# Patient Record
Sex: Male | Born: 1935 | Race: White | Hispanic: No | Marital: Single | Smoking: Never smoker
Health system: Southern US, Community
[De-identification: ages and names within clinical notes are randomized; demographics above are authoritative.]

## PROBLEM LIST (undated history)

## (undated) DIAGNOSIS — Z8739 Personal history of other diseases of the musculoskeletal system and connective tissue: Secondary | ICD-10-CM

## (undated) DIAGNOSIS — K210 Gastro-esophageal reflux disease with esophagitis: Secondary | ICD-10-CM

## (undated) DIAGNOSIS — F1010 Alcohol abuse, uncomplicated: Secondary | ICD-10-CM

## (undated) DIAGNOSIS — R269 Unspecified abnormalities of gait and mobility: Secondary | ICD-10-CM

## (undated) DIAGNOSIS — Z950 Presence of cardiac pacemaker: Secondary | ICD-10-CM

## (undated) DIAGNOSIS — G4700 Insomnia, unspecified: Secondary | ICD-10-CM

## (undated) DIAGNOSIS — J449 Chronic obstructive pulmonary disease, unspecified: Secondary | ICD-10-CM

## (undated) DIAGNOSIS — I10 Essential (primary) hypertension: Secondary | ICD-10-CM

## (undated) DIAGNOSIS — Z9181 History of falling: Secondary | ICD-10-CM

## (undated) DIAGNOSIS — K5900 Constipation, unspecified: Secondary | ICD-10-CM

## (undated) DIAGNOSIS — L600 Ingrowing nail: Secondary | ICD-10-CM

## (undated) DIAGNOSIS — I495 Sick sinus syndrome: Secondary | ICD-10-CM

## (undated) DIAGNOSIS — K219 Gastro-esophageal reflux disease without esophagitis: Secondary | ICD-10-CM

## (undated) DIAGNOSIS — I509 Heart failure, unspecified: Secondary | ICD-10-CM

## (undated) DIAGNOSIS — M1990 Unspecified osteoarthritis, unspecified site: Secondary | ICD-10-CM

## (undated) DIAGNOSIS — H3530 Unspecified macular degeneration: Secondary | ICD-10-CM

## (undated) DIAGNOSIS — L219 Seborrheic dermatitis, unspecified: Secondary | ICD-10-CM

## (undated) DIAGNOSIS — F32A Depression, unspecified: Secondary | ICD-10-CM

## (undated) DIAGNOSIS — I4891 Unspecified atrial fibrillation: Secondary | ICD-10-CM

## (undated) DIAGNOSIS — I319 Disease of pericardium, unspecified: Secondary | ICD-10-CM

## (undated) DIAGNOSIS — F329 Major depressive disorder, single episode, unspecified: Secondary | ICD-10-CM

## (undated) DIAGNOSIS — B429 Sporotrichosis, unspecified: Secondary | ICD-10-CM

## (undated) DIAGNOSIS — M545 Low back pain, unspecified: Secondary | ICD-10-CM

## (undated) DIAGNOSIS — M6281 Muscle weakness (generalized): Secondary | ICD-10-CM

## (undated) DIAGNOSIS — D7289 Other specified disorders of white blood cells: Secondary | ICD-10-CM

## (undated) DIAGNOSIS — N400 Enlarged prostate without lower urinary tract symptoms: Secondary | ICD-10-CM

## (undated) DIAGNOSIS — E785 Hyperlipidemia, unspecified: Secondary | ICD-10-CM

## (undated) DIAGNOSIS — I471 Supraventricular tachycardia: Secondary | ICD-10-CM

## (undated) DIAGNOSIS — I2510 Atherosclerotic heart disease of native coronary artery without angina pectoris: Secondary | ICD-10-CM

## (undated) DIAGNOSIS — T7840XA Allergy, unspecified, initial encounter: Secondary | ICD-10-CM

## (undated) DIAGNOSIS — H409 Unspecified glaucoma: Secondary | ICD-10-CM

## (undated) DIAGNOSIS — M71339 Other bursal cyst, unspecified wrist: Secondary | ICD-10-CM

## (undated) HISTORY — DX: Depression, unspecified: F32.A

## (undated) HISTORY — DX: Sporotrichosis, unspecified: B42.9

## (undated) HISTORY — PX: CARPAL TUNNEL RELEASE: SHX101

## (undated) HISTORY — DX: Low back pain, unspecified: M54.50

## (undated) HISTORY — DX: Low back pain: M54.5

## (undated) HISTORY — PX: APPENDECTOMY: SHX54

## (undated) HISTORY — DX: Disease of pericardium, unspecified: I31.9

## (undated) HISTORY — PX: ELBOW SURGERY: SHX618

## (undated) HISTORY — PX: OTHER SURGICAL HISTORY: SHX169

## (undated) HISTORY — DX: Hyperlipidemia, unspecified: E78.5

## (undated) HISTORY — DX: Muscle weakness (generalized): M62.81

## (undated) HISTORY — DX: History of falling: Z91.81

## (undated) HISTORY — DX: Sick sinus syndrome: I49.5

## (undated) HISTORY — DX: Other bursal cyst, unspecified wrist: M71.339

## (undated) HISTORY — DX: Ingrowing nail: L60.0

## (undated) HISTORY — DX: Chronic obstructive pulmonary disease, unspecified: J44.9

## (undated) HISTORY — DX: Alcohol abuse, uncomplicated: F10.10

## (undated) HISTORY — PX: PACEMAKER INSERTION: SHX728

## (undated) HISTORY — DX: Atherosclerotic heart disease of native coronary artery without angina pectoris: I25.10

## (undated) HISTORY — DX: Insomnia, unspecified: G47.00

## (undated) HISTORY — DX: Constipation, unspecified: K59.00

## (undated) HISTORY — DX: Supraventricular tachycardia: I47.1

## (undated) HISTORY — DX: Presence of cardiac pacemaker: Z95.0

## (undated) HISTORY — DX: Other specified disorders of white blood cells: D72.89

## (undated) HISTORY — PX: REPLACEMENT TOTAL KNEE BILATERAL: SUR1225

## (undated) HISTORY — DX: Unspecified atrial fibrillation: I48.91

## (undated) HISTORY — DX: Seborrheic dermatitis, unspecified: L21.9

## (undated) HISTORY — DX: Unspecified macular degeneration: H35.30

## (undated) HISTORY — DX: Unspecified osteoarthritis, unspecified site: M19.90

## (undated) HISTORY — DX: Unspecified abnormalities of gait and mobility: R26.9

## (undated) HISTORY — DX: Major depressive disorder, single episode, unspecified: F32.9

## (undated) HISTORY — DX: Gastro-esophageal reflux disease without esophagitis: K21.9

## (undated) HISTORY — DX: Allergy, unspecified, initial encounter: T78.40XA

## (undated) HISTORY — DX: Gastro-esophageal reflux disease with esophagitis: K21.0

## (undated) HISTORY — DX: Essential (primary) hypertension: I10

## (undated) HISTORY — DX: Personal history of other diseases of the musculoskeletal system and connective tissue: Z87.39

## (undated) HISTORY — PX: ANGIOPLASTY: SHX39

## (undated) HISTORY — DX: Unspecified glaucoma: H40.9

## (undated) HISTORY — DX: Heart failure, unspecified: I50.9

---

## 1973-12-26 HISTORY — PX: YAG LASER APPLICATION: SHX6189

## 1976-05-19 HISTORY — PX: HERNIA REPAIR: SHX51

## 1983-07-27 HISTORY — PX: TRANSURETHRAL RESECTION OF PROSTATE: SHX73

## 1985-08-14 HISTORY — PX: CYSTOSCOPY: SUR368

## 1985-09-17 HISTORY — PX: EPIDIDYMIS SURGERY: SHX843

## 1996-02-07 HISTORY — PX: SHOULDER OPEN ROTATOR CUFF REPAIR: SHX2407

## 1996-10-30 HISTORY — PX: CATARACT EXTRACTION: SUR2

## 1999-02-24 ENCOUNTER — Ambulatory Visit (HOSPITAL_COMMUNITY): Admission: RE | Admit: 1999-02-24 | Discharge: 1999-02-24 | Payer: Self-pay | Admitting: Gastroenterology

## 1999-03-01 ENCOUNTER — Encounter: Payer: Self-pay | Admitting: Neurology

## 1999-03-01 ENCOUNTER — Encounter: Admission: RE | Admit: 1999-03-01 | Discharge: 1999-03-01 | Payer: Self-pay | Admitting: Neurology

## 1999-04-28 ENCOUNTER — Ambulatory Visit (HOSPITAL_COMMUNITY): Admission: RE | Admit: 1999-04-28 | Discharge: 1999-04-28 | Payer: Self-pay | Admitting: Neurology

## 2000-03-02 ENCOUNTER — Encounter: Payer: Self-pay | Admitting: Gastroenterology

## 2000-03-02 ENCOUNTER — Encounter: Admission: RE | Admit: 2000-03-02 | Discharge: 2000-03-02 | Payer: Self-pay | Admitting: Gastroenterology

## 2001-01-17 ENCOUNTER — Encounter: Payer: Self-pay | Admitting: Orthopedic Surgery

## 2001-01-17 ENCOUNTER — Encounter: Admission: RE | Admit: 2001-01-17 | Discharge: 2001-01-17 | Payer: Self-pay | Admitting: Orthopedic Surgery

## 2001-02-15 ENCOUNTER — Encounter: Payer: Self-pay | Admitting: Orthopedic Surgery

## 2001-02-15 ENCOUNTER — Encounter: Admission: RE | Admit: 2001-02-15 | Discharge: 2001-02-15 | Payer: Self-pay | Admitting: Orthopedic Surgery

## 2001-03-07 ENCOUNTER — Encounter: Payer: Self-pay | Admitting: Orthopedic Surgery

## 2001-03-11 ENCOUNTER — Encounter (INDEPENDENT_AMBULATORY_CARE_PROVIDER_SITE_OTHER): Payer: Self-pay

## 2001-03-11 ENCOUNTER — Encounter: Payer: Self-pay | Admitting: Orthopedic Surgery

## 2001-03-11 HISTORY — PX: BACK SURGERY: SHX140

## 2001-03-12 ENCOUNTER — Inpatient Hospital Stay (HOSPITAL_COMMUNITY): Admission: RE | Admit: 2001-03-12 | Discharge: 2001-03-13 | Payer: Self-pay | Admitting: Orthopedic Surgery

## 2001-04-30 ENCOUNTER — Ambulatory Visit (HOSPITAL_COMMUNITY): Admission: RE | Admit: 2001-04-30 | Discharge: 2001-05-01 | Payer: Self-pay | Admitting: Ophthalmology

## 2001-04-30 HISTORY — PX: YAG LASER APPLICATION: SHX6189

## 2001-05-07 ENCOUNTER — Ambulatory Visit (HOSPITAL_COMMUNITY): Admission: RE | Admit: 2001-05-07 | Discharge: 2001-05-07 | Payer: Self-pay | Admitting: Ophthalmology

## 2001-05-07 HISTORY — PX: YAG LASER APPLICATION: SHX6189

## 2001-08-26 HISTORY — PX: SIGMOIDOSCOPY: SUR1295

## 2002-08-28 ENCOUNTER — Encounter: Payer: Self-pay | Admitting: Orthopedic Surgery

## 2002-08-28 ENCOUNTER — Encounter: Admission: RE | Admit: 2002-08-28 | Discharge: 2002-08-28 | Payer: Self-pay | Admitting: Orthopedic Surgery

## 2003-04-25 HISTORY — PX: CORONARY ARTERY BYPASS GRAFT: SHX141

## 2003-04-25 HISTORY — PX: TOE SURGERY: SHX1073

## 2003-08-10 ENCOUNTER — Encounter: Admission: RE | Admit: 2003-08-10 | Discharge: 2003-08-10 | Payer: Self-pay | Admitting: Orthopedic Surgery

## 2003-08-11 ENCOUNTER — Ambulatory Visit: Admission: RE | Admit: 2003-08-11 | Discharge: 2003-08-11 | Payer: Self-pay | Admitting: Orthopedic Surgery

## 2003-08-11 ENCOUNTER — Ambulatory Visit (HOSPITAL_BASED_OUTPATIENT_CLINIC_OR_DEPARTMENT_OTHER): Admission: RE | Admit: 2003-08-11 | Discharge: 2003-08-11 | Payer: Self-pay | Admitting: Orthopedic Surgery

## 2003-08-20 ENCOUNTER — Inpatient Hospital Stay (HOSPITAL_COMMUNITY): Admission: AD | Admit: 2003-08-20 | Discharge: 2003-08-24 | Payer: Self-pay | Admitting: Gastroenterology

## 2003-08-27 ENCOUNTER — Inpatient Hospital Stay (HOSPITAL_COMMUNITY): Admission: EM | Admit: 2003-08-27 | Discharge: 2003-09-04 | Payer: Self-pay | Admitting: Emergency Medicine

## 2003-08-27 ENCOUNTER — Encounter (INDEPENDENT_AMBULATORY_CARE_PROVIDER_SITE_OTHER): Payer: Self-pay | Admitting: Cardiology

## 2003-09-28 ENCOUNTER — Ambulatory Visit (HOSPITAL_COMMUNITY): Admission: RE | Admit: 2003-09-28 | Discharge: 2003-09-28 | Payer: Self-pay | Admitting: Thoracic Surgery

## 2003-09-29 ENCOUNTER — Encounter: Admission: RE | Admit: 2003-09-29 | Discharge: 2003-09-29 | Payer: Self-pay | Admitting: Thoracic Surgery

## 2003-10-07 ENCOUNTER — Inpatient Hospital Stay (HOSPITAL_COMMUNITY): Admission: EM | Admit: 2003-10-07 | Discharge: 2003-10-15 | Payer: Self-pay | Admitting: Emergency Medicine

## 2003-10-27 ENCOUNTER — Encounter
Admission: RE | Admit: 2003-10-27 | Discharge: 2003-10-27 | Payer: Self-pay | Admitting: Thoracic Surgery (Cardiothoracic Vascular Surgery)

## 2003-11-02 ENCOUNTER — Encounter (HOSPITAL_COMMUNITY): Admission: RE | Admit: 2003-11-02 | Discharge: 2004-01-31 | Payer: Self-pay | Admitting: Interventional Cardiology

## 2005-04-24 HISTORY — PX: CARPAL TUNNEL RELEASE: SHX101

## 2005-08-03 ENCOUNTER — Encounter: Admission: RE | Admit: 2005-08-03 | Discharge: 2005-08-03 | Payer: Self-pay | Admitting: Orthopedic Surgery

## 2005-08-08 ENCOUNTER — Ambulatory Visit (HOSPITAL_BASED_OUTPATIENT_CLINIC_OR_DEPARTMENT_OTHER): Admission: RE | Admit: 2005-08-08 | Discharge: 2005-08-08 | Payer: Self-pay | Admitting: Orthopedic Surgery

## 2006-04-24 HISTORY — PX: ROTATOR CUFF REPAIR: SHX139

## 2006-09-10 ENCOUNTER — Encounter: Admission: RE | Admit: 2006-09-10 | Discharge: 2006-09-10 | Payer: Self-pay | Admitting: Orthopedic Surgery

## 2006-11-09 ENCOUNTER — Inpatient Hospital Stay (HOSPITAL_COMMUNITY): Admission: RE | Admit: 2006-11-09 | Discharge: 2006-11-10 | Payer: Self-pay | Admitting: Orthopedic Surgery

## 2007-10-15 ENCOUNTER — Encounter: Admission: RE | Admit: 2007-10-15 | Discharge: 2007-10-15 | Payer: Self-pay | Admitting: Orthopedic Surgery

## 2007-11-01 ENCOUNTER — Encounter: Admission: RE | Admit: 2007-11-01 | Discharge: 2007-11-01 | Payer: Self-pay | Admitting: Orthopedic Surgery

## 2009-09-29 ENCOUNTER — Ambulatory Visit (HOSPITAL_COMMUNITY): Admission: RE | Admit: 2009-09-29 | Discharge: 2009-09-29 | Payer: Self-pay | Admitting: Orthopedic Surgery

## 2010-09-06 NOTE — Op Note (Signed)
NAMECASPAR, Caleb Nash                ACCOUNT NO.:  [id number]   MEDICAL RECORD NO.:  [id number]          PATIENT TYPE:  AMB   LOCATION:  DAY                          FACILITY:  [facility]   PHYSICIAN:  Marlowe Kays, M.D.  DATE OF BIRTH:  [birth year]   DATE OF PROCEDURE:  11/08/2006  DATE OF DISCHARGE:                               OPERATIVE REPORT   PREOPERATIVE DIAGNOSIS:  Rotator cuff tear, right shoulder.   POSTOPERATIVE DIAGNOSIS:  Rotator cuff tear, right shoulder.   OPERATION:  Anterior acromionectomy with repair of torn rotator cuff and  application of graft jacket, right shoulder.   SURGEON:  Marlowe Kays, M.D.   ASSISTANTDruscilla Brownie. Underwood, P.A.-C.   ANESTHESIA:  General preceded by interscalene block.   PATHOLOGY:  He has had a successful rotator cuff repair on the left  shoulder by me years ago.  His right shoulder we made a diagnosis with  an arthrogram. He has a pacemaker. We also had preoperative clearance  from his cardiologist and medical physician prior to the surgery.   PROCEDURE:  Prophylactic antibiotics, satisfactory general anesthesia,  beach chair position on the Miller frame, right shoulder girdle was  prepped with DuraPrep and draped in a sterile field.  A time out  employed, [place] used. A vertical incision over roughly the [facility] joint down  to the greater tuberosity.  I opened the fascia over the anterior  acromion with the cutting cautery and undermined the anterior acromion  and had a platform beaking anteriorly.  I made my first anterior  acromionectomy with a microsaw and then further decompressed the rotator  cuff with the microsaw until there was no residual impingement and  rotator cuff pathology was noted. He had significantly abraded a good  bit of the rotator cuff, inflated in one portion. There was a upside  down V type tear, roughly 2 cm x 2 cm.  I first repaired the area with  the multiple interrupted #1 Ethibond and then roughened up the  already  corrugated humeral head and used two rotator cuff anchors stabilizing  the rotator cuff lateral ward.  I supplemented this with some individual  sutures of #1 Ethibond. Because of the abrasion. I used a graft jacket  placed on top of the abraded area to bolster up the strength of the  rotator cuff.  I utilized multiple interrupted 2-0 Vicryl sutures and  also flayed the graft with a 15 knife blade to allow exudate exit.  The  wound was then irrigated with sterile saline.  A small slit in the  deltoid muscle was repaired with interrupted #1 Vicryl as was the fascia  over the anterior acromion, subcu tissue was closed with 2-0 Vicryl,  Steri-Strips on the skin.  Dry sterile dressing and shoulder immobilizer  applied.  He tolerated the procedure well and was taken to the recovery  room in satisfactory condition with no known complications.          ______________________________  Marlowe Kays, M.D.    JA/MEDQ  D:  11/08/2006  T:  11/08/2006  Job:  [id number]

## 2010-09-06 NOTE — Discharge Summary (Signed)
Caleb Nash, Caleb Nash NO.:  [id number]   MEDICAL RECORD NO.:  [id number]          PATIENT TYPE:  INP   LOCATION:  1522                         FACILITY:  [facility]   PHYSICIAN:  Marlowe Kays, M.D.  DATE OF BIRTH:  1914-01-01   DATE OF ADMISSION:  11/08/2006  DATE OF DISCHARGE:  11/10/2006                               DISCHARGE SUMMARY   ADMISSION DIAGNOSES:  1. Right rotator cuff tear.  2. Hypertension.  3. Benign prostatic hypertrophy.  4. Hypercholesterolemia.   DISCHARGE DIAGNOSES:  1. Right rotator cuff tear.  2. Hypertension.  3. Benign prostatic hypertrophy.  4. Hypercholesterolemia.   OPERATIONS:  Right open rotator cuff repair with graft jacket  application.   SURGEON:  Marlowe Kays, M.D.   ASSESSMENT:  Caleb Nash. Caleb Nash.   ANESTHESIA:  General.   BRIEF HISTORY:  Caleb Nash is a 77 year old gentleman who has had ongoing  right shoulder pain with an arthrogram, demonstrated rotator cuff tear.  He had previously done well several years ago with left shoulder rotator  cuff tear.  His cardiologist saw him, provided medical clearance, and  therefore, he was felt to be stable to tolerate right shoulder surgery  as above.   HOSPITAL COURSE:  The patient was admitted and underwent the above  procedure and tolerated this well.  All appropriate IV antibiotics and  analgesics were utilized.  Postoperatively, he was placed in a sling, no  range of motion to his right shoulder.  ADLs only.  He did have a  significant postoperative pain requiring an additional day.  However, on  November 10, 2006, he was in much better pain control on oral analgesics.  He was a resident at a friend's home, and arrangements were made to  discharge him to an assisted level status.  This is being dictated in  anticipation for discharge today.  His incision was clean and cry.  He  was neurovascularly intact.  He was stable for discharge.   LABORATORY DATA:  This is  being dictated remotely, please see hospital  chart for details.   DISCHARGE MEDICATIONS/PLANS:  The patient is being discharged to  assisted level care and should continue his sling with range of motion  to his right shoulder, daily dressing changes.  May use his elbow wrist  and hand just to assist with feeding.  He is on the following  medications.  1. Lisinopril 20/12.5 mg daily.  2. Vytorin 10/20 daily.  3. Prilosec 20 mg daily.  4. Flomax 0.5 mg q.h.s.  5. Zoloft 50 mg q.a.m.  6. Ambien 5 mg q.h.s.  7. Claritin 10 mg daily as needed.  8. Tramadol 50 mg one every 4-6 h p.r.n. pain.  9. Percocet 1-2 q.4-6 h p.r.n. pain.  10.Robaxin 500 mg one every 8 hours p.r.n. spasm.   DISCHARGE INSTRUCTIONS:  1. He is on regular diet.  2. Resume other activity levels.  3. Follow up in the office in two weeks.   CONDITION ON DISCHARGE:  Stable and improved.      Caleb Nash, P.A.-C.    ______________________________  Marlowe Kays, M.D.    TAS/MEDQ  D:  11/10/2006  T:  11/10/2006  Job:  [id number]

## 2010-09-09 NOTE — Discharge Summary (Signed)
[facility]  Patient:    Caleb Nash, Caleb Nash Visit Number: [id number] MRN: [id number]          Service Type: SUR Location: 4W 0445 02 Attending Physician:  Marlowe Kays Page Admit Date:  03/11/2001                             Discharge Summary  ADDENDUM:  CURRENT MEDICATIONS:  1. Claritin 10 mg q.d.  2. Flomax 0.4 mg q.d.  3. Zoloft 50 mg q.d.  4. Protonix 40 mg q.d.  5. Diltiazem 180 mg q.d. CD.  6. Aspirin q.d.  7. Lopressor 25 mg q.d.  8. Ambien 10 mg q.h.s. p.r.n.  9. Percocet 1-2 every four hours p.r.n. pain.  I gave a prescription for #30. 10. Robaxin 500 mg #30, 1 every eight hours p.r.n. spasm.  I gave a     prescription for 30. 11. Milk of magnesia 30 mL p.r.n.  DIET:  Regular.  ACTIVITY:  Physical therapy for gait training and ambulation.  Back precautions. Attending Physician:  Joaquin Courts DD:  03/13/01 TD:  03/13/01 Job: [id number]

## 2010-09-09 NOTE — Discharge Summary (Signed)
NAME:  Caleb Nash, BEDEL NO.:  [id number]   MEDICAL RECORD NO.:  [id number]                   PATIENT TYPE:  INP   LOCATION:  3733                                 FACILITY:  [facility]   PHYSICIAN:  Tasia Catchings, M.D.                DATE OF BIRTH:  1914/01/20   DATE OF ADMISSION:  08/20/2003  DATE OF DISCHARGE:  08/24/2003                                 DISCHARGE SUMMARY   DISCHARGE DIAGNOSES:  1. Arteriosclerotic heart disease status post percutaneous transluminal     coronary angioplasty in 1989 with accelerating angina but a negative     Cardiolite study and no signs of significant wall motion abnormality,     reduction in ejection fraction or enzyme increase, improved with     increasing medication.  2. Arteriosclerotic cerebrovascular disease.  3. Depression.  4. Status post recent carpal tunnel and ulnar decompression surgery on left.  5. Irritable bowel syndrome.  6. Degenerative joint disease.  7. Insomnia.  8. Gastroesophageal reflux disease.  9. Benign prostatic hypertrophy.  10.      Erectile dysfunction.   DISCHARGE MEDICATIONS:  1. Nitroglycerin patch 0.4 mg per hour, on at 7 a.m., off at bedtime.  2. Lopressor 25 mg twice a day.  3. Mobic 7.5 mg daily.  4. Flomax 0.4 mg daily.  5. Prinvil 5 mg daily.  6. Claritin 10 mg daily.  7. Zoloft 50 mg daily.  8. Prilosec 20 mg daily.  9. Enteric coated aspirin 81 mg daily.  10.      Ambien 10 mg at bedtime as needed.  11.      Sudafed as needed.   PAIN MANAGEMENT:  Is not applicable.   ACTIVITY:  Up as tolerated.   DIET:  No added salt.   WOUND CARE:  Not applicable, however patient is in the process of recovery  from his orthopedic surgery.   FOLLOW UP:  With me, is in six weeks.   BRIEF HISTORY:  Caleb Nash is an 77 year old gentleman who had elective arm  surgery one week prior to admission and developed two days worth of  exertional chest tightness which occurred on the day of  admission walking in  from the parking lot to my office.  EKG at the time the discomfort was going  away showed a partial right bundle branch block and possible coronary  insufficiency.  He was admitted to the hospital for further evaluation.   PHYSICAL EXAM AT THE TIME OF ADMISSION:  Showed on signs of congestive heart  failure and were basically normal with the exception of ecchymosis in his  left arm from recent surgery.   LABORATORY DATA:  The initial CBC was normal with a hemoglobin of 15.9 and a  white count of 6.9.  This was repeated once and remained normal.  He does  have a chronically elevated MCV with a normal B12.  His pro time was normal.  Chemistry tests were normal as well, including CK MBs.  Troponins were 0.1,  0.06 and 0.04 every 12 hours for three doses.   X-RAY RESULTS:  Patient had a chest x-ray that showed COPD but no active  disease and a Cardizem study which was negative for ischemia or wall motion  abnormalities.   HOSPITAL COURSE:  The patient was placed in the CCU and followed closely by  Dr. Verdis Prime of [facility].  After an MI was ruled out, he  underwent the stress Cardiolite study which was essentially negative.  Nitroglycerin patch was added on admission and increased and his Lopressor  was increased as well.  He developed a very mild but asymptomatic  bradycardia and was able to ambulate without further trouble, being  discharged after ambulation.                                                Tasia Catchings, M.D.    JW/MEDQ  D:  08/24/2003  T:  08/24/2003  Job:  [id number]   cc:   Lesleigh Noe, M.D.  [address]  [address]  [place]  [place] [address]  Fax: [phone]

## 2010-09-09 NOTE — Op Note (Signed)
NAME:  Caleb Nash, Caleb Nash NO.:  [id number]   MEDICAL RECORD NO.:  [id number]                   PATIENT TYPE:  INP   LOCATION:  2032                                 FACILITY:  [facility]   PHYSICIAN:  Francisca December, M.D.               DATE OF BIRTH:  [birth year]   DATE OF PROCEDURE:  10/14/2003  DATE OF DISCHARGE:                                 OPERATIVE REPORT   PROCEDURE PERFORMED:  1. Insertion permanent dual chamber transvenous pacemaker.  2. Right subclavian venogram.   INDICATIONS FOR PROCEDURE:  Mr. Trindon Dorton is a 77 year old man who is  now approximately two to three months, status post coronary artery bypass  grafting.  Since his procedure, he has had sinus bradycardia.  This is  recently worsened into marked bradycardia with intermittent sinus arrest and  asystole lasting up to 3.5 seconds.  The patient initially was being treated  with amiodarone for atrial fibrillation.  This has been discontinued with  persistence of this atrial bradycardia.  The patient requires additional  medications including beta blockers for his postoperative state and  hypertension.  He is brought now to the cardiac catheterization laboratory  for insertion of dual chamber permanent transvenous pacemaker with the  diagnosis of sinus node dysfunction.   DESCRIPTION OF PROCEDURE:  The patient was brought to the cardiac  catheterization laboratory in a fasting state.  The right prepectoral region  was prepped and draped in the usual sterile fashion.  Local anesthesia was  obtained with infiltration of 1% lidocaine with epinephrine throughout.  A  right subclavian venogram was then performed with a peripheral injection of  20 mL of Omnipaque.  A digital cine AP angiogram was obtained and  subsequently roadmapped to guide future right subclavian puncture.  It did  demonstrate the right subclavian vein to be widely patent and coursing in a  normal fashion over the anterior  surface of the first rib and beneath the  middle third of the clavicle.   A 6 to 7 cm incision was made in the deltopectoral groove and this was  carried down by electrocautery and sharp dissection to the prepectoral  fascia.  There a plane was lifted and a pocket formed inferiorly and  medially utilizing blunt dissection.  The pocket was then packed with a 1%  Kanamycin soaked gauze.  Two separate right subclavian punctures were then  performed using an 18 gauge thin walled needle through which was passed a  0.038 inch tight J guidewire.  Over the initial guidewire a 7 French tear-  away sheath and dilator were advanced.  The dilator and wire were removed.  The right ventricular lead was advanced to the level of the right atrium and  the sheath was torn away.  Using standard technique and fluoroscopic  landmarks, the lead was manipulated into the right ventricular apex but  slightly high on the interventricular  septum.  There, excellent pacing  parameters were obtained that will be noted below.  These remained stable  for five to 10 minutes before the lead was sutured into place.  It was  sutured into place with three separate 0 silk ligatures.  It was tested for  diaphragmatic pacing at 10 V and none was found.  Over the remaining  guidewire a 9 French tear-away sheath and dilator was advanced.  The dilator  was removed.  The wire was allowed to remain in place.  The right atrial  lead was advanced to the level of the right atrium.  The sheath was torn  away.  The lead was then manipulated into the right atrial lateral free  wall.  There excellent pacing parameters were obtained as will be noted  below.  The lead was an active fixation device and the screw was advanced  under fluoroscopy appropriately.  It was tested for security as the J-wire  was removed.  The lead was tested for diaphragmatic pacing at 10 V.  The  lead was then sutured into place again using three separate 0 silk   ligatures.  The Kanamycin soaked gauze and the remaining wire were removed  from the pocket.  The pocket was copiously irrigated with 1% Kanamycin  solution.  The pocket was inspected for bleeding and none was found.  A  figure-of-eight hemostasis suture was placed around the lead insertion site.  The leads were connected to the pacing device under the direction of the  [employer] representative.  Each lead was identified by a serial number and  placed in the appropriate receptacle.  Each lead was tightened into place  appropriately and tested for security.  The leads were then wound beneath  the pacing generator and the generator was placed in the pocket.  The pocket  was then closed using 2-0 running Vicryl in a running fashion for the  subcutaneous layer.  The skin was approximated using 5-0 Vicryl in a running  subcuticular fashion.  Steri-Strips and a sterile dressing were applied and  the patient was transported to the recovery area in stable condition in an A-  sense, V-sense mode.   EQUIPMENT DATA:  The pacing generator is a [employer]tronic Enpulse, model number  [id number], serial number [id number].  The atrial lead is a [employer]onic model  number [id number], serial number [id number].  The ventricular lead is a Med[employer]onic  model number [id number], serial number [id number].   PACING DATA:  Ventricular lead detected a 17.1 mV R-wave.  The pacing  threshold was 0.7 V at 0.5 msec.  The impedance was 611 ohms.  This resulted  in a current at capture threshold of 1.4 mA.  The atrial lead detected a 4.8  mV P-wave.  The pacing threshold was 0.4 V at 0.5 msec pulse width.  The  impedance was 598 ohms resulting in a current at capture threshold of 1.0  mA.                                               Francisca December, M.D.    JHE/MEDQ  D:  10/14/2003  T:  10/15/2003  Job:  [id number]   cc:   Lyn Records III, M.D.  [address]  [address]  [place]  [place] [address] Fax: [phone]   Evelene Croon, M.D.  [address]  [place]  [place] [address]  Fax: [phone]

## 2010-09-09 NOTE — Discharge Summary (Signed)
NAME:  Caleb Nash, Caleb Nash NO.:  [id number]   MEDICAL RECORD NO.:  [id number]                   PATIENT TYPE:  INP   LOCATION:  2032                                 FACILITY:  [facility]   PHYSICIAN:  Salvatore Decent. Dorris Fetch, M.D.         DATE OF BIRTH:  03-Oct-1913   DATE OF ADMISSION:  10/07/2003  DATE OF DISCHARGE:  10/13/2003                                 DISCHARGE SUMMARY   ADMISSION DIAGNOSIS:  Recurrent left pleural effusion status post coronary  artery bypass grafting x5 on Aug 27, 2003.   ADDITIONAL DIAGNOSES/DISCHARGE DIAGNOSES:  1. Large, recurrent left pleural effusion status post left chest tube     placement for relief of the effusion on October 07, 2003.  2. Status post coronary artery bypass grafting x5 on Aug 27, 2003, for severe     coronary artery disease.  3. Status post recent left needle thoracentesis on September 28, 2003.  4. Hypertension.  5. Gastroesophageal reflux disease.  6. Irritable bowel syndrome.  7. Benign prostatic hypertrophy.  8. Carpal tunnel release in April 2005.  9. History of back surgery.  10.      Bilateral total knee replacements.  11.      Left  rotator cuff repair.  12.      History of asymptomatic bradycardia which was present prior to     coronary artery bypass graft surgery.   HOSPITAL PROCEDURE/MANAGEMENT:  1. Placement of left-sided pleural chest tube for drainage of left pleural     effusion.  2. Multiple chest x-rays for evaluation of effusion.   CONSULTATIONS:  1. Electrophysiology consultation.  2. Cardiology.  3. Case Production designer, theatre/television/film.   HISTORY OF PRESENT ILLNESS:  Caleb Nash is an 77 year old male with a history  of coronary artery disease, status post recent coronary artery bypass  grafting.  The patient has been noted to have a recurrent left pleural  effusion.  The patient had a thoracentesis in early June 2005.  Unfortunately, the patient had recurrence of the left pleural effusion with  atypical left-sided  chest pain and shortness of breath.  The patient was  seen in the emergency department by Dr. Cornelius Moras of [facility] on October 07, 2003.  He  felt the patient should be admitted for relief of a large recurrent left-  sided pleural effusion.   HOSPITAL COURSE:  Caleb Nash was then admitted electively to [facility] on October 07, 2003, for a large, recurrent left-sided  pleural effusion.  The patient underwent placement of a left-sided pleural  chest tube by Dr. Cornelius Moras of [facility].  Approximately 1750 mL of thin  serosanguineous fluid was relieved immediately from the chest cavity.  The  left-sided chest tube remained in place during the patient's hospitalization  with significant output for several days.   Overall, during the patient's hospitalization, he remained stable.  We  completed serial chest x-rays to follow the left-sided  pleural effusion and  any other pulmonary issues that may have occurred.  Ultimately, the chest  tube output diminished over time.  Once it was an acceptable, less than 200  mL over a 24-hour period, the chest tube was removed.  Follow-up chest x-  rays are still pending at the time of this dictation.  The patient is doing  quite well, ambulating in the hallways without difficulty on room air.  During this hospitalization, the patient was found to have significant  bradycardia.  He has had history of bradycardia even prior to his coronary  artery bypass graft surgery.  Although we felt that it was appropriate that  the patient undergo a cardiology and possible electrophysiology  consultation.  The patient was seen by Dr. Mayford Knife in the absence of Dr.  Katrinka Blazing, his regular cardiologist.  Her impression was that the patient has  asymptomatic bradycardia and the need for permanent pacemaker at this time  was not indicated.  Plan was for the patient to follow up as an outpatient  with Dr. Katrinka Blazing for any further evaluation.  The patient continued to be  asymptomatic and  his heart rate actually increased over the next several  days to a rate of 60s consistently.  His blood pressure remained stable at  160/80s.   The patient was deemed appropriate for initiation of discharge planning on  hospital day #6 or October 12, 2003.  As mentioned above, the patient's chest  tube output had significantly decreased to less than 200 mL over a 24-hour  period.  His chest tube was discontinued and the patient had no residual  symptoms.  He remained in the mid 90s to 100 saturations on room air.  His  heart rate was in the 50s to 60s reading a sinus rhythm to occasional  junctional rhythm.  The patient had resumed normal bowel and bladder  function.  His lung sounds revealed slightly diminished sounds on the bases,  otherwise were clear.  His abdomen was benign.  Extremities were without  edema.  Chest tube site was clean, dry and intact without evidence of air  leak.   We will obtain a follow-up PA and lateral chest x-ray in the a.m. of October 13, 2003.  Once this is evaluated and if the patient's status is stable, we  will continue his plan with discharge at that time.   DISCHARGE MEDICATIONS:  1. Flomax 0.4 mg daily.  2. Zoloft 25 mg daily (questionable home dose of 50 mg daily).  3. Protonix 40 mg daily.  4. Enteric coated aspirin 325 mg daily.  5. Prinivil 10 mg daily.  6. Ambien 5 mg each night as needed for sleep.  7. Vitorin 10/20 mg daily.  8. Lasix 40 mg daily (until seen by Dr. Dorris Fetch).  9. Potassium 20 mEq daily (until seen by Dr. Dorris Fetch).  10.      The patient is instructed to continue to hold his Lopressor and     Mobic until seen by Dr. Katrinka Blazing in clinic.  11.      Ultram 50 mg one or two tablets q.4-6h. p.r.n. pain.   DISCHARGE INSTRUCTIONS:  1. Activity:  The patient should avoid driving or heavy lifting until seen     by Dr. Dorris Fetch.  The patient should continue to walk daily. 2. Diet:  The patient should follow a heart-healthy,  well-balanced diet.  3. Wound care:  The patient may shower.  He should wash his incisions daily  with soap and water.  4. Special instructions:  The patient is to notify the [facility] office if he has     any signs or symptoms of chest pain, shortness of breath, fevers, chills,     or night sweats.   FOLLOW UP:  1. The patient is to see Dr. Katrinka Blazing on Monday, November 02, 2003 at 2:30 p.m.  2. The patient is to see Dr. Dorris Fetch within seven to 10 days of     discharge.  He will also undergo a chest x-ray at [facility] on that day.  The [facility] office will call the patient with the exact     date and time.      Carolyn A. Eustaquio Boyden.                  Salvatore Decent Dorris Fetch, M.D.    CAF/MEDQ  D:  10/12/2003  T:  10/14/2003  Job:  [id number]   cc:   Lesleigh Noe, M.D.  [address]  [address]  [place]  [place] [address]  Fax: [phone]

## 2010-09-09 NOTE — H&P (Signed)
NAME:  Caleb, Nash NO.:  [id number]   MEDICAL RECORD NO.:  [id number]                   PATIENT TYPE:  EMS   LOCATION:  MAJO                                 FACILITY:  [facility]   PHYSICIAN:  Quita Skye. Waldon Reining, MD             DATE OF BIRTH:  01/14/1914   DATE OF ADMISSION:  08/27/2003  DATE OF DISCHARGE:                                HISTORY & PHYSICAL   IDENTIFYING DATA AND CHIEF COMPLAINT:  Caleb Nash is an 77 year old white  man who is admitted to [facility] for what appears to be an acute  non-ST segment elevation myocardial infarction.   HISTORY OF PRESENT ILLNESS:  The patient has a history of coronary artery  disease, which dates back to 88.  At that time he underwent PTCA.  He was  hospitalized here last week for accelerating angina.  An adenosine  Cardiolite study was negative for ischemia, however, and he was discharged.  He returned to the emergency department tonight after experiencing an  episode of chest pain.  This began this evening while he was sitting.  The  chest pain is described as a lower substernal pressure.  It did not radiate.  It was associated with mild dyspnea, but no diaphoresis or nausea.  There  are no exacerbating or ameliorating factors.  It appears not to be related  to position, activity, meals or respirations.  EMS was ultimately summoned.  Nitroglycerin was administered upon their arrival, which resulted in  resolution of his chest pain.  He was transported to the emergency  department and had no further chest pain upon his arrival.  The total  duration of chest pain was approximately three hours.  He did not take any  nitroglycerin at home.  He is free of chest pain at this time.   Th notes that he had an episode of chest pain yesterday morning as well as  the morning prior.  Tonight's episode of chest pain was more intense and  more prolonged than the prior episodes.   PAST MEDICAL HISTORY:  The  patient has no history of congestive heart  failure or arrhythmia.   Of note, the patient recently underwent a left carpal tunnel release.   OTHER MEDICAL PROBLEMS:  Other medical problems include:  1. Hypertension.  2. Gastroesophageal reflux.  3. Irritable bowel syndrome.  4. Benign prostatic hypertrophy.   ALLERGIES:  The patient is reportedly allergic to SULFA, VIOXX and CELEBREX.   MEDICATIONS:  The patient's current medications include:  1. Metoprolol 12.5 mg p.o. b.i.d.  2. Enteric coated aspirin 81 mg p.o. daily.  3. Prinivil 5 mg p.o. daily.  4. Claritin 10 mg p.o. wd.  5. Zoloft 50 mg p.o. daily.  6. Prilosec 20 mg p.o. daily.  7. Ambien 10 mg p.o. h.s. p.r.n.  8. Flomax 0.4 mg p.o. daily.  9. Mobic 7.5 mg p.o. daily.  10.  Nitroglycerin patch 0.4 mg an hour on A.M. and off at bedtime.  11.      Cardizem CD 180 mg p.o. daily.   SOCIAL HISTORY:  The patient lives at [facility].  He does  not smoke.   PAST SURGICAL HISTORY:  Previous operations include:  1. Bilateral total knee replacements.  2. Left rotator cuff repair.  3. Back surgery.  4. The aforementioned carpal tunnel release.   FAMILY HISTORY:  Family history is noncontributory.   REVIEW OF SYSTEMS:  Review of systems reveals no problems related to her  head, eyes, ears, nose, mouth, throat, lungs, gastrointestinal system,  genitourinary system, or extremities.  There is no history of neurologic or  psychiatric disorder.  There is no history of fever, chills or weight loss.   PHYSICAL EXAMINATION:  VITAL SIGNS:  Blood pressure 156/70, pulse 62 and  regular, respirations 22 and temperature 98.4.  GENERAL APPEARANCE:  The patient is an elderly white man in no discomfort.  He alert, oriented, appropriate and responsive.  HEENT:  Head, eyes, nose and mouth are normal.  NECK:  The neck is without thyromegaly or adenopathy.  Carotid pulses are  palpable bilaterally and without bruits.   HEART:  Cardiac examination reveals a normal S1 and S2.  There is no S3, S4,  murmur, rub, or click.  Cardiac rhythm is regular.  CHEST:  No chest wall tenderness is noted.  LUNGS:  The lungs are clear.  ABDOMEN:  The abdomen is soft and nontender.  There is no mass,  hepatosplenomegaly, bruit, distention, rebound, guarding, or rigidity.  Bowel sounds are normal.  RECTAL AND GENITALIA:  Rectal and genital examinations are not performed as  they are not pertinent to the reason for acute care hospitalization.  EXTREMITIES:  The extremities are without edema, deviation or deformity.  Radial and dorsalis pedal pulses are palpable bilaterally.  NEUROLOGIC EXAMINATION:  Brief screening neurologic survey is unremarkable.   LABORATORY DATA:  The electrocardiogram revealed normal sinus rhythm, left  atrial enlargement and marked anterolateral ST segment depression.  Inferior  ST segment depression was also noted.  The chest radiograph, according to  the radiologist, demonstrated mild cardiomegaly as well as bibasilar  atelectasis.  The initial set of cardiac markers revealed a myoglobin of  134, CK MB 15.6 and troponin 3.01.  White count was 9.5 with a hemoglobin of  15.8 and hematocrit of 46.0.  Potassium was 4,2, BUN 24 and creatinine 1.0.  The remaining studies were pending at the time of this dictation.   IMPRESSION:  1. Acute non-ST segment elevation myocardial infarction.  2. Coronary artery disease status post percutaneous transluminal coronary     angioplasty in 1989; negative adenosine stress Cardiolite last week     during admission for chest pain.  3. Hypertension.  4. Status post recent left carpal tunnel release.  5. Gastroesophageal reflux.  6. Irritable bowel syndrome.  7. Benign prostatic hypertrophy.   PLAN:  1. Coronary care unit.  2. Serial cardiac enzymes.  3. Aspirin.  4. Heparin.  5. Integrilin. 6. Intravenous nitroglycerin.  7. Continue metoprolol.  8.  Echocardiogram to assess left ventricular function.  9. Further measures per Dr. Katrinka Blazing.                                                Caleb Nash  Rosaura Carpenter, MD    MSC/MEDQ  D:  08/27/2003  T:  08/27/2003  Job:  [id number]   cc:   Lyn Records III, M.D.  [address]  [address]  [place]  [place] [address]  Fax: [phone]

## 2010-09-09 NOTE — Discharge Summary (Signed)
NAME:  Caleb Nash, BIEHN NO.:  [id number]   MEDICAL RECORD NO.:  [id number]                   PATIENT TYPE:  INP   LOCATION:  2008                                 FACILITY:  [facility]   PHYSICIAN:  Salvatore Decent. Dorris Fetch, M.D.         DATE OF BIRTH:  24-Aug-1913   DATE OF ADMISSION:  08/27/2003  DATE OF DISCHARGE:  09/04/2003                                 DISCHARGE SUMMARY   ADDENDUM   The patient was initially thought to be ready for discharge on Sep 02, 2003,  however, pending morning evaluation, the patient was found to still be  approximately 13 pounds over his preoperative weight and was therefore kept  as an inpatient to continue diuresis and monitor.  The patient has done well  since that time and has continued to diurese well with IV and p.o. Lasix.  On the date of discharge, postop day #7, the patient was without complaint.  He was afebrile and vital signs were stable with a blood pressure of 112/48,  heart rate 61, O2 saturation of 93% on room air and he was maintaining  normal sinus rhythm.  The patient's current weight is 185 pounds.   DISCHARGE PHYSICAL EXAMINATION:  CARDIAC:  Regular rate and rhythm.  LUNGS:  Clear to auscultation bilaterally.  ABDOMEN:  Soft, positive bowel sounds.  EXTREMITIES:  The right lower extremity incision continued to have a serous  drainage.  All skin edges are intact and there is no evidence of infection.  There is 1-2+ pitting edema in the right lower extremity and is 1+ pitting  edema in the left lower extremity.  Ecchymosis present in the right medial  thigh and the left upper extremity continuing to improve.   CONDITION ON DISCHARGE:  The patient has continued to diurese and will need  to do so after discharge.  The patient's thrombocytopenia has resolved.  The  patient will need to continue working on his pulmonary toilet and cardiac  rehabilitation after discharge.  The patient's atrial fibrillation has  been  resolved with amiodarone.  The patient has continued to fluctuate between a  normal sinus rhythm and a sinus bradycardia.  His amiodarone was decreased  to 200 mg p.o. q.d. on postop day #6.  The patient tolerated this well.  The  patient is felt to be stable for discharge back to [facility] at this  time.   DISCHARGE MEDICATIONS:  1. Prinivil 10 mg p.o. q.d.  2. Amiodarone 200 mg q.d.  3. Aspirin 325 mg q.d.      Pecola Leisure, PA                      Salvatore Decent. Dorris Fetch, M.D.    AY/MEDQ  D:  09/04/2003  T:  09/04/2003  Job:  [id number]   cc:   Lyn Records III, M.D.  [address]  [address] [place] [address]  [place] [address]  Fax: [phone]

## 2010-09-09 NOTE — Consult Note (Signed)
NAME:  Caleb Nash, Caleb Nash NO.:  [id number]   MEDICAL RECORD NO.:  [id number]                   PATIENT TYPE:  INP   LOCATION:  2032                                 FACILITY:  [facility]   PHYSICIAN:  Vesta Mixer, M.D.              DATE OF BIRTH:  [birth year]   DATE OF CONSULTATION:  DATE OF DISCHARGE:                                   CONSULTATION   REFERRING PHYSICIAN:  Dr. Salvatore Decent. Hendrickson.   REASON FOR CONSULTATION:  Caleb Nash is an 77 year old gentleman with a  history of coronary artery disease.  He is status post recent coronary  artery bypass grafting.  He has been noted to have a recurrent pleural  effusion.  He had a thoracentesis several weeks ago but now has a recurrent  pleural effusion.  He was admitted to the hospital for placement of a chest  tube.  The patient has been relatively healthy most of his life.  The  patient was found to have coronary artery disease by heart catheterization  by Dr. Lyn Records several weeks ago.  He underwent an urgent coronary  artery bypass grafting.  He had a slow recovery and has had problems with  recurrent pleural effusions on the left side.   He has also been noted to be bradycardic throughout these admissions.  He  has never had any episodes of syncope or presyncope.  He does admit to not  having quite as much energy as he would like.   ALLERGIES:  He is allergic to CELEBREX, VIOXX and SULFA.   PAST MEDICAL HISTORY:  1. Hypertension.  2. Gastroesophageal reflux disease.  3. IBS.  4. BPH.  5. Carpal tunnel syndrome.  6. History of back surgery.  7. Rotator cuff problems.   SOCIAL HISTORY:  The patient is a nonsmoker.  The patient lives at [facility].   PHYSICAL EXAMINATION:  GENERAL:  On exam, he is an elderly gentleman in no  acute distress.  VITAL SIGNS:  His heart rate is 42, his blood pressure is 170/80 and the  patient is afebrile.  HEENT/NECK:  Exam reveals 2+ carotids.  He  has no bruits.  There is no JVD  and no thyromegaly.  LUNGS:  Lungs are clear to auscultation.  HEART:  Regular rate, S1 and S2, with no murmurs, gallops or rubs.  His  heart rate is bradycardic.  ABDOMEN:  His abdominal exam reveals good bowel sounds and is nontender.  EXTREMITIES:  He has no clubbing, cyanosis, or edema.  NEUROLOGIC:  Exam was nonfocal.   LABORATORY AND ACCESSORY CLINICAL DATA:  EKG reveals sinus bradycardia.   Laboratory data is pending.   IMPRESSION AND PLAN:  Bradycardia:  The patient has persistent bradycardia.  He is somewhat asymptomatic but I think that he would certainly feel better  if his heart rate were 75 or 80.  He is  relatively stable tonight and does  not need a temporary pacer.  Dr. Katrinka Blazing and the [facility] will follow up  for further management.                                               Vesta Mixer, M.D.    PJN/MEDQ  D:  10/07/2003  T:  10/08/2003  Job:  [id number]   cc:   Lyn Records III, M.D.  [address]  [address]  [place]  [place] [address]  Fax: [phone]   Salvatore Decent. Cornelius Moras, M.D.  [address]  [place]  [place] [address]

## 2010-09-09 NOTE — Consult Note (Signed)
NAME:  NAVJOT, LOERA NO.:  [id number]   MEDICAL RECORD NO.:  [id number]                   PATIENT TYPE:  INP   LOCATION:  2308                                 FACILITY:  [facility]   PHYSICIAN:  Salvatore Decent. Dorris Fetch, M.D.         DATE OF BIRTH:  February 06, 1914   DATE OF CONSULTATION:  08/27/2003  DATE OF DISCHARGE:                                   CONSULTATION   REASON FOR CONSULTATION:  Left main disease.   HISTORY OF PRESENT ILLNESS:  Mr. Gapinski is an 77 year old gentleman who lives  at [facility]. He had carpal tunnel syndrome surgery about a week  ago. He was hospitalized here last week with unstable angina. Adenosine  Cardiolite study was done which was negative for ischemia. He was discharged  on Monday but returned last night with substernal chest pressure which began  while at rest. He did have some mild shortness of breath. There was no  diaphoresis or nausea. Pain did not radiate. He was not sure what was  happening but was concerned it was his heart. He called EMS. Nitroglycerin  was given. His chest pain improved. He subsequently was brought to the  emergency department. He had a three hour episode of pain. He ruled in for  myocardial infarction with positive enzymes. Today, he was taken for cardiac  catheterization where he was found to have intra-apical hypokinesis but  overall preserved ventricular function with ejection fraction of 60%. He had  a 90% distal left main, 99% ostial LAD. He also had significant circumflex  disease and 99% distal right coronary stenosis. Currently, he is pain free  on Integrilin drip and heparin.   PAST MEDICAL HISTORY:  1. Coronary artery disease with history of MI and angioplasty in 1989.  2. Hypertension.  3. Left carpal tunnel surgery.  4. Back surgery for disk problems.  5. Gastroesophageal reflux.  6. Irritable bowel.  7. Benign prostatic hypertrophy.   MEDICATIONS ON ADMISSION:  1. Metoprolol  12.5 mg b.i.d.  2. Enteric-coated aspirin 81 mg daily.  3. Prinivil 5 mg daily.  4. Claritin 10 mg p.o. as needed.  5. Zoloft 50 mg daily.  6. Prilosec 20 mg daily.  7. Ambien 10 mg p.o. q.h.s. p.r.n.  8. Flomax 0.5 mg daily.  9. Mobic 7.5 mg daily.  10.      Cardizem CD 180 mg p.o. daily.   ALLERGIES:  He is allergic to SULFA, VIOXX, and CELEBREX.   SOCIAL HISTORY:  He lives at [facility]. He does not smoke. He remains  fairly active, including membership in the [employer].   FAMILY HISTORY:  Noncontributory.   REVIEW OF SYSTEMS:  He states he has not been feeling well for the past  week. He has been very tired and run down with intermittent chest pain. He  has some constipation, osteoarthritis, some history of incontinence  __________  . No known history of  stroke or seizure. All other systems are  negative.   PHYSICAL EXAMINATION:  GENERAL:  Mr. Tarver is a well appearing 77 year old  white male. He is no acute distress. Blood pressure 140/64, pulse 70,  respirations 18, oxygen saturation 98% on 2 liters nasal cannula. He is well-  developed, well-nourished.  NEUROLOGICAL:  He is alert and oriented x3. He is appropriate, grossly  intact.  HEENT:  Unremarkable.  NECK:  Supple. There is no thyromegaly, adenopathy, or bruits.  CARDIAC:  Regular rate and rhythm, normal S1 and S2. No definite murmur.  There is no gallop.  LUNGS:  Clear with equal breath sounds bilaterally.  ABDOMEN:  Soft, nontender.  EXTREMITIES:  Without clubbing, cyanosis, or edema. He has 2+ radial pulses  bilaterally. Dorsalis pedis pulses are __________  bilaterally.   STUDIES:  His chest x-ray shows likely asymmetric edema versus early  pneumonia. EKG shows left bundle with LVH. CPK on admission MB 13.7,  troponin was 2.33. His hematocrit was 46, platelets 151. PT 13.1, INR 1.0.  BUN and creatinine 24 and 1.0. White count was 9.52.   IMPRESSION:  Mr. Doyle is an 77 year old gentleman who although at  advanced  age is in apparently good condition. He remains fairly active although not  terribly active and now presents with week long unstable angina progressing  to a non-Q-wave myocardial infarction. He has critical left main and three  vessel disease at catheterization. Essentially, his only hope for survival  is bypass surgery. He understands the only alternative would be to handle  with medical treatment and that angioplasty was not an option. We discussed  the details with the patient and his family, the implications, risks,  benefits, and alternatives as well as the general nature of the operation as  well as expected outcome. He understands it is high risk secondary to his  advanced age, likely 10% risk of stroke or mortality. He also understands  there are other risks including MI, bleeding, need for transfusion,  infections, __________  respiratory, renal, GI complications, or DVT or PE.  They also understand there is a fairly high chance that he will have  decreased functional capacity and made need nursing home type care  postoperatively because of  his advanced age; however, given his current functional status, it is  reasonable to proceed with surgery, and he strongly agrees and wishes to  proceed with surgery. We will plan for OR first case in a.m. in the morning.  All of his questions were answered.                                               Salvatore Decent Dorris Fetch, M.D.    SCH/MEDQ  D:  08/27/2003  T:  08/28/2003  Job:  [id number]   cc:   Lesleigh Noe, M.D.  [address]  [address]  [place]  [place] [address]  Fax: [phone]   Brenner Visconti. Chilton Si, M.D.  [address]  [place]  [place] [address]  Fax: [phone]

## 2010-09-09 NOTE — Consult Note (Signed)
NAME:  Caleb Nash, FIX NO.:  [id number]   MEDICAL RECORD NO.:  [id number]                   PATIENT TYPE:  INP   LOCATION:  2926                                 FACILITY:  [facility]   PHYSICIAN:  Lesleigh Noe, M.D.            DATE OF BIRTH:  Apr 19, 1914   DATE OF CONSULTATION:  08/20/2003  DATE OF DISCHARGE:                                   CONSULTATION   CARDIOLOGY CONSULTATION:   CONCLUSIONS:  1. Exertional angina new onset.  2. Coronary atherosclerotic heart disease.     a. History of right coronary angioplasty in 1988.     b. Negative stress test 1995.  3. Left carpal tunnel surgery 1 week ago.  4. History of transient ischemic attack 2000.   RECOMMENDATIONS:  1. Serial enzymes.  2. EKG in the morning.  3. If rules out for myocardial infarction would do Adenosine-Cardiolite on     August 21, 2003 to exclude high risk subset.  4. Aspirin one per day.  5. Lovenox subcu.   COMMENTS:  [name] is 44, recently had carpal tunnel surgery.  Because of  some weak spells at his nursing home Cardizem and Lopressor were decreased  and/or stopped because of bradycardia.  Forty eight hours prior to admission  to the hospital he has had three episodes of postprandial exertional angina.  Episodes lasted less than 10 minutes and resolved with rest.  No shortness  of breath.   Medications on admission were Mobic 7.5 mg per day, Prinivil 5 mg per day,  Lopressor 12.5 mg b.i.d., Flomax, Zoloft, aspirin, Centrum Silver, Claritin,  Ambien.  As mentioned before Cardizem had been discontinued recently.   OBJECTIVE:  Blood pressure 140/70, heart rate 60.  No JVD, no carotid  bruits.  Lungs clear.  S4 gallop.  No murmur.  Abdomen soft.  Extremities no  edema.   EKG normal with sinus bradycardia noted, interventricular conduction delay.  Troponin 0.1, CK-MB normal.   DISCUSSION:  Patient has had angina since reduction in antianginal therapy.  It is difficult  to tell if this is unstable angina or just recrudescence of  angina that had been suppressed by calcium channel and beta blocker therapy.  We will do a Cardiolite to rule out high risk subset.  Patient prefers  conservative medical approach if possible.                                               Lesleigh Noe, M.D.    HWS/MEDQ  D:  08/22/2003  T:  08/22/2003  Job:  [id number]   cc:   Tasia Catchings, M.D.  [address]  [place]  [place] [address]  Fax: [phone]

## 2010-09-09 NOTE — Op Note (Signed)
NAME:  Caleb Nash, Caleb Nash                          ACCOUNT NO.:  [id number]   MEDICAL RECORD NO.:  [id number]                   PATIENT TYPE:  AMB   LOCATION:  DSC                                  FACILITY:  [facility]   PHYSICIAN:  Katy Fitch. Naaman Plummer., M.D.          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   OPERATION PERFORMED:  Release of left transverse carpal ligament and  decompression of left ulnar nerve at cubital tunnel.   SURGEON:  Katy Fitch. Sypher, M.D.   ASSISTANT:  Jonni Sanger, P.A.   ANESTHESIA:  General by LMA.   SUPERVISING ANESTHESIOLOGIST:  Bedelia Person, M.D.   INDICATIONS FOR PROCEDURE:  Caleb Nash is an 77 year old gentleman  referred by Tasia Catchings, M.D. for evaluation and management of hand  numbness.  Clinical examination revealed entrapment neuropathy of the median  nerve at the carpal canal and entrapment neuropathy of the ulnar nerve at  the cubital tunnel.  Electrodiagnostic studies by Dr. Johna Roles confirmed  neuropathy of both nerves.  Due to failure to respond to nonoperative  measures, the patient is brought to the operating room at this time for  decompression of his __________ ulnar nerve at the cubital tunnel and  decompression of his __________ median nerve at the carpal canal.  After  informed consent he is brought to the operating room at this time.   DESCRIPTION OF PROCEDURE:  The patient was brought to the operating room  placed in supine position upon the operating table.  Following induction of  general anesthesia by LMA, the left  arm was prepped with Betadine soap and  solution and sterilely draped.  Following exsanguination of the limb with an  Esmarch bandage, an arterial tourniquet was inflated to 230 mmHg.  The  procedure commenced with a short incision in line with the ring finger in  the palm.  Subcutaneous tissues are carefully divided revealing the palmar  fascia.  This was longitudinally to reveal the common sensory branch of the  median nerve.  They were followed back to the transverse carpal ligament  which was carefully isolated from the median nerve.  The ligament was  released on its ulnar border extending to the distal forearm.  Bleeding  points along the margin of the released ligament were electrocauterized with  bipolar current followed by repair of the skin with intradermal 3-0 Prolene  suture.  A compressive dressing was applied.   Attention was then directed to the elbow.  A short incision was fashioned  directly over the  path of the ulnar nerve.  Subcutaneous tissues were  carefully divided taking care to carefully protect the posterior branches of  the medial antebrachial cutaneous nerve.  The ulnar nerve was identified  posterior to the epicondyle.  The fascia overlying the nerve was released.  Osborne's ligament was released and the arcuate ligament released.  The  flexor carpi ulnaris tendon was released and the muscle fibers separated.  Proximal dissection revealed a rather tight cuff of tissue just proximal to  the medial epicondyle at the level of the ligament of Struthers.  This was  released with scissors dissection under direct vision.  Bleeding points were  electrocauterized with bipolar current.  The wound was explored 6 cm  proximal to the epicondyle and 6 cm distally.  Thereafter, range of motion  of the elbow revealed that the ulnar nerve was stable in the cubital groove.  The wound was then repaired with intradermal 3-0 Prolene with Steri-Strips.  An OpSite dressing was applied  at the elbow followed by Ace wrap.  There  were no apparent complications.  Mr. Sherrard tolerated the surgery and  anesthesia well.  The patient was transferred to the recovery room with  stable vital signs.   He will be discharged home with prescriptions for Percocet 5 mg one to two  tablets by mouth every four to six hours as needed for pain.  The patient  return to our office for follow-up in one week for a dressing change.  He  may begin immediate range of motion exercise to the elbow and fingers.                                               Katy Fitch Naaman Plummer., M.D.    RVS/MEDQ  D:  08/11/2003  T:  08/12/2003  Job:  [id number]

## 2010-09-09 NOTE — Op Note (Signed)
[facility]  Patient:    Caleb Nash, Caleb Nash Visit Number: [id number] MRN: [id number]          Service Type: OBV Location: 4W [id number] [id number] Attending Physician:  Marlowe Kays Page Dictated by:   Illene Labrador. Aplington, M.D. Proc. Date: 03/11/01 Admit Date:  03/11/2001                             Operative Report  PREOPERATIVE DIAGNOSIS:  Herniated nucleus pulposus and free fragment of L2-3, right.  POSTOPERATIVE DIAGNOSIS:  Herniated nucleus pulposus with free fragment of L2-3, right.  OPERATION:  Microdiskectomy of L2-3, right with excision of HNP and large free fragment.  SURGEON:  Illene Labrador. Aplington, M.D.  ASSISTANT:  Patricia Nettle, M.D.  ANESTHESIA:  General.  PATHOLOGY AND JUSTIFICATION FOR PROCEDURE:  Progressive back and right thigh pain with MRI showing large disk herniation and free fragment going cephalad behind the body of L2.  These findings were confirmed at surgery.  He also has disk abnormalities at every level along with significant spondylosis, but he and his family understand that this is the major problem confronting [place] today.  DESCRIPTION OF PROCEDURE:  Prophylactic antibiotics and satisfactory general anesthesia, prone position on the Wilson frame.  The back was prepped with DuraPrep, and with three spinal needles on lateral x-ray __________ to localize the L2-3 interspace.  Then completed draping the back in the sterile field.  Ioban employed.  A vertical midline incision based on the initial x-ray.  The spinous processes at this level were tagged with Kocher clamps with the two spinous processes at this level were tagged with Kocher clamps, and an additional lateral x-ray taken confirming that they were on the spinous process of L2 and L3.  We were able to also localize the level of the L2-3 disk space on this.  I then dissected the soft tissue off the lamina of L2 and L3, and placed a self-retaining McCullough retractor, and had  a good bit of hypertrophic bone.  I removed a significant portion of the hypertrophied lamina of L2, a double action rongeur, and then was able to undermine with a combination of 2 mm, 3 mm, and 4 mm Kerrison rongeurs working cephalad above the level of where we anticipated the disk and free fragment would lie.  Then working laterally and distally, I moved lateral bone and performed a wide foraminotomy.  When we had sufficient working room, I brought in the microscope, and trimmed off a little additional bone laterally.  The L3 nerve root was identified and retracted medially.  Several small vessels were coagulated with bipolar cautery.  The large disk herniation was found, and after being sure that we had the L3 nerve root protected, opened with a 15 knife blade.  A large chunk of disk fragment was removed at the level of the interspace, and I then continued to remove as much disk material as I could from the interspace with a combination of Epstein curets and regular micropituitary.  We worked beneath the dura.  This had a distended posterior longitudinal ligament, and no additional disk fragments were found in this location.  I then worked up laterally where MRI had indicated the free fragment, and trimmed away bone and ligamentum flavum, and found the large free fragment which I was able to extract with pituitary and Kerrison rongeurs.  It measured about the 2 cm in length as noted on the MRI.  We then checked the lateral gutter cephalad with a hockey stick and found this to be completely free now of any resistance to the hockey stick.  The L2 neuroforamen was opened.  We then checked beneath the dura once again, and also checked the foramen for the L3 nerve root which was widely patent.  There were some minor bleeders distally around the nerve root which we could not seem to control with the cautery, so I packed these with Gelfoam, and placed Gelfoam over the dura, and elected to use a  1/4 inch Penrose drain subcutaneously going into the wound through the fascia distally for precautionary reasons.  I carefully closed the fascia around it with interrupted #1 Vicryl, the subcutaneous tissue with 2-0 Vicryl, and the skin with staples.  A Betadine Adaptic dry sterile dressing were applied.  Tolerated the procedure well and was taken to the recovery room in satisfactory condition with no known complications. Dictated by:   Illene Labrador. Aplington, M.D. Attending Physician:  Joaquin Courts DD:  03/11/01 TD:  03/12/01 Job: [id number]

## 2010-09-09 NOTE — Op Note (Signed)
NAME:  Caleb Nash, Caleb Nash NO.:  [id number]   MEDICAL RECORD NO.:  [id number]                   PATIENT TYPE:  INP   LOCATION:  2308                                 FACILITY:  [facility]   PHYSICIAN:  Salvatore Decent. Dorris Fetch, M.D.         DATE OF BIRTH:  07-04-1913   DATE OF PROCEDURE:  08/28/2003  DATE OF DISCHARGE:                                 OPERATIVE REPORT   PREOPERATIVE DIAGNOSES:  Left main and three-vessel disease, status post  myocardial infarction.   POSTOPERATIVE DIAGNOSES:  Left main and three-vessel disease, status post  myocardial infarction.   PROCEDURE:  Median sternotomy, extracorporeal circulation, coronary artery  bypass graft surgery x5 (left internal mammary artery to the left anterior  descending coronary artery, saphenous vein graft to the first diagonal,  sequential saphenous vein graft to the obtuse marginal-I and II, saphenous  vein graft to the posterior descending).   SURGEON:  Salvatore Decent. Dorris Fetch, M.D.   ASSISTANT:  Pecola Leisure, P.A.   FINDINGS:  The vein bifurcated in the mid-thigh, necessitating open harvest  vein of good quality, considering the patient's age.  Mild sternal  osteoporosis.  Mammary of good quality.  Left ventricular hypertrophy.  Good  quality targets.  The LAD intramyocardial.   INDICATIONS FOR PROCEDURE:  The patient is an 77 year old gentleman who  presented approximately one week ago with symptoms consistent with unstable  angina.  At that time a Cardiolite was negative.  The patient subsequently  was discharged home.  He then presented with a prolonged episode of chest  pain and ruled in for a non-Q-wave myocardial infarction.  He was taken then  the following day to cardiac catheterization, where he was found to have a  90% distal left main stenosis, as well as severe three-vessel coronary  artery disease.  The left ventricular function was reasonably well-  preserved.  The patient was  referred for a coronary artery bypass graft  surgery.  Despite his advanced age, he was felt to be a candidate.  The  indications, risks, benefits, and alternatives were discussed in detail with  the patient.  He understood the high risk nature of the procedure, and  agreed to proceed.   DESCRIPTION OF PROCEDURE:  The patient was brought to the preoperative  holding area on Aug 28, 2003.  Lines were placed to monitor arterial, central  venous and pulmonary arterial pressure.  Intravenous antibiotics were  administered.  The patient was taken to the operating room, anesthetized and  intubated.  A Foley catheter was placed.  The chest, abdomen and legs were  prepped and draped in the usual fashion.  An incision was made in the medial  aspect of the right leg at the level of the knee.  The greater saphenous  vein was identified and harvested endoscopically.  During the harvest it  became evident that the vein was bifurcated in its mid-portion, and  therefore an  open vein harvest was necessary.  A median sternotomy was  performed.  The left internal mammary artery was harvested in the standard  fashion.  The patient was fully heparinized prior to dividing the distal end  of the mammary artery.  There was excellent flow through the cut end of the  vessel.  The pericardium was opened.  The ascending aorta was inspected and palpated.  There was no palpable atherosclerotic disease.  The aorta was cannulated via  concentric #2-0 Ethibond pledgeted sutures, after assuring adequate  anticoagulation with ACT measurement.  A dual-stage venous cannula was  placed via a pursestring suture in the right atrial appendage.  The  cardiopulmonary bypass was instituted and the patient was cooled to 32  degrees Celsius.  The coronary arteries were inspected, and anastomotic  sites were chosen.  The conduits were inspected and cut to length.  A foam  pad was placed in the pericardium to protect the left phrenic  nerve.  A  temperature probe was placed in the myocardial septum and a cardioplegic  cannula was placed in the ascending aorta.  The aorta was crossclamped.  The left ventricle was emptied via the aortic  root vent.  Cardiac arrest  was achieved with a combination of cold  antegrade blood cardioplegia and topical iced saline.  Then 1 L of  cardioplegia was administered.  The myocardial septal temperature was 11  degrees Celsius.  The following distal anastomoses were performed:  First, the reverse  saphenous vein graft was placed end-to-side to the posterior descending  branch of the right coronary.  This was a 1.5 mm good quality target.  The  vein was of good quality for the patient's age, and the anastomosis was  performed with a running #7-0 Prolene suture.  There was good flow through  the graft.  Cardioplegia was administered.  There was good hemostasis.  Next, a reverse saphenous vein graft was placed end-to-side to the first  diagonal branch of the LAD.  This was a 1.0 mm fair quality target.  The  vein again was of good quality.  The anastomosis was performed end-to-side  with running #7-0 Prolene suture.  The flow was appropriate for the size of  the target vessel.  Cardioplegia was administered and there was good  hemostasis.  Next, a reverse saphenous vein graft was placed sequentially to obtuse  marginal-I and II.  The obtuse marginal-I was a high anterolateral branch,  and the obtuse marginal-II was a dominant lateral branch.  Both were 1.5 mm  good quality targets.  A side-to-side anastomosis was performed to the OM-I  off the side branch of the vein graft.  The distal end of the vein was then  cut to length and anastomosed end-to-side to the OM-II.  Both were performed  with running #7-0 Prolene sutures.  Both anastomoses were probed proximally  and distally prior to tying the suture.  There was excellent flow through the graft.  Cardioplegia was administered.  There was  good hemostasis.  Next, the left internal mammary artery was brought through a window in the  pericardium.  The distal limb was spatulated.  It was a 2.0 mm good quality  conduit with excellent flow.  It was anastomosed end-to-side to the distal  LAD which was a 1.5 mm intramyocardial vessel.  The anastomosis was  performed end-to-side with a running #8-0 Prolene suture.  At the completion  of the mammary to the LAD anastomosis, the bulldog clamp was briefly  removed  to inspect for hemostasis.  Immediate and rapid septal rewarming was noted.  The bulldog clamp was replaced.  The mammary pedicle was tacked to the  epicardial surface of the heart with #6-0 Prolene sutures.  Additional cardioplegia was administered.  The vein grafts were cut to  length.  The cardioplegic cannula was removed from the ascending aorta.  The  proximal vein graft anastomoses were performed to a 4.0 mm punch  aortotomies, all under cross clamp.  At the completion of the final proximal  anastomosis, lidocaine was administered.  The patient was placed in a steep  Trendelenburg position.  The bulldog clamp was removed from the mammary  artery.  Immediate and rapid septal rewarming was once again noted.  The  aortic root was deaired and the aortic cross clamp was removed.  The total  cross clamp time was 89 minutes.  The patient was rewarmed.  All proximal and distal anastomoses were  inspected for hemostasis.  The epicardial pacing wires were placed on the  right ventricle and the right atrium.  When the patient had been rewarmed to  a core temperature of 37 degrees Celsius, a low-dose dopamine drip was  initiated.  The patient was paced for heart block.  He was paced at 90 beats  per minute.  At the initial attempt, the patient was  initially weaned from  bypass; however, shortly thereafter the heart distended.  No air could be  seen within the vein grafts.  The patient was placed back on cardiopulmonary  bypass.   The heart was emptied and allowed to rest.  The dopamine infusion  was increased.  After resting for 15 minutes, the patient then was weaned  from cardiopulmonary bypass without difficulty on the second attempt.  The  first bypass time was 155 minutes.  The second bypass time was 15 minutes.  The total bypass time was 170 minutes.  The initial cardiac index was about 1.7 L per minute per sq/m.  The patient  subsequently improved hemodynamically.  He had indices of greater than 2.  A  test dose of Protamine was administered and was well-tolerated.  The atrial  and aortic cannulae were removed.  The remainder of the Protamine was  administered without incident.  The chest was irrigated with 1 L of warm  normal saline containing 1 g of vancomycin.  Hemostasis was achieved.  The  pericardium was not reapproximated.  A left pleural and two mediastinal  chest tubes were placed through separate subcostal incisions.  The sternum was closed with heavy gauge interrupted stainless steel wires.  The  remainder of the incisions were closed in a standard fashion.  All sponge,  needle and instrument counts were  correct at the end of the procedure.  The patient was transported from the operating room to the surgical  intensive care unit in critical but stable condition.                                               Salvatore Decent Dorris Fetch, M.D.    SCH/MEDQ  D:  08/28/2003  T:  08/29/2003  Job:  [id number]   cc:   Lyn Records III, M.D.  [address]  [address]  [place]  [place] [address]  Fax: [phone]   Jhamir Pickup. Chilton Si, M.D.  [address]  [place]  [place] [address]  Fax: [phone]

## 2010-09-09 NOTE — Consult Note (Signed)
NAME:  Caleb Nash, Caleb Nash NO.:  [id number]   MEDICAL RECORD NO.:  [id number]                   PATIENT TYPE:  INP   LOCATION:  2032                                 FACILITY:  [facility]   PHYSICIAN:  Armanda Magic, M.D.                  DATE OF BIRTH:  1914-04-17   DATE OF CONSULTATION:  10/08/2003  DATE OF DISCHARGE:                                   CONSULTATION   REFERRING PHYSICIAN:  Dr. Charlett Lango.   CHIEF COMPLAINT:  Bradyarrhythmias.   This is an 77 year old gentleman with a history of coronary disease.  He is  status post recent coronary artery bypass grafting and was admitted with  recurrent left pleural effusion.  He had had a thoracentesis several weeks  ago and then had a recurrent pleural effusion and was admitted on October 07, 2003 for placement of a chest tube.  He has had a slow recovery because of  recurrent pleural effusion.  Apparently also had some bradycardia prior to  his surgery and remained bradycardic after surgery.  He was seen recently in  the office by Dr. Verdis Prime on June 1 and was noted to have a junctional  bradyarrhythmia with a heart rate of 48 at that time.  Blood pressure was  stable at 140/50.  He was on amiodarone 200 mg b.i.d. which he had been on  since hospitalization and this was discontinued, and he was to follow up  with Dr. Katrinka Blazing at a later date.  We are now asked to reevaluate because of  bradyarrhythmias including junctional bradyarrhythmias which are similar to  what he had when he was seen by Dr. Katrinka Blazing 2 weeks ago.  He is completely  asymptomatic in regards to dizzy spells, presyncope or syncope, or fatigue.  Again, he has had some problems with recurrent pleural effusions but  otherwise from an arrhythmia standpoint has been asymptomatic.   ALLERGIES:  1. CELEBREX.  2. VIOXX.  3. SULFA.   PAST MEDICAL HISTORY:  Significant for:  1. Hypertension.  2. Gastroesophageal reflux disease.  3. Irritable  bowel syndrome.  4. BPH.  5. Carpal tunnel syndrome.  6. History of back surgery.  7. Rotator cuff problems.   SOCIAL HISTORY:  He is a nonsmoker.  He lives a [facility].  He has one  daughter who is getting ready to have breast cancer surgery.   REVIEW OF SYSTEMS:  Other than what is stated in the HPI includes shortness  of breath from his pleural effusions.   PHYSICAL EXAMINATION:  VITAL SIGNS:  His blood pressure is 108/46 to 166/56,  heart rate 47, he is afebrile, respirations 20, O2 saturation 98% on room  air.  HEENT:  Benign.  NECK:  Supple without lymphadenopathy.  Carotid upstrokes +2 bilaterally  with no bruits.  LUNGS:  Have decreased breath sounds at the bases bilaterally.  HEART:  Regular rate and rhythm but bradycardic.  No murmurs, rubs, or  gallops.  Normal S1, S2.  ABDOMEN:  Soft, nontender, nondistended, with active bowel sounds.  EXTREMITIES:  No edema.   His EKG on admission showed a junctional bradycardia at 41 beats per minute  with nonspecific ST abnormality.  There was a retrograde P wave noted after  the QRS and nonspecific intraventricular conduction delay.  In review of his  telemetry strips, he has had some sinus bradycardia with clearly P waves in  front of the QRS complexes that do appear to conduct through to the  ventricle and there have also been some blocked PACs.  There are also some  rhythm strips with a junctional bradycardia with retrograde P wave  conduction.  Laboratory data shows normal cardiac enzymes.  BMET:  Sodium  137, potassium 5.1, chloride 106, CO2 27, glucose 139, BUN 11, creatinine  1.8.  CBC:  White cell count 7.6, hematocrit 34.8, hemoglobin 12.2, and  platelet count 210.  Cardiac markers x3 are negative.  Chest x-ray showed  left chest tube placement with decrease in left pleural effusion, no  pneumothorax.   ASSESSMENT:  1. Junctional bradyarrhythmias with intermittent sinus bradycardia.  The     patient has been on  amiodarone.  It was stopped 2 weeks ago but had     gotten a load of amiodarone so therefore most likely still has amiodarone     in his system.  He is completely asymptomatic from an arrhythmia     standpoint with no presyncope, syncope, or dizzy spells.  2. Coronary disease status post coronary artery bypass grafting.  3. Persistent left pleural effusions now status post chest tube placement.   PLAN:  At this time, given the patient's age of 22 years and the fact that  he is asymptomatic from his bradyarrhythmias I would continue to watch his  rhythm at this time.  Clearly he probably has amiodarone still on-board and  therefore I am going to order an amiodarone level to see if clearly he has  any amiodarone left at this time in his system.  Unless he becomes  symptomatic at this time from his bradyarrhythmias would continue to  monitor.  At this time there is no absolute indication for permanent  pacemaker placement since the patient is asymptomatic and his heart rate is  above 30, and actually it is in the high 40s at this time.  I would check a  TSH as it does not appear he has had a TSH drawn in several months to make  sure that he is not hypothyroid which would cause bradyarrhythmias and heart  failure symptoms.  We will follow along with you.                                               Armanda Magic, M.D.    TT/MEDQ  D:  10/08/2003  T:  10/10/2003  Job:  [id number]   cc:   Lesleigh Noe, M.D.  [address]  [address]  [place]  [place] [address]  Fax: [phone]   Salvatore Decent. Dorris Fetch, M.D.  [address]  [place]  [place] [address]

## 2010-09-09 NOTE — Cardiovascular Report (Signed)
NAME:  Caleb Nash, Caleb Nash NO.:  [id number]   MEDICAL RECORD NO.:  [id number]                   PATIENT TYPE:  INP   LOCATION:  2308                                 FACILITY:  [facility]   PHYSICIAN:  Lyn Records III, M.D.            DATE OF BIRTH:  1913/07/05   DATE OF PROCEDURE:  DATE OF DISCHARGE:                              CARDIAC CATHETERIZATION   INDICATIONS FOR PROCEDURE:  Acute coronary syndrome with elevated cardiac  markers and recurring chest discomfort at rest.  The patient has diffuse ST  segment depression on his EKGs during pain raising the possibility of  significant large myocardium at risk such as left main disease.   PROCEDURE PERFORMED:  1. Left heart catheterization.  2. Selective coronary angiography.  3. Left ventriculography.  4. AngioSeal arteriotomy closure.   DESCRIPTION:  After informed consent and under urgent circumstances the  patient was brought to the catheterization lab where a 6-French sheath was  started in the right femoral artery using a modified Seldinger technique.  A  6-French, a 2 multipurpose catheter, was used for hemodynamic records, left  ventriculography by hand injection, and attempted selective coronary  angiography.  We ultimately used a #4, 6-French left Judkins catheter for  left coronary angiography and a 6-French right Judkins catheter for right  coronary angiography.  The patient tolerated the diagnostic procedure  without chest pain and hemodynamic abnormalities.  AngioSeal arteriotomy  closure was performed after angiography on the right femoral demonstrated  adequate entry site.   RESULTS:   I. HEMODYNAMIC DATA:  A.  Aortic pressure 157/70 mmHg.  B.  Left ventricular pressure 157/7 mmHg.   II. LEFT VENTRICULOGRAPHY:  The left ventricle was normal size.  There was  annular apical hypokinesis.  EF of 60%.  No mitral regurgitation.   III. CORONARY ANGIOGRAPHY:  A.  LEFT MAIN CORONARY:  Heavily  calcified.  There is distal 90+% stenosis.  B.  LEFT ANTERIOR DESCENDING CORONARY:  The left anterior descending is a  large vessel that extends transapically.  There is 95-99% obstruction in the  osteal and proximal LAD and 90% mid-LAD obstruction.  Two small diagonal  branches arise from the proximal vessel.  C.  CIRCUMFLEX ARTERY:  The circumflex artery gives origin to a large first  obtuse marginal, a small second obtuse marginal, and a large third obtuse  marginal.  There is osteal 85-90% LAD circumflex stenosis.  After the first  obtuse marginal there is 90% circumflex stenosis and the first obtuse  marginal contains a segmental 80% stenosis.  Both the first and third obtuse  marginals are graftable.  D.  RIGHT CORONARY:  The right coronary contains severe tandem lesions in  the midvessel, 85-90%.  Distally the vessel is subtotally occluded and the  PDA fills by right-to-right collaterals. The PDA is small-to-moderate in  size and may be graftable.   CONCLUSIONS:  1. Severe  coronary artery disease with severe distal left main, severe     proximal LAD and circumflex lesions, severe first obtuse marginal     stenosis and high-grade multiple stenoses in the mid-and-distal RCA.  The     PDA is relatively small.  2. Overall normal LV function without __________ apical or hypokinesis.   PLAN:  1. Transfer to CCU.  2. Continue Integrilin.  3. Resume Lovenox.  4. CVTS has been consulted.  5. Dr. Dorris Fetch will see this evening.  6. The patient should be given strong consideration for surgical     revascularization for survival benefits despite his 77 years of age.                                               Lesleigh Noe, M.D.    HWS/MEDQ  D:  08/27/2003  T:  08/29/2003  Job:  [id number]   cc:   Tasia Catchings, M.D.  [address]  [address]  [place]  [place] [address]  Fax: [phone]   Salvatore Decent. Dorris Fetch, M.D.  [address]  [place]  [place] [address]

## 2010-09-09 NOTE — Discharge Summary (Signed)
NAME:  Caleb Nash, Caleb Nash NO.:  [id number]   MEDICAL RECORD NO.:  [id number]                   PATIENT TYPE:  INP   LOCATION:  2032                                 FACILITY:  [facility]   PHYSICIAN:  Salvatore Decent. Dorris Fetch, M.D.         DATE OF BIRTH:  1913/08/10   DATE OF ADMISSION:  10/07/2003  DATE OF DISCHARGE:  10/15/2003                                 DISCHARGE SUMMARY   ADDENDUM:  To previously dictated discharge summary, Job [id number].   Initially, it was anticipated that Mr. Shidler would be discharged home on  October 13, 2003; however, during morning rounds he was noted to have had 2  separate 2.5 second pauses during the previous night.  He was also noted to  have a junctional rhythm with a heart rate in the 50s while ambulating.  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  It was determined that he should remain hospitalized for a few  days to undergo implantation of a permanent pacemaker.  On October 14, 2003, he  did undergo insertion of a permanent dual chamber transvenous pacemaker by  Dr. Corliss Marcus.  He tolerated this procedure well and no pneumothorax was  noted post procedure.  On the morning of October 15, 2003, Mr. Waldron's  pacemaker check was within normal limits.  His telemetry showed that he was  atrial pacing at 60 beats per minute.  He was evaluated by cardiology who  felt from their standpoint he was stable for discharge home.  There was no  hematoma over the pacemaker insertion site.  His morning chest x-ray did  show a slight increase in his left pleural effusion but this was felt to be  stable.  Mr. Turgeon denied any shortness of breath.  He did have a trace to  1+ pitting edema at his right ankle.  This was the leg used for his  venectomy.  Otherwise, there was no sign of infection and his incisions were  healing well.  His blood pressure was stable at 115/53.  He was afebrile and  saturating 97%  on room air.  He continued to tolerate a normal diet and his  bowel and bladder function worked appropriately.  It was felt that he was  stable for discharge home later that day.  He would be continued on oral  diuretic therapy for his pleural effusion and will see Dr. Dorris Fetch in 7  to 10 days as an outpatient.   DISCHARGE MEDICATIONS:  As previously dictated.   ADDITIONAL INSTRUCTIONS:  1. In addition to the previously dictated discharge instructions, he was     instructed to weigh himself every 1 to 2 days and to notify Dr. Katrinka Blazing if     his weight was increasing greater than 2 pounds every 24 hours or greater     than 4 pounds in 40 hours.  2.  He was also notified to call the CVTS office if he developed shortness of     breath, or redness or drainage from his chest tube site as well as for     fever.  3. He is to follow up with Dr. Dorris Fetch on October 27, 2003 at 1 p.m.  He is     to have a chest x-ray at the [facility] one hour before at 12 p.m.  He was instructed to bring     his chest x-ray films with him to the CVTS office.  4. He is to follow up with Dr. Katrinka Blazing on Monday, October 30, 2003, at 10 a.m.      Jerold Coombe, P.A.                  Salvatore Decent Dorris Fetch, M.D.    AWZ/MEDQ  D:  10/15/2003  T:  10/17/2003  Job:  [id number]   cc:   Lesleigh Noe, M.D.  [address]  [address]  [place]  [place] [address]  Fax: [phone]

## 2010-09-09 NOTE — Discharge Summary (Signed)
[facility]  Patient:    Caleb Nash, Caleb Nash Visit Number: [id number] MRN: [id number]          Service Type: SUR Location: 4W [id number] [id number] Attending Physician:  Marlowe Kays Page Dictated by:   Ralene Bathe, P.A.-C. Admit Date:  03/11/2001 Disc. Date: 03/13/01                             Discharge Summary  ADMISSION DIAGNOSES: 1. Herniated nucleus pulposus, L2-3 on the right. 2. Coronary artery disease, status post angioplasty. 3. History of urinary retention. 4. History of benign prostatic hypertrophy. 5. Gastroesophageal reflux disease. 6. Osteoarthritis. 7. History of renal calculi. 8. Depression. 9. Hypertension.  DISCHARGE DIAGNOSES:  1. Herniated nucleus pulposus, L2-3 on the right.  2. Coronary artery disease, status post angioplasty.  3. History of urinary retention.  4. History of benign prostatic hypertrophy.  5. Gastroesophageal reflux disease.  6. Osteoarthritis.  7. History of renal calculi.  8. Depression.  9. Hypertension. 10. Status post microdiskectomy, L2-3 on the right.  OPERATION:  Status post microdiskectomy, L2-3 on the right.  SURGEON:  Illene Labrador. Aplington, M.D.  ASSISTANTPatricia Nettle, M.D.  BRIEF HISTORY:  This is an 77 year old male with progressive back and right thigh pain with MRI showing large disk herniation and free fragment behind the body of L2.  Findings confirmed at surgery.  He was having significant pain and spondylosis as well, but wished to proceed with microdiskectomy.  HOSPITAL COURSE:  The patient was admitted and underwent the above named procedure and tolerated this well.  All appropriate IV antibiotics and analgesics were utilized.  Postoperatively, the patient was placed on back precautions and up ad lib with therapy.  He was weaned to p.o. analgesics.  These were weaned by postoperative day #2, and he was ambulating distances over 100 feet with hand held cane.  At this time, he was felt  medically and orthopedically stable.  He had had a Penrose drain that was placed intraoperatively and was removed on postoperative day #1 without complications.  His dressing was clean and dry. He was afebrile and vital signs were stable.  He is neurovascularly intact. Had good relief of his right leg pain.  His back was sore as expected.  On day March 13, 2001, he was stable for discharge to home.  LABORATORY DATA:  Section shows admission BMET and hemoglobin preoperative within normal limits.  X-rays showed a localized _________ at the appropriate level.  Chest x-ray preoperatively on March 07, 2001, showed no evidence of active disease and some scoliosis noted.  EKG shows normal sinus rhythm, nonspecific ST abnormalities.  No old EKG to compare to.  CONDITION ON DISCHARGE:  Stable and improved.  DISCHARGE MEDICATIONS AND PLAN:  The patient is to be discharged to home.  He is to follow up in two weeks postoperatively, call for time.  He will be on back precautions.  May shower in three days.  Prescriptions given for Percocet 5/325 mg #30 one every four to six p.r.n. pain, Robaxin 500 mg one every eight p.r.n. spasm, #30.  Resume home medications and home diet. Dictated by:   Ralene Bathe, P.A.-C. Attending Physician:  Joaquin Courts DD:  03/13/01 TD:  03/13/01 Job: [id number]

## 2010-09-09 NOTE — H&P (Signed)
NAME:  Caleb Nash, WEIRAUCH NO.:  [id number]   MEDICAL RECORD NO.:  [id number]                   PATIENT TYPE:  INP   LOCATION:  DSC                                  FACILITY:  [facility]   PHYSICIAN:  Tasia Catchings, M.D.                DATE OF BIRTH:  [birth year]   DATE OF ADMISSION:  08/20/2003  DATE OF DISCHARGE:  08/24/2003                                HISTORY & PHYSICAL   Mr. Consalvo is an 77 year old male admitted with chest pain.  One week ago, he  had the release of both carpal tunnel syndrome and ulnar compression at the  elbow on his left arm.  In the immediate postop period, he developed  bradycardia and was thought to be having a vasovagal reaction.  His Cardizem  was discontinued in favor of lisinopril and his Lopressor was changed from  25 mg daily to 12.5 mg twice a day.  He also a little atrophy.  He was  followed closely in the skilled nursing section of [facility] for  several days, and three days ago returned back to his normal apartment at  [facility].  Yesterday, while walking back to his apartment from lunch,  he developed chest tightness.  It lasted about 15 minutes and stopped as  soon as he got to his apartment.  Today, he had a similar experience and  went down to the infirmary where they suggested that he come to my office.  He had a third episode of chest tightness when walking in from the parking  lot.  His EKG was obtained in my office at a time when the discomfort was  going on and was clearly different from his previous EKG about two years  ago.  There was a loss of anterior forces in the precordial's and some of  this may be due to an incomplete partial right bundle branch block, however,  there was also some ST segment sagging in the lateral precordial's,  suggesting possible coronary insufficiency.  Because of that and because of  his previous coronary insufficiency.  Because of that, and because his  previous history  is one of a right coronary artery PTCA in 1989 with a  negative stress thalium test, he was admitted to the hospital for possible  accelerating angina.   Other medical problems include possible ASCVD resulting in two episodes of  diminished consciousness in 2000.  One of them, at least, sounded like a  possible vasovagal reaction.  He had a Holter monitor and a neurologic work  up by Dr. 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  So far, he has not had a recurrence.   He has the depression, which began the summer of 2001 and has been  successfully treated with Zoloft, which we have continued indefinitely.  He  has irritable bowel syndrome with chronic constipation.  Negative flexible  sigmoidoscopy and barium enema in 1991 and was due for a full colonoscopy in  2008.  He had a negative flexible sigmoidoscopy in 2003.   He has chronic insomnia and takes Ambien as needed for that.   He has both the carpal tunnel syndrome and the ulnar compression for which  he had the recent surgery.   He had DJD and is status post bilateral knee replacements.  He has chronic  back pain and takes Mobic for that plus his recent surgery.   He has bilateral rotator cuff tears, which was repaired on the left, but may  ultimately require surgery on the right.  He has chronic hoarseness with  cough post prandially, which did not get better even though he is taking  Protonix on a regular basis.   He has mild GERD.  He has labile hypertension.  He has erectile dysfunction.   PAST MEDICAL HISTORY:  Allergies:  Voltaren, sulfa, Vioxx and Motrin.  Smoking:  Quit in 1973.  Alcohol:  Three to seven weekly.  Caffeine:  None.   CURRENT MEDICATIONS:  1. Prinvil 5 mg daily, which we are going to switch back to Cardizem 180 CD.  2. Lopressor 25 mg daily, which we are going to double.  3. Ecotrin one tablet daily.  4. Ambien 10 mg q.h.s.  5. Zoloft 50 mg daily.  6. Flomax 0.4 mg at  bedtime.  7. Mobic daily.  8. Claretin daily.  9. Protonix 40 mg daily.   PREVIOUS SURGERY:  Appendectomy, right hernia, right laser eye surgery,  TURP, left epididymis removed bilateral knee replacements.  Mole removed  from the back.  Both lenses removed.  Left rotator cuff tear with clavicular  resection.  Laminectomy and the recent carpal tunnel syndrome and ulnar  release on the left.   INJURIES:  None.   FAMILY HISTORY:  Father died at age 32 of a CVA.  Mother died at age 98 of  old age.  No siblings.  One daughter alive and well.   SOCIAL HISTORY:  Native of [place] who has lived in [place] since 1989.  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  Currently  lives at [facility].   PHYSICAL EXAMINATION:  GENERAL:  A well developed white male.  VITAL SIGNS:  Blood pressure was 130/80, pulse was 84 and regular.  Temperature was normal.  HEENT:  Negative except for bilateral lens replacements.  NECK:  Supple without nodes, bruits or thyromegaly and no JVD.  CHEST:  Clear.  HEART:  Tones normal.  ABDOMEN:  Scaphoid and nontender.  EXTREMITIES:  Revealed the recent surgery on the left with a large  ecchymosis that extends to the hand and no evidence of edema.  He has toe  fungus.  NEUROLOGIC:  Grossly normal.   EKG shows sinus rhythm and as described above some changes suggesting both  partial right bundle branch block and possibly coronary insufficiency.   IMPRESSION:  Accelerating angina.   PLAN:  The patient is admitted for further evaluation and treatment.                                                Tasia Catchings, M.D.    JW/MEDQ  D:  08/20/2003  T:  08/20/2003  Job:  [id number]

## 2010-09-09 NOTE — Op Note (Signed)
NAMEALGIS, LEHENBAUER                ACCOUNT NO.:  [id number]   MEDICAL RECORD NO.:  [id number]          PATIENT TYPE:  AMB   LOCATION:  DSC                          FACILITY:  [facility]   PHYSICIAN:  Katy Fitch. Sypher, M.D. 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   OPERATION:  1.  Release of right transcarpal ligament.  2.  Release of right ulnar nerve at cubital tunnel.   SURGEON:  Katy Fitch. Sypher, M.D.   ASSISTANT:  Marveen Reeks. Dasnoit, P.A.-C.   ANESTHESIA:  General by LMA.   ANESTHESIOLOGIST:  Quita Skye. Krista Blue, M.D.   INDICATIONS:  Caleb Nash is an 77 year old gentleman referred to the  courtesy of Dr. Garnette Scheuermann for evaluation and management of hand and arm  numbness.  Clinical examination suggested carpal tunnel syndrome and ulnar  neuropathy at the level the cubital tunnel.  Due to a failure to respond to  nonoperative measures and with electrodiagnostic studies documenting  bilateral carpal tunnel syndrome and bilateral ulnar neuropathy at the  elbows, Mr. Chiang is brought to the operating room this time anticipating  decompression of his right carpal canal and his right cubital tunnel.   PROCEDURE:  Hughie Melroy is brought to the operating room and placed in  supine position on the table.  Following a cardiology evaluation and consent  by Dr. Katrinka Blazing and anesthesia consultation by Dr. Krista Blue, general anesthesia  by LMA was induced.  The right arm was prepped with Betadine soap solution  and sterilely draped.  A pneumatic tourniquet spot proximal right brachium.  Following exsanguination of the right arm with an Esmarch bandage, arterial  tourniquet was  inflated to 250 mmHg due to possible systolic hypertension.  The procedure commenced with short incision in the line of the ring finger  in the palm.  The subcutaneous tissue were carefully divided revealing the  palmar fascia.  This was split longitudinally to the common sensory branch  of the median nerve.  These were followed back to the median nerve proper  which was gently isolated from the transcarpal ligament.  The ligament  released with scissors along its ulnar border extending into the distal  forearm.  The volar forearm fascia was likewise released subcutaneously.  This widely opened carpal canal.  No mass or other predicaments were noted.  Bleeding points along margin of the released ligament were electrocauterized  with bipolar current followed by repair of the skin with intradermal 3-0  Prolene suture.   Attention directed to the right medial elbow.  A 2 cm incision was fashioned  directly over the path of the ulnar nerve.  The subcutaneous tissue were  carefully divided revealing the posterior cutaneous sensory branch of the  medial antebrachial cutaneous nerve.  This was gently retracted.  The nerve  was identified posterior the epicondyle.  The arcuate ligament was released  and the fascia at the head of flexor carpi ulnaris was released.  A Glorious Peach  was used to assure that there was no compression of the nerve deep to  several vascular leashes accompanying the nerve deep to the head of the  flexor carpi ulnaris.  The proximal brachial fascia was likewise released.  The floor the cubital tunnel was inspected and found be free of osteophytes  or other anatomic predicaments.  The nerve was noted to be stable through a  range of 0 to 140 degrees of elbow flexion.  Bleeding points were  electrocauterized with bipolar current followed by repair of the skin with  intradermal 3-0 Prolene suture and Steri-Strips.  A Tegaderm dressing was  applied directly to the wound followed by an  Ace wrap at the elbow.  A volar  plaster splint maintaining the wrist in 5 degrees dorsiflexion was applied  to protect the hand wound.  There no apparent complications.   Mr. Neisler was awakened from general anesthesia and transferred to the  recovery room with stable vital signs.  He will be discharged to the care of  his family, specifically his daughter, with prescriptions for Percocet 5 mg  1 p.o. q.4-6h. p.r.n. pain.  He will return to see me for reevaluation the  office in one week.      Katy Fitch Sypher, M.D.  Electronically Signed     RVS/MEDQ  D:  08/08/2005  T:  08/08/2005  Job:  [id number]   cc:   Lyn Records, M.D.  Fax: [phone]

## 2010-09-09 NOTE — Discharge Summary (Signed)
NAME:  Caleb Nash, Caleb Nash NO.:  [id number]   MEDICAL RECORD NO.:  [id number]                   PATIENT TYPE:  INP   LOCATION:  2008                                 FACILITY:  [facility]   PHYSICIAN:  Salvatore Decent. Dorris Fetch, M.D.         DATE OF BIRTH:  1913-06-05   DATE OF ADMISSION:  08/27/2003  DATE OF DISCHARGE:  09/01/2003                                 DISCHARGE SUMMARY   HISTORY OF PRESENT ILLNESS:  Mr. Vosler is an 77 year old male, admitted to  [facility] for what appeared to be acute non-ST segment elevation  myocardial infarction.  The patient has a history of coronary artery disease  which dates back to 1989 at with time he underwent a PTCA.  He was  hospitalized the week prior to admission for accelerating angina.  An  adenosine Cardiolite study was negative for ischemia; however, and he was  discharged.  He returned to the emergency department on the day of admission  after experiencing another episode of chest pain.  This began in the evening  while he was sitting.  The pain was described as lower substernal pressure.  It did not radiate.  It was associated with mild dyspnea but no diaphoresis  or nausea.  There were no significant exacerbating or ameliorating factors.  The pain was not related to position, activity, meals, or respirations.  EMS  was ultimately summoned.  Nitroglycerin was administered upon their arrival  which resulted in a resolution of his chest pain.  He was transported to the  emergency department and had no further chest pain upon arrival.  Total  duration of chest pain was approximately 3 hours.  He was felt to require  admission for further evaluation and treatment.   Echocardiogram revealed normal sinus rhythm, left atrial enlargement, and  marked anterolateral ST segment depression.  Inferior ST segment depression  was also noted.  The chest x-ray, according to the radiologist, demonstrated  mild cardiomegaly as  well as bibasilar atelectasis.  Initial set of cardiac  markers revealed a myoglobin of 134, CK-MB 15.6, and troponin of 3.01.   PAST MEDICAL HISTORY:  1. Hypertension.  2. Gastroesophageal reflux.  3. Irritable bowel syndrome.  4. Benign prostatic hypertrophy.  5. Recent left carpal tunnel release.   ALLERGIES:  1. SULFA.  2. VIOXX.  3. CELEBREX.   MEDICATIONS ON ADMISSION:  1. Metoprolol 12.5 mg b.i.d.  2. Enteric-coated aspirin 81 mg daily.  3. Prinivil 5 mg daily.  4. Claritin 10 mg daily.  5. Zoloft 50 mg daily.  6. Prilosec 20 mg daily.  7. Ambien 10 mg p.o. h.s. p.r.n.  8. Flomax 0.4 mg daily.  9. Mobic 7.5 mg daily.  10.      Nitroglycerin patch 0.4 mg/hr in the a.m. and off at bedtime.  11.      Cardizem CD 180 mg daily.   SOCIAL HISTORY:  The patient lives  at [facility].  He does  not smoke.   PAST SURGICAL HISTORY:  1. Bilateral total knee replacement.  2. Left rotator cuff surgery.  3. Back surgery.  4. The aforementioned carpal tunnel release.   FAMILY HISTORY:  Noncontributory.   REVIEW OF SYSTEMS AND PHYSICIAN EXAMINATION:  Please see the history and  physical done at the time of admission.   HOSPITAL COURSE:  The patient was admitted with an acute non-ST segment  elevation myocardial infarction.  Plan was for admission to the coronary  care unit, serial cardiac enzymes, aspirin, heparin, Integrilin, intravenous  nitroglycerin, continued beta blocker, echocardiogram to assess left  ventricular function, and further measures as per cardiology work-up  including probable cardiac catheterization.   The patient was admitted and taken to the cardiac catheterization lab by Dr.  Katrinka Blazing, where he was found to have severe left main ostial and proximal LAD,  ostial circumflex, and proximal OM 1 disease.  The right coronary artery was  severely diseased proximally and distally.  He had a normal left  ventriculogram.  Due to these findings,  surgical consultation was obtained  with Charlett Lango, MD, to evaluate the patient and studies and agreed.  Although he was at higher risk due to his age, he was a candidate for a  surgical revascularization.   PROCEDURE:  On Aug 28, 2003, the following procedure was performed:  Coronary  artery bypass grafting x 5.  The following grafts were placed:  1. Left internal mammary artery to the LAD.  2. Saphenous vein graft to the posterior descending.  3. Saphenous vein graft to the diagonal.  4. Sequential saphenous vein graft to the obtuse marginal 1 and obtuse     marginal 2.  The patient tolerated the procedure well and was taken to     the surgical intensive care unit in stable condition.   POSTOPERATIVE HOSPITAL COURSE:  The patient has done quite well overall.  He  has maintained stable hemodynamics.  Initially, he did have some increased  chest tube drainage requiring transfusion of platelets, fresh frozen plasma,  and packed red blood cells.  The bleeding did slow, and chest tubes were  ultimately discontinued in a standard fashion.  All routine lines, monitors,  and drainage devices were discontinued in a routine manner.  The patient  also had postoperative atrial fibrillation and required chemical  cardioversion with amiodarone and has maintained normal sinus rhythm.  He  was on a beta blocker; however, this made some sinus bradycardia, and this  was stopped.  Overall, he has progressed nicely in regard to cardiac  rehabilitation, phase .  His incisions are healing well without  evidence of infection.  He has undergone a gentle diuresis but will require  further as an outpatient.  Oxygen has been weaned, and he maintains good  saturations on room air.  He is afebrile.  He is tolerating a diet and  activities commiserate for level of postoperative convalescence and overall deconditioning due to his age and comorbidities, but ultimately he is felt  to be quite  stable for discharge back to the nursing facility on Sep 02, 2003, pending morning round reevaluation and bed availability.   MEDICATIONS ON DISCHARGE:  1. Prinivil 5 mg daily.  2. __________ 10/20, 1 daily.  3. Ultram 1-2 q.6h. p.r.n. pain.  4. Flomax 0.4 mg daily.  5. Zoloft 50 mg daily.  6. Prilosec 20 mg daily.  7. Amiodarone 200 mg b.i.d.  8. Ambien 5 mg daily q.h.s. p.r.n.  9. Lasix 40 mg daily x 7 days.  10.      K-Dur 20 mEq daily x 7 days.   FOLLOW UP:  The patient will receive written instructions in regard to  medications, activity, wound care, and follow up to include Dr. Dorris Fetch,  on October 13, 2003, at 2:30.  He is also instructed to make an appointment to  see Dr. Katrinka Blazing in 2 weeks.   CONDITION ON DISCHARGE:  Stable and improved.   FINAL DIAGNOSES:  1. Non-ST segment elevation myocardial infarction.  2. Coronary artery disease.  3. Previous PTCA in 1989.  4. Hypertension.  5. History of recent left carpal tunnel release.  6. History of gastroesophageal reflux.  7. History of irritable bowel syndrome.  8. History of benign prostatic hyperplasia.   LABORATORY DATA:  Most recent laboratory values:  Hemoglobin and hematocrit  dated Sep 01, 2003, 9.9 and 28.3, respectively.  Electrolytes, BUN, and  creatinine all within normal limits.  The patient maintains normal sinus  rhythm.      Rowe Clack, P.A.-C.                    Salvatore Decent Dorris Fetch, M.D.    Sherryll Burger  D:  09/01/2003  T:  09/01/2003  Job:  [id number]   cc:   Salvatore Decent. Dorris Fetch, M.D.  [address]  [place]  [place] [address]   Quita Skye. Waldon Reining, MD  [address]  [place], [place] [address]  Fax: [phone]   Lyn Records III, M.D.  [address]  [address]  [place]  [place] [address]  Fax: [phone]   Tasia Catchings, M.D.  [address]  [place]  [place] [address]  Fax: [phone]

## 2011-02-06 LAB — BASIC METABOLIC PANEL
BUN: 13
Calcium: 9.5
Creatinine, Ser: 0.97
Potassium: 5.2 — ABNORMAL HIGH
Sodium: 136

## 2011-02-06 LAB — APTT: aPTT: 29

## 2011-02-06 LAB — PROTIME-INR: Prothrombin Time: 13.4

## 2011-02-06 LAB — CBC
Platelets: 159
RDW: 12.7

## 2011-04-27 ENCOUNTER — Other Ambulatory Visit (HOSPITAL_COMMUNITY): Payer: Self-pay | Admitting: *Deleted

## 2011-04-27 DIAGNOSIS — I10 Essential (primary) hypertension: Secondary | ICD-10-CM | POA: Diagnosis not present

## 2011-04-27 DIAGNOSIS — R05 Cough: Secondary | ICD-10-CM | POA: Diagnosis not present

## 2011-04-28 ENCOUNTER — Other Ambulatory Visit (HOSPITAL_COMMUNITY): Payer: Self-pay | Admitting: Geriatric Medicine

## 2011-04-28 DIAGNOSIS — R05 Cough: Secondary | ICD-10-CM

## 2011-05-01 DIAGNOSIS — Z950 Presence of cardiac pacemaker: Secondary | ICD-10-CM | POA: Diagnosis not present

## 2011-05-01 DIAGNOSIS — I495 Sick sinus syndrome: Secondary | ICD-10-CM | POA: Diagnosis not present

## 2011-05-03 ENCOUNTER — Ambulatory Visit (HOSPITAL_COMMUNITY)
Admission: RE | Admit: 2011-05-03 | Discharge: 2011-05-03 | Disposition: A | Discharge status: Home / Self Care | Payer: Medicare Other | Source: Ambulatory Visit | Attending: Geriatric Medicine | Admitting: Geriatric Medicine

## 2011-05-03 ENCOUNTER — Other Ambulatory Visit (HOSPITAL_COMMUNITY): Payer: Self-pay

## 2011-05-03 DIAGNOSIS — R1310 Dysphagia, unspecified: Secondary | ICD-10-CM | POA: Diagnosis not present

## 2011-05-03 DIAGNOSIS — R05 Cough: Secondary | ICD-10-CM

## 2011-05-03 NOTE — Procedures (Addendum)
Modified Barium Swallow Procedure Note Patient Details  Name: Caleb Nash MRN: [id number] Date of Birth: 1914-01-22  Today's Date: 05/03/2011 Time:1001  - 1105     Recommendation/Prognosis  Clinical Impression Clinical impression: Pt presents with mild oropharyngeal dysphagia with TRACE SILENT aspiration of thin (first bolus and with multiple sequential straw sips of thin) due to decr laryngeal elevation/closure and decr epiglottic deflection.  Small single boluses prevent aspiration.  Chin tuck posture did not prevent aspiration or penetration, but cued throat clear removed trace penetrates.  Pt also with mild pharyngeal stasis after the swallow with all consistencies without sensation, liquid swallows aided clearance of solids, dry swallows decr stasis of liquids, and head turn left prevented accumulation of barium.  SLP questions if pt has signs of neurological dysphagia due to sensory deficit, ? mild left facial droop, pt reports left sided drooling when sleeping.  SLP had pt look in mirror and he reports his facial symmetry to be at baseline.  Provided pt with tips to maximize his airway protection and decrease aspiration, as well as swallowing exercises.  Advised pt to be extra cautious if he becomes acutely ill secondary to lack of aspiration and residual awareness, as ongoing aspiration may contribute to his yearly bronchitis episodes.  Pt verbalized understanding to information.  Thanks for this referral.   Swallow Recommendations Solid Consistency: Dysphagia 3 (Mechanical soft) Liquid Consistency: Thin (water with meals) Medication Administration: Other (Comment) (defer to pt, crush if not contraindicated and problematic) Compensations: Slow rate;Small sips/bites;Follow solids with liquid (intermittent dry swallow) Postural Changes and/or Swallow Maneuvers: Seated upright 90 degrees;Head turn left during swallow Oral Care Recommendations: Oral care QID Other Recommendations: Clarify  dietary restrictions Follow up Recommendations: None Prognosis Prognosis for Safe Diet Advancement: Fair Individuals Consulted Consulted and Agree with Results and Recommendations: Patient Report Sent to : Referring physician  General:   HPI: 78 year old male referred by Dr Pete Glatter for swallow evaluation due to concerns pt may be aspirating. PMH + for CAD s/p CABG, orthopedic surgeries, bronchitis yearly per pt, DDD.  Pt reported mentioning to Dr Pete Glatter that he coughs during and after meals a times, therefore MD ordered MBS.  Coughing occurs more with solids than liquids and at times pt has problems swallowing pills.  Pt reports his dysphagia to be present for years and not worsening.  Pt denies weight loss.  He drove himself today and is mobile.   Pt is a retired Air[employer] pilot .  Pt medication list includes Prilosec and Miralax.   DG cervical spine 2000 revaled severe degenerative facet and DDD, mild C6-C7 subluxation.     Type of Study: Initial MBS Diet Prior to this Study: Regular;Thin liquids Behavior/Cognition: Alert Oral Cavity - Dentition: Adequate natural dentition Baseline Vocal Quality: Normal Volitional Cough: Strong Volitional Swallow: Able to elicit Anatomy: Within functional limits  Reason for Referral:  Concern for aspiration  Oral Phase Oral Preparation/Oral Phase Oral Phase: Impaired Oral - Thin Oral - Thin Cup: Piecemeal swallowing;Other (Comment) (discoordination with first swallow, test effects likely) Oral - Thin Straw: Piecemeal swallowing Oral Phase - Comment Oral Phase - Comment: premature spillage of oral residuals into pharynx after the swallow, delayed reflexive swallow effective Pharyngeal Phase  Pharyngeal Phase Pharyngeal Phase: Impaired Pharyngeal - Thin Pharyngeal - Thin Cup: Reduced epiglottic inversion;Reduced pharyngeal peristalsis;Trace aspiration;Penetration/Aspiration during swallow;Pharyngeal residue - valleculae;Compensatory strategies  attempted (Comment);Reduced laryngeal elevation;Reduced airway/laryngeal closure;Reduced tongue base retraction (small bolus prevent asp, chin tuck not helpful, dry swallow) Penetration/Aspiration details (thin  cup): Material enters airway, passes BELOW cords without attempt by patient to eject out (silent aspiration) Pharyngeal - Thin Straw: Trace aspiration;Reduced tongue base retraction;Reduced airway/laryngeal closure;Reduced laryngeal elevation;Reduced anterior laryngeal mobility;Reduced epiglottic inversion;Reduced pharyngeal peristalsis;Penetration/Aspiration during swallow;Pharyngeal residue - valleculae (small single sips, dry swallows, throat clear) Penetration/Aspiration details (thin straw): Material enters airway, passes BELOW cords without attempt by patient to eject out (silent aspiration) Pharyngeal - Solids Pharyngeal - Puree: Compensatory strategies attempted (Comment);Reduced tongue base retraction;Pharyngeal residue - valleculae (head turn left decr stasis, liquid swallow) Pharyngeal - Regular: Compensatory strategies attempted (Comment);Pharyngeal residue - valleculae (head turn left decr stasis, liquid swallow) Pharyngeal - Pill: Within functional limits Pharyngeal Phase - Comment Pharyngeal Comment: Cued dry swallows aided clearance of stasis, head turn left prevented accumulation of stasis, cued throat clear removed trace penetrates of thin Cervical Esophageal Phase  Cervical Esophageal Phase Cervical Esophageal Phase:  (appearance of prominent cp x1 did not impair barium flow)     Chales Abrahams 05/03/2011, 1:13 PM   SLP provided pt with written tips, including to start meals with water, nine times more likely to get pna if aspirate solid vs liquids, water pH neutral *safest to consume, and xerostomia tips/products.

## 2011-05-25 DIAGNOSIS — H353290 Exudative age-related macular degeneration, unspecified eye, stage unspecified: Secondary | ICD-10-CM | POA: Diagnosis not present

## 2011-05-25 DIAGNOSIS — H35059 Retinal neovascularization, unspecified, unspecified eye: Secondary | ICD-10-CM | POA: Diagnosis not present

## 2011-06-08 DIAGNOSIS — I10 Essential (primary) hypertension: Secondary | ICD-10-CM | POA: Diagnosis not present

## 2011-06-08 DIAGNOSIS — M6530 Trigger finger, unspecified finger: Secondary | ICD-10-CM | POA: Diagnosis not present

## 2011-06-08 DIAGNOSIS — R05 Cough: Secondary | ICD-10-CM | POA: Diagnosis not present

## 2011-06-08 DIAGNOSIS — Z79899 Other long term (current) drug therapy: Secondary | ICD-10-CM | POA: Diagnosis not present

## 2011-07-05 DIAGNOSIS — H35059 Retinal neovascularization, unspecified, unspecified eye: Secondary | ICD-10-CM | POA: Diagnosis not present

## 2011-07-05 DIAGNOSIS — H353290 Exudative age-related macular degeneration, unspecified eye, stage unspecified: Secondary | ICD-10-CM | POA: Diagnosis not present

## 2011-08-16 DIAGNOSIS — H35059 Retinal neovascularization, unspecified, unspecified eye: Secondary | ICD-10-CM | POA: Diagnosis not present

## 2011-08-16 DIAGNOSIS — H353290 Exudative age-related macular degeneration, unspecified eye, stage unspecified: Secondary | ICD-10-CM | POA: Diagnosis not present

## 2011-08-23 DIAGNOSIS — Z950 Presence of cardiac pacemaker: Secondary | ICD-10-CM | POA: Diagnosis not present

## 2011-09-25 DIAGNOSIS — H3530 Unspecified macular degeneration: Secondary | ICD-10-CM | POA: Diagnosis not present

## 2011-09-27 DIAGNOSIS — H35359 Cystoid macular degeneration, unspecified eye: Secondary | ICD-10-CM | POA: Diagnosis not present

## 2011-09-27 DIAGNOSIS — H353290 Exudative age-related macular degeneration, unspecified eye, stage unspecified: Secondary | ICD-10-CM | POA: Diagnosis not present

## 2011-10-04 DIAGNOSIS — H353290 Exudative age-related macular degeneration, unspecified eye, stage unspecified: Secondary | ICD-10-CM | POA: Diagnosis not present

## 2011-10-04 DIAGNOSIS — H35059 Retinal neovascularization, unspecified, unspecified eye: Secondary | ICD-10-CM | POA: Diagnosis not present

## 2011-10-24 DIAGNOSIS — I4891 Unspecified atrial fibrillation: Secondary | ICD-10-CM | POA: Diagnosis not present

## 2011-10-24 DIAGNOSIS — I2510 Atherosclerotic heart disease of native coronary artery without angina pectoris: Secondary | ICD-10-CM | POA: Diagnosis not present

## 2011-10-24 DIAGNOSIS — I10 Essential (primary) hypertension: Secondary | ICD-10-CM | POA: Diagnosis not present

## 2011-10-24 DIAGNOSIS — Z79899 Other long term (current) drug therapy: Secondary | ICD-10-CM | POA: Diagnosis not present

## 2011-11-01 DIAGNOSIS — H35059 Retinal neovascularization, unspecified, unspecified eye: Secondary | ICD-10-CM | POA: Diagnosis not present

## 2011-11-01 DIAGNOSIS — H353290 Exudative age-related macular degeneration, unspecified eye, stage unspecified: Secondary | ICD-10-CM | POA: Diagnosis not present

## 2011-11-08 DIAGNOSIS — M549 Dorsalgia, unspecified: Secondary | ICD-10-CM | POA: Diagnosis not present

## 2011-11-08 DIAGNOSIS — Z0000 Encounter for general adult medical examination without abnormal findings: Secondary | ICD-10-CM | POA: Diagnosis not present

## 2011-11-08 DIAGNOSIS — Z1331 Encounter for screening for depression: Secondary | ICD-10-CM | POA: Diagnosis not present

## 2011-11-08 DIAGNOSIS — Z79899 Other long term (current) drug therapy: Secondary | ICD-10-CM | POA: Diagnosis not present

## 2011-11-15 DIAGNOSIS — H353290 Exudative age-related macular degeneration, unspecified eye, stage unspecified: Secondary | ICD-10-CM | POA: Diagnosis not present

## 2011-11-15 DIAGNOSIS — H35059 Retinal neovascularization, unspecified, unspecified eye: Secondary | ICD-10-CM | POA: Diagnosis not present

## 2011-12-04 DIAGNOSIS — I495 Sick sinus syndrome: Secondary | ICD-10-CM | POA: Diagnosis not present

## 2011-12-04 DIAGNOSIS — Z950 Presence of cardiac pacemaker: Secondary | ICD-10-CM | POA: Diagnosis not present

## 2011-12-13 DIAGNOSIS — H353290 Exudative age-related macular degeneration, unspecified eye, stage unspecified: Secondary | ICD-10-CM | POA: Diagnosis not present

## 2011-12-13 DIAGNOSIS — H35359 Cystoid macular degeneration, unspecified eye: Secondary | ICD-10-CM | POA: Diagnosis not present

## 2011-12-13 DIAGNOSIS — H35059 Retinal neovascularization, unspecified, unspecified eye: Secondary | ICD-10-CM | POA: Diagnosis not present

## 2011-12-27 DIAGNOSIS — H35359 Cystoid macular degeneration, unspecified eye: Secondary | ICD-10-CM | POA: Diagnosis not present

## 2011-12-27 DIAGNOSIS — H353290 Exudative age-related macular degeneration, unspecified eye, stage unspecified: Secondary | ICD-10-CM | POA: Diagnosis not present

## 2012-01-24 DIAGNOSIS — H35059 Retinal neovascularization, unspecified, unspecified eye: Secondary | ICD-10-CM | POA: Diagnosis not present

## 2012-01-24 DIAGNOSIS — H353290 Exudative age-related macular degeneration, unspecified eye, stage unspecified: Secondary | ICD-10-CM | POA: Diagnosis not present

## 2012-01-26 DIAGNOSIS — R351 Nocturia: Secondary | ICD-10-CM | POA: Diagnosis not present

## 2012-01-26 DIAGNOSIS — N401 Enlarged prostate with lower urinary tract symptoms: Secondary | ICD-10-CM | POA: Diagnosis not present

## 2012-01-31 DIAGNOSIS — H35059 Retinal neovascularization, unspecified, unspecified eye: Secondary | ICD-10-CM | POA: Diagnosis not present

## 2012-01-31 DIAGNOSIS — H353290 Exudative age-related macular degeneration, unspecified eye, stage unspecified: Secondary | ICD-10-CM | POA: Diagnosis not present

## 2012-02-07 DIAGNOSIS — H4011X0 Primary open-angle glaucoma, stage unspecified: Secondary | ICD-10-CM | POA: Diagnosis not present

## 2012-02-07 DIAGNOSIS — H35359 Cystoid macular degeneration, unspecified eye: Secondary | ICD-10-CM | POA: Diagnosis not present

## 2012-02-07 DIAGNOSIS — H353290 Exudative age-related macular degeneration, unspecified eye, stage unspecified: Secondary | ICD-10-CM | POA: Diagnosis not present

## 2012-02-07 DIAGNOSIS — H35059 Retinal neovascularization, unspecified, unspecified eye: Secondary | ICD-10-CM | POA: Diagnosis not present

## 2012-02-08 DIAGNOSIS — H4011X0 Primary open-angle glaucoma, stage unspecified: Secondary | ICD-10-CM | POA: Diagnosis not present

## 2012-02-09 DIAGNOSIS — H4011X0 Primary open-angle glaucoma, stage unspecified: Secondary | ICD-10-CM | POA: Diagnosis not present

## 2012-02-14 DIAGNOSIS — Z23 Encounter for immunization: Secondary | ICD-10-CM | POA: Diagnosis not present

## 2012-02-16 DIAGNOSIS — H4011X0 Primary open-angle glaucoma, stage unspecified: Secondary | ICD-10-CM | POA: Diagnosis not present

## 2012-02-21 DIAGNOSIS — H353290 Exudative age-related macular degeneration, unspecified eye, stage unspecified: Secondary | ICD-10-CM | POA: Diagnosis not present

## 2012-03-07 DIAGNOSIS — H35059 Retinal neovascularization, unspecified, unspecified eye: Secondary | ICD-10-CM | POA: Diagnosis not present

## 2012-03-07 DIAGNOSIS — H353290 Exudative age-related macular degeneration, unspecified eye, stage unspecified: Secondary | ICD-10-CM | POA: Diagnosis not present

## 2012-03-07 DIAGNOSIS — H35359 Cystoid macular degeneration, unspecified eye: Secondary | ICD-10-CM | POA: Diagnosis not present

## 2012-03-26 DIAGNOSIS — Z950 Presence of cardiac pacemaker: Secondary | ICD-10-CM | POA: Diagnosis not present

## 2012-03-27 DIAGNOSIS — H353290 Exudative age-related macular degeneration, unspecified eye, stage unspecified: Secondary | ICD-10-CM | POA: Diagnosis not present

## 2012-04-02 DIAGNOSIS — H4011X0 Primary open-angle glaucoma, stage unspecified: Secondary | ICD-10-CM | POA: Diagnosis not present

## 2012-04-10 DIAGNOSIS — H3570 Unspecified separation of retinal layers: Secondary | ICD-10-CM | POA: Diagnosis not present

## 2012-04-10 DIAGNOSIS — H353290 Exudative age-related macular degeneration, unspecified eye, stage unspecified: Secondary | ICD-10-CM | POA: Diagnosis not present

## 2012-04-18 DIAGNOSIS — R05 Cough: Secondary | ICD-10-CM | POA: Diagnosis not present

## 2012-04-21 ENCOUNTER — Encounter (HOSPITAL_COMMUNITY): Payer: Self-pay | Admitting: Physical Medicine and Rehabilitation

## 2012-04-21 ENCOUNTER — Inpatient Hospital Stay (HOSPITAL_COMMUNITY)
Admission: EM | Admit: 2012-04-21 | Discharge: 2012-04-25 | DRG: 557 | Disposition: A | Discharge status: Skilled Nursing Facility | Payer: Medicare Other | Attending: Internal Medicine | Admitting: Internal Medicine

## 2012-04-21 ENCOUNTER — Emergency Department (HOSPITAL_COMMUNITY): Payer: Medicare Other

## 2012-04-21 ENCOUNTER — Inpatient Hospital Stay (HOSPITAL_COMMUNITY): Payer: Medicare Other

## 2012-04-21 DIAGNOSIS — I2510 Atherosclerotic heart disease of native coronary artery without angina pectoris: Secondary | ICD-10-CM

## 2012-04-21 DIAGNOSIS — I471 Supraventricular tachycardia, unspecified: Secondary | ICD-10-CM | POA: Diagnosis present

## 2012-04-21 DIAGNOSIS — I509 Heart failure, unspecified: Secondary | ICD-10-CM | POA: Diagnosis not present

## 2012-04-21 DIAGNOSIS — I4891 Unspecified atrial fibrillation: Secondary | ICD-10-CM | POA: Diagnosis not present

## 2012-04-21 DIAGNOSIS — R071 Chest pain on breathing: Secondary | ICD-10-CM | POA: Diagnosis not present

## 2012-04-21 DIAGNOSIS — I5043 Acute on chronic combined systolic (congestive) and diastolic (congestive) heart failure: Secondary | ICD-10-CM | POA: Diagnosis not present

## 2012-04-21 DIAGNOSIS — I214 Non-ST elevation (NSTEMI) myocardial infarction: Secondary | ICD-10-CM

## 2012-04-21 DIAGNOSIS — F329 Major depressive disorder, single episode, unspecified: Secondary | ICD-10-CM | POA: Diagnosis present

## 2012-04-21 DIAGNOSIS — S5000XA Contusion of unspecified elbow, initial encounter: Secondary | ICD-10-CM | POA: Diagnosis not present

## 2012-04-21 DIAGNOSIS — I319 Disease of pericardium, unspecified: Secondary | ICD-10-CM

## 2012-04-21 DIAGNOSIS — S199XXA Unspecified injury of neck, initial encounter: Secondary | ICD-10-CM | POA: Diagnosis not present

## 2012-04-21 DIAGNOSIS — E872 Acidosis, unspecified: Secondary | ICD-10-CM | POA: Diagnosis present

## 2012-04-21 DIAGNOSIS — S01501A Unspecified open wound of lip, initial encounter: Secondary | ICD-10-CM | POA: Diagnosis present

## 2012-04-21 DIAGNOSIS — D72829 Elevated white blood cell count, unspecified: Secondary | ICD-10-CM | POA: Diagnosis present

## 2012-04-21 DIAGNOSIS — M1990 Unspecified osteoarthritis, unspecified site: Secondary | ICD-10-CM | POA: Diagnosis present

## 2012-04-21 DIAGNOSIS — Y9210 Unspecified residential institution as the place of occurrence of the external cause: Secondary | ICD-10-CM | POA: Diagnosis present

## 2012-04-21 DIAGNOSIS — I4719 Other supraventricular tachycardia: Secondary | ICD-10-CM

## 2012-04-21 DIAGNOSIS — Z7982 Long term (current) use of aspirin: Secondary | ICD-10-CM

## 2012-04-21 DIAGNOSIS — R0602 Shortness of breath: Secondary | ICD-10-CM | POA: Diagnosis not present

## 2012-04-21 DIAGNOSIS — I495 Sick sinus syndrome: Secondary | ICD-10-CM

## 2012-04-21 DIAGNOSIS — Z950 Presence of cardiac pacemaker: Secondary | ICD-10-CM

## 2012-04-21 DIAGNOSIS — F3289 Other specified depressive episodes: Secondary | ICD-10-CM | POA: Diagnosis present

## 2012-04-21 DIAGNOSIS — IMO0002 Reserved for concepts with insufficient information to code with codable children: Secondary | ICD-10-CM | POA: Diagnosis present

## 2012-04-21 DIAGNOSIS — N401 Enlarged prostate with lower urinary tract symptoms: Secondary | ICD-10-CM | POA: Diagnosis present

## 2012-04-21 DIAGNOSIS — S0990XA Unspecified injury of head, initial encounter: Secondary | ICD-10-CM | POA: Diagnosis not present

## 2012-04-21 DIAGNOSIS — K5900 Constipation, unspecified: Secondary | ICD-10-CM | POA: Diagnosis present

## 2012-04-21 DIAGNOSIS — F1010 Alcohol abuse, uncomplicated: Secondary | ICD-10-CM | POA: Diagnosis present

## 2012-04-21 DIAGNOSIS — M6282 Rhabdomyolysis: Principal | ICD-10-CM | POA: Diagnosis present

## 2012-04-21 DIAGNOSIS — R7989 Other specified abnormal findings of blood chemistry: Secondary | ICD-10-CM | POA: Diagnosis not present

## 2012-04-21 DIAGNOSIS — H3530 Unspecified macular degeneration: Secondary | ICD-10-CM | POA: Diagnosis present

## 2012-04-21 DIAGNOSIS — I132 Hypertensive heart and chronic kidney disease with heart failure and with stage 5 chronic kidney disease, or end stage renal disease: Secondary | ICD-10-CM | POA: Diagnosis present

## 2012-04-21 DIAGNOSIS — J189 Pneumonia, unspecified organism: Secondary | ICD-10-CM | POA: Diagnosis present

## 2012-04-21 DIAGNOSIS — Z951 Presence of aortocoronary bypass graft: Secondary | ICD-10-CM

## 2012-04-21 DIAGNOSIS — S298XXA Other specified injuries of thorax, initial encounter: Secondary | ICD-10-CM | POA: Diagnosis not present

## 2012-04-21 DIAGNOSIS — J449 Chronic obstructive pulmonary disease, unspecified: Secondary | ICD-10-CM | POA: Diagnosis not present

## 2012-04-21 DIAGNOSIS — N400 Enlarged prostate without lower urinary tract symptoms: Secondary | ICD-10-CM | POA: Diagnosis present

## 2012-04-21 DIAGNOSIS — W19XXXA Unspecified fall, initial encounter: Secondary | ICD-10-CM

## 2012-04-21 DIAGNOSIS — R0989 Other specified symptoms and signs involving the circulatory and respiratory systems: Secondary | ICD-10-CM | POA: Diagnosis present

## 2012-04-21 DIAGNOSIS — I309 Acute pericarditis, unspecified: Secondary | ICD-10-CM | POA: Diagnosis not present

## 2012-04-21 DIAGNOSIS — S91109A Unspecified open wound of unspecified toe(s) without damage to nail, initial encounter: Secondary | ICD-10-CM | POA: Diagnosis not present

## 2012-04-21 DIAGNOSIS — I10 Essential (primary) hypertension: Secondary | ICD-10-CM | POA: Diagnosis present

## 2012-04-21 HISTORY — DX: Supraventricular tachycardia: I47.1

## 2012-04-21 HISTORY — DX: Sick sinus syndrome: I49.5

## 2012-04-21 HISTORY — DX: Unspecified osteoarthritis, unspecified site: M19.90

## 2012-04-21 HISTORY — DX: Benign prostatic hyperplasia without lower urinary tract symptoms: N40.0

## 2012-04-21 HISTORY — DX: Other supraventricular tachycardia: I47.19

## 2012-04-21 HISTORY — DX: Disease of pericardium, unspecified: I31.9

## 2012-04-21 LAB — COMPREHENSIVE METABOLIC PANEL
Albumin: 3.4 g/dL — ABNORMAL LOW (ref 3.5–5.2)
Alkaline Phosphatase: 93 U/L (ref 39–117)
BUN: 42 mg/dL — ABNORMAL HIGH (ref 6–23)
Chloride: 93 mEq/L — ABNORMAL LOW (ref 96–112)
Creatinine, Ser: 1.14 mg/dL (ref 0.50–1.35)
GFR calc Af Amer: 61 mL/min — ABNORMAL LOW (ref >90)
Glucose, Bld: 154 mg/dL — ABNORMAL HIGH (ref 70–99)
Potassium: 4.7 mEq/L (ref 3.5–5.1)
Total Bilirubin: 0.7 mg/dL (ref 0.3–1.2)

## 2012-04-21 LAB — CBC WITH DIFFERENTIAL/PLATELET
Basophils Relative: 0 % (ref 0–1)
Eosinophils Absolute: 0 10*3/uL (ref 0.0–0.7)
HCT: 39.9 % (ref 39.0–52.0)
Hemoglobin: 13.4 g/dL (ref 13.0–17.0)
Lymphs Abs: 0.8 10*3/uL (ref 0.7–4.0)
MCH: 33 pg (ref 26.0–34.0)
MCHC: 33.6 g/dL (ref 30.0–36.0)
Monocytes Absolute: 1.4 10*3/uL — ABNORMAL HIGH (ref 0.1–1.0)
Monocytes Relative: 8 % (ref 3–12)
Neutro Abs: 15.1 10*3/uL — ABNORMAL HIGH (ref 1.7–7.7)
Neutrophils Relative %: 87 % — ABNORMAL HIGH (ref 43–77)
RBC: 4.06 MIL/uL — ABNORMAL LOW (ref 4.22–5.81)

## 2012-04-21 LAB — URINALYSIS, ROUTINE W REFLEX MICROSCOPIC
Glucose, UA: NEGATIVE mg/dL
Leukocytes, UA: NEGATIVE
Protein, ur: 30 mg/dL — AB
Specific Gravity, Urine: 1.025 (ref 1.005–1.030)
pH: 5 (ref 5.0–8.0)

## 2012-04-21 LAB — URINE MICROSCOPIC-ADD ON

## 2012-04-21 LAB — CK: Total CK: 6148 U/L — ABNORMAL HIGH (ref 7–232)

## 2012-04-21 LAB — CK TOTAL AND CKMB (NOT AT ARMC): CK, MB: 70.7 ng/mL (ref 0.3–4.0)

## 2012-04-21 LAB — PRO B NATRIURETIC PEPTIDE: Pro B Natriuretic peptide (BNP): 18364 pg/mL — ABNORMAL HIGH (ref 0–450)

## 2012-04-21 MED ORDER — SODIUM CHLORIDE 0.9 % IV BOLUS (SEPSIS)
1000.0000 mL | Freq: Once | INTRAVENOUS | Status: AC
  Administered 2012-04-21: 1000 mL via INTRAVENOUS

## 2012-04-21 MED ORDER — SODIUM CHLORIDE 0.9 % IV SOLN: INTRAVENOUS | Status: DC

## 2012-04-21 MED ORDER — LORAZEPAM 2 MG/ML IJ SOLN: 1.0000 mg | Freq: Four times a day (QID) | INTRAMUSCULAR | Status: AC | PRN

## 2012-04-21 MED ORDER — LORATADINE 10 MG PO TABS
10.0000 mg | ORAL_TABLET | Freq: Every day | ORAL | Status: DC
  Administered 2012-04-22 – 2012-04-25 (×4): 10 mg via ORAL
  Filled 2012-04-21 (×4): qty 1

## 2012-04-21 MED ORDER — LORAZEPAM 1 MG PO TABS: 1.0000 mg | ORAL_TABLET | Freq: Four times a day (QID) | ORAL | Status: AC | PRN

## 2012-04-21 MED ORDER — SODIUM CHLORIDE 0.9 % IJ SOLN
3.0000 mL | Freq: Two times a day (BID) | INTRAMUSCULAR | Status: DC
  Administered 2012-04-23 – 2012-04-25 (×3): 3 mL via INTRAVENOUS

## 2012-04-21 MED ORDER — POLYETHYLENE GLYCOL 3350 17 G PO PACK
17.0000 g | PACK | Freq: Every day | ORAL | Status: DC | PRN
  Filled 2012-04-21: qty 1

## 2012-04-21 MED ORDER — FOLIC ACID 1 MG PO TABS
1.0000 mg | ORAL_TABLET | Freq: Every day | ORAL | Status: DC
  Administered 2012-04-21 – 2012-04-25 (×5): 1 mg via ORAL
  Filled 2012-04-21 (×5): qty 1

## 2012-04-21 MED ORDER — THIAMINE HCL 100 MG/ML IJ SOLN
100.0000 mg | Freq: Every day | INTRAMUSCULAR | Status: DC
  Administered 2012-04-21: 100 mg via INTRAVENOUS
  Filled 2012-04-21 (×5): qty 1

## 2012-04-21 MED ORDER — ASPIRIN 81 MG PO TABS: 81.0000 mg | ORAL_TABLET | Freq: Every day | ORAL | Status: DC

## 2012-04-21 MED ORDER — PANTOPRAZOLE SODIUM 40 MG PO TBEC
40.0000 mg | DELAYED_RELEASE_TABLET | Freq: Every day | ORAL | Status: DC
  Administered 2012-04-21 – 2012-04-25 (×5): 40 mg via ORAL
  Filled 2012-04-21 (×5): qty 1

## 2012-04-21 MED ORDER — AMLODIPINE BESYLATE 10 MG PO TABS
10.0000 mg | ORAL_TABLET | Freq: Every day | ORAL | Status: DC
  Administered 2012-04-21 – 2012-04-25 (×5): 10 mg via ORAL
  Filled 2012-04-21 (×5): qty 1

## 2012-04-21 MED ORDER — LORAZEPAM 2 MG/ML IJ SOLN
0.0000 mg | Freq: Two times a day (BID) | INTRAMUSCULAR | Status: DC
  Administered 2012-04-23: 1 mg via INTRAVENOUS
  Filled 2012-04-21: qty 1

## 2012-04-21 MED ORDER — SODIUM CHLORIDE 0.9 % IV SOLN
Freq: Once | INTRAVENOUS | Status: AC
  Administered 2012-04-21: 16:00:00 via INTRAVENOUS

## 2012-04-21 MED ORDER — SODIUM CHLORIDE 0.9 % IV SOLN
INTRAVENOUS | Status: DC
  Administered 2012-04-21: 22:00:00 via INTRAVENOUS
  Administered 2012-04-22: 100 mL/h via INTRAVENOUS

## 2012-04-21 MED ORDER — SERTRALINE HCL 50 MG PO TABS
50.0000 mg | ORAL_TABLET | Freq: Every day | ORAL | Status: DC
  Administered 2012-04-21 – 2012-04-25 (×5): 50 mg via ORAL
  Filled 2012-04-21 (×5): qty 1

## 2012-04-21 MED ORDER — TRAMADOL HCL 50 MG PO TABS
100.0000 mg | ORAL_TABLET | ORAL | Status: DC | PRN
  Administered 2012-04-22 – 2012-04-24 (×4): 100 mg via ORAL
  Filled 2012-04-21 (×4): qty 2

## 2012-04-21 MED ORDER — METOPROLOL TARTRATE 12.5 MG HALF TABLET
12.5000 mg | ORAL_TABLET | Freq: Two times a day (BID) | ORAL | Status: DC
  Filled 2012-04-21: qty 1

## 2012-04-21 MED ORDER — AMLODIPINE BESYLATE 5 MG PO TABS: 5.0000 mg | ORAL_TABLET | Freq: Every day | ORAL | Status: DC

## 2012-04-21 MED ORDER — TETANUS-DIPHTH-ACELL PERTUSSIS 5-2.5-18.5 LF-MCG/0.5 IM SUSP
0.5000 mL | Freq: Once | INTRAMUSCULAR | Status: AC
  Administered 2012-04-21: 0.5 mL via INTRAMUSCULAR
  Filled 2012-04-21: qty 0.5

## 2012-04-21 MED ORDER — ADULT MULTIVITAMIN W/MINERALS CH
1.0000 | ORAL_TABLET | Freq: Every day | ORAL | Status: DC
  Administered 2012-04-22 – 2012-04-25 (×4): 1 via ORAL
  Filled 2012-04-21 (×4): qty 1

## 2012-04-21 MED ORDER — VITAMIN B-1 100 MG PO TABS
100.0000 mg | ORAL_TABLET | Freq: Every day | ORAL | Status: DC
  Administered 2012-04-22 – 2012-04-25 (×4): 100 mg via ORAL
  Filled 2012-04-21 (×5): qty 1

## 2012-04-21 MED ORDER — TAMSULOSIN HCL 0.4 MG PO CAPS
0.4000 mg | ORAL_CAPSULE | Freq: Every day | ORAL | Status: DC
  Administered 2012-04-21 – 2012-04-25 (×5): 0.4 mg via ORAL
  Filled 2012-04-21 (×5): qty 1

## 2012-04-21 MED ORDER — ASPIRIN EC 81 MG PO TBEC
81.0000 mg | DELAYED_RELEASE_TABLET | Freq: Every day | ORAL | Status: DC
  Administered 2012-04-21 – 2012-04-25 (×5): 81 mg via ORAL
  Filled 2012-04-21 (×5): qty 1

## 2012-04-21 MED ORDER — NITROGLYCERIN 0.4 MG SL SUBL: 0.4000 mg | SUBLINGUAL_TABLET | SUBLINGUAL | Status: DC | PRN

## 2012-04-21 MED ORDER — ZOLPIDEM TARTRATE 5 MG PO TABS
5.0000 mg | ORAL_TABLET | Freq: Every day | ORAL | Status: DC
  Administered 2012-04-22 – 2012-04-24 (×3): 5 mg via ORAL
  Filled 2012-04-21 (×4): qty 1

## 2012-04-21 MED ORDER — LORAZEPAM 2 MG/ML IJ SOLN: 0.0000 mg | Freq: Four times a day (QID) | INTRAMUSCULAR | Status: AC

## 2012-04-21 NOTE — Progress Notes (Addendum)
Patient admitted into room, Alert and oriented, telemetry applied and seen by monitor tech. Dressing applied to open areas on toes. Ciwa protocol in place. Will continue to monitor, attempt to gather sputum culture and orthostatic bps

## 2012-04-21 NOTE — ED Notes (Signed)
Pt transported to CT scan.

## 2012-04-21 NOTE — ED Notes (Signed)
DR.Shubin shown results of Istat trop.

## 2012-04-21 NOTE — Consult Note (Addendum)
Admit date: 04/21/2012 Referring Physician  [facility] Primary Physician  HAL Stoneking M.D. Primary Cardiologist  Gwynneth Albright, M.D. Reason for Consultation:  Elevated troponin  ASSESSMENT: 1. Elevated troponin of uncertain significance. There is no clinical evidence of ongoing myocardial ischemia or infarction. Suspect this stress related.  2. Frailty with a mechanical fall and subsequent prolonged down time greater than 12 hours. Patient is unable ambulate or to function independently at this time  3. Rhabdomyolysis  4. History of CAD with prior coronary bypass grafting  5. History of hypertension  6. Tachy-brady syndrome with DDD pacemaker implantation  7. Pericardial friction rub on today's exam   PLAN:  1. Admission and consideration of rehabilitation  2. Serial cardiac markers including CK-MB  3. Echocardiogram given the pericardial rub noted on clinical exam  4. Care with IV fluid administration given the markedly elevated BNP  HPI: The patient is 32 and well-known to me. Last evening while preparing to go to bed he fell and was unable to get up. He remained on the floor until he was rescued by his daughter some 12-14 hours later. He denies chest pain. He states that when he fell he could feel his heart beating. He denies dyspnea. He has not had recent syncope although in 2000 he had 2 episodes of confusion and near loss of consciousness.   PMH:   Past Medical History  Diagnosis Date  . ASHD (arteriosclerotic heart disease)   . Osteoarthritis   . HTN (hypertension)   . BPH (benign prostatic hyperplasia)      PSH:   Past Surgical History  Procedure Date  . Pacemaker insertion   . Coronary artery bypass graft     Allergies:  Celebrex; Sulfur; and Vioxx Prior to Admit Meds:   (Not in a hospital admission) Fam HX:   History reviewed. No pertinent family history. Social HX:    History   Social History  . Marital Status: Single   Spouse Name: N/A    Number of Children: N/A  . Years of Education: N/A   Occupational History  . Not on file.   Social History Main Topics  . Smoking status: Never Smoker   . Smokeless tobacco: Not on file  . Alcohol Use: No  . Drug Use: No  . Sexually Active:    Other Topics Concern  . Not on file   Social History Narrative  . No narrative on file     Review of Systems: Currently denies chest pain, dyspnea, confusion, headache, abdominal pain, and back discomfort. His feet are heard to he also has some pain in and around his right eye  Physical Exam: Blood pressure 164/60, pulse 72, temperature 97 F (36.1 C), temperature source Rectal, resp. rate 18, SpO2 100.00%. Weight change:   The patient is lying supine on the hospital gurney in the emergency room. There is erythema and swelling around his right eye his lower lip this edema this  No obvious JVD is noted carotid bruits not heard  Cardiac exam reveals a soft 2 component paracardial rub. No gallop or murmur is heard.  Lungs are clear anteriorly.  Abdomen is soft and bowel sounds are normal.  Extremities reveal erythema and injury of the toes on both feet.  Neurologically he is awake, oriented, and able to give a reasonable history and conversation to Labs:   Lab Results  Component Value Date   WBC 17.3* 04/21/2012   HGB 13.4 04/21/2012  HCT 39.9 04/21/2012   MCV 98.3 04/21/2012   PLT 239 04/21/2012    Lab 04/21/12 1550  NA 135  K 4.7  CL 93*  CO2 14*  BUN 42*  CREATININE 1.14  CALCIUM 9.9  PROT 7.5  BILITOT 0.7  ALKPHOS 93  ALT 41  AST 138*  GLUCOSE 154*   No results found for this basename: PTT   Lab Results  Component Value Date   INR 1.32 04/21/2012   INR 1.0 11/06/2006   Lab Results  Component Value Date   CKTOTAL 6148* 04/21/2012     No results found for this basename: CHOL   No results found for this basename: HDL   No results found for this basename: LDLCALC   No results  found for this basename: TRIG   No results found for this basename: CHOLHDL   No results found for this basename: LDLDIRECT      Radiology:  Dg Elbow 2 Views Right  04/21/2012  *RADIOLOGY REPORT*  Clinical Data: Fall with right elbow bruising.  RIGHT ELBOW - 2 VIEW  Comparison: None.  Findings: No definite joint effusion or fracture.  Mild soft tissue swelling along the dorsal aspect of the elbow joint.  IMPRESSION: Mild soft tissue swelling without fracture.   Original Report Authenticated By: Leanna Battles, M.D.    Ct Head Wo Contrast  04/21/2012  *RADIOLOGY REPORT*  Clinical Data:  Fall.  CT HEAD WITHOUT CONTRAST CT MAXILLOFACIAL WITHOUT CONTRAST  Technique:  Multidetector CT imaging of the head and maxillofacial structures were performed using the standard protocol without intravenous contrast. Multiplanar CT image reconstructions of the maxillofacial structures were also generated.  Comparison:  None  CT HEAD  Findings: There is atrophy and chronic small vessel disease changes. No acute intracranial abnormality.  Specifically, no hemorrhage, hydrocephalus, mass lesion, acute infarction, or significant intracranial injury.  No acute calvarial abnormality.  IMPRESSION: No acute intracranial abnormality.  Atrophy, chronic microvascular disease.  CT MAXILLOFACIAL  Findings:   No acute bony abnormality.  No evidence of facial or orbital fracture.  Paranasal sinuses are clear.  Orbital soft tissues unremarkable.  IMPRESSION: No evidence of facial/orbital fracture.   Original Report Authenticated By: Charlett Nose, M.D.    Dg Chest Portable 1 View  04/21/2012  *RADIOLOGY REPORT*  Clinical Data: Fall with bruising.  Loss of consciousness.  PORTABLE CHEST - 1 VIEW  Comparison: 11/06/2006.  Findings: Trachea is midline.  Heart size is accentuated by AP semi upright technique and low lung volumes.  Right subclavian pacemaker lead tips project over the right atrium and right ventricle. Biapical pleural  parenchymal scarring.  Mild bibasilar air space disease, left greater than right.  No definite pleural fluid.  Post- traumatic or postsurgical changes involving the distal left clavicle, unchanged.  IMPRESSION: Low lung volumes with mild bibasilar air space disease, left greater than right, possibly due to atelectasis.  Aspiration and pneumonia are not excluded.   Original Report Authenticated By: Leanna Battles, M.D.    Dg Foot 2 Views Left  04/21/2012  *RADIOLOGY REPORT*  Clinical Data: Fall.  Lacerations and bruising of the toes.  LEFT FOOT - 2 VIEW  Comparison: None.  Findings: On this two-view exam, the toes are overlapping, limiting evaluation for fractures.  There is atherosclerotic calcification of the arteries of the feet.  No obvious acute fracture identified. Note is made of a plantar calcaneal spur.  IMPRESSION:  1.  Although no obvious fracture identified, the toes are overlapping and  phalangeal fractures are not excluded.  See above. 2.  Considered dedicated digit views as needed.   Original Report Authenticated By: Norva Pavlov, M.D.    Dg Foot 2 Views Right  04/21/2012  *RADIOLOGY REPORT*  Clinical Data: Fall.  Lacerations and bruising of the toes and distal foot.  RIGHT FOOT - 2 VIEW  Comparison: None.  Findings: Two views are performed, showing overlapping of the toes. This limits full evaluation of the toes for fracture.  However, there is a lytic lesion involving the distal phalanx of the second digit, associated with soft tissue density.  Findings raise a question of osteomyelitis or other lesions such as metastasis. There are atherosclerotic calcifications of the arteries of the feet.  IMPRESSION:  1.  No evidence for acute fracture. 2.  Suspicious lytic lesion involving the distal phalanx of the second digit.  See above.   Original Report Authenticated By: Norva Pavlov, M.D.    Ct Maxillofacial Wo Cm  04/21/2012  *RADIOLOGY REPORT*  Clinical Data:  Fall.  CT HEAD WITHOUT  CONTRAST CT MAXILLOFACIAL WITHOUT CONTRAST  Technique:  Multidetector CT imaging of the head and maxillofacial structures were performed using the standard protocol without intravenous contrast. Multiplanar CT image reconstructions of the maxillofacial structures were also generated.  Comparison:  None  CT HEAD  Findings: There is atrophy and chronic small vessel disease changes. No acute intracranial abnormality.  Specifically, no hemorrhage, hydrocephalus, mass lesion, acute infarction, or significant intracranial injury.  No acute calvarial abnormality.  IMPRESSION: No acute intracranial abnormality.  Atrophy, chronic microvascular disease.  CT MAXILLOFACIAL  Findings:   No acute bony abnormality.  No evidence of facial or orbital fracture.  Paranasal sinuses are clear.  Orbital soft tissues unremarkable.  IMPRESSION: No evidence of facial/orbital fracture.   Original Report Authenticated By: Charlett Nose, M.D.    EKG:  AV sequential pacing    Lesleigh Noe 04/21/2012 6:39 PM

## 2012-04-21 NOTE — H&P (Signed)
Hospital Admission Note Date: 04/21/2012  Patient name: Caleb Nash Medical record number: [id number] Date of birth: [birth year] Age: 78 y.o. Gender: male PCP: No primary provider on file.   Attending physician:  Caleb Nash   Internal Medicine Teaching Service Contact Information  1st Contact: Caleb Nash   Pager: [phone] 2nd Contact: Caleb Nash   Pager: [phone]  After 5 pm or weekends: 1st Contact: Pager: [phone] 2nd Contact: Pager: [phone]   Chief Complaint: fall  History of Present Illness:  Caleb Nash is a 78 yo gentleman with a history of CAD s/p CABG, tachybrady syndrome s/p pacermaker, HTN, OA, and BPH who presents after a fall at his assisted living facility. He fell the day before admission and was found approximately 14 hours later. Daughter last spoke with him the evening before and found him at 2pm the next day after he did not return her phone calls. The patient states that he does not remember anything before the fall, does not recall having palpitations, lightheadedness, chest pain, or respiratory distress. He states he was quite confused over night, though he realized he had fallen, he thought he was upright and tried to run to the next room to get to the phone. He was unable to move and stayed face down in the bathroom most of the night. Currently, he endorses b/l toe pain with wounds on both feet, facial swelling involving the R periorbital region and lower lip.  Denies chest pain, shortness of breath, palpitations, dizziness.  He has had an upper respiratory illness this week with cough productive with brown-white sputum, decreased energy level, and diminished appetite. He saw his PCP, Caleb Nash, on Thursday and has been taking Robitussin and Mucinex. He has been drinking only water since Dec 25th, does not remember any other intake.  ROS+ for constipation this week for which he takes miralax, also decreased PO intake and appetite ROS negative for fever or  chills, no nausea or vomiting, no diarrhea.  Social history significant for daily vodka use, states last drink was 2 days ago. No illicit drugs.   Meds: Current Outpatient Rx  Name  Route  Sig  Dispense  Refill  . TYLENOL ARTHRITIS PAIN PO   Oral   Take 2 tablets by mouth every 8 (eight) hours as needed. For pain         . ASPIRIN 81 MG PO TABS   Oral   Take 81 mg by mouth daily.         Marland Kitchen DICLOFENAC SODIUM 1 % TD GEL   Topical   Apply topically.         Marland Kitchen LISINOPRIL 20 MG PO TABS   Oral   Take 20 mg by mouth 2 (two) times daily.         Marland Kitchen LORATADINE 10 MG PO TABS   Oral   Take 10 mg by mouth daily.         . ICAPS MV PO   Oral   Take 1 tablet by mouth daily.         Marland Kitchen NITROGLYCERIN 0.4 MG SL SUBL   Sublingual   Place 0.4 mg under the tongue every 5 (five) minutes as needed. For chest pain         . OMEPRAZOLE 20 MG PO CPDR   Oral   Take 20 mg by mouth daily.         Marland Kitchen POLYETHYLENE GLYCOL 3350 PO PACK   Oral   Take 17  g by mouth daily as needed. For constipation         . SERTRALINE HCL 50 MG PO TABS   Oral   Take 50 mg by mouth daily.         Marland Kitchen TAMSULOSIN HCL 0.4 MG PO CAPS   Oral   Take 0.4 mg by mouth daily.         . TRAMADOL HCL 50 MG PO TABS   Oral   Take 100 mg by mouth every 4 (four) hours as needed. For pain         . ZOLPIDEM TARTRATE 5 MG PO TABS   Oral   Take 5 mg by mouth at bedtime.           Allergies: Allergies as of 04/21/2012 - Review Complete 04/21/2012  Allergen Reaction Noted  . Celebrex (celecoxib) Other (See Comments) 04/21/2012  . Sulfur Other (See Comments) 04/21/2012  . Vioxx (rofecoxib) Other (See Comments) 04/21/2012   Past Medical History  Diagnosis Date  . ASHD (arteriosclerotic heart disease)   . Osteoarthritis   . HTN (hypertension)   . BPH (benign prostatic hyperplasia)    Past Surgical History  Procedure Date  . Pacemaker insertion   . Coronary artery bypass graft    History  reviewed. No pertinent family history. History   Social History  . Marital Status: Single    Spouse Name: N/A    Number of Children: N/A  . Years of Education: N/A   Occupational History  . Not on file.   Social History Main Topics  . Smoking status: Never Smoker   . Smokeless tobacco: Not on file  . Alcohol Use: No  . Drug Use: No  . Sexually Active:    Other Topics Concern  . Not on file   Social History Narrative  . No narrative on file    Review of Systems: ROS negative except as noted in HPI  Physical Exam Blood pressure 164/60, pulse 70, temperature 97 F (36.1 C), temperature source Rectal, resp. rate 18, SpO2 100.00%. General:  No acute distress, alert and oriented, responds to questions, cooperative with exam HEENT:  Pupils 2mm, ERRL, EOMI, dry mucous membranes. Right periorbital nonblanching erythema, lower lip edema Cardiovascular:  Regular rate and rhythm. 2 component rub best heard at apex. No other murmurs noted Respiratory:  Clear to auscultation anteriorly, no wheezes, rales, or rhonchi Abdomen:  Soft, nondistended, nontender, bowel sounds present Extremities:  Warm and well-perfused, pedal pulses intact. No compartment syndrome. No lower extremity edema. B/l feet with nonblanching erythema and abrasion injuries. Skin: Warm, dry, no rashes Neuro: Not anxious appearing, no depressed mood, normal affect. Strength 4/5 throughout, symmetric. No facial droop.    Lab results: Basic Metabolic Panel:  Basename 04/21/12 1550  NA 135  K 4.7  CL 93*  CO2 14*  GLUCOSE 154*  BUN 42*  CREATININE 1.14  CALCIUM 9.9  MG --  PHOS --   Liver Function Tests:  [facility] 04/21/12 1550  AST 138*  ALT 41  ALKPHOS 93  BILITOT 0.7  PROT 7.5  ALBUMIN 3.4*   CBC:  Basename 04/21/12 1550  WBC 17.3*  NEUTROABS 15.1*  HGB 13.4  HCT 39.9  MCV 98.3  PLT 239   Cardiac Enzymes:  Basename 04/21/12 1550  CKTOTAL 6148*  CKMB --  CKMBINDEX --  TROPONINI --     BNP:  Basename 04/21/12 1551  PROBNP 18364.0*   Coagulation:  Basename 04/21/12 1550  LABPROT 16.1*  INR 1.32   Urinalysis:  Basename 04/21/12 1759  COLORURINE YELLOW  LABSPEC 1.025  PHURINE 5.0  GLUCOSEU NEGATIVE  HGBUR LARGE*  BILIRUBINUR SMALL*  KETONESUR 40*  PROTEINUR 30*  UROBILINOGEN 0.2  NITRITE NEGATIVE  LEUKOCYTESUR NEGATIVE     Imaging results:  Dg Elbow 2 Views Right  04/21/2012  *RADIOLOGY REPORT*  Clinical Data: Fall with right elbow bruising.  RIGHT ELBOW - 2 VIEW  Comparison: None.  Findings: No definite joint effusion or fracture.  Mild soft tissue swelling along the dorsal aspect of the elbow joint.  IMPRESSION: Mild soft tissue swelling without fracture.   Original Report Authenticated By: Leanna Battles, M.D.    Ct Head Wo Contrast  04/21/2012  *RADIOLOGY REPORT*  Clinical Data:  Fall.  CT HEAD WITHOUT CONTRAST CT MAXILLOFACIAL WITHOUT CONTRAST  Technique:  Multidetector CT imaging of the head and maxillofacial structures were performed using the standard protocol without intravenous contrast. Multiplanar CT image reconstructions of the maxillofacial structures were also generated.  Comparison:  None  CT HEAD  Findings: There is atrophy and chronic small vessel disease changes. No acute intracranial abnormality.  Specifically, no hemorrhage, hydrocephalus, mass lesion, acute infarction, or significant intracranial injury.  No acute calvarial abnormality.  IMPRESSION: No acute intracranial abnormality.  Atrophy, chronic microvascular disease.  CT MAXILLOFACIAL  Findings:   No acute bony abnormality.  No evidence of facial or orbital fracture.  Paranasal sinuses are clear.  Orbital soft tissues unremarkable.  IMPRESSION: No evidence of facial/orbital fracture.   Original Report Authenticated By: Charlett Nose, M.D.    Dg Chest Portable 1 View  04/21/2012  *RADIOLOGY REPORT*  Clinical Data: Fall with bruising.  Loss of consciousness.  PORTABLE CHEST - 1 VIEW   Comparison: 11/06/2006.  Findings: Trachea is midline.  Heart size is accentuated by AP semi upright technique and low lung volumes.  Right subclavian pacemaker lead tips project over the right atrium and right ventricle. Biapical pleural parenchymal scarring.  Mild bibasilar air space disease, left greater than right.  No definite pleural fluid.  Post- traumatic or postsurgical changes involving the distal left clavicle, unchanged.  IMPRESSION: Low lung volumes with mild bibasilar air space disease, left greater than right, possibly due to atelectasis.  Aspiration and pneumonia are not excluded.   Original Report Authenticated By: Leanna Battles, M.D.    Dg Foot 2 Views Left  04/21/2012  *RADIOLOGY REPORT*  Clinical Data: Fall.  Lacerations and bruising of the toes.  LEFT FOOT - 2 VIEW  Comparison: None.  Findings: On this two-view exam, the toes are overlapping, limiting evaluation for fractures.  There is atherosclerotic calcification of the arteries of the feet.  No obvious acute fracture identified. Note is made of a plantar calcaneal spur.  IMPRESSION:  1.  Although no obvious fracture identified, the toes are overlapping and phalangeal fractures are not excluded.  See above. 2.  Considered dedicated digit views as needed.   Original Report Authenticated By: Norva Pavlov, M.D.    Dg Foot 2 Views Right  04/21/2012  *RADIOLOGY REPORT*  Clinical Data: Fall.  Lacerations and bruising of the toes and distal foot.  RIGHT FOOT - 2 VIEW  Comparison: None.  Findings: Two views are performed, showing overlapping of the toes. This limits full evaluation of the toes for fracture.  However, there is a lytic lesion involving the distal phalanx of the second digit, associated with soft tissue density.  Findings raise a question of osteomyelitis or other lesions such as  metastasis. There are atherosclerotic calcifications of the arteries of the feet.  IMPRESSION:  1.  No evidence for acute fracture. 2.  Suspicious  lytic lesion involving the distal phalanx of the second digit.  See above.   Original Report Authenticated By: Norva Pavlov, M.D.    Ct Maxillofacial Wo Cm  04/21/2012  *RADIOLOGY REPORT*  Clinical Data:  Fall.  CT HEAD WITHOUT CONTRAST CT MAXILLOFACIAL WITHOUT CONTRAST  Technique:  Multidetector CT imaging of the head and maxillofacial structures were performed using the standard protocol without intravenous contrast. Multiplanar CT image reconstructions of the maxillofacial structures were also generated.  Comparison:  None  CT HEAD  Findings: There is atrophy and chronic small vessel disease changes. No acute intracranial abnormality.  Specifically, no hemorrhage, hydrocephalus, mass lesion, acute infarction, or significant intracranial injury.  No acute calvarial abnormality.  IMPRESSION: No acute intracranial abnormality.  Atrophy, chronic microvascular disease.  CT MAXILLOFACIAL  Findings:   No acute bony abnormality.  No evidence of facial or orbital fracture.  Paranasal sinuses are clear.  Orbital soft tissues unremarkable.  IMPRESSION: No evidence of facial/orbital fracture.   Original Report Authenticated By: Charlett Nose, M.D.     Other results: EKG: RAD, paced rhythm, regular, nonspecific T wave flattening in V5-6  Assessment & Plan by Problem:  Patient is 78 year old gentleman with past medical history of CAD s/p CABG in 2005, tachybrady syndrome s/p pacemaker placement, depression, hypertension, who presents with  elevated CK after a fall and elevated troponin. ProBNP [id number].  CT head without  new intracranial abnormalities.  #. Rhabdomyolysis: presented after a fall at ALF  and was found approximately 14 hours later, laying on R side, face down.  He was found to have total CK of 6148 on admission.  Urinalysis showed large  hemoglobin without red blood cells on microscopic exam, which is consistent with rhabdomyolysis. Currently patient's kidney function is within normal limits,  creatinine 1.14. BUN is 42. K is normal. Patient's bicarbonate is 14 on initial BMP.   Plan -will admit to med surg -Will hold lisinopril and a baclofenac due to the posssibility of worsening renal function.  -IV NS was started in ED at 125 ml/h, will decrease to 100 ml/h given kidney significantly elevated proBNP and hx of CAD -will check phosphorus and magnesium. -Follow BMP for renal function  #: CAD:  Patient is s/p CABG in 2005. Currently patient does not have any chest pain. His initial troponin was elevated at 0.55. Cardiology was consulted in ED.  Dr. Verdis Prime evaluated the patient and did pacemaker interrogation. Per Dr. Katrinka Blazing, elevated troponin is suspected to be stress related.  2D echo was ordered.  - Appreciate cardiology's consultation in managing our patient - will continue cycle troponin and check CK-MB. - Repeat EKG in AM -check FLP, A1c  #: Fall: Patient had an witnessed fall. The etiology is not clear. Potential DD includes cardiac arrhythmia, alcohol intoxication, orthostatic hypotension with dehydration and recent decreased PO intake.  CT head without intracranial abnormalities. No fractures seen on facial CT -will check orthostatic vital signs - pending 2D echo -follow cardiac markers - IV fluid, NS 100 ml/h -consult wound care for foot injury  #Anion Gap Metabolic Acidosis Most likely 2/2 Rhabdomyolysis, but may also be from alcohol abuse. AG of 28 on admission. -cont to monitor and treat rhabdo as above  #:  Alcohol abuse: patient drinks almost every day. His CMP showed AST of 138 and ALT of 41 which is consistent  with alcohol-induced liver damage. Last drink reported to be 2 days ago. -will start CIWA protocol -will give thiamine and folate -will consult social work  #: Pericarditis: patient has pericardial friction rub on physical examination. He is currently asymptomatic without any chest pain. NSIADs would be typical therapy for pericarditis, however,  given patient's rhabdomyolysis and possible worsening renal function, will hold NSIADs at this time.  -Pending 2D echo -Will consider to treat with steroid if get worse, such as developing severe chest pain.  #:  Cough: Patient has productive cough without fever or chills. There is no chest pain. It is less likely that the patient has pneumonia though portable CXR shows low lung volumes with mild bibasilar air space disease.  -will get 2 view CXR for better evaluation -robitussin prn  #:  HTN: bp is 164/60 on admission.  Patient has been on lisinopril 20 mg daily at home. -Will hold lisinopril giving rhabdomyolysis and a possible worsening of renal function -Will start Norvasc 10 mg daily.  #. DVT PPX: SCD   Dispo: Disposition is deferred at this time, awaiting improvement of current medical problems. Anticipated discharge in approximately 2-4 day(s).   The patient does have a current PCP, therefore will not be requiring OPC follow-up after discharge.    Signed: Denton Ar 04/21/2012, 7:19 PM

## 2012-04-21 NOTE — ED Notes (Signed)
Per EMS Pt went to the bathroom and fell on his face but was unable to reach the pull cord for help and was on the floor since 2200 04/20/12. Came from [facility] and lives in the independent section. Pt was found laying on face in water and drool. Visible swelling and redness to the rt eye and cheek and scrapes and bruising to both feet, rt great toe nail is bleeding and appears to be coming off. Vitals 126/64, 74 HR, 100% RA CBG 155.

## 2012-04-21 NOTE — ED Notes (Signed)
2nd and 3rd R toes buddy taped together per EDP order. Pt tolerated without difficulty.

## 2012-04-21 NOTE — ED Notes (Signed)
Pt has medtronic pacemaker, charge RN called to interrogate.

## 2012-04-21 NOTE — ED Notes (Signed)
Floor unable to take report at the time. Waiting on 3rd shift nurse.

## 2012-04-21 NOTE — ED Notes (Signed)
Pt presents to department for evaluation of fall. Pt from [facility], states he fell while attempting to get to bathroom, could not reach pull cord, pt was found laying on floor this afternoon face down. States he was on floor since 22:00 last night. Upon arrival bruising noted to bilateral knees and elbows. Abrasions noted to bilateral toes/feet and swelling noted to R eye. States he attempted to pull himself on carpet, but was unable to get up. He is able to move all extremities without difficulty. He is conscious alert and oriented, can answer questions appropriately. Family states recent head congestion and cold.

## 2012-04-21 NOTE — ED Provider Notes (Signed)
History     CSN: [id number]  Arrival date & time 04/21/12  1509   First MD Initiated Contact with Patient 04/21/12 1512      No chief complaint on file.    HPI complaint fall. Onset last night. Location at his assisted living center. Duration greater than 12 hours estimated by patient. Context: The patient is unsure if he had a mechanical fall or if he had a syncopal episode. Regarding social history patient does not drink or smoke. I have reviewed patient's past medical, past surgical, social history as well as medications and allergies.  No past medical history on file.  No past surgical history on file.  No family history on file.  History  Substance Use Topics  . Smoking status: Not on file  . Smokeless tobacco: Not on file  . Alcohol Use: Not on file      Review of Systems  Constitutional: Positive for appetite change. Negative for fever, chills, fatigue and unexpected weight change.  HENT: Positive for facial swelling. Negative for hearing loss, ear pain, nosebleeds, congestion, sore throat, rhinorrhea, drooling, mouth sores, trouble swallowing, neck pain, neck stiffness, dental problem, voice change, sinus pressure, tinnitus and ear discharge.   Eyes: Negative for photophobia, pain, discharge, redness and visual disturbance.  Respiratory: Positive for shortness of breath. Negative for cough, chest tightness, wheezing and stridor.   Cardiovascular: Positive for chest pain. Negative for palpitations and leg swelling.       Chest wall tenderness anteriorly.   Gastrointestinal: Negative for nausea, vomiting, abdominal pain, diarrhea, constipation, blood in stool and abdominal distention.  Genitourinary: Negative for dysuria, urgency, frequency, hematuria, flank pain, decreased urine volume and difficulty urinating.  Musculoskeletal: Negative for back pain, joint swelling and gait problem.  Skin: Negative for pallor and rash.  Neurological: Negative for dizziness, tremors,  seizures, syncope, facial asymmetry, speech difficulty, weakness, light-headedness, numbness and headaches.  Hematological: Negative for adenopathy. Does not bruise/bleed easily.  Psychiatric/Behavioral: Negative for confusion.    Allergies  Review of patient's allergies indicates not on file.  Home Medications  No current outpatient prescriptions on file.  There were no vitals taken for this visit.  Physical Exam  Constitutional: He is oriented to person, place, and time. He appears well-developed and well-nourished. No distress.  HENT:  Head: Normocephalic. Head is with contusion. Head is without Battle's sign, without abrasion and without laceration. Hair is normal.  Right Ear: Hearing, tympanic membrane, external ear and ear canal normal.  Left Ear: Hearing, tympanic membrane, external ear and ear canal normal.  Nose: Nose normal. Right sinus exhibits no maxillary sinus tenderness and no frontal sinus tenderness. Left sinus exhibits no maxillary sinus tenderness and no frontal sinus tenderness.  Mouth/Throat: Uvula is midline and oropharynx is clear and moist. Mucous membranes are dry and not cyanotic.       R minimal periorbital ecchymosis and edema. Patient to lower inner lip with moderate lower lip edema. Lacerations noted.  Eyes: Conjunctivae normal and EOM are normal. Pupils are equal, round, and reactive to light.       Duples 2 mm and reactive bilaterally.  Neck: Normal range of motion. Neck supple. No JVD present. No tracheal deviation present. No thyromegaly present.  Cardiovascular: Normal rate, regular rhythm, normal heart sounds and intact distal pulses.   No murmur heard. Pulmonary/Chest: Effort normal and breath sounds normal. No stridor. No respiratory distress. He has no wheezes. He has no rales. He exhibits tenderness.  Abdominal: Soft. Bowel sounds  are normal. He exhibits no distension and no mass. There is no tenderness. There is no rebound and no guarding.    Musculoskeletal: Normal range of motion. He exhibits tenderness. He exhibits no edema.       Right shoulder: Normal.       Left shoulder: Normal.       Right elbow: He exhibits swelling. He exhibits normal range of motion, no effusion, no deformity and no laceration. tenderness found.       Left elbow: Normal.       Right wrist: Normal.       Left wrist: Normal.       Right hip: Normal.       Left hip: Normal.       Right knee: Normal.       Left knee: Normal.       Right ankle: Normal.       Left ankle: Normal.       Cervical back: Normal.       Thoracic back: Normal.       Lumbar back: Normal.       Right upper arm: Normal.       Left upper arm: Normal.       Right forearm: Normal.       Left forearm: Normal.       Right hand: Normal.       Left hand: Normal.       Right upper leg: Normal.       Left upper leg: Normal.       Right lower leg: Normal.       Left lower leg: Normal.       Right foot: Normal.       Left foot: Normal.  Lymphadenopathy:    He has no cervical adenopathy.  Neurological: He is alert and oriented to person, place, and time. He has normal strength. No cranial nerve deficit or sensory deficit. Coordination normal. GCS eye subscore is 4. GCS verbal subscore is 5. GCS motor subscore is 6.  Skin: Skin is warm and dry. He is not diaphoretic.       Numerous abrasions to bilateral toes. No active bleeding. Feet are warm. Neurovascular intact. 1+ DP and PT pulses bilaterally.  Psychiatric: He has a normal mood and affect.    ED Course  Procedures (including critical care time)  Labs Reviewed  CBC WITH DIFFERENTIAL - Abnormal; Notable for the following:    WBC 17.3 (*)     RBC 4.06 (*)     Neutrophils Relative 87 (*)     Neutro Abs 15.1 (*)     Lymphocytes Relative 5 (*)     Monocytes Absolute 1.4 (*)     All other components within normal limits  COMPREHENSIVE METABOLIC PANEL - Abnormal; Notable for the following:    Chloride 93 (*)     CO2 14 (*)      Glucose, Bld 154 (*)     BUN 42 (*)     Albumin 3.4 (*)     AST 138 (*)     GFR calc non Af Amer 52 (*)     GFR calc Af Amer 61 (*)     All other components within normal limits  PRO B NATRIURETIC PEPTIDE - Abnormal; Notable for the following:    Pro B Natriuretic peptide (BNP) 18364.0 (*)     All other components within normal limits  CK - Abnormal; Notable for the following:  Total CK 6148 (*)     All other components within normal limits  PROTIME-INR - Abnormal; Notable for the following:    Prothrombin Time 16.1 (*)     All other components within normal limits  POCT I-STAT TROPONIN I - Abnormal; Notable for the following:    Troponin i, poc 0.55 (*)     All other components within normal limits  URINALYSIS, ROUTINE W REFLEX MICROSCOPIC - Abnormal; Notable for the following:    APPearance CLOUDY (*)     Hgb urine dipstick LARGE (*)     Bilirubin Urine SMALL (*)     Ketones, ur 40 (*)     Protein, ur 30 (*)     All other components within normal limits  URINE MICROSCOPIC-ADD ON - Abnormal; Notable for the following:    Casts HYALINE CASTS (*)     All other components within normal limits  CK TOTAL AND CKMB - Abnormal; Notable for the following:    Total CK 5685 (*)     CK, MB 70.7 (*)     All other components within normal limits  TROPONIN I - Abnormal; Notable for the following:    Troponin I 0.32 (*)     All other components within normal limits  LIPID PANEL - Abnormal; Notable for the following:    HDL 35 (*)     All other components within normal limits  MAGNESIUM  PHOSPHORUS  TROPONIN I  TROPONIN I  CULTURE, EXPECTORATED SPUTUM-ASSESSMENT  BASIC METABOLIC PANEL  CBC  LIPID PANEL  HEMOGLOBIN A1C  CK TOTAL AND CKMB   Dg Chest 2 View  04/21/2012  *RADIOLOGY REPORT*  Clinical Data: Shortness of breath.  Syncope.  CHEST - 2 VIEW  Comparison: 04/21/2012  Findings: Improved aeration of both lungs is seen, with resolution of left retrocardiac atelectasis.  No  evidence of pulmonary infiltrate or edema.  No evidence of pleural effusion.  Mild cardiomegaly stable.  Dual lead transvenous pacemaker remains in appropriate position.  Prior CABG again noted. Pulmonary hyperinflation is consistent with COPD.  IMPRESSION:  1.  Improved aeration with resolution of left retrocardiac atelectasis. 2.  COPD.  No acute findings.   Original Report Authenticated By: Myles Rosenthal, M.D.    Dg Elbow 2 Views Right  04/21/2012  *RADIOLOGY REPORT*  Clinical Data: Fall with right elbow bruising.  RIGHT ELBOW - 2 VIEW  Comparison: None.  Findings: No definite joint effusion or fracture.  Mild soft tissue swelling along the dorsal aspect of the elbow joint.  IMPRESSION: Mild soft tissue swelling without fracture.   Original Report Authenticated By: Leanna Battles, M.D.    Ct Head Wo Contrast  04/21/2012  *RADIOLOGY REPORT*  Clinical Data:  Fall.  CT HEAD WITHOUT CONTRAST CT MAXILLOFACIAL WITHOUT CONTRAST  Technique:  Multidetector CT imaging of the head and maxillofacial structures were performed using the standard protocol without intravenous contrast. Multiplanar CT image reconstructions of the maxillofacial structures were also generated.  Comparison:  None  CT HEAD  Findings: There is atrophy and chronic small vessel disease changes. No acute intracranial abnormality.  Specifically, no hemorrhage, hydrocephalus, mass lesion, acute infarction, or significant intracranial injury.  No acute calvarial abnormality.  IMPRESSION: No acute intracranial abnormality.  Atrophy, chronic microvascular disease.  CT MAXILLOFACIAL  Findings:   No acute bony abnormality.  No evidence of facial or orbital fracture.  Paranasal sinuses are clear.  Orbital soft tissues unremarkable.  IMPRESSION: No evidence of facial/orbital fracture.   Original  Report Authenticated By: Charlett Nose, M.D.    Dg Chest Portable 1 View  04/21/2012  *RADIOLOGY REPORT*  Clinical Data: Fall with bruising.  Loss of  consciousness.  PORTABLE CHEST - 1 VIEW  Comparison: 11/06/2006.  Findings: Trachea is midline.  Heart size is accentuated by AP semi upright technique and low lung volumes.  Right subclavian pacemaker lead tips project over the right atrium and right ventricle. Biapical pleural parenchymal scarring.  Mild bibasilar air space disease, left greater than right.  No definite pleural fluid.  Post- traumatic or postsurgical changes involving the distal left clavicle, unchanged.  IMPRESSION: Low lung volumes with mild bibasilar air space disease, left greater than right, possibly due to atelectasis.  Aspiration and pneumonia are not excluded.   Original Report Authenticated By: Leanna Battles, M.D.    Dg Foot 2 Views Left  04/21/2012  *RADIOLOGY REPORT*  Clinical Data: Fall.  Lacerations and bruising of the toes.  LEFT FOOT - 2 VIEW  Comparison: None.  Findings: On this two-view exam, the toes are overlapping, limiting evaluation for fractures.  There is atherosclerotic calcification of the arteries of the feet.  No obvious acute fracture identified. Note is made of a plantar calcaneal spur.  IMPRESSION:  1.  Although no obvious fracture identified, the toes are overlapping and phalangeal fractures are not excluded.  See above. 2.  Considered dedicated digit views as needed.   Original Report Authenticated By: Norva Pavlov, M.D.    Dg Foot 2 Views Right  04/21/2012  *RADIOLOGY REPORT*  Clinical Data: Fall.  Lacerations and bruising of the toes and distal foot.  RIGHT FOOT - 2 VIEW  Comparison: None.  Findings: Two views are performed, showing overlapping of the toes. This limits full evaluation of the toes for fracture.  However, there is a lytic lesion involving the distal phalanx of the second digit, associated with soft tissue density.  Findings raise a question of osteomyelitis or other lesions such as metastasis. There are atherosclerotic calcifications of the arteries of the feet.  IMPRESSION:  1.  No  evidence for acute fracture. 2.  Suspicious lytic lesion involving the distal phalanx of the second digit.  See above.   Original Report Authenticated By: Norva Pavlov, M.D.    Ct Maxillofacial Wo Cm  04/21/2012  *RADIOLOGY REPORT*  Clinical Data:  Fall.  CT HEAD WITHOUT CONTRAST CT MAXILLOFACIAL WITHOUT CONTRAST  Technique:  Multidetector CT imaging of the head and maxillofacial structures were performed using the standard protocol without intravenous contrast. Multiplanar CT image reconstructions of the maxillofacial structures were also generated.  Comparison:  None  CT HEAD  Findings: There is atrophy and chronic small vessel disease changes. No acute intracranial abnormality.  Specifically, no hemorrhage, hydrocephalus, mass lesion, acute infarction, or significant intracranial injury.  No acute calvarial abnormality.  IMPRESSION: No acute intracranial abnormality.  Atrophy, chronic microvascular disease.  CT MAXILLOFACIAL  Findings:   No acute bony abnormality.  No evidence of facial or orbital fracture.  Paranasal sinuses are clear.  Orbital soft tissues unremarkable.  IMPRESSION: No evidence of facial/orbital fracture.   Original Report Authenticated By: Charlett Nose, M.D.      1. Pericarditis   2. CAD (coronary artery disease)   3. Elevated troponin   4. Tachy-brady syndrome       MDM   Patient is a 78 year old male brought by EMS after a fall last night. Patient is  unsure if this was syncopal or mechanical in nature. Patient did spend  the night on the floor. Patient has abrasions to his toes stating he was trying to push with his toes to himself into the bathroom where he couldn't pull his call light. He was unable to ambulate given severe arthritis.  Possible LOC with this event. Mildly elevated troponin. EKG is not meet STEMI criteria. Elevated BNP the patient does not appear volume overloaded on exam. Elevated CPK likely secondary to prolonged laying on a hard floor. Negative the  hospital. Cardiology was counseled to use one deems patient not a candidate for cardiac catheterization.   Consuello Masse, MD 04/22/12 [phone]

## 2012-04-22 DIAGNOSIS — R7989 Other specified abnormal findings of blood chemistry: Secondary | ICD-10-CM | POA: Diagnosis not present

## 2012-04-22 DIAGNOSIS — I214 Non-ST elevation (NSTEMI) myocardial infarction: Secondary | ICD-10-CM | POA: Diagnosis not present

## 2012-04-22 DIAGNOSIS — I509 Heart failure, unspecified: Secondary | ICD-10-CM | POA: Diagnosis not present

## 2012-04-22 DIAGNOSIS — I300 Acute nonspecific idiopathic pericarditis: Secondary | ICD-10-CM | POA: Diagnosis not present

## 2012-04-22 DIAGNOSIS — I471 Supraventricular tachycardia: Secondary | ICD-10-CM | POA: Diagnosis not present

## 2012-04-22 DIAGNOSIS — I495 Sick sinus syndrome: Secondary | ICD-10-CM | POA: Diagnosis not present

## 2012-04-22 DIAGNOSIS — I319 Disease of pericardium, unspecified: Secondary | ICD-10-CM | POA: Diagnosis not present

## 2012-04-22 DIAGNOSIS — I2510 Atherosclerotic heart disease of native coronary artery without angina pectoris: Secondary | ICD-10-CM | POA: Diagnosis not present

## 2012-04-22 LAB — BASIC METABOLIC PANEL
CO2: 15 mEq/L — ABNORMAL LOW (ref 19–32)
Calcium: 9.1 mg/dL (ref 8.4–10.5)
Chloride: 100 mEq/L (ref 96–112)
Glucose, Bld: 138 mg/dL — ABNORMAL HIGH (ref 70–99)
Potassium: 3.8 mEq/L (ref 3.5–5.1)
Sodium: 138 mEq/L (ref 135–145)

## 2012-04-22 LAB — LIPID PANEL
Cholesterol: 149 mg/dL (ref 0–200)
Total CHOL/HDL Ratio: 4.3 RATIO
Triglycerides: 89 mg/dL (ref <150)
VLDL: 18 mg/dL (ref 0–40)

## 2012-04-22 LAB — CBC
Hemoglobin: 12.6 g/dL — ABNORMAL LOW (ref 13.0–17.0)
MCV: 97.4 fL (ref 78.0–100.0)
Platelets: 255 10*3/uL (ref 150–400)
RBC: 3.82 MIL/uL — ABNORMAL LOW (ref 4.22–5.81)
WBC: 16 10*3/uL — ABNORMAL HIGH (ref 4.0–10.5)

## 2012-04-22 LAB — CK TOTAL AND CKMB (NOT AT ARMC)
CK, MB: 46.3 ng/mL (ref 0.3–4.0)
Relative Index: 1.4 (ref 0.0–2.5)
Total CK: 3402 U/L — ABNORMAL HIGH (ref 7–232)

## 2012-04-22 LAB — PHOSPHORUS: Phosphorus: 4.3 mg/dL (ref 2.3–4.6)

## 2012-04-22 LAB — TROPONIN I: Troponin I: 0.32 ng/mL (ref <0.30)

## 2012-04-22 MED ORDER — GUAIFENESIN ER 600 MG PO TB12
600.0000 mg | ORAL_TABLET | Freq: Two times a day (BID) | ORAL | Status: DC
  Administered 2012-04-22 – 2012-04-25 (×6): 600 mg via ORAL
  Filled 2012-04-22 (×9): qty 1

## 2012-04-22 NOTE — Consult Note (Addendum)
WOC consult Note Reason for Consult:Traumatic injuries to bilateral feet/toes following period of being "down" and attempting to "dig in" to carpet while crawling for help Wound type: traumatic injuries to bilateral toes: right RGT and 2-4 digits; Left: LGT and 2nd digit Pressure Ulcer POA: No Measurement:superficial tissue loss on toes indicated above Wound ZOX:WRUEA, pink, serosanguinous exudate Drainage (amount, consistency, odor) see above Periwound: intact, ecchymotic.  May have a fracture of the second and/or third digit (Patient's son-in-law is a podiatrist on [facility], Dr. Elvin So and he had previously taped the 2nd and 3rd digits of the right foot as splinting). Dressing procedure/placement/frequency:I will provide conservative orders for wound care to be performed once daily. I will also provide Prevalon Boots for elevation. I will not follow.  Please re-consult if needed. Thanks, Ladona Mow, MSN, RN, [facility], CWOCN [phone])

## 2012-04-22 NOTE — Progress Notes (Signed)
  Echocardiogram 2D Echocardiogram has been performed.  Caleb Nash 04/22/2012, 3:10 PM

## 2012-04-22 NOTE — Progress Notes (Signed)
Utilization review completed.  

## 2012-04-22 NOTE — ED Provider Notes (Signed)
  I performed a history and physical examination of Caleb Nash and discussed his management with Dr. March Rummage.  I agree with the history, physical, assessment, and plan of care, with the following exceptions: None  On my exam this patient had a fall yesterday, was calm appearing, tonight focal complaints.  Given the duration of downtime, there suspicion of ongoing left foot abnormalities, as well as consideration of the possible etiologies of his fall.     Soon after the patient's arrival I discussed interrogation of his pacemaker with the manufacturer's representative.  Per his report, there has been no unusual activity recently, and the pacemaker separately sensing a trip, pacing the ventricles.    Date: 04/22/2012  Rate: 68  Rhythm: v-paced  QRS Axis: right  Intervals: QT prolonged  ST/T Wave abnormalities: nonspecific ST/T changes  Conduction Disutrbances:v-paced  Narrative Interpretation:   Old EKG Reviewed: not remarkably different ABNORMAL    The patient had multiple laboratory values, including elevations BNP, CK.  He also had elevated troponin, concerning for NSTEMI (Cardiology was consulted), creating a complex treatment algorithm.  The patient required resuscitationdue to his rhabdomyolysis , but with his elevated BNP, this required judicious provision.   he required admission for further evaluation and management.  Duard Spiewak   CRITICAL CARE Performed by: Gerhard Munch   Total critical care time: 35  Critical care time was exclusive of separately billable procedures and treating other patients.  Critical care was necessary to treat or prevent imminent or life-threatening deterioration.  Critical care was time spent personally by me on the following activities: development of treatment plan with patient and/or surrogate as well as nursing, discussions with consultants, evaluation of patient's response to treatment, examination of patient, obtaining history  from patient or surrogate, ordering and performing treatments and interventions, ordering and review of laboratory studies, ordering and review of radiographic studies, pulse oximetry and re-evaluation of patient's condition.    Gerhard Munch, MD 04/23/12 Marlyne Beards

## 2012-04-22 NOTE — Progress Notes (Signed)
Entered in error

## 2012-04-22 NOTE — Progress Notes (Signed)
Covering Clinical Child psychotherapist (CSW) received a call from Jodie Echevaria at [facility] that pt is actually an Social research officer, government of the facility. CSW informed [facility] that pt will most likely need a higher level of care at discharge. [facility] stated they expect to have a bed available for pt if recommendations are for SNF. CSW to continue following for placement.  Theresia Bough, MSW, Theresia Majors [phone]

## 2012-04-22 NOTE — Progress Notes (Signed)
Patient Name: Caleb Nash [facility] Date of Encounter: 04/22/2012    SUBJECTIVE: He is more spontaneously responsive.  TELEMETRY:  AV sequential pacing with intermittent episodes of atrial tachycardia, possibly atrial flutter or fibrillation. Filed Vitals:   04/21/12 2100 04/21/12 2149 04/22/12 0500 04/22/12 0503  BP: 139/76  164/62 161/74  Pulse: 78  76 80  Temp: 100.3 F (37.9 C) 97.7 F (36.5 C) 99.6 F (37.6 C)   TempSrc:  Oral    Resp: 16  18   Height: 5\' 8"  (1.727 m)     Weight: 76.885 kg (169 lb 8 oz)     SpO2: 98%  97%     Intake/Output Summary (Last 24 hours) at 04/22/12 0827 Last data filed at 04/22/12 0500  Gross per 24 hour  Intake 1706.67 ml  Output    877 ml  Net 829.67 ml    LABS: Basic Metabolic Panel:  Basename 04/22/12 0004 04/21/12 1550  NA -- 135  K -- 4.7  CL -- 93*  CO2 -- 14*  GLUCOSE -- 154*  BUN -- 42*  CREATININE -- 1.14  CALCIUM -- 9.9  MG 1.9 --  PHOS 4.3 --   CBC:  Basename 04/22/12 0705 04/21/12 1550  WBC 16.0* 17.3*  NEUTROABS -- 15.1*  HGB 12.6* 13.4  HCT 37.2* 39.9  MCV 97.4 98.3  PLT 255 239   Cardiac Enzymes:  Basename 04/21/12 2349 04/21/12 1857 04/21/12 1550  CKTOTAL -- 5685* 6148*  CKMB -- 70.7* --  CKMBINDEX -- -- --  TROPONINI 0.32* -- --   BNP: No components found with this basename: POCBNP:3 Hemoglobin A1C: No results found for this basename: HGBA1C in the last 72 hours Fasting Lipid Panel:  Basename 04/22/12 0004  CHOL 149  HDL 35*  LDLCALC 96  TRIG 89  CHOLHDL 4.3  LDLDIRECT --    Radiology/Studies:  No new data  Physical Exam: Blood pressure 161/74, pulse 80, temperature 99.6 F (37.6 C), temperature source Oral, resp. rate 18, height 5\' 8"  (1.727 m), weight 76.885 kg (169 lb 8 oz), SpO2 97.00%. Weight change:    Pericardial rub no longer present  ASSESSMENT:  1. Elevated troponin, secondary to stress in a patient with known coronary artery disease. This could potentially classify as a  non-ST elevation MI, type II  2. Paroxysmal atrial tachycardia, possible atrial fib, but not a good candidate for long-term oral anticoagulation   Plan:  1. No specific cardiac workup needed other than echocardiography.  2. Not a good long-term oral anticoagulation candidate due to age and frailty  Signed, Lesleigh Noe 04/22/2012, 8:27 AM

## 2012-04-22 NOTE — Progress Notes (Signed)
Unable to get patient to stand to do orthostatic bp's. Also noted patient telemetry showing V-tach during this time of standing and cleaning patient after bowel movement. Patient not symptomatic and vitals stable.  Caleb Nash

## 2012-04-22 NOTE — H&P (Signed)
Internal Medicine Teaching Service Attending Note Date: 04/22/2012  Patient name: Traylon Schimming [facility]  Medical record number: [id number]  Date of birth: 1914/01/10   I have seen and evaluated Tobe Sos and discussed their care with the Residency Team.    Mr. Kutzer is a pleasant 78yo man who presented to the hospital after a fall at his ALF and being found down by his daughter for an unknown amount of time, but less than 24 hours.  Mr. Doom cannot remember much surrounding his fall, however, he does note that was confused at the time.  He further notes that he had been sick with chest congestion and a cough along with fatigue and decreased appetite.  His last full meal was on Christmas day and he does not think he has eaten since that time.  He denies fever and chills; he was placed on robitussin by his PCP, however, this did not seem to help his symptoms much.  On the day of the fall, he took all of his medications as prescribed, including ambien which he has been on for many years.  He apparently got up and walked to the bathroom.  The next day, his daughter called him multiple times, and being concerned that he did not call her back, she entered his apartment and found him lying with his face on the bathroom floor with his feet in the hall and apparently trying to walk.  At that time, Mr. Jaber reports that he thought he was standing, leaning against a wall and not on the floor.  On admission to the ED, he had elevated CK and a UA consistent with rhabdomyolysis.  Further symptoms noted in the ED included bilateral toe pain, facial and lip swelling.  He denies any other symptoms.  He does have a history of constipation.   He does drink at least one drink a day, of Vodka.   Physical Exam: Blood pressure 148/65, pulse 69, temperature 98.2 F (36.8 C), temperature source Oral, resp. rate 17, height 5\' 8"  (1.727 m), weight 169 lb 8 oz (76.885 kg), SpO2 93.00%. BP 148/65  Pulse 69  Temp 98.2 F (36.8  C) (Oral)  Resp 17  Ht 5\' 8"  (1.727 m)  Wt 169 lb 8 oz (76.885 kg)  BMI 25.77 kg/m2  SpO2 93% Gen: Alert, cooperative, no distress, appears younger than stated age Head: Normocephalic, + facial swelling under left eye, bruise under right eye, swelling of lower lip Eyes: conjunctiva/corneas clear, EOM's intact Lungs: CTAB, no wheezing Heart: RR, NR, no rub noted, no murmur Abdomen: Soft, NT, ND, +BS Ext: Warm, perfused.  Bilateral feet with blisters, bruising anteriorly and in multiple toes, +abrasions Skin: No rash, dry Pulses: 2+ intact.       Lab results: Results for orders placed during the hospital encounter of 04/21/12 (from the past 24 hour(s))  TROPONIN I     Status: Abnormal   Collection Time   04/21/12 11:49 PM      Component Value Range   Troponin I 0.32 (*) <0.30 ng/mL  MAGNESIUM     Status: Normal   Collection Time   04/22/12 12:04 AM      Component Value Range   Magnesium 1.9  1.5 - 2.5 mg/dL  PHOSPHORUS     Status: Normal   Collection Time   04/22/12 12:04 AM      Component Value Range   Phosphorus 4.3  2.3 - 4.6 mg/dL  LIPID PANEL     Status:  Abnormal   Collection Time   04/22/12 12:04 AM      Component Value Range   Cholesterol 149  0 - 200 mg/dL   Triglycerides 89  <161 mg/dL   HDL 35 (*) >09 mg/dL   Total CHOL/HDL Ratio 4.3     VLDL 18  0 - 40 mg/dL   LDL Cholesterol 96  0 - 99 mg/dL  BASIC METABOLIC PANEL     Status: Abnormal   Collection Time   04/22/12  7:05 AM      Component Value Range   Sodium 138  135 - 145 mEq/L   Potassium 3.8  3.5 - 5.1 mEq/L   Chloride 100  96 - 112 mEq/L   CO2 15 (*) 19 - 32 mEq/L   Glucose, Bld 138 (*) 70 - 99 mg/dL   BUN 36 (*) 6 - 23 mg/dL   Creatinine, Ser 6.04  0.50 - 1.35 mg/dL   Calcium 9.1  8.4 - 54.0 mg/dL   GFR calc non Af Amer 69 (*) >90 mL/min   GFR calc Af Amer 80 (*) >90 mL/min  CBC     Status: Abnormal   Collection Time   04/22/12  7:05 AM      Component Value Range   WBC 16.0 (*) 4.0 - 10.5  K/uL   RBC 3.82 (*) 4.22 - 5.81 MIL/uL   Hemoglobin 12.6 (*) 13.0 - 17.0 g/dL   HCT 98.1 (*) 19.1 - 47.8 %   MCV 97.4  78.0 - 100.0 fL   MCH 33.0  26.0 - 34.0 pg   MCHC 33.9  30.0 - 36.0 g/dL   RDW 29.5  62.1 - 30.8 %   Platelets 255  150 - 400 K/uL  HEMOGLOBIN A1C     Status: Abnormal   Collection Time   04/22/12  7:05 AM      Component Value Range   Hemoglobin A1C 6.1 (*) <5.7 %   Mean Plasma Glucose 128 (*) <117 mg/dL  CK TOTAL AND CKMB     Status: Abnormal   Collection Time   04/22/12  7:05 AM      Component Value Range   Total CK 3402 (*) 7 - 232 U/L   CK, MB 46.3 (*) 0.3 - 4.0 ng/mL   Relative Index 1.4  0.0 - 2.5  TROPONIN I     Status: Abnormal   Collection Time   04/22/12  9:10 AM      Component Value Range   Troponin I 0.33 (*) <0.30 ng/mL    Imaging results:  Dg Chest 2 View  04/21/2012  *RADIOLOGY REPORT*  Clinical Data: Shortness of breath.  Syncope.  CHEST - 2 VIEW  Comparison: 04/21/2012  Findings: Improved aeration of both lungs is seen, with resolution of left retrocardiac atelectasis.  No evidence of pulmonary infiltrate or edema.  No evidence of pleural effusion.  Mild cardiomegaly stable.  Dual lead transvenous pacemaker remains in appropriate position.  Prior CABG again noted. Pulmonary hyperinflation is consistent with COPD.  IMPRESSION:  1.  Improved aeration with resolution of left retrocardiac atelectasis. 2.  COPD.  No acute findings.   Original Report Authenticated By: Myles Rosenthal, M.D.    Dg Elbow 2 Views Right  04/21/2012  *RADIOLOGY REPORT*  Clinical Data: Fall with right elbow bruising.  RIGHT ELBOW - 2 VIEW  Comparison: None.  Findings: No definite joint effusion or fracture.  Mild soft tissue swelling along the dorsal aspect of the elbow joint.  IMPRESSION: Mild soft tissue swelling without fracture.   Original Report Authenticated By: Leanna Battles, M.D.    Ct Head Wo Contrast  04/21/2012  *RADIOLOGY REPORT*  Clinical Data:  Fall.  CT HEAD  WITHOUT CONTRAST CT MAXILLOFACIAL WITHOUT CONTRAST  Technique:  Multidetector CT imaging of the head and maxillofacial structures were performed using the standard protocol without intravenous contrast. Multiplanar CT image reconstructions of the maxillofacial structures were also generated.  Comparison:  None  CT HEAD  Findings: There is atrophy and chronic small vessel disease changes. No acute intracranial abnormality.  Specifically, no hemorrhage, hydrocephalus, mass lesion, acute infarction, or significant intracranial injury.  No acute calvarial abnormality.  IMPRESSION: No acute intracranial abnormality.  Atrophy, chronic microvascular disease.  CT MAXILLOFACIAL  Findings:   No acute bony abnormality.  No evidence of facial or orbital fracture.  Paranasal sinuses are clear.  Orbital soft tissues unremarkable.  IMPRESSION: No evidence of facial/orbital fracture.   Original Report Authenticated By: Charlett Nose, M.D.    Dg Chest Portable 1 View  04/21/2012  *RADIOLOGY REPORT*  Clinical Data: Fall with bruising.  Loss of consciousness.  PORTABLE CHEST - 1 VIEW  Comparison: 11/06/2006.  Findings: Trachea is midline.  Heart size is accentuated by AP semi upright technique and low lung volumes.  Right subclavian pacemaker lead tips project over the right atrium and right ventricle. Biapical pleural parenchymal scarring.  Mild bibasilar air space disease, left greater than right.  No definite pleural fluid.  Post- traumatic or postsurgical changes involving the distal left clavicle, unchanged.  IMPRESSION: Low lung volumes with mild bibasilar air space disease, left greater than right, possibly due to atelectasis.  Aspiration and pneumonia are not excluded.   Original Report Authenticated By: Leanna Battles, M.D.    Dg Foot 2 Views Left  04/21/2012  *RADIOLOGY REPORT*  Clinical Data: Fall.  Lacerations and bruising of the toes.  LEFT FOOT - 2 VIEW  Comparison: None.  Findings: On this two-view exam, the toes  are overlapping, limiting evaluation for fractures.  There is atherosclerotic calcification of the arteries of the feet.  No obvious acute fracture identified. Note is made of a plantar calcaneal spur.  IMPRESSION:  1.  Although no obvious fracture identified, the toes are overlapping and phalangeal fractures are not excluded.  See above. 2.  Considered dedicated digit views as needed.   Original Report Authenticated By: Norva Pavlov, M.D.    Dg Foot 2 Views Right  04/21/2012  *RADIOLOGY REPORT*  Clinical Data: Fall.  Lacerations and bruising of the toes and distal foot.  RIGHT FOOT - 2 VIEW  Comparison: None.  Findings: Two views are performed, showing overlapping of the toes. This limits full evaluation of the toes for fracture.  However, there is a lytic lesion involving the distal phalanx of the second digit, associated with soft tissue density.  Findings raise a question of osteomyelitis or other lesions such as metastasis. There are atherosclerotic calcifications of the arteries of the feet.  IMPRESSION:  1.  No evidence for acute fracture. 2.  Suspicious lytic lesion involving the distal phalanx of the second digit.  See above.   Original Report Authenticated By: Norva Pavlov, M.D.    Ct Maxillofacial Wo Cm  04/21/2012  *RADIOLOGY REPORT*  Clinical Data:  Fall.  CT HEAD WITHOUT CONTRAST CT MAXILLOFACIAL WITHOUT CONTRAST  Technique:  Multidetector CT imaging of the head and maxillofacial structures were performed using the standard protocol without intravenous contrast. Multiplanar CT image reconstructions  of the maxillofacial structures were also generated.  Comparison:  None  CT HEAD  Findings: There is atrophy and chronic small vessel disease changes. No acute intracranial abnormality.  Specifically, no hemorrhage, hydrocephalus, mass lesion, acute infarction, or significant intracranial injury.  No acute calvarial abnormality.  IMPRESSION: No acute intracranial abnormality.  Atrophy, chronic  microvascular disease.  CT MAXILLOFACIAL  Findings:   No acute bony abnormality.  No evidence of facial or orbital fracture.  Paranasal sinuses are clear.  Orbital soft tissues unremarkable.  IMPRESSION: No evidence of facial/orbital fracture.   Original Report Authenticated By: Charlett Nose, M.D.     Assessment and Plan: I agree with the formulated Assessment and Plan with the following changes:   1. Rhabdomyolysis - CK elevated > 6000 - Agree with fluids and holding any renally secreted medications for now - Foley for accurate urine measurement, d/c as soon as patient stable to walk - Follow BMP every 12 hours  2. Syncope and fall - Unclear etiology but vasovagal or orthostatic hypotension more likely given recent illness and decreased PO intake - He had a friction rub on admission - TTE pending - IVF as above - Wound care for foot injury  3. Cough/chest congestion - Repeat CXR was not concerning for pneumonia.  - Continue conservative measures.   Inez Catalina, MD 12/30/20139:15 PM

## 2012-04-22 NOTE — Progress Notes (Signed)
Unable to collect sputum culture, patient not able to expectorate phlegm at this time. Caleb Nash

## 2012-04-22 NOTE — Progress Notes (Signed)
Subjective: No overnight events, he reports feeling a little anxious today but otherwise with no complaints.   He denies shortness of breath, headache, confusion, dysuria, or chest pain.   Objective: Vital signs in last 24 hours: Filed Vitals:   04/21/12 2100 04/21/12 2149 04/22/12 0500 04/22/12 0503  BP: 139/76  164/62 161/74  Pulse: 78  76 80  Temp: 100.3 F (37.9 C) 97.7 F (36.5 C) 99.6 F (37.6 C)   TempSrc:  Oral    Resp: 16  18   Height: 5\' 8"  (1.727 m)     Weight: 169 lb 8 oz (76.885 kg)     SpO2: 98%  97%    Weight change:   Intake/Output Summary (Last 24 hours) at 04/22/12 1125 Last data filed at 04/22/12 0945  Gross per 24 hour  Intake 1906.67 ml  Output    878 ml  Net 1028.67 ml  Vital signs reviewed General: No acute distress, alert and oriented, responds to questions, cooperative with exam  HEENT: PERRL, EOMI, moist mucous membranes. Right periorbital nonblanching erythema and mild edema, right maxillary non blanching erythema with greenish discoloration, lower lip edema, few abrasions on his chin  Cardiovascular: Regular rate and rhythm. No other murmurs noted, no friction rub noted.  Respiratory: Clear to auscultation anteriorly, no wheezes, rales, or rhonchi  Abdomen: Soft, nondistended, nontender, bowel sounds present  Extremities: Warm and well-perfused, pedal pulses intact. No compartment syndrome. No lower extremity edema. Both feet with nonblanching erythema and abrasion injuries, first 3 metatarsal taped bilaterally .  Skin: Warm, dry, no rashes  Neuro:  Alert and Oriented x3. Strength 4/5 throughout, symmetric. No facial droop. No asterixis, no tremors noted Psych: Not anxious appearing, no depressed mood, normal affect.  Lab Results: Basic Metabolic Panel:  Lab 04/22/12 4098 04/22/12 0004 04/21/12 1550  NA 138 -- 135  K 3.8 -- 4.7  CL 100 -- 93*  CO2 15* -- 14*  GLUCOSE 138* -- 154*  BUN 36* -- 42*  CREATININE 0.91 -- 1.14  CALCIUM 9.1 --  9.9  MG -- 1.9 --  PHOS -- 4.3 --   Liver Function Tests:  Lab 04/21/12 1550  AST 138*  ALT 41  ALKPHOS 93  BILITOT 0.7  PROT 7.5  ALBUMIN 3.4*   CBC:  Lab 04/22/12 0705 04/21/12 1550  WBC 16.0* 17.3*  NEUTROABS -- 15.1*  HGB 12.6* 13.4  HCT 37.2* 39.9  MCV 97.4 98.3  PLT 255 239   Cardiac Enzymes:  Lab 04/22/12 0910 04/22/12 0705 04/21/12 2349 04/21/12 1857 04/21/12 1550  CKTOTAL -- 3402* -- 5685* 6148*  CKMB -- 46.3* -- 70.7* --  CKMBINDEX -- -- -- -- --  TROPONINI 0.33* -- 0.32* -- --   BNP:  Lab 04/21/12 1551  PROBNP 18364.0*   Fasting Lipid Panel:  Lab 04/22/12 0004  CHOL 149  HDL 35*  LDLCALC 96  TRIG 89  CHOLHDL 4.3  LDLDIRECT --   Coagulation:  Lab 04/21/12 1550  LABPROT 16.1*  INR 1.32   AUrinalysis:  Lab 04/21/12 1759  COLORURINE YELLOW  LABSPEC 1.025  PHURINE 5.0  GLUCOSEU NEGATIVE  HGBUR LARGE*  BILIRUBINUR SMALL*  KETONESUR 40*  PROTEINUR 30*  UROBILINOGEN 0.2  NITRITE NEGATIVE  LEUKOCYTESUR NEGATIVE    Micro Results: No results found for this or any previous visit (from the past 240 hour(s)). Studies/Results: Dg Chest 2 View  04/21/2012  *RADIOLOGY REPORT*  Clinical Data: Shortness of breath.  Syncope.  CHEST - 2 VIEW  Comparison: 04/21/2012  Findings: Improved aeration of both lungs is seen, with resolution of left retrocardiac atelectasis.  No evidence of pulmonary infiltrate or edema.  No evidence of pleural effusion.  Mild cardiomegaly stable.  Dual lead transvenous pacemaker remains in appropriate position.  Prior CABG again noted. Pulmonary hyperinflation is consistent with COPD.  IMPRESSION:  1.  Improved aeration with resolution of left retrocardiac atelectasis. 2.  COPD.  No acute findings.   Original Report Authenticated By: Myles Rosenthal, M.D.    Dg Elbow 2 Views Right  04/21/2012  *RADIOLOGY REPORT*  Clinical Data: Fall with right elbow bruising.  RIGHT ELBOW - 2 VIEW  Comparison: None.  Findings: No definite joint  effusion or fracture.  Mild soft tissue swelling along the dorsal aspect of the elbow joint.  IMPRESSION: Mild soft tissue swelling without fracture.   Original Report Authenticated By: Leanna Battles, M.D.    Ct Head Wo Contrast  04/21/2012  *RADIOLOGY REPORT*  Clinical Data:  Fall.  CT HEAD WITHOUT CONTRAST CT MAXILLOFACIAL WITHOUT CONTRAST  Technique:  Multidetector CT imaging of the head and maxillofacial structures were performed using the standard protocol without intravenous contrast. Multiplanar CT image reconstructions of the maxillofacial structures were also generated.  Comparison:  None  CT HEAD  Findings: There is atrophy and chronic small vessel disease changes. No acute intracranial abnormality.  Specifically, no hemorrhage, hydrocephalus, mass lesion, acute infarction, or significant intracranial injury.  No acute calvarial abnormality.  IMPRESSION: No acute intracranial abnormality.  Atrophy, chronic microvascular disease.  CT MAXILLOFACIAL  Findings:   No acute bony abnormality.  No evidence of facial or orbital fracture.  Paranasal sinuses are clear.  Orbital soft tissues unremarkable.  IMPRESSION: No evidence of facial/orbital fracture.   Original Report Authenticated By: Charlett Nose, M.D.    Dg Chest Portable 1 View  04/21/2012  *RADIOLOGY REPORT*  Clinical Data: Fall with bruising.  Loss of consciousness.  PORTABLE CHEST - 1 VIEW  Comparison: 11/06/2006.  Findings: Trachea is midline.  Heart size is accentuated by AP semi upright technique and low lung volumes.  Right subclavian pacemaker lead tips project over the right atrium and right ventricle. Biapical pleural parenchymal scarring.  Mild bibasilar air space disease, left greater than right.  No definite pleural fluid.  Post- traumatic or postsurgical changes involving the distal left clavicle, unchanged.  IMPRESSION: Low lung volumes with mild bibasilar air space disease, left greater than right, possibly due to atelectasis.   Aspiration and pneumonia are not excluded.   Original Report Authenticated By: Leanna Battles, M.D.    Dg Foot 2 Views Left  04/21/2012  *RADIOLOGY REPORT*  Clinical Data: Fall.  Lacerations and bruising of the toes.  LEFT FOOT - 2 VIEW  Comparison: None.  Findings: On this two-view exam, the toes are overlapping, limiting evaluation for fractures.  There is atherosclerotic calcification of the arteries of the feet.  No obvious acute fracture identified. Note is made of a plantar calcaneal spur.  IMPRESSION:  1.  Although no obvious fracture identified, the toes are overlapping and phalangeal fractures are not excluded.  See above. 2.  Considered dedicated digit views as needed.   Original Report Authenticated By: Norva Pavlov, M.D.    Dg Foot 2 Views Right  04/21/2012  *RADIOLOGY REPORT*  Clinical Data: Fall.  Lacerations and bruising of the toes and distal foot.  RIGHT FOOT - 2 VIEW  Comparison: None.  Findings: Two views are performed, showing overlapping of the toes. This limits  full evaluation of the toes for fracture.  However, there is a lytic lesion involving the distal phalanx of the second digit, associated with soft tissue density.  Findings raise a question of osteomyelitis or other lesions such as metastasis. There are atherosclerotic calcifications of the arteries of the feet.  IMPRESSION:  1.  No evidence for acute fracture. 2.  Suspicious lytic lesion involving the distal phalanx of the second digit.  See above.   Original Report Authenticated By: Norva Pavlov, M.D.    Ct Maxillofacial Wo Cm  04/21/2012  *RADIOLOGY REPORT*  Clinical Data:  Fall.  CT HEAD WITHOUT CONTRAST CT MAXILLOFACIAL WITHOUT CONTRAST  Technique:  Multidetector CT imaging of the head and maxillofacial structures were performed using the standard protocol without intravenous contrast. Multiplanar CT image reconstructions of the maxillofacial structures were also generated.  Comparison:  None  CT HEAD  Findings:  There is atrophy and chronic small vessel disease changes. No acute intracranial abnormality.  Specifically, no hemorrhage, hydrocephalus, mass lesion, acute infarction, or significant intracranial injury.  No acute calvarial abnormality.  IMPRESSION: No acute intracranial abnormality.  Atrophy, chronic microvascular disease.  CT MAXILLOFACIAL  Findings:   No acute bony abnormality.  No evidence of facial or orbital fracture.  Paranasal sinuses are clear.  Orbital soft tissues unremarkable.  IMPRESSION: No evidence of facial/orbital fracture.   Original Report Authenticated By: Charlett Nose, M.D.    Medications: I have reviewed the patient's current medications. Scheduled Meds:    . amLODipine  10 mg Oral Daily  . aspirin EC  81 mg Oral Daily  . folic acid  1 mg Oral Daily  . loratadine  10 mg Oral Daily  . LORazepam  0-4 mg Intravenous Q6H   Followed by  . LORazepam  0-4 mg Intravenous Q12H  . multivitamin with minerals  1 tablet Oral Daily  . pantoprazole  40 mg Oral Daily  . sertraline  50 mg Oral Daily  . sodium chloride  3 mL Intravenous Q12H  . Tamsulosin HCl  0.4 mg Oral Daily  . thiamine  100 mg Oral Daily   Or  . thiamine  100 mg Intravenous Daily  . zolpidem  5 mg Oral QHS   Continuous Infusions:    . sodium chloride 100 mL/hr at 04/21/12 2156   PRN Meds:.LORazepam, LORazepam, nitroGLYCERIN, polyethylene glycol, traMADol Assessment/Plan: 1. Rhabdomyolysis: CK of 6148 on admission, trended down to 3402 today. UA with large Hbg but not RBC on admission. This is likely secondary to his fall at ALF and immobilization for 14 hours later. Creatinine  1.14.and  BUN 42. Creatinine trended down to 0.91 and BUN trended down to 36 on IVF. His K continues to be stable at 3.8. Normal Phosphorus at 4.3 and normal Mg at 1.9.  - Continue holding lisinopril and baclofenac due to the posssibility of worsening renal function.  - IVF NS @100  ml/h  -Follow BMP for renal function   2: CAD:  Patient is s/p CABG in 2005. Troponin elevated at 0.55 on admission but trended down to 0.33. CK-MB at 70.7 on admission, down to 46.3 today. Dr. Verdis Prime, Cardiologist, interrogated pacemaker which is noted to be well functioning. He recommends echo for eval of friction rub pericarditis though no rub noted on PE today. Per Dr. Katrinka Blazing, elevated troponin is suspected to be stress related. LDL of 96, continue statin. Repeat EKG with no ST changes. He has had runs of A.fib but per Cardiology, he is not a good  candidate for anticoagulation.  - Appreciate cardiology's consultation in managing our patient  - will continue cycle troponin and check CK-MB  - f/u Hemoglobin A1C.  3: Fall: Unwitnessed, unclear etiology. Possible causes are cardiac arrhythmia, alcohol intoxication, orthostatic hypotension (dehydration with recent URI and decrease intake per mouth) and mechanical fall (he walks with a walker and at cane at the [facility]). CT head without intracranial abnormalities. No fractures seen on facial CT. Unable to obtain orthostatic vital signs as pt could not stand up.  - 2D echo ordered - IV fluid, NS 100 ml/h  -consult to wound care for foot injury  -PT/OT consult  -Continue taping injured toes (B/L feet Xray with no definitve toe fractures)  4. Leukocytosis. WBC of 17.3 on admission with neutrophil predominance. CXR negative for PNA, UA negative for UTI, no fever noted. This could be secondary to fall/trauma/rhabdo. Leukocytosis trended down to 16.0 today.  -Continue monitoring -CBC in AM  5. Anion Gap Metabolic Acidosis. Most likely 2/2 Rhabdomyolysis but could also be 2/2 to lactic acidosis type B from prolonged fasting (urine ketones of 40), alcoholic ketoacidosis also a possibility. AG of 28 on admission down to 23 today.   -cont to monitor and treat rhabdo as above   6: Alcohol abuse: patient drinks almost every day. His CMP showed AST of 138 and ALT of 41 which is consistent with alcohol-induced  liver damage. Last drink reported to be 2 days ago.  -Continue  CIWA protocol, thiamine and folate  -consult social work    7: Cough: Patient has productive cough without fever or chills.  2 view CXR with no definite PNA. He remains afebrile, his leucocytosis has trended down.  -robitussin prn  -Mucinex -Sputum culture -Continue monitoring.   8: Pericarditis: Pericardial friction rub on admission but asymptomatic without chest pain. Friction rub no longer present at this time.  Per Cardiology, f/u on echo, no tx indicated at this time.  -2D echo ordered -Will consider to treat with steroid if severe chest pain (no NSAIDs in setting of rhabdomyolysis)   9: HTN: BP 160s/70s today. Patient has been on lisinopril 20 mg daily at home.  -Will hold lisinopril giving rhabdomyolysis and a possible worsening of renal function  -Continue Norvasc 10 mg daily.   DVT PPX: SCD   Dispo: Disposition is deferred at this time, awaiting improvement of current medical problems.  Anticipated discharge in approximately 2-3 day(s). He will likely need SNF placement: PT/OT consult and CSW consult.   The patient does  have a current PCP therefore will not be requiring OPC follow-up after discharge.   The patient does not have transportation limitations that hinder transportation to clinic appointments.  .Services Needed at time of discharge: Y = Yes, Blank = No PT:   OT:   RN:   Equipment:   Other:     LOS: 1 day   Caleb Nash D 04/22/2012, 11:25 AM

## 2012-04-22 NOTE — Clinical Social Work Psychosocial (Signed)
     Clinical Social Work Department BRIEF PSYCHOSOCIAL ASSESSMENT 04/22/2012  Patient:  Caleb Nash, Caleb Nash     Account Number:  [id number]     Admit date:  04/21/2012  Clinical Social Worker:  Lourdes Sledge  Date/Time:  04/22/2012 01:15 PM  Referred by:  Physician  Date Referred:  04/22/2012 Referred for  ALF Placement   Other Referral:   Pt admitted from [facility]   Interview type:  Family Other interview type:   CSW completed assessment with pt daughter Caleb Nash [phone].    PSYCHOSOCIAL DATA Living Status:  FACILITY Admitted from facility:  [facility] Level of care:  Assisted Living Primary support name:  Caleb Nash [phone]. Primary support relationship to patient:  CHILD, ADULT Degree of support available:   Pt was in and out of sleep during assessment.    CURRENT CONCERNS Current Concerns  Post-Acute Placement   Other Concerns:   Pt may need SNF placement at d/c.    SOCIAL WORK ASSESSMENT / PLAN CSW informed that pt was admitted from [facility].    CSW visited pt room and observed that pt was in and out of sleep and did not appear to be able to participate in assessment. CSW spoke with pt daughter Caleb Nash [phone] in pt room and explored pt living situation. Pt daughter confirmed that pt is a resident of [facility] [address] where he has lived since 2002. Pt daughter states she is pt HCPOA. CSW requested documentation and placed it in pt shadow chart. CSW confirmed that daughter would like pt to return when medically stable. CSW informed daughter that there is a possibility that PT may recommend a higher level of care for pt. Daughter states she is agreeable to SNF if recommended and would prefer for pt to stay at this same facility or sister company for SNF placement. CSW will follow up with facility once confirming recommendations from PT.   Assessment/plan status:  Psychosocial Support/Ongoing Assessment of  Needs Other assessment/ plan:   Information/referral to community resources:   CSW requested HCPOA documentation and CSW placed a copy in pt shadow chart. CSW to provide a SNF list if PT recommends SNF placement at discharge.    PATIENTS/FAMILYS RESPONSE TO PLAN OF CARE: Pt laying in bed, does not appear alert and capacity unable to confirm as pt was not participating in assessment. Daughter appreciative of CSW visit and prefers pt to remain at his current facility at either the same level or skilled.

## 2012-04-23 ENCOUNTER — Inpatient Hospital Stay (HOSPITAL_COMMUNITY): Payer: Medicare Other

## 2012-04-23 DIAGNOSIS — I471 Supraventricular tachycardia: Secondary | ICD-10-CM | POA: Diagnosis not present

## 2012-04-23 DIAGNOSIS — I2510 Atherosclerotic heart disease of native coronary artery without angina pectoris: Secondary | ICD-10-CM | POA: Diagnosis not present

## 2012-04-23 DIAGNOSIS — R7989 Other specified abnormal findings of blood chemistry: Secondary | ICD-10-CM | POA: Diagnosis not present

## 2012-04-23 DIAGNOSIS — I214 Non-ST elevation (NSTEMI) myocardial infarction: Secondary | ICD-10-CM | POA: Diagnosis not present

## 2012-04-23 DIAGNOSIS — I132 Hypertensive heart and chronic kidney disease with heart failure and with stage 5 chronic kidney disease, or end stage renal disease: Secondary | ICD-10-CM | POA: Diagnosis present

## 2012-04-23 DIAGNOSIS — I509 Heart failure, unspecified: Secondary | ICD-10-CM

## 2012-04-23 DIAGNOSIS — R0989 Other specified symptoms and signs involving the circulatory and respiratory systems: Secondary | ICD-10-CM | POA: Diagnosis not present

## 2012-04-23 DIAGNOSIS — I495 Sick sinus syndrome: Secondary | ICD-10-CM | POA: Diagnosis not present

## 2012-04-23 DIAGNOSIS — I319 Disease of pericardium, unspecified: Secondary | ICD-10-CM | POA: Diagnosis not present

## 2012-04-23 HISTORY — DX: Heart failure, unspecified: I50.9

## 2012-04-23 LAB — BASIC METABOLIC PANEL
BUN: 26 mg/dL — ABNORMAL HIGH (ref 6–23)
Chloride: 108 mEq/L (ref 96–112)
Creatinine, Ser: 0.78 mg/dL (ref 0.50–1.35)
GFR calc Af Amer: 86 mL/min — ABNORMAL LOW (ref >90)
GFR calc non Af Amer: 74 mL/min — ABNORMAL LOW (ref >90)
Glucose, Bld: 158 mg/dL — ABNORMAL HIGH (ref 70–99)

## 2012-04-23 LAB — CBC
HCT: 33.1 % — ABNORMAL LOW (ref 39.0–52.0)
Hemoglobin: 11.5 g/dL — ABNORMAL LOW (ref 13.0–17.0)
MCH: 33.8 pg (ref 26.0–34.0)
MCHC: 34.7 g/dL (ref 30.0–36.0)
RDW: 12.8 % (ref 11.5–15.5)

## 2012-04-23 LAB — CK TOTAL AND CKMB (NOT AT ARMC): CK, MB: 16.3 ng/mL (ref 0.3–4.0)

## 2012-04-23 MED ORDER — BOOST PLUS PO LIQD
237.0000 mL | Freq: Three times a day (TID) | ORAL | Status: DC
  Administered 2012-04-23 – 2012-04-25 (×4): 237 mL via ORAL
  Filled 2012-04-23 (×10): qty 237

## 2012-04-23 MED ORDER — ACETAMINOPHEN 500 MG PO TABS
500.0000 mg | ORAL_TABLET | Freq: Four times a day (QID) | ORAL | Status: DC | PRN
  Filled 2012-04-23: qty 1

## 2012-04-23 NOTE — Progress Notes (Signed)
Clinical Social Worker staffed case with MD.  CSW updated [facility] regarding PT recommendations.  CSW left voicemail message with pt's dtr.  CSW to continue to follow and assist as needed.   Angelia Mould, MSW, [place] [phone]

## 2012-04-23 NOTE — Evaluation (Signed)
Physical Therapy Evaluation Patient Details Name: Caleb Nash MRN: [id number] DOB: [birth year] Today's Date: 04/23/2012 Time: 1110-1140 PT Time Calculation (min): 30 min  PT Assessment / Plan / Recommendation Clinical Impression  pt s/p recent fall that resulted in rhabdomyolysis, right shoulder, ribs/ trunk pain, and bilat toe wounds.  He presents today with generalized weakness, decr activity tolerance, and poor mobility.  Pt plans to return to the [facility] at [facility] to complete his recovery.  Will follow pt in acute setting to maximize mobility and independence.      PT Assessment  Patient needs continued PT services    Follow Up Recommendations  SNF          Equipment Recommendations  None recommended by PT       Frequency Min 3X/week    Precautions / Restrictions Precautions Precautions: Fall Precaution Comments: pt reports only 1 additional fall in last year Restrictions Weight Bearing Restrictions: No   Pertinent Vitals/Pain BP: lying 138/65; sitting 124/73 - attempted to take orthostatic BP, however pt unable to stand for standing BP reading.  C/O pain on left flank, shoulder, and bilat toes.       Mobility  Bed Mobility Bed Mobility: Right Sidelying to Sit;Rolling Right;Rolling Left;Sitting - Scoot to Edge of Bed;Sit to Supine Rolling Right: 4: Min assist;With rail Rolling Left: 5: Set up;With rail Right Sidelying to Sit: 1: +2 Total assist;With rails;HOB elevated Right Sidelying to Sit: Patient Percentage: 40% Sitting - Scoot to Edge of Bed: 2: Max assist Sit to Supine: 1: +2 Total assist;HOB elevated Sit to Supine: Patient Percentage: 50% Details for Bed Mobility Assistance: req vc for set up, A for LE and trunk mobility; incr difficulty with R sidelying to sit Transfers Transfers: Sit to Stand;Stand to Sit Sit to Stand: 1: +2 Total assist;With upper extremity assist;From bed Sit to Stand: Patient Percentage: 30% Stand to Sit: 1: +2  Total assist;To bed Stand to Sit: Patient Percentage: 50% Details for Transfer Assistance: Pt with min participation in sit/stand; heavy lean to right side, forward trunk, and c/o inability to stand, requesting to sit.   Ambulation/Gait Ambulation/Gait Assistance: Not tested (comment) Stairs: No Wheelchair Mobility Wheelchair Mobility: No       Exercises General Exercises - Lower Extremity Quad Sets: AROM;Both;5 reps;Supine   PT Diagnosis: Difficulty walking;Acute pain;Generalized weakness  PT Problem List: Decreased strength;Decreased range of motion;Decreased activity tolerance;Decreased balance;Decreased mobility;Decreased coordination;Decreased cognition PT Treatment Interventions: DME instruction;Gait training;Functional mobility training;Therapeutic activities;Therapeutic exercise;Balance training   PT Goals Acute Rehab PT Goals PT Goal Formulation: With patient Time For Goal Achievement: 05/07/12 Potential to Achieve Goals: Good Pt will Roll Supine to Left Side: Independently PT Goal: Rolling Supine to Left Side - Progress: Goal set today Pt will go Sit to Stand: with min assist;with upper extremity assist PT Goal: Sit to Stand - Progress: Goal set today Pt will go Stand to Sit: with min assist;with upper extremity assist PT Goal: Stand to Sit - Progress: Goal set today Pt will Transfer Bed to Chair/Chair to Bed: with mod assist PT Transfer Goal: Bed to Chair/Chair to Bed - Progress: Goal set today Pt will Stand: with supervision;3 - 5 min;with bilateral upper extremity support PT Goal: Stand - Progress: Goal set today Pt will Ambulate: 16 - 50 feet;with min assist;with least restrictive assistive device PT Goal: Ambulate - Progress: Goal set today  Visit Information  Last PT Received On: 04/23/12 Assistance Needed: +2    Subjective Data  Subjective:  I'm feeling better today Patient Stated Goal: to get better   Prior Functioning  Home Living Lives With:  Alone Available Help at Discharge: Skilled Nursing Facility Type of Home: Independent living facility ([facility] ) Home Access: Level entry Home Layout: One level Bathroom Toilet: Handicapped height Bathroom Accessibility: Yes How Accessible: Accessible via walker Home Adaptive Equipment: Walker - rolling;Straight cane;Grab bars around toilet;Grab bars in shower Additional Comments: pt states he was at home by himself and was getting ready to sit but forgot to put his pendent back on and had to crawl across the floor Prior Function Level of Independence: Needs assistance Driving: No Comments: pt lives at [facility]; pt's daughter stated that this is not like him right now, that he is usually more mobile  Communication Communication: No difficulties Dominant Hand: Right    Cognition  Overall Cognitive Status: Impaired Area of Impairment: Memory Arousal/Alertness: Awake/alert Orientation Level: Oriented X4 / Intact Behavior During Session: WFL for tasks performed Memory Deficits: decr recall of fall     Extremity/Trunk Assessment Right Upper Extremity Assessment RUE ROM/Strength/Tone: Deficits RUE ROM/Strength/Tone Deficits: grossly decr strength; grip strength greater on R hand compared to L hand Left Upper Extremity Assessment LUE ROM/Strength/Tone: Deficits LUE ROM/Strength/Tone Deficits: grossly decr strength; decr grip strength Right Lower Extremity Assessment RLE ROM/Strength/Tone: Deficits RLE ROM/Strength/Tone Deficits: grossly 3/5; difficulty with standing, slow bed mobility RLE Sensation: WFL - Light Touch Left Lower Extremity Assessment LLE ROM/Strength/Tone: Deficits LLE ROM/Strength/Tone Deficits: grossly 3/5, slow mobility LLE Sensation: WFL - Light Touch Trunk Assessment Trunk Assessment: Normal   Balance Balance Balance Assessed: Yes Static Sitting Balance Static Sitting - Balance Support: Bilateral upper extremity supported;Feet  supported Static Sitting - Level of Assistance: 4: Min assist Static Sitting - Comment/# of Minutes: EOB with intermintent A to maintain upright posture  End of Session PT - End of Session Equipment Utilized During Treatment: Gait belt Activity Tolerance: Patient limited by fatigue Patient left: in bed;with call bell/phone within reach;with family/visitor present Nurse Communication: Mobility status;Need for lift equipment       Sharion Balloon 04/23/2012, 12:40 PM Sharion Balloon, SPT Acute Rehab Services [phone]

## 2012-04-23 NOTE — Progress Notes (Signed)
Patient Name: Caleb Nash [facility] Date of Encounter: 04/23/2012    SUBJECTIVE: The patient feels better. He has no cardiac complaints. The facial swelling has decreased. He is able have a coherent conversation.  TELEMETRY:  AV sequential pacing is noted. He has had bursts of supraventricular tachycardia per: Filed Vitals:   04/23/12 0400 04/23/12 0900 04/23/12 1116 04/23/12 1130  BP: 135/74 140/70 140/70 124/73  Pulse: 60 75    Temp: 98.2 F (36.8 C)     TempSrc: Oral     Resp: 16     Height:      Weight: 77.338 kg (170 lb 8 oz)     SpO2: 95%       Intake/Output Summary (Last 24 hours) at 04/23/12 1805 Last data filed at 04/23/12 1400  Gross per 24 hour  Intake   3200 ml  Output    400 ml  Net   2800 ml    LABS: Basic Metabolic Panel:  Basename 04/23/12 0557 04/22/12 0705 04/22/12 0004  NA 139 138 --  K 3.5 3.8 --  CL 108 100 --  CO2 19 15* --  GLUCOSE 158* 138* --  BUN 26* 36* --  CREATININE 0.78 0.91 --  CALCIUM 8.7 9.1 --  MG -- -- 1.9  PHOS -- -- 4.3   CBC:  Basename 04/23/12 0557 04/22/12 0705 04/21/12 1550  WBC 12.5* 16.0* --  NEUTROABS -- -- 15.1*  HGB 11.5* 12.6* --  HCT 33.1* 37.2* --  MCV 97.4 97.4 --  PLT 235 255 --   Cardiac Enzymes:  Basename 04/23/12 1406 04/22/12 0910 04/22/12 0705 04/21/12 2349 04/21/12 1857  CKTOTAL 870* -- 3402* -- 5685*  CKMB 16.3* -- 46.3* -- 70.7*  CKMBINDEX -- -- -- -- --  TROPONINI -- 0.33* -- 0.32* --   BNP    Component Value Date/Time   PROBNP 18364.0* 04/21/2012 1551   Hemoglobin A1C:  Basename 04/22/12 0705  HGBA1C 6.1*   Fasting Lipid Panel:  Basename 04/22/12 0004  CHOL 149  HDL 35*  LDLCALC 96  TRIG 89  CHOLHDL 4.3  LDLDIRECT --   ECHO: 04/22/2012 ------------------------------------------------------------ Study Conclusions  - Left ventricle: The cavity size was normal. There was mild concentric hypertrophy. Systolic function was mildly reduced. The estimated ejection fraction was in the  range of 45% to 50%. Cannot exclude hypokinesis of the distalanteroseptal myocardium. - Aortic valve: Mild regurgitation. - Mitral valve: Mild regurgitation. - Left atrium: The atrium was mildly dilated.    Radiology/Studies:  No new data  Physical Exam: Blood pressure 124/73, pulse 75, temperature 98.2 F (36.8 C), temperature source Oral, resp. rate 16, height 5\' 8"  (1.727 m), weight 77.338 kg (170 lb 8 oz), SpO2 95.00%. Weight change: 0.454 kg (1 lb)   No paracardial rub is heard. A 1/ 6 systolic murmur is heard at the right upper sternal border.  Abdomen soft.  Lungs clear to auscultation and percussion to  ASSESSMENT: 1. Probable non-ST elevation microinfarction, type II secondary to the stress of his clinical status situation on admission.  2. Paroxysmal atrial dysrhythmia, with normal DDD pacemaker function by interrogation and telemetry.  3. Improving renal function  Plan:  1. The patient is stable from a cardiac standpoint. No further workup or cardiac marker evaluation is needed.  2. Careful fluid administration to avoid congestive heart failure. I agree with converting IV fluids to [facility] as you have done.  3. Please call if we can be of further assistance.  Signed, Katrinka Blazing  Rosina Lowenstein 04/23/2012, 6:05 PM

## 2012-04-23 NOTE — Evaluation (Signed)
Occupational Therapy Evaluation Patient Details Name: Caleb Nash MRN: [id number] DOB: 05/10/1913 Today's Date: 04/23/2012 Time: 4098-1191 OT Time Calculation (min): 23 min  OT Assessment / Plan / Recommendation Clinical Impression  pt s/p recent fall that resulted in rhabdomyolysis, right shoulder, ribs/ trunk pain, and bilat toe wounds.  He presents today with generalized weakness, decr activity tolerance, and poor mobility.  Pt plans to return to the [facility] at [facility] to complete his recovery. Pt will benefit from skilled OT in the acute setting to maximize I with ADL and ADL mobility prior to d/c.     OT Assessment  Patient needs continued OT Services    Follow Up Recommendations  SNF    Barriers to Discharge      Equipment Recommendations  None recommended by OT    Recommendations for Other Services    Frequency  Min 2X/week    Precautions / Restrictions Precautions Precautions: Fall Precaution Comments: pt reports only 1 additional fall in last year Restrictions Weight Bearing Restrictions: No   Pertinent Vitals/Pain Pt reports right rib and shoulder pain but did not rate. Pt repositioned for pain relief    ADL  Grooming: Min guard;Supervision/safety;Wash/dry face Where Assessed - Grooming: Unsupported sitting Upper Body Bathing: Moderate assistance Where Assessed - Upper Body Bathing: Unsupported sitting Lower Body Bathing: +1 Total assistance Where Assessed - Lower Body Bathing: Rolling right and/or left Upper Body Dressing: Moderate assistance Where Assessed - Upper Body Dressing: Unsupported sitting Lower Body Dressing: +1 Total assistance (don socks; main limitation pain in right side) Where Assessed - Lower Body Dressing: Unsupported sitting Toileting - Clothing Manipulation and Hygiene: Moderate assistance Where Assessed - Toileting Clothing Manipulation and Hygiene: Rolling right and/or left Transfers/Ambulation Related to ADLs: unable  to perform  ADL Comments: Limited eval as pt fatigued.    OT Diagnosis: Generalized weakness;Acute pain;Cognitive deficits  OT Problem List: Decreased strength;Decreased activity tolerance;Impaired balance (sitting and/or standing);Decreased safety awareness;Decreased knowledge of use of DME or AE;Decreased knowledge of precautions;Decreased cognition;Pain;Impaired UE functional use OT Treatment Interventions: Self-care/ADL training;DME and/or AE instruction;Therapeutic activities;Patient/family education;Balance training   OT Goals Acute Rehab OT Goals OT Goal Formulation: With patient Time For Goal Achievement: 05/07/12 Potential to Achieve Goals: Good ADL Goals Pt Will Perform Grooming: with min assist;Standing at sink;Sitting at sink ADL Goal: Grooming - Progress: Goal set today Pt Will Perform Upper Body Dressing: with set-up;Sitting, bed;Sitting, chair ADL Goal: Upper Body Dressing - Progress: Goal set today Pt Will Perform Lower Body Dressing: with mod assist;Sit to stand from chair;Sit to stand from bed ADL Goal: Lower Body Dressing - Progress: Goal set today Pt Will Transfer to Toilet: Ambulation;with DME;with mod assist ADL Goal: Toilet Transfer - Progress: Goal set today Pt Will Perform Toileting - Clothing Manipulation: with min assist;Standing ADL Goal: Toileting - Clothing Manipulation - Progress: Goal set today Additional ADL Goal #1: Pt will be I with all aspects of bed mobility in prep for OOB functional activities. ADL Goal: Additional Goal #1 - Progress: Goal set today  Visit Information  Last OT Received On: 04/23/12 Assistance Needed: +2    Subjective Data  Subjective: "I didn't do so well getting up earlier" Patient Stated Goal: Return "home"   Prior Functioning     Home Living Lives With: Alone Available Help at Discharge: Skilled Nursing Facility Type of Home: Independent living facility ([facility] ) Home Access: Level entry Home Layout: One  level Bathroom Toilet: Handicapped height Bathroom Accessibility: Yes  How Accessible: Accessible via walker Home Adaptive Equipment: Walker - rolling;Straight cane;Grab bars around toilet;Grab bars in shower Additional Comments: pt states he was at home by himself and was getting ready to sit but forgot to put his pendent back on and had to crawl across the floor Prior Function Level of Independence: Needs assistance Driving: No Comments: pt lives at [facility]; pt's daughter stated that this is not like him right now, that he is usually more mobile  Communication Communication: No difficulties Dominant Hand: Right         Vision/Perception Vision - Assessment Additional Comments: No c/o. Assess next session if neededq   Cognition  Overall Cognitive Status: Impaired Area of Impairment: Memory Arousal/Alertness: Lethargic Orientation Level: Disoriented to;Place;Time Behavior During Session: WFL for tasks performed Memory Deficits: decr recall of fall     Extremity/Trunk Assessment Right Upper Extremity Assessment RUE ROM/Strength/Tone: Unable to fully assess;Due to pain RUE ROM/Strength/Tone Deficits: generally weak compounded by shoulder and rib pain Left Upper Extremity Assessment LUE ROM/Strength/Tone: Deficits LUE ROM/Strength/Tone Deficits: generally weak. at least 3+/5 but unable to fully assess due to pain Right Lower Extremity Assessment RLE ROM/Strength/Tone: Deficits RLE ROM/Strength/Tone Deficits: grossly 3/5; difficulty with standing, slow bed mobility RLE Sensation: WFL - Light Touch Left Lower Extremity Assessment LLE ROM/Strength/Tone: Deficits LLE ROM/Strength/Tone Deficits: grossly 3/5, slow mobility LLE Sensation: WFL - Light Touch Trunk Assessment Trunk Assessment: Normal     Mobility Bed Mobility Bed Mobility: Rolling Left;Left Sidelying to Sit Rolling Right: 3: Mod assist;With rail Rolling Left: 3: Mod assist;With rail Right Sidelying to  Sit: 1: +2 Total assist;With rails;HOB elevated Right Sidelying to Sit: Patient Percentage: 40% Left Sidelying to Sit: 1: +2 Total assist;HOB elevated Left Sidelying to Sit: Patient Percentage: 30% Sitting - Scoot to Edge of Bed: 2: Max assist Sit to Supine: 1: +2 Total assist;HOB elevated Sit to Supine: Patient Percentage: 50% Details for Bed Mobility Assistance: req vc for set up, A for LE and trunk mobility Transfers Transfers: Not assessed Sit to Stand: 1: +2 Total assist;With upper extremity assist;From bed Sit to Stand: Patient Percentage: 30% Stand to Sit: 1: +2 Total assist;To bed Stand to Sit: Patient Percentage: 50% Details for Transfer Assistance: pt fatigued during this session and unable to achieve standing     Shoulder Instructions     Exercise General Exercises - Lower Extremity Quad Sets: AROM;Both;5 reps;Supine   Balance Balance Balance Assessed: Yes Static Sitting Balance Static Sitting - Balance Support: Bilateral upper extremity supported;Feet supported Static Sitting - Level of Assistance: 4: Min assist Static Sitting - Comment/# of Minutes: EOB with intermintent A to maintain upright posture   End of Session OT - End of Session Equipment Utilized During Treatment: Gait belt Activity Tolerance: Patient limited by fatigue;Patient limited by pain Patient left: in bed;with call bell/phone within reach;with family/visitor present Nurse Communication: Mobility status  GO     Kelleen Stolze 04/23/2012, 3:25 PM

## 2012-04-23 NOTE — Evaluation (Signed)
Have collaborated with this patient and the note below. 04/23/2012  Putney Bing, PT [phone] (pager)

## 2012-04-23 NOTE — Progress Notes (Signed)
Subjective: He reports increased sputum today with increased cough.  He denies shortness of breath, chest pain, or dysuria.   Objective: Vital signs in last 24 hours: Filed Vitals:   04/23/12 0400 04/23/12 0900 04/23/12 1116 04/23/12 1130  BP: 135/74 140/70 140/70 124/73  Pulse: 60 75    Temp: 98.2 F (36.8 C)     TempSrc: Oral     Resp: 16     Height:      Weight: 170 lb 8 oz (77.338 kg)     SpO2: 95%      Weight change: 1 lb (0.454 kg)  Intake/Output Summary (Last 24 hours) at 04/23/12 1343 Last data filed at 04/23/12 0600  Gross per 24 hour  Intake   2860 ml  Output    600 ml  Net   2260 ml   Vital signs reviewed  General: No acute distress, alert and oriented, responds to questions, cooperative with exam  HEENT: PERRL, EOMI, moist mucous membranes. Right periorbital nonblanching erythema and mild edema improved from yesterday. Right maxillary non blanching erythema with greenish to purplish discoloration, lower lip edema improved today, few abrasions on his chin  Cardiovascular: Regular rate and rhythm. No other murmurs noted, no friction rub noted.  Respiratory: Bibasilar lung crackles. No wheezes or rhonchi  Abdomen: Soft, nondistended, nontender, bowel sounds present  Extremities: Warm and well-perfused, pedal pulses intact. No compartment syndrome. No lower extremity edema. Both feet with nonblanching erythema and abrasion injuries, first 3 metatarsal taped and covered with bandage that c/d/i blaterally.   Skin: Warm, dry, no rashes  Neuro: Alert and Oriented x3. Strength 4/5 throughout, symmetric. No facial droop. No asterixis, coarse bilateral hand tremors.  Psych: Not anxious appearing, no depressed mood, normal affect.  Lab Results: Basic Metabolic Panel:  Lab 04/23/12 1610 04/22/12 0705 04/22/12 0004  NA 139 138 --  K 3.5 3.8 --  CL 108 100 --  CO2 19 15* --  GLUCOSE 158* 138* --  BUN 26* 36* --  CREATININE 0.78 0.91 --  CALCIUM 8.7 9.1 --  MG -- -- 1.9    PHOS -- -- 4.3   Liver Function Tests:  Lab 04/21/12 1550  AST 138*  ALT 41  ALKPHOS 93  BILITOT 0.7  PROT 7.5  ALBUMIN 3.4*   CBC:  Lab 04/23/12 0557 04/22/12 0705 04/21/12 1550  WBC 12.5* 16.0* --  NEUTROABS -- -- 15.1*  HGB 11.5* 12.6* --  HCT 33.1* 37.2* --  MCV 97.4 97.4 --  PLT 235 255 --   Cardiac Enzymes:  Lab 04/22/12 0910 04/22/12 0705 04/21/12 2349 04/21/12 1857 04/21/12 1550  CKTOTAL -- 3402* -- 5685* 6148*  CKMB -- 46.3* -- 70.7* --  CKMBINDEX -- -- -- -- --  TROPONINI 0.33* -- 0.32* -- --   BNP:  Lab 04/21/12 1551  PROBNP 18364.0*  Hemoglobin A1C:  Lab 04/22/12 0705  HGBA1C 6.1*   Fasting Lipid Panel:  Lab 04/22/12 0004  CHOL 149  HDL 35*  LDLCALC 96  TRIG 89  CHOLHDL 4.3  LDLDIRECT --   Coagulation:  Lab 04/21/12 1550  LABPROT 16.1*  INR 1.32    Urinalysis:  Lab 04/21/12 1759  COLORURINE YELLOW  LABSPEC 1.025  PHURINE 5.0  GLUCOSEU NEGATIVE  HGBUR LARGE*  BILIRUBINUR SMALL*  KETONESUR 40*  PROTEINUR 30*  UROBILINOGEN 0.2  NITRITE NEGATIVE  LEUKOCYTESUR NEGATIVE   Studies/Results: Dg Chest 2 View  04/21/2012  *RADIOLOGY REPORT*  Clinical Data: Shortness of breath.  Syncope.  CHEST - 2 VIEW  Comparison: 04/21/2012  Findings: Improved aeration of both lungs is seen, with resolution of left retrocardiac atelectasis.  No evidence of pulmonary infiltrate or edema.  No evidence of pleural effusion.  Mild cardiomegaly stable.  Dual lead transvenous pacemaker remains in appropriate position.  Prior CABG again noted. Pulmonary hyperinflation is consistent with COPD.  IMPRESSION:  1.  Improved aeration with resolution of left retrocardiac atelectasis. 2.  COPD.  No acute findings.   Original Report Authenticated By: Myles Rosenthal, M.D.    Dg Elbow 2 Views Right  04/21/2012  *RADIOLOGY REPORT*  Clinical Data: Fall with right elbow bruising.  RIGHT ELBOW - 2 VIEW  Comparison: None.  Findings: No definite joint effusion or fracture.  Mild  soft tissue swelling along the dorsal aspect of the elbow joint.  IMPRESSION: Mild soft tissue swelling without fracture.   Original Report Authenticated By: Leanna Battles, M.D.    Ct Head Wo Contrast  04/21/2012  *RADIOLOGY REPORT*  Clinical Data:  Fall.  CT HEAD WITHOUT CONTRAST CT MAXILLOFACIAL WITHOUT CONTRAST  Technique:  Multidetector CT imaging of the head and maxillofacial structures were performed using the standard protocol without intravenous contrast. Multiplanar CT image reconstructions of the maxillofacial structures were also generated.  Comparison:  None  CT HEAD  Findings: There is atrophy and chronic small vessel disease changes. No acute intracranial abnormality.  Specifically, no hemorrhage, hydrocephalus, mass lesion, acute infarction, or significant intracranial injury.  No acute calvarial abnormality.  IMPRESSION: No acute intracranial abnormality.  Atrophy, chronic microvascular disease.  CT MAXILLOFACIAL  Findings:   No acute bony abnormality.  No evidence of facial or orbital fracture.  Paranasal sinuses are clear.  Orbital soft tissues unremarkable.  IMPRESSION: No evidence of facial/orbital fracture.   Original Report Authenticated By: Charlett Nose, M.D.    Dg Chest Portable 1 View  04/21/2012  *RADIOLOGY REPORT*  Clinical Data: Fall with bruising.  Loss of consciousness.  PORTABLE CHEST - 1 VIEW  Comparison: 11/06/2006.  Findings: Trachea is midline.  Heart size is accentuated by AP semi upright technique and low lung volumes.  Right subclavian pacemaker lead tips project over the right atrium and right ventricle. Biapical pleural parenchymal scarring.  Mild bibasilar air space disease, left greater than right.  No definite pleural fluid.  Post- traumatic or postsurgical changes involving the distal left clavicle, unchanged.  IMPRESSION: Low lung volumes with mild bibasilar air space disease, left greater than right, possibly due to atelectasis.  Aspiration and pneumonia are not  excluded.   Original Report Authenticated By: Leanna Battles, M.D.    Dg Foot 2 Views Left  04/21/2012  *RADIOLOGY REPORT*  Clinical Data: Fall.  Lacerations and bruising of the toes.  LEFT FOOT - 2 VIEW  Comparison: None.  Findings: On this two-view exam, the toes are overlapping, limiting evaluation for fractures.  There is atherosclerotic calcification of the arteries of the feet.  No obvious acute fracture identified. Note is made of a plantar calcaneal spur.  IMPRESSION:  1.  Although no obvious fracture identified, the toes are overlapping and phalangeal fractures are not excluded.  See above. 2.  Considered dedicated digit views as needed.   Original Report Authenticated By: Norva Pavlov, M.D.    Dg Foot 2 Views Right  04/21/2012  *RADIOLOGY REPORT*  Clinical Data: Fall.  Lacerations and bruising of the toes and distal foot.  RIGHT FOOT - 2 VIEW  Comparison: None.  Findings: Two views are performed, showing overlapping  of the toes. This limits full evaluation of the toes for fracture.  However, there is a lytic lesion involving the distal phalanx of the second digit, associated with soft tissue density.  Findings raise a question of osteomyelitis or other lesions such as metastasis. There are atherosclerotic calcifications of the arteries of the feet.  IMPRESSION:  1.  No evidence for acute fracture. 2.  Suspicious lytic lesion involving the distal phalanx of the second digit.  See above.   Original Report Authenticated By: Norva Pavlov, M.D.    Ct Maxillofacial Wo Cm  04/21/2012  *RADIOLOGY REPORT*  Clinical Data:  Fall.  CT HEAD WITHOUT CONTRAST CT MAXILLOFACIAL WITHOUT CONTRAST  Technique:  Multidetector CT imaging of the head and maxillofacial structures were performed using the standard protocol without intravenous contrast. Multiplanar CT image reconstructions of the maxillofacial structures were also generated.  Comparison:  None  CT HEAD  Findings: There is atrophy and chronic small  vessel disease changes. No acute intracranial abnormality.  Specifically, no hemorrhage, hydrocephalus, mass lesion, acute infarction, or significant intracranial injury.  No acute calvarial abnormality.  IMPRESSION: No acute intracranial abnormality.  Atrophy, chronic microvascular disease.  CT MAXILLOFACIAL  Findings:   No acute bony abnormality.  No evidence of facial or orbital fracture.  Paranasal sinuses are clear.  Orbital soft tissues unremarkable.  IMPRESSION: No evidence of facial/orbital fracture.   Original Report Authenticated By: Charlett Nose, M.D.     2D Echo 04/22/12: Study Conclusions  - Left ventricle: The cavity size was normal. There was mild concentric hypertrophy. Systolic function was mildly reduced. The estimated ejection fraction was in the range of 45% to 50%. Cannot exclude hypokinesis of the distalanteroseptal myocardium. - Aortic valve: Mild regurgitation. - Mitral valve: Mild regurgitation. - Left atrium: The atrium was mildly dilated.   Medications: I have reviewed the patient's current medications. Scheduled Meds:    . amLODipine  10 mg Oral Daily  . aspirin EC  81 mg Oral Daily  . folic acid  1 mg Oral Daily  . guaiFENesin  600 mg Oral BID  . lactose free nutrition  237 mL Oral TID WC  . loratadine  10 mg Oral Daily  . LORazepam  0-4 mg Intravenous Q6H   Followed by  . LORazepam  0-4 mg Intravenous Q12H  . multivitamin with minerals  1 tablet Oral Daily  . pantoprazole  40 mg Oral Daily  . sertraline  50 mg Oral Daily  . sodium chloride  3 mL Intravenous Q12H  . Tamsulosin HCl  0.4 mg Oral Daily  . thiamine  100 mg Oral Daily   Or  . thiamine  100 mg Intravenous Daily  . zolpidem  5 mg Oral QHS   Continuous Infusions:  PRN Meds:.acetaminophen, LORazepam, LORazepam, nitroGLYCERIN, polyethylene glycol, traMADol Assessment/Plan: 1. Rhabdomyolysis: CK of 6148 on admission, trended down to 3402. UA with large Hbg but not RBC on admission. This is  likely secondary to his fall at ALF and immobilization for 14 hours later. Creatinine 1.14.and BUN 42 on admission with normal Phosphorus at 4.3 and normal Mg at 1.9. Creatinine trended down to 0.91 and BUN trended down to 36 after initial IVF. Creatinine 0.78 and BUN 26 today. His K continues to be stable at 3.5.  - Continue holding lisinopril and baclofenac due to the posssibility of worsening renal function.  - Discontinued IVF NS @100  ml/h given consistent oral rehydration and improved renal fxn - BMET in AM - f/u CK and  CKMB  2. Fall: Unwitnessed, unclear etiology. Possible causes are cardiac arrhythmia, alcohol intoxication, orthostatic hypotension (dehydration with recent URI and decrease intake per mouth) and mechanical fall (he walks with a walker and at cane at the [facility]). CT head without intracranial abnormalities. No fractures seen on facial CT. Unable to obtain orthostatic vital signs as pt could not stand up. 2D echo with EF of of 45-50% as seen in previous echo in 2005 and no signs of pericarditis.  -Discontinued IV fluid, NS 100 ml/h  -consult to wound care for foot injury with orders for daily wound dressing changes -PT/OT consult, recommend SNF -Continue taping injured toes (B/L feet Xray with no definitve toe fractures). Pt's granddaughter's husband is a podiatrist and originally tapped the pt's toes.   3. Cough. Unclear etiology at this time. He has increased cough productive of yellow sputum today. This could be explained by bronchitis . His CXR on admission showed no signs of atelectasis or PNA, he remains afebrile and his leukocytosis has trended down from 17.0->16->to 12.5 today.  -robitussin prn  -Mucinex  -f/u Sputum culture  -Repeat CXR 2 view   4. Leukocytosis. WBC of 17.3 on admission with neutrophil predominance. CXR negative for PNA, UA negative for UTI, no fever noted. This could be secondary to fall/trauma/rhabdo. Leukocytosis trended down to 16 then to 12.5.     -Continue monitoring  -CBC in AM   5: CHF and CAD: Patient is s/p CABG in 2005. Troponin elevated at 0.55 on admission but trended down to 0.33. CK-MB at 70.7 on admission, down to 46.3 the next day. Dr. Verdis Prime, Cardiologist, interrogated pacemaker which is noted to be well functioning. Per Dr. Katrinka Blazing, pericardial rub no longer present, elevated troponin is suspected to be stress related. LDL of 96, continue statin. Repeat EKG with no ST changes. He has had runs of A.fib but per Cardiology, he is not a good candidate for anticoagulation. His 2D echo shows no pericardial fluid and EF of 45-50% which is similar to 2D echo in 2005. Hgb A1C 6.1, prediabetes.  - Appreciate cardiology's consultation in managing our patient  - will continue cycle troponin and check CK-MB  - f/u Hemoglobin A1C.   6. Anion Gap Metabolic Acidosis. Most likely 2/2 Rhabdomyolysis but could also be 2/2 to lactic acidosis type B from prolonged fasting (urine ketones of 40), alcoholic ketoacidosis also a possibility. AG of 28 on admission down to 23-->12 today.   -cont to monitor and treat rhabdo as above   7: Alcohol abuse: patient drinks almost every day. His CMP showed AST of 138 and ALT of 41 which is consistent with alcohol-induced liver damage. Last drink reported to be 2 days prior to admission.  -Continue CIWA protocol, thiamine and folate  -consult to social work for polysubstance abuse counseling  8: Pericarditis: Pericardial friction rub on admission but asymptomatic without chest pain. Friction rub no longer present at this time. 2D echo with no pericardial fluid. Per Cardiology, no tx indicated at this time.  -Will consider to treat with steroid if severe chest pain (no NSAIDs in setting of rhabdomyolysis)   9: HTN: BP 140s/70s today. Patient has been on lisinopril 20 mg daily at home.  -Will hold lisinopril giving rhabdomyolysis and a possible worsening of renal function  -Continue Norvasc 10 mg daily.    DVT PPX: SCD   Dispo: Disposition is deferred at this time, awaiting improvement of current medical problems. Anticipated discharge in approximately 1 day(s). Per CSW,  his ALF has SNF bed available with anticipated discharge tomorrow.   The patient does have a current PCP therefore will not be requiring OPC follow-up after discharge.  The patient does not have transportation limitations that hinder transportation to clinic appointments.  .Services Needed at time of discharge: Y = Yes, Blank = No  PT:    OT:    RN:    Equipment:    Other:        LOS: 2 days   Sara Chu D 04/23/2012, 1:43 PM

## 2012-04-24 DIAGNOSIS — I319 Disease of pericardium, unspecified: Secondary | ICD-10-CM | POA: Diagnosis not present

## 2012-04-24 DIAGNOSIS — I214 Non-ST elevation (NSTEMI) myocardial infarction: Secondary | ICD-10-CM | POA: Diagnosis not present

## 2012-04-24 DIAGNOSIS — I495 Sick sinus syndrome: Secondary | ICD-10-CM | POA: Diagnosis not present

## 2012-04-24 DIAGNOSIS — R7989 Other specified abnormal findings of blood chemistry: Secondary | ICD-10-CM | POA: Diagnosis not present

## 2012-04-24 DIAGNOSIS — I2510 Atherosclerotic heart disease of native coronary artery without angina pectoris: Secondary | ICD-10-CM | POA: Diagnosis not present

## 2012-04-24 DIAGNOSIS — I509 Heart failure, unspecified: Secondary | ICD-10-CM | POA: Diagnosis not present

## 2012-04-24 DIAGNOSIS — I471 Supraventricular tachycardia: Secondary | ICD-10-CM | POA: Diagnosis not present

## 2012-04-24 LAB — URINALYSIS, ROUTINE W REFLEX MICROSCOPIC
Glucose, UA: NEGATIVE mg/dL
Nitrite: NEGATIVE
Specific Gravity, Urine: 1.022 (ref 1.005–1.030)
pH: 5 (ref 5.0–8.0)

## 2012-04-24 LAB — CBC
HCT: 38.3 % — ABNORMAL LOW (ref 39.0–52.0)
Hemoglobin: 13.1 g/dL (ref 13.0–17.0)
MCH: 33.7 pg (ref 26.0–34.0)
MCHC: 34.2 g/dL (ref 30.0–36.0)
MCV: 98.5 fL (ref 78.0–100.0)
RDW: 12.9 % (ref 11.5–15.5)

## 2012-04-24 LAB — BASIC METABOLIC PANEL
BUN: 28 mg/dL — ABNORMAL HIGH (ref 6–23)
Chloride: 110 mEq/L (ref 96–112)
Creatinine, Ser: 0.82 mg/dL (ref 0.50–1.35)
GFR calc Af Amer: 84 mL/min — ABNORMAL LOW (ref >90)
Glucose, Bld: 166 mg/dL — ABNORMAL HIGH (ref 70–99)
Potassium: 3.4 mEq/L — ABNORMAL LOW (ref 3.5–5.1)

## 2012-04-24 LAB — URINE MICROSCOPIC-ADD ON

## 2012-04-24 LAB — PRO B NATRIURETIC PEPTIDE: Pro B Natriuretic peptide (BNP): 7779 pg/mL — ABNORMAL HIGH (ref 0–450)

## 2012-04-24 LAB — STREP PNEUMONIAE URINARY ANTIGEN: Strep Pneumo Urinary Antigen: NEGATIVE

## 2012-04-24 MED ORDER — AZITHROMYCIN 250 MG PO TABS: 250.0000 mg | ORAL_TABLET | Freq: Every day | ORAL | Status: DC

## 2012-04-24 MED ORDER — DEXTROSE 5 % IV SOLN
1.0000 g | INTRAVENOUS | Status: DC
  Administered 2012-04-24 – 2012-04-25 (×2): 1 g via INTRAVENOUS
  Filled 2012-04-24 (×2): qty 10

## 2012-04-24 MED ORDER — BIMATOPROST 0.01 % OP SOLN
1.0000 [drp] | Freq: Every day | OPHTHALMIC | Status: DC
  Administered 2012-04-24: 1 [drp] via OPHTHALMIC
  Filled 2012-04-24: qty 2.5

## 2012-04-24 MED ORDER — BRIMONIDINE TARTRATE 0.2 % OP SOLN
1.0000 [drp] | Freq: Two times a day (BID) | OPHTHALMIC | Status: DC
  Administered 2012-04-24 – 2012-04-25 (×3): 1 [drp] via OPHTHALMIC
  Filled 2012-04-24: qty 5

## 2012-04-24 MED ORDER — CARVEDILOL 3.125 MG PO TABS
3.1250 mg | ORAL_TABLET | Freq: Two times a day (BID) | ORAL | Status: DC
  Administered 2012-04-24 – 2012-04-25 (×2): 3.125 mg via ORAL
  Filled 2012-04-24 (×4): qty 1

## 2012-04-24 MED ORDER — FUROSEMIDE 10 MG/ML IJ SOLN
40.0000 mg | Freq: Once | INTRAMUSCULAR | Status: AC
  Administered 2012-04-24: 40 mg via INTRAVENOUS
  Filled 2012-04-24: qty 4

## 2012-04-24 MED ORDER — POTASSIUM CHLORIDE 20 MEQ/15ML (10%) PO LIQD
20.0000 meq | Freq: Once | ORAL | Status: AC
  Administered 2012-04-24: 20 meq via ORAL
  Filled 2012-04-24: qty 15

## 2012-04-24 MED ORDER — TIMOLOL MALEATE 0.5 % OP SOLN
1.0000 [drp] | Freq: Two times a day (BID) | OPHTHALMIC | Status: DC
  Administered 2012-04-24 – 2012-04-25 (×3): 1 [drp] via OPHTHALMIC
  Filled 2012-04-24: qty 5

## 2012-04-24 MED ORDER — AZITHROMYCIN 500 MG PO TABS
500.0000 mg | ORAL_TABLET | ORAL | Status: DC
  Administered 2012-04-24 – 2012-04-25 (×2): 500 mg via ORAL
  Filled 2012-04-24 (×2): qty 1

## 2012-04-24 MED ORDER — AMOXICILLIN 500 MG PO CAPS
1000.0000 mg | ORAL_CAPSULE | Freq: Three times a day (TID) | ORAL | Status: DC
  Filled 2012-04-24 (×3): qty 2

## 2012-04-24 MED ORDER — AZITHROMYCIN 500 MG PO TABS
500.0000 mg | ORAL_TABLET | Freq: Every day | ORAL | Status: DC
  Filled 2012-04-24: qty 1

## 2012-04-24 NOTE — Progress Notes (Addendum)
The patient was sleeping this morning when I came by. He is in a AV sequential paced rhythm. He seems to be struggling to breathe even though he is asleep. O2 saturations have been in the 80s. An oxygen supplementation has increased.  CHEST - 2 VIEW  Comparison: 04/21/2012  Findings: The abnormal density at the left lung base posterior  medially has recurred consistent with recurrent pneumonia or  atelectasis.  Heart size and vascularity are within normal limits. Right lung is  clear. Dual lead pacer in place. No acute osseous abnormality.  IMPRESSION:  Recurrent left lower lobe infiltrate/atelectasis.  Original Report Authenticated By: Francene Boyers, M.D.  BNP    Component Value Date/Time   PROBNP 18364.0* 04/21/2012 1551     Re-check a BNP level and consider diuresis if appropriate. I am concerned about the presence of acute diastolic heart failure related to the stress of his intercurrent illness including fluid administration (5 L positive). The chest x-ray raises the question of pneumonia but this could be atypical heart failure.

## 2012-04-24 NOTE — Progress Notes (Signed)
Subjective: He reports that his cough is a little better today. He denies chest pain.   Objective: Vital signs in last 24 hours: Filed Vitals:   04/23/12 2042 04/23/12 2059 04/24/12 0502 04/24/12 0950  BP: 187/70 176/58 175/71 172/67  Pulse: 85 85 75 82  Temp: 98.6 F (37 C) 98.4 F (36.9 C) 98.4 F (36.9 C)   TempSrc: Oral Oral Oral   Resp:      Height:      Weight:   178 lb 12.7 oz (81.1 kg)   SpO2: 85% 96% 92%    Weight change: 8 lb 4.7 oz (3.762 kg)  Intake/Output Summary (Last 24 hours) at 04/24/12 1032 Last data filed at 04/24/12 0839  Gross per 24 hour  Intake   1843 ml  Output    600 ml  Net   1243 ml   Vital signs reviewed  General: resting in bed, in NAD  HEENT:Moist mucous membranes. Right periorbital nonblanching erythema and mild edema improved from yesterday, almost completely resolved. Right maxillary non-blanching erythema with greenish to purplish discoloration, lower lip edema improved today, few abrasions on his chin. Cardiovascular: Regular rate and rhythm. No murmurs noted, no friction rub noted.  Respiratory: Bibasilar lung crackles. No wheezes or rhonchi  Abdomen: Soft, nondistended, nontender, bowel sounds present  Extremities: Warm and well-perfused, pedal pulses intact. No compartment syndrome. No lower extremity edema. Both feet with nonblanching erythema and abrasion injuries, first 3 metatarsal taped and covered with bandage that c/d/i blaterally. Prevalon Boots in place b/l.  Skin: Warm, dry. Neuro: Alert but drowsy today. Strength 4/5 throughout, symmetric. No facial droop. No asterixis, coarse bilateral hand tremors no longer present.  Psych: Not anxious appearing, no depressed mood, normal affect.  Lab Results: Basic Metabolic Panel:  Lab 04/24/12 4696 04/23/12 0557 04/22/12 0004  NA 143 139 --  K 3.4* 3.5 --  CL 110 108 --  CO2 19 19 --  GLUCOSE 166* 158* --  BUN 28* 26* --  CREATININE 0.82 0.78 --  CALCIUM 8.9 8.7 --  MG -- -- 1.9    PHOS -- -- 4.3   Liver Function Tests:  Lab 04/21/12 1550  AST 138*  ALT 41  ALKPHOS 93  BILITOT 0.7  PROT 7.5  ALBUMIN 3.4*   CBC:  Lab 04/24/12 0645 04/23/12 0557 04/21/12 1550  WBC 18.1* 12.5* --  NEUTROABS -- -- 15.1*  HGB 13.1 11.5* --  HCT 38.3* 33.1* --  MCV 98.5 97.4 --  PLT 252 235 --   Cardiac Enzymes:  Lab 04/23/12 1406 04/22/12 0910 04/22/12 0705 04/21/12 2349 04/21/12 1857  CKTOTAL 870* -- 3402* -- 5685*  CKMB 16.3* -- 46.3* -- 70.7*  CKMBINDEX -- -- -- -- --  TROPONINI -- 0.33* -- 0.32* --   BNP:  Lab 04/21/12 1551  PROBNP 18364.0*   Hemoglobin A1C:  Lab 04/22/12 0705  HGBA1C 6.1*   Fasting Lipid Panel:  Lab 04/22/12 0004  CHOL 149  HDL 35*  LDLCALC 96  TRIG 89  CHOLHDL 4.3  LDLDIRECT --    Coagulation:  Lab 04/21/12 1550  LABPROT 16.1*  INR 1.32   Urinalysis:  Lab 04/21/12 1759  COLORURINE YELLOW  LABSPEC 1.025  PHURINE 5.0  GLUCOSEU NEGATIVE  HGBUR LARGE*  BILIRUBINUR SMALL*  KETONESUR 40*  PROTEINUR 30*  UROBILINOGEN 0.2  NITRITE NEGATIVE  LEUKOCYTESUR NEGATIVE   Studies/Results: Dg Chest 2 View  04/23/2012  *RADIOLOGY REPORT*  Clinical Data: Increased sputum production.  Bibasilar lung crackles.  CHEST - 2 VIEW  Comparison: 04/21/2012  Findings: The abnormal density at the left lung base posterior medially has recurred consistent with recurrent pneumonia or atelectasis.  Heart size and vascularity are within normal limits.  Right lung is clear.  Dual lead pacer in place.  No acute osseous abnormality.  IMPRESSION: Recurrent left lower lobe infiltrate/atelectasis.   Original Report Authenticated By: Francene Boyers, M.D.    Medications: I have reviewed the patient's current medications. Scheduled Meds:   . amLODipine  10 mg Oral Daily  . amoxicillin  1,000 mg Oral Q8H  . aspirin EC  81 mg Oral Daily  . azithromycin  500 mg Oral Daily   Followed by  . azithromycin  250 mg Oral Daily  . folic acid  1 mg Oral Daily   . guaiFENesin  600 mg Oral BID  . lactose free nutrition  237 mL Oral TID WC  . loratadine  10 mg Oral Daily  . LORazepam  0-4 mg Intravenous Q12H  . multivitamin with minerals  1 tablet Oral Daily  . pantoprazole  40 mg Oral Daily  . sertraline  50 mg Oral Daily  . sodium chloride  3 mL Intravenous Q12H  . Tamsulosin HCl  0.4 mg Oral Daily  . thiamine  100 mg Oral Daily   Or  . thiamine  100 mg Intravenous Daily  . zolpidem  5 mg Oral QHS   Continuous Infusions:  PRN Meds:.acetaminophen, LORazepam, LORazepam, nitroGLYCERIN, polyethylene glycol, traMADol Assessment/Plan: 1. Rhabdomyolysis: Likely secondary to his fall and 14 hour immobilization at ALF. CK of 6148 on admission, gradually trended down 3402->870. Initial UA with large Hbg but not RBC on admission. Creatinine 1.14.and BUN 42 on admission with normal Phosphorus at 4.3 and normal Mg at 1.9. Creatinine trended down to 0.91 and BUN trended down to 36 after initial IVF. Creatinine 0.82 and BUN 28 today. His K continues to be stable at 3.4.  - Continue holding lisinopril and baclofenac due to the posssibility of worsening renal function.  - Discontinued IVF NS @100  ml/h given consistent oral rehydration and improved renal fxn  - BMET in AM   2. Fall: Unwitnessed, unclear etiology. Possible causes are cardiac arrhythmia, alcohol intoxication, orthostatic hypotension (dehydration with recent URI and decrease intake per mouth) and mechanical fall (he walks with a walker and [facility] cane at the [facility]). CT head without intracranial abnormalities. No fractures seen on facial CT. Unable to obtain orthostatic vital signs as pt could not stand up. 2D echo with EF of of 45-50% as seen in previous echo in 2005 and no signs of pericarditis. PT/OT consult, recommended SNF.  Consult to wound care for foot injury with orders for daily wound dressing changes, Prevalon Boots in place. -Continue taping injured toes (B/L feet Xray with no definitve toe  fractures). Pt's granddaughter's husband is a podiatrist and originally tapped the pt's toes.   3. CAP/Cough. He had increased cough productive of yellow sputum yesterday with repeat CXR showing possible recurrent PNA v. Atelectasis. He remains afebrile but his leukocytosis trended up from 12.5 to 18.1 today. He has not been in SNF or hospitalized in the last 90 days making HCAP unlikely.  -Ceftriaxone and Azithromycin for CAP (will transition Rocephin to amoxicillin for continued PO therapy upon his discharge) -robitussin prn  -Mucinex  -f/u Sputum culture   4. Leukocytosis. WBC of 17.3 on admission with neutrophil predominance. CXR negative for PNA, UA negative for UTI, no fever noted. This was thought  to be secondary to fall/trauma/rhabdo. Leukocytosis trended down to 16 then to 12.5 but to 18.1 today concerning for CAP given CXR findings.  - Antibiotics per above.  -Continue monitoring  -CBC in AM   5: CHF and CAD: Patient is s/p CABG in 2005. Troponin elevated at 0.55 on admission but trended down to 0.33. CK-MB at 70.7 on admission, down to 46.3 the next day. Dr. Verdis Prime, Cardiologist, interrogated pacemaker which is noted to be well functioning. Per Dr. Katrinka Blazing, pericardial rub no longer present, elevated troponin is suspected to be stress related. LDL of 96, continue statin. Repeat EKG with no ST changes. He has had runs of A.fib but per Cardiology, he is not a good candidate for anticoagulation. His 2D echo shows no pericardial fluid and EF of 45-50% which is similar to 2D echo in 2005. Hgb A1C 6.1, prediabetes.  - Appreciate cardiology's consultation in managing our patient   6. Anion Gap Metabolic Acidosis. Most likely 2/2 Rhabdomyolysis but could also be 2/2 to lactic acidosis type B from prolonged fasting (urine ketones of 40), alcoholic ketoacidosis also a possibility. AG of 28 on admission down to 23-->12 but up to 14 today.  -cont to monitor and treat rhabdo as above   7: Alcohol  abuse: patient drinks almost every day. His CMP showed AST of 138 and ALT of 41 which is consistent with alcohol-induced liver damage. Last drink reported to be 2 days prior to admission. He showed some agitation with bilateral coarse tremors and has required Ativan 1mg  at least once during this hospitalization.  -Continue CIWA protocol, thiamine and folate  -consult to social work for polysubstance abuse counseling   8: Pericarditis: Pericardial friction rub on admission but asymptomatic without chest pain. Friction rub no longer present at this time. 2D echo with no pericardial fluid. Per Cardiology, no tx indicated at this time.  -Will consider to treat with steroid if severe chest pain (no NSAIDs in setting of rhabdomyolysis)   9: HTN: BP elevated today to 170s/60s. Patient has been on lisinopril 20 mg daily at home.   -Will hold lisinopril giving rhabdomyolysis and a possible worsening of renal function  -Continue Norvasc 10 mg daily.  -Coreg 3.125 mg  BID   DVT PPX: SCD   Dispo: Disposition is deferred at this time, awaiting improvement of current medical problems. Anticipated discharge in approximately 1 day(s). Per CSW, his ALF has SNF bed available with anticipated discharge tomorrow.  The patient does have a current PCP therefore will not be requiring OPC follow-up after discharge.  The patient does not have transportation limitations that hinder transportation to clinic appointments.  .Services Needed at time of discharge: Y = Yes, Blank = No  PT:    OT:    RN:    Equipment:    Other:        LOS: 3 days   Sara Chu D 04/24/2012, 10:32 AM

## 2012-04-24 NOTE — Progress Notes (Addendum)
Internal Medicine Teaching Service Attending Note Date: 04/24/2012  Patient name: Caleb Nash  Medical record number: [id number]  Date of birth: [birth year]    This patient has been seen and discussed with the house staff. Please see their note for complete details. I concur with their findings with the following additions/corrections:  Have read Dr. Michaelle Copas note.  BNP is decreased; diuresis with IV lasix ordered and received by patient.   Will check BNP again with AM labs.   Continue Abx for presumed CAP.   Quoc Tome 04/24/2012, 2:48 PM

## 2012-04-24 NOTE — Progress Notes (Signed)
MD on call notified around 2100 of pt's oxygen saturation dropping to the 80s & sustaining. Pt was started on 4L [place] to maintain an oxygen saturation over 92%. Will continue to monitor the pt. Sanda Linger

## 2012-04-24 NOTE — Progress Notes (Signed)
Internal Medicine Teaching Service Attending Note Date: 04/24/2012  Patient name: Caleb Nash  Medical record number: [id number]  Date of birth: 1914-04-11    This patient has been seen and discussed with the house staff. Please see their note for complete details. I concur with their findings:  MULLEN, EMILY 04/24/2012, 6:01 AM

## 2012-04-25 DIAGNOSIS — I319 Disease of pericardium, unspecified: Secondary | ICD-10-CM | POA: Diagnosis not present

## 2012-04-25 DIAGNOSIS — D7289 Other specified disorders of white blood cells: Secondary | ICD-10-CM | POA: Diagnosis not present

## 2012-04-25 DIAGNOSIS — R1312 Dysphagia, oropharyngeal phase: Secondary | ICD-10-CM | POA: Diagnosis not present

## 2012-04-25 DIAGNOSIS — R262 Difficulty in walking, not elsewhere classified: Secondary | ICD-10-CM | POA: Diagnosis not present

## 2012-04-25 DIAGNOSIS — I509 Heart failure, unspecified: Secondary | ICD-10-CM | POA: Diagnosis not present

## 2012-04-25 DIAGNOSIS — I471 Supraventricular tachycardia: Secondary | ICD-10-CM | POA: Diagnosis not present

## 2012-04-25 DIAGNOSIS — R29818 Other symptoms and signs involving the nervous system: Secondary | ICD-10-CM | POA: Diagnosis not present

## 2012-04-25 DIAGNOSIS — I10 Essential (primary) hypertension: Secondary | ICD-10-CM | POA: Diagnosis not present

## 2012-04-25 DIAGNOSIS — I4891 Unspecified atrial fibrillation: Secondary | ICD-10-CM | POA: Diagnosis not present

## 2012-04-25 DIAGNOSIS — R609 Edema, unspecified: Secondary | ICD-10-CM | POA: Diagnosis not present

## 2012-04-25 DIAGNOSIS — M6281 Muscle weakness (generalized): Secondary | ICD-10-CM | POA: Diagnosis not present

## 2012-04-25 DIAGNOSIS — R7989 Other specified abnormal findings of blood chemistry: Secondary | ICD-10-CM | POA: Diagnosis not present

## 2012-04-25 DIAGNOSIS — M6282 Rhabdomyolysis: Secondary | ICD-10-CM | POA: Diagnosis not present

## 2012-04-25 DIAGNOSIS — N400 Enlarged prostate without lower urinary tract symptoms: Secondary | ICD-10-CM | POA: Diagnosis not present

## 2012-04-25 DIAGNOSIS — R279 Unspecified lack of coordination: Secondary | ICD-10-CM | POA: Diagnosis not present

## 2012-04-25 DIAGNOSIS — Q245 Malformation of coronary vessels: Secondary | ICD-10-CM | POA: Diagnosis not present

## 2012-04-25 DIAGNOSIS — I2510 Atherosclerotic heart disease of native coronary artery without angina pectoris: Secondary | ICD-10-CM | POA: Diagnosis not present

## 2012-04-25 DIAGNOSIS — I495 Sick sinus syndrome: Secondary | ICD-10-CM | POA: Diagnosis not present

## 2012-04-25 DIAGNOSIS — I214 Non-ST elevation (NSTEMI) myocardial infarction: Secondary | ICD-10-CM | POA: Diagnosis not present

## 2012-04-25 DIAGNOSIS — Z950 Presence of cardiac pacemaker: Secondary | ICD-10-CM | POA: Diagnosis not present

## 2012-04-25 LAB — CBC
HCT: 38 % — ABNORMAL LOW (ref 39.0–52.0)
MCHC: 33.4 g/dL (ref 30.0–36.0)
Platelets: 265 10*3/uL (ref 150–400)
RDW: 13.1 % (ref 11.5–15.5)

## 2012-04-25 LAB — BASIC METABOLIC PANEL
BUN: 31 mg/dL — ABNORMAL HIGH (ref 6–23)
Calcium: 9.1 mg/dL (ref 8.4–10.5)
Creatinine, Ser: 1.01 mg/dL (ref 0.50–1.35)
GFR calc Af Amer: 70 mL/min — ABNORMAL LOW (ref >90)
GFR calc non Af Amer: 61 mL/min — ABNORMAL LOW (ref >90)

## 2012-04-25 LAB — PRO B NATRIURETIC PEPTIDE: Pro B Natriuretic peptide (BNP): 5857 pg/mL — ABNORMAL HIGH (ref 0–450)

## 2012-04-25 LAB — CK TOTAL AND CKMB (NOT AT ARMC): Total CK: 265 U/L — ABNORMAL HIGH (ref 7–232)

## 2012-04-25 MED ORDER — FUROSEMIDE 40 MG PO TABS: 40.0000 mg | ORAL_TABLET | Freq: Every day | ORAL | Status: DC

## 2012-04-25 NOTE — Progress Notes (Signed)
Clinical Social Worker staffed case with MD; pt ready for dc to [facility] today.  CSW updated SNF-who does have a bed available-and updated pt's dtr at bedside.  Dtr requesting ambulance transfer.  CSW prepared dc packet.  CSW to sign off at time of dc, please re consult if needed.   Angelia Mould, MSW [employer] [phone]

## 2012-04-25 NOTE — Progress Notes (Signed)
Subjective: He diuresed well overnight with ~1,120 urine output yesterday.   Objective: Vital signs in last 24 hours: Filed Vitals:   04/24/12 1322 04/24/12 1700 04/24/12 2100 04/25/12 0500  BP: 133/63 152/65 102/56 156/62  Pulse: 69 72 60 60  Temp: 97.6 F (36.4 C)  99.3 F (37.4 C) 98.1 F (36.7 C)  TempSrc: Oral     Resp: 18  18 18   Height:      Weight:    170 lb 1.6 oz (77.157 kg)  SpO2: 92%  94% 94%   Weight change: -8 lb 11.1 oz (-3.943 kg)  Intake/Output Summary (Last 24 hours) at 04/25/12 0802 Last data filed at 04/25/12 0600  Gross per 24 hour  Intake    480 ml  Output   1600 ml  Net  -1120 ml   Vital signs reviewed  General: resting in bed, in NAD  HEENT:Moist mucous membranes. Right periorbital nonblanching erythema and mild edema improved from yesterday, almost completely resolved. Right maxillary non-blanching erythema with greenish to purplish discoloration, lower lip edema improved today, few abrasions on his chin.  Cardiovascular: Regular rate and rhythm. No murmurs noted, no friction rub noted.  Respiratory: CTAB. No wheezes or rhonchi  Abdomen: Soft, nondistended, nontender, bowel sounds present  Extremities: Warm and well-perfused, pedal pulses intact. No compartment syndrome. No lower extremity edema. Both feet with nonblanching erythema and abrasion injuries, first 3 metatarsal taped and covered with bandage that c/d/i blaterally. Prevalon Boots in place b/l.  Skin: Warm, dry.  Neuro: Alert but drowsy today. Strength 4/5 throughout, symmetric. No facial droop. No asterixis, coarse bilateral hand tremors no longer present.  Psych: Not anxious appearing, no depressed mood, normal affect.  Lab Results: Basic Metabolic Panel:  Lab 04/25/12 4098 04/24/12 0645 04/22/12 0004  NA 146* 143 --  K 4.1 3.4* --  CL 109 110 --  CO2 24 19 --  GLUCOSE 143* 166* --  BUN 31* 28* --  CREATININE 1.01 0.82 --  CALCIUM 9.1 8.9 --  MG -- -- 1.9  PHOS -- -- 4.3    Liver Function Tests:  Lab 04/21/12 1550  AST 138*  ALT 41  ALKPHOS 93  BILITOT 0.7  PROT 7.5  ALBUMIN 3.4*   CBC:  Lab 04/25/12 0435 04/24/12 0645 04/21/12 1550  WBC 16.7* 18.1* --  NEUTROABS -- -- 15.1*  HGB 12.7* 13.1 --  HCT 38.0* 38.3* --  MCV 100.3* 98.5 --  PLT 265 252 --   Cardiac Enzymes:  Lab 04/23/12 1406 04/22/12 0910 04/22/12 0705 04/21/12 2349 04/21/12 1857  CKTOTAL 870* -- 3402* -- 5685*  CKMB 16.3* -- 46.3* -- 70.7*  CKMBINDEX -- -- -- -- --  TROPONINI -- 0.33* -- 0.32* --   BNP:  Lab 04/25/12 0435 04/24/12 0645 04/21/12 1551  PROBNP 5857.0* 7779.0* 18364.0*    Hemoglobin A1C:  Lab 04/22/12 0705  HGBA1C 6.1*   Fasting Lipid Panel:  Lab 04/22/12 0004  CHOL 149  HDL 35*  LDLCALC 96  TRIG 89  CHOLHDL 4.3  LDLDIRECT --   Coagulation:  Lab 04/21/12 1550  LABPROT 16.1*  INR 1.32   Urinalysis:  Lab 04/24/12 1122 04/21/12 1759  COLORURINE YELLOW YELLOW  LABSPEC 1.022 1.025  PHURINE 5.0 5.0  GLUCOSEU NEGATIVE NEGATIVE  HGBUR LARGE* LARGE*  BILIRUBINUR NEGATIVE SMALL*  KETONESUR NEGATIVE 40*  PROTEINUR NEGATIVE 30*  UROBILINOGEN 0.2 0.2  NITRITE NEGATIVE NEGATIVE  LEUKOCYTESUR TRACE* NEGATIVE   Studies/Results: Dg Chest 2 View  04/23/2012  *  RADIOLOGY REPORT*  Clinical Data: Increased sputum production.  Bibasilar lung crackles.  CHEST - 2 VIEW  Comparison: 04/21/2012  Findings: The abnormal density at the left lung base posterior medially has recurred consistent with recurrent pneumonia or atelectasis.  Heart size and vascularity are within normal limits.  Right lung is clear.  Dual lead pacer in place.  No acute osseous abnormality.  IMPRESSION: Recurrent left lower lobe infiltrate/atelectasis.   Original Report Authenticated By: Francene Boyers, M.D.    Medications: I have reviewed the patient's current medications. Scheduled Meds:   . amLODipine  10 mg Oral Daily  . aspirin EC  81 mg Oral Daily  . azithromycin  500 mg Oral  Q24H  . bimatoprost  1 drop Both Eyes QHS  . brimonidine  1 drop Both Eyes BID  . carvedilol  3.125 mg Oral BID WC  . cefTRIAXone (ROCEPHIN)  IV  1 g Intravenous Q24H  . folic acid  1 mg Oral Daily  . guaiFENesin  600 mg Oral BID  . lactose free nutrition  237 mL Oral TID WC  . loratadine  10 mg Oral Daily  . LORazepam  0-4 mg Intravenous Q12H  . multivitamin with minerals  1 tablet Oral Daily  . pantoprazole  40 mg Oral Daily  . sertraline  50 mg Oral Daily  . sodium chloride  3 mL Intravenous Q12H  . Tamsulosin HCl  0.4 mg Oral Daily  . thiamine  100 mg Oral Daily   Or  . thiamine  100 mg Intravenous Daily  . timolol  1 drop Both Eyes BID  . zolpidem  5 mg Oral QHS   Continuous Infusions:  PRN Meds:.acetaminophen, nitroGLYCERIN, polyethylene glycol, traMADol Assessment/Plan: 1. Rhabdomyolysis: Resolved now. This was likely secondary to his fall and 14 hour immobilization at ALF. CK of 6148 on admission, gradually trended down 3402->870. Initial UA with large Hbg but not RBC on admission. Creatinine 1.14.and BUN 42 on admission with normal Phosphorus at 4.3 and normal Mg at 1.9. Creatinine trended down to 0.91 and BUN trended down to 36 after initial IVF. Creatinine 0.82 and BUN 28 today. His K continues to be stable at 4.1.  - Continue holding lisinopril and baclofenac due to the posssibility of worsening renal function.  - Discontinued IVF NS @100  ml/h given consistent oral rehydration and improved renal fxn   2. Fall: Unwitnessed, unclear etiology. Possible causes are cardiac arrhythmia, alcohol intoxication, orthostatic hypotension (dehydration with recent URI and decrease intake per mouth) and mechanical fall (he walks with a walker and [facility] the [facility]). CT head without intracranial abnormalities. No fractures seen on facial CT. Unable to obtain orthostatic vital signs as pt could not stand up. 2D echo with EF of of 45-50% as seen in previous echo in 2005 and no signs of  pericarditis. PT/OT consult, recommended SNF. Consult to wound care for foot injury with orders for daily wound dressing changes, Prevalon Boots in place.  -Continue taping injured toes (B/L feet Xray with no definitve toe fractures). Pt's granddaughter's husband is a podiatrist and originally tapped the pt's toes.   3. CAP/Cough. He had increased cough productive of yellow sputum yesterday with repeat CXR showing possible recurrent PNA v. Atelectasis. He remains afebrile but his leukocytosis trended up from 12.5 to 18.1 yesterday and was started on Ceftriaxone and Azithromycin; his WBC is down to 16.7 today. He has not been in SNF or hospitalized in the last 90 days making HCAP unlikely.  -  Ceftriaxone and Azithromycin for CAP (will transition Rocephin to amoxicillin for continued PO therapy upon his discharge)  -robitussin prn  -Mucinex  -f/u Sputum culture   4. Leukocytosis. WBC of 17.3 on admission with neutrophil predominance. CXR negative for PNA, UA negative for UTI on admission, no fever noted. This was thought to be secondary to fall/trauma/rhabdo. Leukocytosis trended down to 16 then to 12.5 but to 18.1 yesterday concerning for CAP given CXR findings.but down to 16.7 after antibiotics initiated. Of note, repeat UA with trace leukocytosis and 3-6 WBC but he is asymptomatic.  - Antibiotics per above.  -Continue monitoring  -CBC in AM  -f/u urine culture  5: CHF and CAD: Patient is s/p CABG in 2005. Troponin elevated at 0.55 on admission but trended down to 0.33. CK-MB at 70.7 on admission, down to 46.3 the next day. Dr. Verdis Prime, Cardiologist, interrogated pacemaker which is noted to be well functioning. Per Dr. Katrinka Blazing, pericardial rub no longer present, elevated troponin is suspected to be stress related. LDL of 96, continue statin. Repeat EKG with no ST changes. He has had runs of A.fib but per Cardiology, he is not a good candidate for anticoagulation. His 2D echo shows no pericardial  fluid and EF of 45-50% which is similar to 2D echo in 2005. Hgb A1C 6.1, prediabetes. Lasix 40mg  IV once yesterday, Per Dr. Katrinka Blazing, concern for volume overload. Good diuresis overnight. He will be discharge with Lasix 20mg  PO daily.  - Appreciate cardiology's consultation in managing our patient   6. Anion Gap Metabolic Acidosis. Most likely 2/2 Rhabdomyolysis but could also be 2/2 to lactic acidosis type B from prolonged fasting (urine ketones of 40), alcoholic ketoacidosis also a possibility. AG of 28 on admission down to 23-->12->14 but 13 today.  -cont to monitor and treat rhabdo as above   7: Alcohol abuse: patient drinks almost every day. His CMP showed AST of 138 and ALT of 41 which is consistent with alcohol-induced liver damage. Last drink reported to be 2 days prior to admission. He showed some agitation with bilateral coarse tremors and has required Ativan 1mg  at least once during this hospitalization.  -Discontinued CIWA protocol -continue thiamine and folate  -consult to social work for polysubstance abuse counseling   8: Pericarditis: Pericardial friction rub on admission but asymptomatic without chest pain. Friction rub no longer present at this time. 2D echo with no pericardial fluid. Per Cardiology, no tx indicated at this time.  -Will consider to treat with steroid if severe chest pain (no NSAIDs in setting of rhabdomyolysis)   9: HTN: BP elevated today to 150s/60s. Patient has been on lisinopril 20 mg daily at home.  -Will hold lisinopril giving rhabdomyolysis and a possible worsening of renal function  -Continue Norvasc 10 mg daily.  -Coreg 3.125 mg BID   DVT PPX: SCD   Dispo: Disposition is deferred at this time, awaiting improvement of current medical problems. Anticipated discharge in approximately 1 day(s). Per CSW, his ALF has SNF bed available with anticipated discharge today.  The patient does have a current PCP at the ALF/SNF therefore will not be requiring OPC  follow-up after discharge.  The patient does not have transportation limitations that hinder transportation to clinic appointments.  .Services Needed at time of discharge: Y = Yes, Blank = No  PT:  None, but pt going to Rehab/SNF  OT:    RN:    Equipment:    Other:        LOS: 4  days   Ky Barban 04/25/2012, 8:02 AM

## 2012-04-25 NOTE — Progress Notes (Signed)
PT Cancellation Note  Patient Details Name: Caleb Nash MRN: [id number] DOB: 08/12/1913   Cancelled Treatment:    Reason Eval/Treat Not Completed: Fatigue/lethargy limiting ability to participate (Pt awaiting transport to SNF.  )   Alferd Apa 04/25/2012, 4:19 PM

## 2012-04-25 NOTE — Discharge Summary (Signed)
Internal Medicine Teaching [facility] Discharge Note  Name: Caleb Nash MRN: [id number] DOB: August 15, 1913 79 y.o.  Date of Admission: 04/21/2012  3:09 PM Date of Discharge: 04/25/2012 Attending Physician: Inez Catalina, MD  Discharge Diagnosis: Principal Problem:  *Rhabdomyolysis Active Problems:  Pericarditis  CAD (coronary artery disease)  Tachy-brady syndrome  Fall  Pacemaker  Depression  Paroxysmal atrial tachycardia  Macular degeneration  Labile hypertension  Degenerative joint disease  Elevated troponin  BPH (benign prostatic hyperplasia)  CHF (congestive heart failure)  NSTEMI (non-ST elevated myocardial infarction)   Discharge Medications:   Medication List     As of 04/25/2012  1:52 PM    STOP taking these medications         diclofenac sodium 1 % Gel   Commonly known as: VOLTAREN      ICAPS MV PO      TYLENOL ARTHRITIS PAIN PO      TAKE these medications         aspirin 81 MG tablet   Take 81 mg by mouth daily.      bimatoprost 0.01 % Soln   Commonly known as: LUMIGAN   Apply 1 drop to eye at bedtime.      COMBIGAN 0.2-0.5 % ophthalmic solution   Generic drug: brimonidine-timolol   Place 1 drop into both eyes every 12 (twelve) hours.      furosemide 40 MG tablet   Commonly known as: LASIX   Take 1 tablet (40 mg total) by mouth daily.      lisinopril 20 MG tablet   Commonly known as: PRINIVIL,ZESTRIL   Take 20 mg by mouth 2 (two) times daily.      loratadine 10 MG tablet   Commonly known as: CLARITIN   Take 10 mg by mouth daily.      nitroGLYCERIN 0.4 MG SL tablet   Commonly known as: NITROSTAT   Place 0.4 mg under the tongue every 5 (five) minutes as needed. For chest pain      omeprazole 20 MG capsule   Commonly known as: PRILOSEC   Take 20 mg by mouth daily.      polyethylene glycol packet   Commonly known as: MIRALAX / GLYCOLAX   Take 17 g by mouth daily as needed. For constipation      sertraline 50 MG tablet   Commonly known as: ZOLOFT   Take 50 mg by mouth daily.      Tamsulosin HCl 0.4 MG Caps   Commonly known as: FLOMAX   Take 0.4 mg by mouth daily.      traMADol 50 MG tablet   Commonly known as: ULTRAM   Take 100 mg by mouth every 4 (four) hours as needed. For pain      zolpidem 5 MG tablet   Commonly known as: AMBIEN   Take 5 mg by mouth at bedtime.          Disposition and follow-up:   Caleb Nash was discharged from [facility] in {Stablecondition.  At the hospital follow up visit please address: -Improvement of his cough and completion of his antibiotics (3 days of amoxicillin and azithromycin to end on January 5th).  -He will need daily dressing changes for his toes bilaterally. -He was started on Lasix 20mg  PO daily, please reassess his blood pressure and volume status; he may not need  this medication for long term.   Follow-up Appointments:   Discharge Orders    Future Orders Please  Complete By Expires   Diet - low sodium heart healthy      Increase activity slowly         Consultations: Treatment Team:  Lesleigh Noe, MD  Procedures Performed:  Dg Chest 2 View  04/23/2012  *RADIOLOGY REPORT*  Clinical Data: Increased sputum production.  Bibasilar lung crackles.  CHEST - 2 VIEW  Comparison: 04/21/2012  Findings: The abnormal density at the left lung base posterior medially has recurred consistent with recurrent pneumonia or atelectasis.  Heart size and vascularity are within normal limits.  Right lung is clear.  Dual lead pacer in place.  No acute osseous abnormality.  IMPRESSION: Recurrent left lower lobe infiltrate/atelectasis.   Original Report Authenticated By: Francene Boyers, M.D.    Dg Chest 2 View  04/21/2012  *RADIOLOGY REPORT*  Clinical Data: Shortness of breath.  Syncope.  CHEST - 2 VIEW  Comparison: 04/21/2012  Findings: Improved aeration of both lungs is seen, with resolution of left retrocardiac atelectasis.  No evidence of  pulmonary infiltrate or edema.  No evidence of pleural effusion.  Mild cardiomegaly stable.  Dual lead transvenous pacemaker remains in appropriate position.  Prior CABG again noted. Pulmonary hyperinflation is consistent with COPD.  IMPRESSION:  1.  Improved aeration with resolution of left retrocardiac atelectasis. 2.  COPD.  No acute findings.   Original Report Authenticated By: Myles Rosenthal, M.D.    Dg Elbow 2 Views Right  04/21/2012  *RADIOLOGY REPORT*  Clinical Data: Fall with right elbow bruising.  RIGHT ELBOW - 2 VIEW  Comparison: None.  Findings: No definite joint effusion or fracture.  Mild soft tissue swelling along the dorsal aspect of the elbow joint.  IMPRESSION: Mild soft tissue swelling without fracture.   Original Report Authenticated By: Leanna Battles, M.D.    Ct Head Wo Contrast  04/21/2012  *RADIOLOGY REPORT*  Clinical Data:  Fall.  CT HEAD WITHOUT CONTRAST CT MAXILLOFACIAL WITHOUT CONTRAST  Technique:  Multidetector CT imaging of the head and maxillofacial structures were performed using the standard protocol without intravenous contrast. Multiplanar CT image reconstructions of the maxillofacial structures were also generated.  Comparison:  None  CT HEAD  Findings: There is atrophy and chronic small vessel disease changes. No acute intracranial abnormality.  Specifically, no hemorrhage, hydrocephalus, mass lesion, acute infarction, or significant intracranial injury.  No acute calvarial abnormality.  IMPRESSION: No acute intracranial abnormality.  Atrophy, chronic microvascular disease.  CT MAXILLOFACIAL  Findings:   No acute bony abnormality.  No evidence of facial or orbital fracture.  Paranasal sinuses are clear.  Orbital soft tissues unremarkable.  IMPRESSION: No evidence of facial/orbital fracture.   Original Report Authenticated By: Charlett Nose, M.D.    Dg Chest Portable 1 View  04/21/2012  *RADIOLOGY REPORT*  Clinical Data: Fall with bruising.  Loss of consciousness.  PORTABLE  CHEST - 1 VIEW  Comparison: 11/06/2006.  Findings: Trachea is midline.  Heart size is accentuated by AP semi upright technique and low lung volumes.  Right subclavian pacemaker lead tips project over the right atrium and right ventricle. Biapical pleural parenchymal scarring.  Mild bibasilar air space disease, left greater than right.  No definite pleural fluid.  Post- traumatic or postsurgical changes involving the distal left clavicle, unchanged.  IMPRESSION: Low lung volumes with mild bibasilar air space disease, left greater than right, possibly due to atelectasis.  Aspiration and pneumonia are not excluded.   Original Report Authenticated By: Leanna Battles, M.D.    Dg Foot 2 Views  Left  04/21/2012  *RADIOLOGY REPORT*  Clinical Data: Fall.  Lacerations and bruising of the toes.  LEFT FOOT - 2 VIEW  Comparison: None.  Findings: On this two-view exam, the toes are overlapping, limiting evaluation for fractures.  There is atherosclerotic calcification of the arteries of the feet.  No obvious acute fracture identified. Note is made of a plantar calcaneal spur.  IMPRESSION:  1.  Although no obvious fracture identified, the toes are overlapping and phalangeal fractures are not excluded.  See above. 2.  Considered dedicated digit views as needed.   Original Report Authenticated By: Norva Pavlov, M.D.    Dg Foot 2 Views Right  04/21/2012  *RADIOLOGY REPORT*  Clinical Data: Fall.  Lacerations and bruising of the toes and distal foot.  RIGHT FOOT - 2 VIEW  Comparison: None.  Findings: Two views are performed, showing overlapping of the toes. This limits full evaluation of the toes for fracture.  However, there is a lytic lesion involving the distal phalanx of the second digit, associated with soft tissue density.  Findings raise a question of osteomyelitis or other lesions such as metastasis. There are atherosclerotic calcifications of the arteries of the feet.  IMPRESSION:  1.  No evidence for acute fracture.  2.  Suspicious lytic lesion involving the distal phalanx of the second digit.  See above.   Original Report Authenticated By: Norva Pavlov, M.D.    Ct Maxillofacial Wo Cm  04/21/2012  *RADIOLOGY REPORT*  Clinical Data:  Fall.  CT HEAD WITHOUT CONTRAST CT MAXILLOFACIAL WITHOUT CONTRAST  Technique:  Multidetector CT imaging of the head and maxillofacial structures were performed using the standard protocol without intravenous contrast. Multiplanar CT image reconstructions of the maxillofacial structures were also generated.  Comparison:  None  CT HEAD  Findings: There is atrophy and chronic small vessel disease changes. No acute intracranial abnormality.  Specifically, no hemorrhage, hydrocephalus, mass lesion, acute infarction, or significant intracranial injury.  No acute calvarial abnormality.  IMPRESSION: No acute intracranial abnormality.  Atrophy, chronic microvascular disease.  CT MAXILLOFACIAL  Findings:   No acute bony abnormality.  No evidence of facial or orbital fracture.  Paranasal sinuses are clear.  Orbital soft tissues unremarkable.  IMPRESSION: No evidence of facial/orbital fracture.   Original Report Authenticated By: Charlett Nose, M.D.     2D Echo:  04/22/12: Study Conclusions  - Left ventricle: The cavity size was normal. There was mild concentric hypertrophy. Systolic function was mildly reduced. The estimated ejection fraction was in the range of 45% to 50%. Cannot exclude hypokinesis of the distalanteroseptal myocardium. - Aortic valve: Mild regurgitation. - Mitral valve: Mild regurgitation. - Left atrium: The atrium was mildly dilated.  Admission HPI:  Mr. Boger is a 79 yo gentleman with a history of CAD s/p CABG, tachybrady syndrome s/p pacermaker, HTN, OA, and BPH who presents after a fall at his assisted living facility. He fell the day before admission and was found approximately 14 hours later. Daughter last spoke with him the evening before and found him at 2pm the  next day after he did not return her phone calls. The patient states that he does not remember anything before the fall, does not recall having palpitations, lightheadedness, chest pain, or respiratory distress. He states he was quite confused over night, though he realized he had fallen, he thought he was upright and tried to run to the next room to get to the phone. He was unable to move and stayed face down in the  bathroom most of the night. Currently, he endorses b/l toe pain with wounds on both feet, facial swelling involving the R periorbital region and lower lip. Denies chest pain, shortness of breath, palpitations, dizziness.  He has had an upper respiratory illness this week with cough productive with brown-white sputum, decreased energy level, and diminished appetite. He saw his PCP, Dr. Pete Glatter, on Thursday and has been taking Robitussin and Mucinex. He has been drinking only water since Dec 25th, does not remember any other intake.  ROS+ for constipation this week for which he takes miralax, also decreased PO intake and appetite  ROS negative for fever or chills, no nausea or vomiting, no diarrhea.  Social history significant for daily vodka use, states last drink was 2 days ago. No illicit drugs.   Hospital Course by problem list: 1. Rhabdomyolysis: Resolved now. This was likely secondary to his fall and 14 hour immobilization at ALF. CK of 6148 on admission, gradually trended down 3402->870->265. Initial UA with large Hbg but not RBC on admission. He was treated with IVF NS @100ml /hr. His creatinine 1.14.and BUN 42 on admission with normal Phosphorus at 4.3 and normal Mg at 1.9. Creatinine trended down to 0.91 and BUN trended down to 36 after initial IVF. Creatinine 1.01 and BUN 31 today. His K continues to be stable at 4.1. We held his lisinopril and baclofenac due to the posssibility of worsening renal function.   2. Fall: Unwitnessed, unclear etiology. Possible causes are cardiac  arrhythmia, alcohol intoxication, orthostatic hypotension (dehydration with recent URI and decrease intake per mouth) and mechanical fall (he walks with a walker and at cane at the [facility]). CT head without intracranial abnormalities. No fractures seen on facial CT. Unable to obtain orthostatic vital signs as pt could not stand up. 2D echo with EF of of 45-50% as seen in previous echo in 2005 and no signs of pericarditis. PT/OT consult, recommended SNF. Consult to wound care for foot injury with orders for daily wound dressing changes, Prevalon Boots in place. We continue taping injured toes (B/L feet Xray with no definitve toe fractures). Pt's granddaughter's husband is a podiatrist and originally tapped the pt's toes. He will need daily dressing changes for his toes at SNF.   3. CAP/Cough. He had cough for days prior to his admission but it worsened becoming more productive of yellow sputum on 12/31 with repeat CXR showing possible recurrent PNA v. Atelectasis. He remains afebrile but his leukocytosis trended up from 12.5 to 18.1 yesterday and he was started on Ceftriaxone and Azithromycin; his WBC is down to 16.7 today. He had not been in SNF or hospitalized in the last 90 days not meeting criteria for HCAP. He was also given  Robitussin PRN and Mucinex. He will be transitioned to amoxicillin and Azithromycin for CAP for continued PO therapy upon his discharge (end date of January 6th).    4. Leukocytosis. WBC of 17.3 on admission with neutrophil predominance. CXR negative for PNA, UA negative for UTI on admission, no fever noted. This was thought to be secondary to fall/trauma/rhabdo. Leukocytosis trended down to 16 then to 12.5 but to 18.1 yesterday concerning for CAP given CXR findings but down to 16.7 after antibiotics initiated. Of note, repeat UA with trace leukocytosis and 3-6 WBC but he is asymptomatic. Urine culture with NGTD. He will need continued monitoring upon his discharge.   5: CHF and CAD:  Patient is s/p CABG in 2005. Troponin elevated at 0.55 on admission but trended down  to 0.33. CK-MB at 70.7 on admission, down to 46.3 the next day. Dr. Verdis Prime, Cardiologist, interrogated pacemaker which is noted to be well functioning. Per Dr. Katrinka Blazing, pericardial rub no longer present, elevated troponin is suspected to be stress related. LDL of 96, continue statin. Repeat EKG with no ST changes. He has had runs of A.fib but per Cardiology, he is not a good candidate for anticoagulation. His 2D echo shows no pericardial fluid and EF of 45-50% which is similar to 2D echo in 2005. Hgb A1C 6.1, prediabetes. Lasix 40mg  IV once yesterday, Per Dr. Katrinka Blazing, concern for volume overload. He tolerated this medication well, although he has sulfa allergy listed. Good diuresis overnight. He will be discharge with Lasix 20mg  PO daily.   6. Anion Gap Metabolic Acidosis. Most likely 2/2 Rhabdomyolysis but could also be 2/2 to lactic acidosis type B from prolonged fasting (urine ketones of 40), alcoholic ketoacidosis also a possibility. AG of 28 on admission down to 23-->12->14 but 13 today.   7: Alcohol abuse: He reports drinking Vodka almost every day. His CMP showed AST of 138 and ALT of 41 which is consistent with alcohol-induced liver damage. Last drink reported to be 2 days prior to admission. He showed some agitation with bilateral coarse tremors and has required Ativan 1mg  at least once during this hospitalization. We discontinued CIWA protocol after 48 hours but continued thiamine and folate.    8: Pericarditis: Pericardial friction rub on admission but asymptomatic without chest pain. Friction rub no longer present at this time. 2D echo with no pericardial fluid. Per Cardiology, no treatment indicated at this time.   9: HTN: BP elevated today to 150s/60s. Patient has been on lisinopril 20 mg daily at home. We held lisinopril giving rhabdomyolysis and a possible worsening of renal function. We continued Norvasc 10  mg daily and started Coreg 3.125 mg BID. Upon his discharge we will discontinue Norvasc and Coreg. We will resume his lisinopril and start Lasix daily upon his discharge.   Discharge Vitals:  BP 137/65  Pulse 67  Temp 98.1 F (36.7 C) (Oral)  Resp 18  Ht 5\' 8"  (1.727 m)  Wt 170 lb 1.6 oz (77.157 kg)  BMI 25.86 kg/m2  SpO2 94%  Discharge Labs:  Results for orders placed during the hospital encounter of 04/21/12 (from the past 24 hour(s))  BASIC METABOLIC PANEL     Status: Abnormal   Collection Time   04/25/12  4:35 AM      Component Value Range   Sodium 146 (*) 135 - 145 mEq/L   Potassium 4.1  3.5 - 5.1 mEq/L   Chloride 109  96 - 112 mEq/L   CO2 24  19 - 32 mEq/L   Glucose, Bld 143 (*) 70 - 99 mg/dL   BUN 31 (*) 6 - 23 mg/dL   Creatinine, Ser 6.57  0.50 - 1.35 mg/dL   Calcium 9.1  8.4 - 84.6 mg/dL   GFR calc non Af Amer 61 (*) >90 mL/min   GFR calc Af Amer 70 (*) >90 mL/min  CBC     Status: Abnormal   Collection Time   04/25/12  4:35 AM      Component Value Range   WBC 16.7 (*) 4.0 - 10.5 K/uL   RBC 3.79 (*) 4.22 - 5.81 MIL/uL   Hemoglobin 12.7 (*) 13.0 - 17.0 g/dL   HCT 96.2 (*) 95.2 - 84.1 %   MCV 100.3 (*) 78.0 - 100.0 fL  MCH 33.5  26.0 - 34.0 pg   MCHC 33.4  30.0 - 36.0 g/dL   RDW 16.1  09.6 - 04.5 %   Platelets 265  150 - 400 K/uL  PRO B NATRIURETIC PEPTIDE     Status: Abnormal   Collection Time   04/25/12  4:35 AM      Component Value Range   Pro B Natriuretic peptide (BNP) 5857.0 (*) 0 - 450 pg/mL  CK TOTAL AND CKMB     Status: Abnormal   Collection Time   04/25/12 10:25 AM      Component Value Range   Total CK 265 (*) 7 - 232 U/L   CK, MB 5.1 (*) 0.3 - 4.0 ng/mL   Relative Index 1.9  0.0 - 2.5    Signed: Ky Barban 04/25/2012, 1:52 PM   Time Spent on Discharge: 35 minutes Services Ordered on Discharge: None Equipment Ordered on Discharge: None

## 2012-04-25 NOTE — Progress Notes (Signed)
Patient Name: Caleb Nash [facility] Date of Encounter: 04/25/2012    SUBJECTIVE: The patient's breathing is improved.  TELEMETRY:  Rare episodes of paroxysmal supraventricular tachycardia: Filed Vitals:   04/24/12 2100 04/25/12 0500 04/25/12 0842 04/25/12 1114  BP: 102/56 156/62 166/69 137/65  Pulse: 60 60 67   Temp: 99.3 F (37.4 C) 98.1 F (36.7 C)    TempSrc:      Resp: 18 18    Height:      Weight:  77.157 kg (170 lb 1.6 oz)    SpO2: 94% 94%      Intake/Output Summary (Last 24 hours) at 04/25/12 1144 Last data filed at 04/25/12 0600  Gross per 24 hour  Intake    240 ml  Output   1600 ml  Net  -1360 ml    LABS: Basic Metabolic Panel:  Basename 04/25/12 0435 04/24/12 0645  NA 146* 143  K 4.1 3.4*  CL 109 110  CO2 24 19  GLUCOSE 143* 166*  BUN 31* 28*  CREATININE 1.01 0.82  CALCIUM 9.1 8.9  MG -- --  PHOS -- --   CBC:  Basename 04/25/12 0435 04/24/12 0645  WBC 16.7* 18.1*  NEUTROABS -- --  HGB 12.7* 13.1  HCT 38.0* 38.3*  MCV 100.3* 98.5  PLT 265 252   Cardiac Enzymes:  Basename 04/25/12 1025 04/23/12 1406  CKTOTAL 265* 870*  CKMB 5.1* 16.3*  CKMBINDEX -- --  TROPONINI -- --   BNP (last 3 results)  Basename 04/25/12 0435 04/24/12 0645 04/21/12 1551  PROBNP 5857.0* 7779.0* 18364.0*   Radiology/Studies:  No new data  Physical Exam: Blood pressure 137/65, pulse 67, temperature 98.1 F (36.7 C), temperature source Oral, resp. rate 18, height 5\' 8"  (1.727 m), weight 77.157 kg (170 lb 1.6 oz), SpO2 94.00%. Weight change: -3.943 kg (-8 lb 11.1 oz)   More alert today  ASSESSMENT:  1. Combined acute on chronic systolic and diastolic heart failure, improved with diuresis. BNP still greater than 5000 but lower than on admission.  Plan:  1. I'm concerned about the patient and feel that his long-term survivability is quite poor. He does appear to be stable and assuming that he is carefully monitored, he is ready for transition to skilled nursing facility.  I am not sure that he will be able to live independently  Selinda Eon 04/25/2012, 11:44 AM

## 2012-04-25 NOTE — Progress Notes (Signed)
Internal Medicine Teaching Service Attending Note Date: 04/25/2012  Patient name: Caleb Nash  Medical record number: [id number]  Date of birth: 30-May-1913    This patient has been seen and discussed with the house staff. Please see their note for complete details. I concur with their findings with the following additions/corrections:  Patient's breathing status is much better today. Patient's daughter was at the bedside. She was updated about the plan. BP 137/65  Pulse 67  Temp 98.1 F (36.7 C) (Oral)  Resp 18  Ht 5\' 8"  (1.727 m)  Wt 170 lb 1.6 oz (77.157 kg)  BMI 25.86 kg/m2  SpO2 94% Physical Exam: General: Vital signs reviewed and noted. Well-developed, well-nourished, in no acute distress; alert, appropriate and cooperative throughout examination.  Head: Normocephalic, atraumatic.  Eyes: PERRL, EOMI, No signs of anemia or jaundince.  Nose: Mucous membranes moist, not inflammed, nonerythematous.  Throat: Oropharynx nonerythematous, no exudate appreciated.   Neck: No deformities, masses, or tenderness noted.Supple, No carotid Bruits, no JVD.  Lungs:  Normal respiratory effort. Clear to auscultation BL without crackles or wheezes.  Heart: rrr. S1 and S2 normal without gallop, murmur, or rubs.  Abdomen:  BS normoactive. Soft, Nondistended, non-tender.  No masses or organomegaly.  Extremities:  trace pretibial edema.  Neurologic: A&O X3, CN II - XII are grossly intact. Motor strength is 5/5 in the all 4 extremities, Sensations intact to light touch, Cerebellar signs negative.  Skin: No visible rashes, scars.   Patient's labs were reviewed and unremarkable for a proBNP of 5857 which has decreased from the admission value.  Patient is slightly to be discharged to a skilled nursing facility today. He will continue to receive therapy for community-acquired pneumonia. Patient should be started on Lasix for concerns of fluid overload from diastolic heart failure. He will followup with  his cardiologist Dr. Katrinka Blazing and his primary care physician.  Lars Mage 04/25/2012, 11:26 AM

## 2012-04-26 DIAGNOSIS — M6281 Muscle weakness (generalized): Secondary | ICD-10-CM

## 2012-04-26 DIAGNOSIS — D7289 Other specified disorders of white blood cells: Secondary | ICD-10-CM

## 2012-04-26 DIAGNOSIS — J449 Chronic obstructive pulmonary disease, unspecified: Secondary | ICD-10-CM

## 2012-04-26 DIAGNOSIS — F1010 Alcohol abuse, uncomplicated: Secondary | ICD-10-CM

## 2012-04-26 DIAGNOSIS — J4489 Other specified chronic obstructive pulmonary disease: Secondary | ICD-10-CM

## 2012-04-26 HISTORY — DX: Muscle weakness (generalized): M62.81

## 2012-04-26 HISTORY — DX: Chronic obstructive pulmonary disease, unspecified: J44.9

## 2012-04-26 HISTORY — DX: Other specified disorders of white blood cells: D72.89

## 2012-04-26 HISTORY — DX: Other specified chronic obstructive pulmonary disease: J44.89

## 2012-04-26 HISTORY — DX: Alcohol abuse, uncomplicated: F10.10

## 2012-04-28 DIAGNOSIS — H409 Unspecified glaucoma: Secondary | ICD-10-CM

## 2012-04-28 DIAGNOSIS — I2510 Atherosclerotic heart disease of native coronary artery without angina pectoris: Secondary | ICD-10-CM

## 2012-04-28 DIAGNOSIS — T7840XA Allergy, unspecified, initial encounter: Secondary | ICD-10-CM

## 2012-04-28 DIAGNOSIS — I4891 Unspecified atrial fibrillation: Secondary | ICD-10-CM

## 2012-04-28 DIAGNOSIS — Z950 Presence of cardiac pacemaker: Secondary | ICD-10-CM

## 2012-04-28 DIAGNOSIS — G4700 Insomnia, unspecified: Secondary | ICD-10-CM

## 2012-04-28 DIAGNOSIS — K2100 Gastro-esophageal reflux disease with esophagitis, without bleeding: Secondary | ICD-10-CM

## 2012-04-28 DIAGNOSIS — Z9181 History of falling: Secondary | ICD-10-CM

## 2012-04-28 HISTORY — DX: Insomnia, unspecified: G47.00

## 2012-04-28 HISTORY — DX: Unspecified glaucoma: H40.9

## 2012-04-28 HISTORY — DX: Atherosclerotic heart disease of native coronary artery without angina pectoris: I25.10

## 2012-04-28 HISTORY — DX: History of falling: Z91.81

## 2012-04-28 HISTORY — DX: Presence of cardiac pacemaker: Z95.0

## 2012-04-28 HISTORY — DX: Allergy, unspecified, initial encounter: T78.40XA

## 2012-04-28 HISTORY — DX: Unspecified atrial fibrillation: I48.91

## 2012-04-28 HISTORY — DX: Gastro-esophageal reflux disease with esophagitis, without bleeding: K21.00

## 2012-05-03 DIAGNOSIS — Z950 Presence of cardiac pacemaker: Secondary | ICD-10-CM | POA: Diagnosis not present

## 2012-05-03 DIAGNOSIS — M6281 Muscle weakness (generalized): Secondary | ICD-10-CM | POA: Diagnosis not present

## 2012-05-03 DIAGNOSIS — N400 Enlarged prostate without lower urinary tract symptoms: Secondary | ICD-10-CM | POA: Diagnosis not present

## 2012-05-03 DIAGNOSIS — I10 Essential (primary) hypertension: Secondary | ICD-10-CM | POA: Diagnosis not present

## 2012-05-03 DIAGNOSIS — I2510 Atherosclerotic heart disease of native coronary artery without angina pectoris: Secondary | ICD-10-CM | POA: Diagnosis not present

## 2012-05-31 DIAGNOSIS — I10 Essential (primary) hypertension: Secondary | ICD-10-CM | POA: Diagnosis not present

## 2012-05-31 DIAGNOSIS — D7289 Other specified disorders of white blood cells: Secondary | ICD-10-CM | POA: Diagnosis not present

## 2012-05-31 DIAGNOSIS — R609 Edema, unspecified: Secondary | ICD-10-CM | POA: Diagnosis not present

## 2012-05-31 DIAGNOSIS — I509 Heart failure, unspecified: Secondary | ICD-10-CM | POA: Diagnosis not present

## 2012-05-31 DIAGNOSIS — I4891 Unspecified atrial fibrillation: Secondary | ICD-10-CM | POA: Diagnosis not present

## 2012-06-07 DIAGNOSIS — Q245 Malformation of coronary vessels: Secondary | ICD-10-CM | POA: Diagnosis not present

## 2012-06-07 DIAGNOSIS — R29818 Other symptoms and signs involving the nervous system: Secondary | ICD-10-CM | POA: Diagnosis not present

## 2012-06-11 DIAGNOSIS — I2510 Atherosclerotic heart disease of native coronary artery without angina pectoris: Secondary | ICD-10-CM | POA: Diagnosis not present

## 2012-06-11 DIAGNOSIS — I10 Essential (primary) hypertension: Secondary | ICD-10-CM | POA: Diagnosis not present

## 2012-06-11 DIAGNOSIS — R269 Unspecified abnormalities of gait and mobility: Secondary | ICD-10-CM

## 2012-06-11 DIAGNOSIS — L600 Ingrowing nail: Secondary | ICD-10-CM

## 2012-06-11 DIAGNOSIS — N400 Enlarged prostate without lower urinary tract symptoms: Secondary | ICD-10-CM | POA: Diagnosis not present

## 2012-06-11 DIAGNOSIS — M6281 Muscle weakness (generalized): Secondary | ICD-10-CM | POA: Diagnosis not present

## 2012-06-11 DIAGNOSIS — I4891 Unspecified atrial fibrillation: Secondary | ICD-10-CM | POA: Diagnosis not present

## 2012-06-11 HISTORY — DX: Unspecified abnormalities of gait and mobility: R26.9

## 2012-06-11 HISTORY — DX: Ingrowing nail: L60.0

## 2012-06-12 DIAGNOSIS — Q245 Malformation of coronary vessels: Secondary | ICD-10-CM | POA: Diagnosis not present

## 2012-06-12 DIAGNOSIS — R29818 Other symptoms and signs involving the nervous system: Secondary | ICD-10-CM | POA: Diagnosis not present

## 2012-06-18 DIAGNOSIS — R29818 Other symptoms and signs involving the nervous system: Secondary | ICD-10-CM | POA: Diagnosis not present

## 2012-06-18 DIAGNOSIS — Q245 Malformation of coronary vessels: Secondary | ICD-10-CM | POA: Diagnosis not present

## 2012-06-19 DIAGNOSIS — H35059 Retinal neovascularization, unspecified, unspecified eye: Secondary | ICD-10-CM | POA: Diagnosis not present

## 2012-06-19 DIAGNOSIS — H35359 Cystoid macular degeneration, unspecified eye: Secondary | ICD-10-CM | POA: Diagnosis not present

## 2012-06-19 DIAGNOSIS — H353290 Exudative age-related macular degeneration, unspecified eye, stage unspecified: Secondary | ICD-10-CM | POA: Diagnosis not present

## 2012-06-20 DIAGNOSIS — R29818 Other symptoms and signs involving the nervous system: Secondary | ICD-10-CM | POA: Diagnosis not present

## 2012-06-20 DIAGNOSIS — Q245 Malformation of coronary vessels: Secondary | ICD-10-CM | POA: Diagnosis not present

## 2012-06-20 DIAGNOSIS — I495 Sick sinus syndrome: Secondary | ICD-10-CM | POA: Diagnosis not present

## 2012-06-21 DIAGNOSIS — Q245 Malformation of coronary vessels: Secondary | ICD-10-CM | POA: Diagnosis not present

## 2012-06-21 DIAGNOSIS — R29818 Other symptoms and signs involving the nervous system: Secondary | ICD-10-CM | POA: Diagnosis not present

## 2012-06-24 DIAGNOSIS — R269 Unspecified abnormalities of gait and mobility: Secondary | ICD-10-CM | POA: Diagnosis not present

## 2012-06-24 DIAGNOSIS — Q245 Malformation of coronary vessels: Secondary | ICD-10-CM | POA: Diagnosis not present

## 2012-06-24 DIAGNOSIS — R488 Other symbolic dysfunctions: Secondary | ICD-10-CM | POA: Diagnosis not present

## 2012-06-25 DIAGNOSIS — R269 Unspecified abnormalities of gait and mobility: Secondary | ICD-10-CM | POA: Diagnosis not present

## 2012-06-25 DIAGNOSIS — Q245 Malformation of coronary vessels: Secondary | ICD-10-CM | POA: Diagnosis not present

## 2012-06-25 DIAGNOSIS — R488 Other symbolic dysfunctions: Secondary | ICD-10-CM | POA: Diagnosis not present

## 2012-06-27 DIAGNOSIS — R269 Unspecified abnormalities of gait and mobility: Secondary | ICD-10-CM | POA: Diagnosis not present

## 2012-06-27 DIAGNOSIS — R488 Other symbolic dysfunctions: Secondary | ICD-10-CM | POA: Diagnosis not present

## 2012-06-27 DIAGNOSIS — Q245 Malformation of coronary vessels: Secondary | ICD-10-CM | POA: Diagnosis not present

## 2012-06-28 DIAGNOSIS — R488 Other symbolic dysfunctions: Secondary | ICD-10-CM | POA: Diagnosis not present

## 2012-06-28 DIAGNOSIS — Q245 Malformation of coronary vessels: Secondary | ICD-10-CM | POA: Diagnosis not present

## 2012-06-28 DIAGNOSIS — R269 Unspecified abnormalities of gait and mobility: Secondary | ICD-10-CM | POA: Diagnosis not present

## 2012-07-01 DIAGNOSIS — Q245 Malformation of coronary vessels: Secondary | ICD-10-CM | POA: Diagnosis not present

## 2012-07-01 DIAGNOSIS — R488 Other symbolic dysfunctions: Secondary | ICD-10-CM | POA: Diagnosis not present

## 2012-07-01 DIAGNOSIS — R269 Unspecified abnormalities of gait and mobility: Secondary | ICD-10-CM | POA: Diagnosis not present

## 2012-07-02 DIAGNOSIS — R269 Unspecified abnormalities of gait and mobility: Secondary | ICD-10-CM | POA: Diagnosis not present

## 2012-07-02 DIAGNOSIS — R488 Other symbolic dysfunctions: Secondary | ICD-10-CM | POA: Diagnosis not present

## 2012-07-02 DIAGNOSIS — Q245 Malformation of coronary vessels: Secondary | ICD-10-CM | POA: Diagnosis not present

## 2012-07-05 DIAGNOSIS — R269 Unspecified abnormalities of gait and mobility: Secondary | ICD-10-CM | POA: Diagnosis not present

## 2012-07-05 DIAGNOSIS — IMO0002 Reserved for concepts with insufficient information to code with codable children: Secondary | ICD-10-CM | POA: Diagnosis not present

## 2012-07-05 DIAGNOSIS — R488 Other symbolic dysfunctions: Secondary | ICD-10-CM | POA: Diagnosis not present

## 2012-07-05 DIAGNOSIS — Q245 Malformation of coronary vessels: Secondary | ICD-10-CM | POA: Diagnosis not present

## 2012-07-23 ENCOUNTER — Other Ambulatory Visit: Payer: Self-pay | Admitting: *Deleted

## 2012-07-23 MED ORDER — ZOLPIDEM TARTRATE 5 MG PO TABS: 5.0000 mg | ORAL_TABLET | Freq: Every day | ORAL | Status: DC

## 2012-07-24 DIAGNOSIS — H35059 Retinal neovascularization, unspecified, unspecified eye: Secondary | ICD-10-CM | POA: Diagnosis not present

## 2012-07-24 DIAGNOSIS — H353290 Exudative age-related macular degeneration, unspecified eye, stage unspecified: Secondary | ICD-10-CM | POA: Diagnosis not present

## 2012-07-24 DIAGNOSIS — H35359 Cystoid macular degeneration, unspecified eye: Secondary | ICD-10-CM | POA: Diagnosis not present

## 2012-07-31 DIAGNOSIS — H353290 Exudative age-related macular degeneration, unspecified eye, stage unspecified: Secondary | ICD-10-CM | POA: Diagnosis not present

## 2012-07-31 DIAGNOSIS — H35059 Retinal neovascularization, unspecified, unspecified eye: Secondary | ICD-10-CM | POA: Diagnosis not present

## 2012-09-04 DIAGNOSIS — H353290 Exudative age-related macular degeneration, unspecified eye, stage unspecified: Secondary | ICD-10-CM | POA: Diagnosis not present

## 2012-09-04 DIAGNOSIS — H35059 Retinal neovascularization, unspecified, unspecified eye: Secondary | ICD-10-CM | POA: Diagnosis not present

## 2012-09-06 DIAGNOSIS — Z09 Encounter for follow-up examination after completed treatment for conditions other than malignant neoplasm: Secondary | ICD-10-CM | POA: Diagnosis not present

## 2012-09-11 DIAGNOSIS — H353290 Exudative age-related macular degeneration, unspecified eye, stage unspecified: Secondary | ICD-10-CM | POA: Diagnosis not present

## 2012-09-11 DIAGNOSIS — H35059 Retinal neovascularization, unspecified, unspecified eye: Secondary | ICD-10-CM | POA: Diagnosis not present

## 2012-09-13 ENCOUNTER — Non-Acute Institutional Stay: Payer: Medicare Other | Admitting: Nurse Practitioner

## 2012-09-13 DIAGNOSIS — I10 Essential (primary) hypertension: Secondary | ICD-10-CM

## 2012-09-13 DIAGNOSIS — I509 Heart failure, unspecified: Secondary | ICD-10-CM

## 2012-09-13 DIAGNOSIS — F329 Major depressive disorder, single episode, unspecified: Secondary | ICD-10-CM | POA: Diagnosis not present

## 2012-09-13 DIAGNOSIS — K219 Gastro-esophageal reflux disease without esophagitis: Secondary | ICD-10-CM

## 2012-09-13 DIAGNOSIS — K5900 Constipation, unspecified: Secondary | ICD-10-CM

## 2012-09-13 DIAGNOSIS — M1990 Unspecified osteoarthritis, unspecified site: Secondary | ICD-10-CM | POA: Diagnosis not present

## 2012-09-13 DIAGNOSIS — M25519 Pain in unspecified shoulder: Secondary | ICD-10-CM | POA: Diagnosis not present

## 2012-09-13 DIAGNOSIS — N400 Enlarged prostate without lower urinary tract symptoms: Secondary | ICD-10-CM

## 2012-09-13 DIAGNOSIS — R0989 Other specified symptoms and signs involving the circulatory and respiratory systems: Secondary | ICD-10-CM

## 2012-09-13 HISTORY — DX: Constipation, unspecified: K59.00

## 2012-09-13 HISTORY — DX: Gastro-esophageal reflux disease without esophagitis: K21.9

## 2012-09-13 NOTE — Progress Notes (Signed)
Patient ID: Caleb Nash, male   DOB: 1913/11/25, 79 y.o.   MRN: [id number]  Chief Complaint:  Chief Complaint  Patient presents with  . Medical Managment of Chronic Issues    left shoulder pain during exercise and left lower back pain.      HPI:   Problem List Items Addressed This Visit   Depression (Chronic)     Stable on Sertraline 50mg .     Labile hypertension (Chronic)     Controlled on Lisinopril 20mg .     Degenerative joint disease - Primary (Chronic)     C/o left shoulder pain down to elbow, having discomfort during restorative exercising, thinks he may have injured his shoulder during last fall in apartment. Chronic multiple joints arthritis-s/p R+L knee replacements, s/p right shoulder rotator cuff repair, lumbar spine injx2 with little efficacy-then instructed to apply Voltaren gel with little improvement. R lower back pain on and off--not interfering his ADLs. Left shoulder pain with ROM became problematic--X-ray 3 views left shoulder to evaluate.     BPH (benign prostatic hyperplasia)     Stable on Tamsulosin 0.4mg     CHF (congestive heart failure)     Compensated on Furosemide 40mg , update CMP    Unspecified constipation     Stable on MiraLax daily    GERD (gastroesophageal reflux disease)     Stable on Omeprazole.        Review of Systems:  Review of Systems  Constitutional: Negative for fever, chills, weight loss and malaise/fatigue.  HENT: Positive for hearing loss. Negative for congestion, sore throat, neck pain and ear discharge.   Eyes: Negative for blurred vision, double vision and photophobia.       Macular degeneration OU--f/u Ophthalmology, 2 eye drops. Had inj >20x in the past per patient.   Respiratory: Negative for cough, sputum production, shortness of breath and wheezing.   Cardiovascular: Negative for chest pain, palpitations, orthopnea, claudication, leg swelling and PND.  Gastrointestinal: Negative for heartburn, nausea, vomiting, abdominal  pain, diarrhea, constipation and blood in stool.  Genitourinary: Negative for dysuria, urgency, frequency, hematuria and flank pain.  Musculoskeletal: Positive for back pain and joint pain (left shoudler). Negative for myalgias and falls.  Skin: Negative for itching and rash.  Neurological: Negative for dizziness, tingling, tremors, sensory change, speech change, focal weakness, seizures, loss of consciousness, weakness and headaches.  Endo/Heme/Allergies: Negative for environmental allergies and polydipsia. Does not bruise/bleed easily.  Psychiatric/Behavioral: Negative for depression, hallucinations, memory loss and substance abuse. The patient is not nervous/anxious and does not have insomnia.      Medications: Patient's Medications  New Prescriptions   No medications on file  Previous Medications   ASPIRIN 81 MG TABLET    Take 81 mg by mouth daily.   BIMATOPROST (LUMIGAN) 0.01 % SOLN    Apply 1 drop to eye at bedtime.   BRIMONIDINE-TIMOLOL (COMBIGAN) 0.2-0.5 % OPHTHALMIC SOLUTION    Place 1 drop into both eyes every 12 (twelve) hours.   FUROSEMIDE (LASIX) 40 MG TABLET    Take 1 tablet (40 mg total) by mouth daily.   LISINOPRIL (PRINIVIL,ZESTRIL) 20 MG TABLET    Take 20 mg by mouth 2 (two) times daily.   LORATADINE (CLARITIN) 10 MG TABLET    Take 10 mg by mouth daily.   NITROGLYCERIN (NITROSTAT) 0.4 MG SL TABLET    Place 0.4 mg under the tongue every 5 (five) minutes as needed. For chest pain   OMEPRAZOLE (PRILOSEC) 20 MG CAPSULE  Take 20 mg by mouth daily.   POLYETHYLENE GLYCOL (MIRALAX / GLYCOLAX) PACKET    Take 17 g by mouth daily as needed. For constipation   SERTRALINE (ZOLOFT) 50 MG TABLET    Take 50 mg by mouth daily.   TAMSULOSIN HCL (FLOMAX) 0.4 MG CAPS    Take 0.4 mg by mouth daily.   TRAMADOL (ULTRAM) 50 MG TABLET    Take 100 mg by mouth every 4 (four) hours as needed. For pain   ZOLPIDEM (AMBIEN) 5 MG TABLET    Take 1 tablet (5 mg total) by mouth at bedtime.  Modified  Medications   No medications on file  Discontinued Medications   No medications on file     Physical Exam: Physical Exam  Constitutional: He is oriented to person, place, and time. He appears well-developed and well-nourished. No distress.  HENT:  Head: Normocephalic and atraumatic.  Right Ear: External ear normal.  Left Ear: External ear normal.  Mouth/Throat: No oropharyngeal exudate.  Eyes: Conjunctivae and EOM are normal. Pupils are equal, round, and reactive to light. No scleral icterus.  Neck: Normal range of motion. Neck supple. No JVD present. No tracheal deviation present. No thyromegaly present.  Cardiovascular: Normal rate, regular rhythm and normal heart sounds.   No murmur heard. Pulmonary/Chest: Effort normal. No respiratory distress. He has decreased breath sounds. He has no wheezes. He has rales in the right lower field and the left lower field.  Abdominal: He exhibits no distension. There is no tenderness. There is no rebound.  Musculoskeletal: He exhibits no edema.  Slightly decreased ROM of the left shoulder with pain.   Lymphadenopathy:    He has no cervical adenopathy.  Neurological: He is alert and oriented to person, place, and time. He has normal reflexes. He displays normal reflexes. No cranial nerve deficit. He exhibits normal muscle tone. Coordination normal.  Skin: Skin is warm and dry. No rash noted. He is not diaphoretic. No erythema.  Psychiatric: He has a normal mood and affect. His behavior is normal. Judgment and thought content normal.     Filed Vitals:   09/13/12 1405  BP: 128/70  Pulse: 62  Temp: 96.9 F (36.1 C)  TempSrc: Tympanic  Resp: 18      Labs reviewed: Basic Metabolic Panel:  Recent Labs  30/86/57 1550 04/22/12 0004  04/23/12 0557 04/24/12 0645 04/25/12 0435  NA 135  --   < > 139 143 146*  K 4.7  --   < > 3.5 3.4* 4.1  CL 93*  --   < > 108 110 109  CO2 14*  --   < > 19 19 24   GLUCOSE 154*  --   < > 158* 166* 143*   BUN 42*  --   < > 26* 28* 31*  CREATININE 1.14  --   < > 0.78 0.82 1.01  CALCIUM 9.9  --   < > 8.7 8.9 9.1  MG  --  1.9  --   --   --   --   PHOS  --  4.3  --   --   --   --   < > = values in this interval not displayed.  Liver Function Tests:  Recent Labs  04/21/12 1550  AST 138*  ALT 41  ALKPHOS 93  BILITOT 0.7  PROT 7.5  ALBUMIN 3.4*    CBC:  Recent Labs  04/21/12 1550  04/23/12 0557 04/24/12 0645 04/25/12 0435  WBC 17.3*  < >  12.5* 18.1* 16.7*  NEUTROABS 15.1*  --   --   --   --   HGB 13.4  < > 11.5* 13.1 12.7*  HCT 39.9  < > 33.1* 38.3* 38.0*  MCV 98.3  < > 97.4 98.5 100.3*  PLT 239  < > 235 252 265  < > = values in this interval not displayed.  Anemia Panel: No results found for this basename: IRON, FOLATE, VITAMINB12,  in the last 8760 hours  Significant Diagnostic Results:     Assessment/Plan Degenerative joint disease C/o left shoulder pain down to elbow, having discomfort during restorative exercising, thinks he may have injured his shoulder during last fall in apartment. Chronic multiple joints arthritis-s/p R+L knee replacements, s/p right shoulder rotator cuff repair, lumbar spine injx2 with little efficacy-then instructed to apply Voltaren gel with little improvement. R lower back pain on and off--not interfering his ADLs. Left shoulder pain with ROM became problematic--X-ray 3 views left shoulder to evaluate.   Labile hypertension Controlled on Lisinopril 20mg .   CHF (congestive heart failure) Compensated on Furosemide 40mg , update CMP  Depression Stable on Sertraline 50mg .   BPH (benign prostatic hyperplasia) Stable on Tamsulosin 0.4mg   Unspecified constipation Stable on MiraLax daily  GERD (gastroesophageal reflux disease) Stable on Omeprazole.       Family/ staff Communication: observe the patient.    Goals of care: AL   Labs/tests ordered CBC, CMP, TSH, X-ray left shoulder 3 views.

## 2012-09-13 NOTE — Assessment & Plan Note (Signed)
Controlled on Lisinopril 20mg 

## 2012-09-13 NOTE — Assessment & Plan Note (Signed)
Compensated on Furosemide 40mg , update CMP

## 2012-09-13 NOTE — Assessment & Plan Note (Signed)
Stable on MiraLax daily.  

## 2012-09-13 NOTE — Assessment & Plan Note (Signed)
Stable on Tamsulosin 0.4mg 

## 2012-09-13 NOTE — Assessment & Plan Note (Signed)
Stable on Sertraline 50mg    

## 2012-09-13 NOTE — Assessment & Plan Note (Addendum)
C/o left shoulder pain down to elbow, having discomfort during restorative exercising, thinks he may have injured his shoulder during last fall in apartment. Chronic multiple joints arthritis-s/p R+L knee replacements, s/p right shoulder rotator cuff repair, lumbar spine injx2 with little efficacy-then instructed to apply Voltaren gel with little improvement. R lower back pain on and off--not interfering his ADLs. Left shoulder pain with ROM became problematic--X-ray 3 views left shoulder to evaluate.

## 2012-09-13 NOTE — Assessment & Plan Note (Signed)
Stable on Omeprazole.  

## 2012-09-24 DIAGNOSIS — Z950 Presence of cardiac pacemaker: Secondary | ICD-10-CM | POA: Diagnosis not present

## 2012-09-24 DIAGNOSIS — I4891 Unspecified atrial fibrillation: Secondary | ICD-10-CM | POA: Diagnosis not present

## 2012-09-30 DIAGNOSIS — H3530 Unspecified macular degeneration: Secondary | ICD-10-CM | POA: Diagnosis not present

## 2012-10-16 DIAGNOSIS — H35059 Retinal neovascularization, unspecified, unspecified eye: Secondary | ICD-10-CM | POA: Diagnosis not present

## 2012-10-16 DIAGNOSIS — H353290 Exudative age-related macular degeneration, unspecified eye, stage unspecified: Secondary | ICD-10-CM | POA: Diagnosis not present

## 2012-10-16 DIAGNOSIS — H35359 Cystoid macular degeneration, unspecified eye: Secondary | ICD-10-CM | POA: Diagnosis not present

## 2012-10-17 ENCOUNTER — Ambulatory Visit (INDEPENDENT_AMBULATORY_CARE_PROVIDER_SITE_OTHER): Payer: Medicare Other | Admitting: Internal Medicine

## 2012-10-17 ENCOUNTER — Encounter: Payer: Self-pay | Admitting: Internal Medicine

## 2012-10-17 ENCOUNTER — Encounter: Payer: Self-pay | Admitting: *Deleted

## 2012-10-17 VITALS — BP 124/46 | HR 64 | Ht 68.0 in | Wt 171.0 lb

## 2012-10-17 DIAGNOSIS — I10 Essential (primary) hypertension: Secondary | ICD-10-CM | POA: Insufficient documentation

## 2012-10-17 DIAGNOSIS — I495 Sick sinus syndrome: Secondary | ICD-10-CM | POA: Insufficient documentation

## 2012-10-17 DIAGNOSIS — I2510 Atherosclerotic heart disease of native coronary artery without angina pectoris: Secondary | ICD-10-CM

## 2012-10-17 DIAGNOSIS — I471 Supraventricular tachycardia: Secondary | ICD-10-CM | POA: Diagnosis not present

## 2012-10-17 HISTORY — DX: Essential (primary) hypertension: I10

## 2012-10-17 LAB — PACEMAKER DEVICE OBSERVATION
BATTERY VOLTAGE: 2.61 V
BMOD-0005RV: 95 {beats}/min
BRDY-0002RV: 65 {beats}/min
RV LEAD IMPEDENCE PM: 589 Ohm
VENTRICULAR PACING PM: 95

## 2012-10-17 NOTE — Progress Notes (Signed)
 PCP:  Dr Green Primary Cardiologist:  Dr Smith  Caleb Nash is a 79 y.o. male with a h/o symptomatic bradycardia sp PPM (MDT) by Dr Edmunds who presents today to establish care in the Electrophysiology device clinic.  The patient reports doing very well since having a pacemaker implanted and remains very active despite his age.  He fell 12/13 and this has led to him moving to assisted living.  He recently reached ERI battery status.  Since that time, he has developed fatigue and SOB.  Today, he  denies symptoms of palpitations, chest pain, shortness of breath, orthopnea, PND, lower extremity edema, dizziness, presyncope, syncope, or neurologic sequela.  The patientis tolerating medications without difficulties and is otherwise without complaint today.   Past Medical History  Diagnosis Date  . Osteoarthritis   . HTN (hypertension)   . BPH (benign prostatic hyperplasia)   . Sick sinus syndrome 10/14/2003    MDT EnPulse implanted by Dr Edmunds for SSS  . CAD (coronary artery disease)     s/p CABG 2005  . Macular degeneration   . Hyperlipidemia   . Depression     mild per pt  . Sporotrichosis     remote   Past Surgical History  Procedure Laterality Date  . Pacemaker insertion  10/14/2003    MDT Implanted by Dr Edmunds for SSS  . Coronary artery bypass graft    . Bilateral knee arthroplasty      History   Social History  . Marital Status: Single    Spouse Name: N/A    Number of Children: N/A  . Years of Education: N/A   Occupational History  . Not on file.   Social History Main Topics  . Smoking status: Never Smoker   . Smokeless tobacco: Not on file  . Alcohol Use: No  . Drug Use: No  . Sexually Active: Not on file   Other Topics Concern  . Not on file   Social History Narrative   Pt lives in assisted living at [facility] following a fall 12/13.   Retired CPA.    Family History  Problem Relation Age of Onset  . Cancer      Allergies  Allergen  Reactions  . Celebrex (Celecoxib) Other (See Comments)    unknown  . Sulfur Other (See Comments)    unknown  . Vioxx (Rofecoxib) Other (See Comments)    unknown    Current Outpatient Prescriptions  Medication Sig Dispense Refill  . aspirin 81 MG tablet Take 81 mg by mouth daily.      . bimatoprost (LUMIGAN) 0.01 % SOLN Apply 1 drop to eye at bedtime.      . brimonidine-timolol (COMBIGAN) 0.2-0.5 % ophthalmic solution Place 1 drop into both eyes every 12 (twelve) hours.      . furosemide (LASIX) 40 MG tablet Take 1 tablet (40 mg total) by mouth daily.  30 tablet  0  . lisinopril (PRINIVIL,ZESTRIL) 20 MG tablet Take 20 mg by mouth 2 (two) times daily.      . loratadine (CLARITIN) 10 MG tablet Take 10 mg by mouth daily.      . nitroGLYCERIN (NITROSTAT) 0.4 MG SL tablet Place 0.4 mg under the tongue every 5 (five) minutes as needed. For chest pain      . omeprazole (PRILOSEC) 20 MG capsule Take 20 mg by mouth daily.      . polyethylene glycol (MIRALAX / GLYCOLAX) packet Take 17 g by mouth daily   as needed. For constipation      . sertraline (ZOLOFT) 50 MG tablet Take 50 mg by mouth daily.      . Tamsulosin HCl (FLOMAX) 0.4 MG CAPS Take 0.4 mg by mouth daily.      . traMADol (ULTRAM) 50 MG tablet Take 100 mg by mouth every 4 (four) hours as needed. For pain      . zolpidem (AMBIEN) 5 MG tablet Take 1 tablet (5 mg total) by mouth at bedtime.  30 tablet  5   No current facility-administered medications for this visit.    ROS- all systems are reviewed and negative except as per HPI  Physical Exam: Filed Vitals:   10/17/12 1035  BP: 124/46  Pulse: 64  Height: 5' 8" (1.727 m)  Weight: 171 lb (77.565 kg)    GEN- The patient is well appearing, alert and oriented x 3 today.   Head- normocephalic, atraumatic Eyes-  Sclera clear, conjunctiva pink Ears- hearing intact Oropharynx- clear Neck- supple, no JVP Lymph- no cervical lymphadenopathy Lungs- Clear to ausculation bilaterally, normal  work of breathing Chest- R sided pacemaker pocket is well healed Heart- Regular rate and rhythm,  GI- soft, NT, ND, + BS Extremities- no clubbing, cyanosis, or edema MS- age appropriate muscle atrophy, walks with a cane Skin- no rash or lesion Psych- euthymic mood, full affect, very pleasant Neuro- strength and sensation are intact  Pacemaker interrogation- reviewed in detail today,  See PACEART report  Assessment and Plan:  1. Sick sinus syndrome The patients pacemaker is at ERI.  He has developed symptoms with VVI reversion.  I would recommend pacemaker generator change.  Risks, benefits, and alternatives to this procedure were discussed with the patient who understands and wishes to proceed.  We will schedule the procedure at the next available time.  2. CAD Stable No change required today  3. HTN Stable No change required today  

## 2012-10-17 NOTE — Patient Instructions (Addendum)
Will have generator changed on pace maker on 11/01/12   See instruction sheet

## 2012-10-22 ENCOUNTER — Encounter: Payer: Self-pay | Admitting: Internal Medicine

## 2012-10-22 DIAGNOSIS — I4891 Unspecified atrial fibrillation: Secondary | ICD-10-CM | POA: Diagnosis not present

## 2012-10-23 ENCOUNTER — Other Ambulatory Visit: Payer: Self-pay | Admitting: *Deleted

## 2012-10-23 ENCOUNTER — Encounter (HOSPITAL_COMMUNITY): Payer: Self-pay

## 2012-10-23 DIAGNOSIS — H35359 Cystoid macular degeneration, unspecified eye: Secondary | ICD-10-CM | POA: Diagnosis not present

## 2012-10-23 DIAGNOSIS — H353290 Exudative age-related macular degeneration, unspecified eye, stage unspecified: Secondary | ICD-10-CM | POA: Diagnosis not present

## 2012-10-23 DIAGNOSIS — Z45018 Encounter for adjustment and management of other part of cardiac pacemaker: Secondary | ICD-10-CM

## 2012-10-23 DIAGNOSIS — I4891 Unspecified atrial fibrillation: Secondary | ICD-10-CM

## 2012-10-23 LAB — BASIC METABOLIC PANEL
Creatinine: 1.4 mg/dL — AB (ref 0.6–1.3)
Glucose: 106 mg/dL
Potassium: 4.7 mmol/L (ref 3.4–5.3)

## 2012-10-23 LAB — CBC AND DIFFERENTIAL
Platelets: 157 10*3/uL (ref 150–399)
WBC: 6.9 10^3/mL

## 2012-10-28 DIAGNOSIS — N400 Enlarged prostate without lower urinary tract symptoms: Secondary | ICD-10-CM | POA: Diagnosis not present

## 2012-10-28 DIAGNOSIS — I2510 Atherosclerotic heart disease of native coronary artery without angina pectoris: Secondary | ICD-10-CM | POA: Diagnosis not present

## 2012-10-28 DIAGNOSIS — I495 Sick sinus syndrome: Secondary | ICD-10-CM | POA: Diagnosis not present

## 2012-10-28 DIAGNOSIS — I10 Essential (primary) hypertension: Secondary | ICD-10-CM | POA: Diagnosis not present

## 2012-10-28 DIAGNOSIS — Z79899 Other long term (current) drug therapy: Secondary | ICD-10-CM | POA: Diagnosis not present

## 2012-10-28 DIAGNOSIS — E785 Hyperlipidemia, unspecified: Secondary | ICD-10-CM | POA: Diagnosis not present

## 2012-10-28 DIAGNOSIS — B429 Sporotrichosis, unspecified: Secondary | ICD-10-CM | POA: Diagnosis not present

## 2012-10-28 DIAGNOSIS — H3530 Unspecified macular degeneration: Secondary | ICD-10-CM | POA: Diagnosis not present

## 2012-10-28 DIAGNOSIS — Z45018 Encounter for adjustment and management of other part of cardiac pacemaker: Secondary | ICD-10-CM | POA: Diagnosis not present

## 2012-10-28 DIAGNOSIS — Z888 Allergy status to other drugs, medicaments and biological substances status: Secondary | ICD-10-CM | POA: Diagnosis not present

## 2012-10-28 DIAGNOSIS — F329 Major depressive disorder, single episode, unspecified: Secondary | ICD-10-CM | POA: Diagnosis not present

## 2012-10-28 DIAGNOSIS — Z951 Presence of aortocoronary bypass graft: Secondary | ICD-10-CM | POA: Diagnosis not present

## 2012-10-28 DIAGNOSIS — Z7982 Long term (current) use of aspirin: Secondary | ICD-10-CM | POA: Diagnosis not present

## 2012-10-28 MED ORDER — SODIUM CHLORIDE 0.9 % IV SOLN
INTRAVENOUS | Status: DC
  Administered 2012-10-29: 12:00:00 via INTRAVENOUS

## 2012-10-28 MED ORDER — SODIUM CHLORIDE 0.9 % IR SOLN
80.0000 mg | Status: AC
  Filled 2012-10-28: qty 2

## 2012-10-28 MED ORDER — CHLORHEXIDINE GLUCONATE 4 % EX LIQD
60.0000 mL | Freq: Once | CUTANEOUS | Status: DC
  Filled 2012-10-28: qty 60

## 2012-10-28 MED ORDER — CEFAZOLIN SODIUM-DEXTROSE 2-3 GM-% IV SOLR
2.0000 g | INTRAVENOUS | Status: AC
  Filled 2012-10-28 (×2): qty 50

## 2012-10-29 ENCOUNTER — Encounter (HOSPITAL_COMMUNITY)
Admission: RE | Disposition: A | Discharge status: Home / Self Care | Payer: Self-pay | Source: Ambulatory Visit | Attending: Internal Medicine

## 2012-10-29 ENCOUNTER — Ambulatory Visit (HOSPITAL_COMMUNITY)
Admission: RE | Admit: 2012-10-29 | Discharge: 2012-10-29 | Disposition: A | Discharge status: Home / Self Care | Payer: Medicare Other | Source: Ambulatory Visit | Attending: Internal Medicine | Admitting: Internal Medicine

## 2012-10-29 ENCOUNTER — Other Ambulatory Visit: Payer: Self-pay

## 2012-10-29 DIAGNOSIS — I10 Essential (primary) hypertension: Secondary | ICD-10-CM | POA: Insufficient documentation

## 2012-10-29 DIAGNOSIS — Z79899 Other long term (current) drug therapy: Secondary | ICD-10-CM | POA: Insufficient documentation

## 2012-10-29 DIAGNOSIS — I2510 Atherosclerotic heart disease of native coronary artery without angina pectoris: Secondary | ICD-10-CM | POA: Insufficient documentation

## 2012-10-29 DIAGNOSIS — F3289 Other specified depressive episodes: Secondary | ICD-10-CM | POA: Insufficient documentation

## 2012-10-29 DIAGNOSIS — N400 Enlarged prostate without lower urinary tract symptoms: Secondary | ICD-10-CM | POA: Insufficient documentation

## 2012-10-29 DIAGNOSIS — Z7982 Long term (current) use of aspirin: Secondary | ICD-10-CM | POA: Insufficient documentation

## 2012-10-29 DIAGNOSIS — Z950 Presence of cardiac pacemaker: Secondary | ICD-10-CM | POA: Diagnosis present

## 2012-10-29 DIAGNOSIS — I495 Sick sinus syndrome: Secondary | ICD-10-CM | POA: Diagnosis not present

## 2012-10-29 DIAGNOSIS — Z45018 Encounter for adjustment and management of other part of cardiac pacemaker: Secondary | ICD-10-CM | POA: Diagnosis not present

## 2012-10-29 DIAGNOSIS — Z951 Presence of aortocoronary bypass graft: Secondary | ICD-10-CM | POA: Insufficient documentation

## 2012-10-29 DIAGNOSIS — H3530 Unspecified macular degeneration: Secondary | ICD-10-CM | POA: Insufficient documentation

## 2012-10-29 DIAGNOSIS — B429 Sporotrichosis, unspecified: Secondary | ICD-10-CM | POA: Insufficient documentation

## 2012-10-29 DIAGNOSIS — F329 Major depressive disorder, single episode, unspecified: Secondary | ICD-10-CM | POA: Insufficient documentation

## 2012-10-29 DIAGNOSIS — E785 Hyperlipidemia, unspecified: Secondary | ICD-10-CM | POA: Insufficient documentation

## 2012-10-29 DIAGNOSIS — I4891 Unspecified atrial fibrillation: Secondary | ICD-10-CM

## 2012-10-29 DIAGNOSIS — Z888 Allergy status to other drugs, medicaments and biological substances status: Secondary | ICD-10-CM | POA: Insufficient documentation

## 2012-10-29 HISTORY — PX: PACEMAKER GENERATOR CHANGE: SHX5481

## 2012-10-29 HISTORY — PX: PACEMAKER GENERATOR CHANGE: SHX5998

## 2012-10-29 SURGERY — PACEMAKER GENERATOR CHANGE
Anesthesia: LOCAL

## 2012-10-29 MED ORDER — MUPIROCIN 2 % EX OINT
TOPICAL_OINTMENT | Freq: Two times a day (BID) | CUTANEOUS | Status: DC
  Administered 2012-10-29: 1 via NASAL

## 2012-10-29 MED ORDER — LIDOCAINE HCL (PF) 1 % IJ SOLN
INTRAMUSCULAR | Status: AC
  Filled 2012-10-29: qty 60

## 2012-10-29 MED ORDER — ACETAMINOPHEN 325 MG PO TABS: 325.0000 mg | ORAL_TABLET | ORAL | Status: DC | PRN

## 2012-10-29 MED ORDER — SODIUM CHLORIDE 0.9 % IJ SOLN: 3.0000 mL | INTRAMUSCULAR | Status: DC | PRN

## 2012-10-29 MED ORDER — HYDROCODONE-ACETAMINOPHEN 5-325 MG PO TABS: 1.0000 | ORAL_TABLET | ORAL | Status: DC | PRN

## 2012-10-29 MED ORDER — SODIUM CHLORIDE 0.9 % IJ SOLN: 3.0000 mL | Freq: Two times a day (BID) | INTRAMUSCULAR | Status: DC

## 2012-10-29 MED ORDER — ONDANSETRON HCL 4 MG/2ML IJ SOLN: 4.0000 mg | Freq: Four times a day (QID) | INTRAMUSCULAR | Status: DC | PRN

## 2012-10-29 MED ORDER — MUPIROCIN 2 % EX OINT
TOPICAL_OINTMENT | CUTANEOUS | Status: AC
  Filled 2012-10-29: qty 22

## 2012-10-29 MED ORDER — SODIUM CHLORIDE 0.9 % IV SOLN: 250.0000 mL | INTRAVENOUS | Status: DC | PRN

## 2012-10-29 NOTE — Op Note (Signed)
SURGEON:  Hillis Range, MD     PREPROCEDURE DIAGNOSES:   1. Sick sinus syndrome.   2. Symptomatic bradycardia    POSTPROCEDURE DIAGNOSES:   1. Sick sinus syndrome.   2. Symptomatic bradycardia    PROCEDURES:   1. Pacemaker pulse generator replacement.   2. Skin pocket revision.     INTRODUCTION:  Caleb Nash is a 79 y.o. male with a history of sick sinus syndrome and symptomatic bradycardia. He has done well since his pacemaker was implanted.  He has recently reached ERI battery status.  He presents today for pacemaker pulse generator replacement.       DESCRIPTION OF THE PROCEDURE:  Informed written consent was obtained, and the patient was brought to the electrophysiology lab in the fasting state.  The patient's pacemaker was interrogated today and found to be at elective replacement indicator battery status.  The patient required no sedation for the procedure today.  The patient's right chest was prepped and draped in the usual sterile fashion by the EP lab staff.  The skin overlying the existing pacemaker was infiltrated with lidocaine for local analgesia.  A 4-cm incision was made over the pacemaker pocket.  Using a combination of sharp and blunt dissection, the pacemaker was exposed and removed from the body.  The device was disconnected from the leads.  There was no foreign matter or debris within the pocket.  The atrial lead was confirmed to be a Medtronic model [id number] (serial number [id number]) lead implanted on  09/13/03.  The right ventricular lead was confirmed to be a Medtronic model [id number] (serial number [id number]) lead implanted on the same date as the atrial lead (above).  Both leads were examined and their integrity was confirmed to be intact.  Atrial lead P-waves measured 5.7 mV with impedance of 442 ohms and a threshold of 0.4 V at 0.5 msec.  Right ventricular lead R-waves measured 8.4 mV with impedance of 542 ohms and a threshold of 0.8 V at 0.5 msec.  Both leads were  connected to a Medtronic [place] model SEDR0 1 (serial number [id number]) pacemaker.  The pocket was revised to accommodate this new device.  Electrocautery was required to assure hemostasis.  The pocket was irrigated with copious gentamicin solution. The pacemaker was then placed into the pocket.  The pocket was then closed in 2 layers with 2-0 Vicryl suture over the subcutaneous and subcuticular layers.  Steri-Strips and a sterile dressing were then applied.  There were no early apparent complications.     CONCLUSIONS:   1. Successful pacemaker pulse generator replacement for elective replacement indicator battery status and symptomatic bradycardia  2. No early apparent complications.     Hillis Range, MD 10/29/2012 4:09 PM

## 2012-10-29 NOTE — H&P (View-Only) (Signed)
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  The patient reports doing very well since having a pacemaker implanted and remains very active despite his age.  He fell 12/13 and this has led to him moving to assisted living.  He recently reached ERI battery status.  Since that time, he has developed fatigue and SOB.  Today, he  denies symptoms of palpitations, chest pain, shortness of breath, orthopnea, PND, lower extremity edema, dizziness, presyncope, syncope, or neurologic sequela.  The patientis tolerating medications without difficulties and is otherwise without complaint today.   Past Medical History  Diagnosis Date  . Osteoarthritis   . HTN (hypertension)   . BPH (benign prostatic hyperplasia)   . Sick sinus syndrome 10/14/2003    MDT EnPulse implanted by Dr Amil Amen for SSS  . CAD (coronary artery disease)     s/p CABG 2005  . Macular degeneration   . Hyperlipidemia   . Depression     mild per pt  . Sporotrichosis     remote   Past Surgical History  Procedure Laterality Date  . Pacemaker insertion  10/14/2003    MDT Implanted by Dr Amil Amen for SSS  . Coronary artery bypass graft    . Bilateral knee arthroplasty      History   Social History  . Marital Status: Single    Spouse Name: N/A    Number of Children: N/A  . Years of Education: N/A   Occupational History  . Not on file.   Social History Main Topics  . Smoking status: Never Smoker   . Smokeless tobacco: Not on file  . Alcohol Use: No  . Drug Use: No  . Sexually Active: Not on file   Other Topics Concern  . Not on file   Social History Narrative   Pt lives in assisted living at [facility] following a fall 12/13.   Retired IT trainer.    Family History  Problem Relation Age of Onset  . Cancer      Allergies  Allergen  Reactions  . Celebrex (Celecoxib) Other (See Comments)    unknown  . Sulfur Other (See Comments)    unknown  . Vioxx (Rofecoxib) Other (See Comments)    unknown    Current Outpatient Prescriptions  Medication Sig Dispense Refill  . aspirin 81 MG tablet Take 81 mg by mouth daily.      . bimatoprost (LUMIGAN) 0.01 % SOLN Apply 1 drop to eye at bedtime.      . brimonidine-timolol (COMBIGAN) 0.2-0.5 % ophthalmic solution Place 1 drop into both eyes every 12 (twelve) hours.      . furosemide (LASIX) 40 MG tablet Take 1 tablet (40 mg total) by mouth daily.  30 tablet  0  . lisinopril (PRINIVIL,ZESTRIL) 20 MG tablet Take 20 mg by mouth 2 (two) times daily.      Marland Kitchen loratadine (CLARITIN) 10 MG tablet Take 10 mg by mouth daily.      . nitroGLYCERIN (NITROSTAT) 0.4 MG SL tablet Place 0.4 mg under the tongue every 5 (five) minutes as needed. For chest pain      . omeprazole (PRILOSEC) 20 MG capsule Take 20 mg by mouth daily.      . polyethylene glycol (MIRALAX / GLYCOLAX) packet Take 17 g by mouth daily  as needed. For constipation      . sertraline (ZOLOFT) 50 MG tablet Take 50 mg by mouth daily.      . Tamsulosin HCl (FLOMAX) 0.4 MG CAPS Take 0.4 mg by mouth daily.      . traMADol (ULTRAM) 50 MG tablet Take 100 mg by mouth every 4 (four) hours as needed. For pain      . zolpidem (AMBIEN) 5 MG tablet Take 1 tablet (5 mg total) by mouth at bedtime.  30 tablet  5   No current facility-administered medications for this visit.    ROS- all systems are reviewed and negative except as per HPI  Physical Exam: Filed Vitals:   10/17/12 1035  BP: 124/46  Pulse: 64  Height: 5\' 8"  (1.727 m)  Weight: 171 lb (77.565 kg)    GEN- The patient is well appearing, alert and oriented x 3 today.   Head- normocephalic, atraumatic Eyes-  Sclera clear, conjunctiva pink Ears- hearing intact Oropharynx- clear Neck- supple, no JVP Lymph- no cervical lymphadenopathy Lungs- Clear to ausculation bilaterally, normal  work of breathing Chest- R sided pacemaker pocket is well healed Heart- Regular rate and rhythm,  GI- soft, NT, ND, + BS Extremities- no clubbing, cyanosis, or edema MS- age appropriate muscle atrophy, walks with a cane Skin- no rash or lesion Psych- euthymic mood, full affect, very pleasant Neuro- strength and sensation are intact  Pacemaker interrogation- reviewed in detail today,  See PACEART report  Assessment and Plan:  1. Sick sinus syndrome The patients pacemaker is at [facility].  He has developed symptoms with VVI reversion.  I would recommend pacemaker generator change.  Risks, benefits, and alternatives to this procedure were discussed with the patient who understands and wishes to proceed.  We will schedule the procedure at the next available time.  2. CAD Stable No change required today  3. HTN Stable No change required today

## 2012-10-29 NOTE — Interval H&P Note (Signed)
History and Physical Interval Note:  10/29/2012 3:01 PM  Caleb Nash  has presented today for surgery, with the diagnosis of end of life  The various methods of treatment have been discussed with the patient and family. After consideration of risks, benefits and other options for treatment, the patient has consented to  Procedure(s): PACEMAKER GENERATOR CHANGE (N/A) as a surgical intervention .  The patient's history has been reviewed, patient examined, no change in status, stable for surgery.  I have reviewed the patient's chart and labs.  Questions were answered to the patient's satisfaction.     Hillis Range

## 2012-10-31 ENCOUNTER — Encounter (HOSPITAL_COMMUNITY): Payer: Self-pay | Admitting: *Deleted

## 2012-11-01 ENCOUNTER — Encounter (HOSPITAL_COMMUNITY): Payer: Self-pay | Admitting: *Deleted

## 2012-11-07 ENCOUNTER — Ambulatory Visit (INDEPENDENT_AMBULATORY_CARE_PROVIDER_SITE_OTHER): Payer: Medicare Other | Admitting: *Deleted

## 2012-11-07 DIAGNOSIS — I495 Sick sinus syndrome: Secondary | ICD-10-CM

## 2012-11-07 DIAGNOSIS — Z950 Presence of cardiac pacemaker: Secondary | ICD-10-CM

## 2012-11-07 DIAGNOSIS — I471 Supraventricular tachycardia: Secondary | ICD-10-CM

## 2012-11-07 LAB — PACEMAKER DEVICE OBSERVATION
AL IMPEDENCE PM: 459 Ohm
AL THRESHOLD: 0.5 V
ATRIAL PACING PM: 98
BATTERY VOLTAGE: 2.79 V
RV LEAD IMPEDENCE PM: 612 Ohm
VENTRICULAR PACING PM: 100

## 2012-11-07 NOTE — Progress Notes (Signed)
Pt seen in device clinic for follow up of recently replaced pacemaker.  Steri-strips removed today. Wound well healed.  No redness, swelling, or edema.   Device interrogated and found to be functioning normally.  No changes made today. See PaceArt for full details.  Pt denies chest pain, shortness of breath, palpitations, or dizziness.  Pt to follow up with Dr. Johney Frame in 3 months.   Angel Weedon 11/07/2012 10:50 AM

## 2012-11-15 ENCOUNTER — Encounter: Payer: Self-pay | Admitting: Nurse Practitioner

## 2012-11-15 ENCOUNTER — Non-Acute Institutional Stay: Payer: Medicare Other | Admitting: Nurse Practitioner

## 2012-11-15 DIAGNOSIS — N400 Enlarged prostate without lower urinary tract symptoms: Secondary | ICD-10-CM

## 2012-11-15 DIAGNOSIS — F329 Major depressive disorder, single episode, unspecified: Secondary | ICD-10-CM | POA: Diagnosis not present

## 2012-11-15 DIAGNOSIS — I10 Essential (primary) hypertension: Secondary | ICD-10-CM

## 2012-11-15 DIAGNOSIS — G4700 Insomnia, unspecified: Secondary | ICD-10-CM

## 2012-11-15 DIAGNOSIS — I5040 Unspecified combined systolic (congestive) and diastolic (congestive) heart failure: Secondary | ICD-10-CM | POA: Diagnosis not present

## 2012-11-15 DIAGNOSIS — R05 Cough: Secondary | ICD-10-CM

## 2012-11-15 HISTORY — DX: Insomnia, unspecified: G47.00

## 2012-11-15 NOTE — Assessment & Plan Note (Addendum)
Stable on Tamsulosin 0.4mg  with nocturnal urinary frequency 4x/night. Add Finasteride 5mg  daily

## 2012-11-15 NOTE — Assessment & Plan Note (Signed)
Controlled on Lisinopril 20mg 

## 2012-11-15 NOTE — Progress Notes (Signed)
Patient ID: Caleb Nash, male   DOB: [birth year], 79 y.o.   MRN: [id number] Code Status: DNR  Allergies  Allergen Reactions  . Celebrex (Celecoxib) Other (See Comments)    unknown  . Sulfur Other (See Comments)    unknown  . Vioxx (Rofecoxib) Other (See Comments)    unknown    Chief Complaint  Patient presents with  . Medical Managment of Chronic Issues    cough and phlegm, nocturnal urinary frequency 4x/night, toe pain    HPI: Patient is a 79 y.o. male seen in the AL at [facility] today for Cough, clear mucous, Urinary frequency, toe pain(previous injury--Tramadol nightly helps), and other chronic medical conditions.  Problem List Items Addressed This Visit   BPH (benign prostatic hyperplasia)     Stable on Tamsulosin 0.4mg  with nocturnal urinary frequency 4x/night. Add Finasteride 5mg  daily      CHF (congestive heart failure) - Primary     Compensated on Furosemide 40mg , the patient c/o urinary frequency at night, no apparent edema presently, will try to decrease Furosemide to 20mg  daily, BMP/BNP in 2weeks, weight 2x/week.       Cough     With clear mucous-chronic, Claritin doesn't help much, will dc it, try Mucinex 600mg  bid for 10 days.      Depression (Chronic)     Stable on Sertraline 50mg .       Essential hypertension     Controlled on Lisinopril 20mg .       Insomnia     Takes Ambien 5mg  nightly, interrupted at night with frequent bathroom trips, may be able to d/c it if nocturnal urinary frequency is better managed.        Review of Systems:  Review of Systems  Constitutional: Negative for fever, chills, weight loss and malaise/fatigue.  HENT: Positive for hearing loss. Negative for congestion, sore throat, neck pain and ear discharge.   Eyes: Negative for blurred vision, double vision and photophobia.       Macular degeneration OU--f/u Ophthalmology, 2 eye drops. Had inj >20x in the past per patient.   Respiratory: Positive for cough and sputum  production (clear mucous). Negative for shortness of breath and wheezing.   Cardiovascular: Negative for chest pain, palpitations, orthopnea, claudication, leg swelling and PND.  Gastrointestinal: Negative for heartburn, nausea, vomiting, abdominal pain, diarrhea, constipation and blood in stool.  Genitourinary: Positive for frequency. Negative for dysuria, urgency, hematuria and flank pain.  Musculoskeletal: Positive for back pain. Negative for myalgias and falls. Joint pain: left shoudler and toes pain-managed with Tramadol.  Skin: Negative for itching and rash.  Neurological: Negative for dizziness, tingling, tremors, sensory change, speech change, focal weakness, seizures, loss of consciousness, weakness and headaches.  Endo/Heme/Allergies: Negative for environmental allergies and polydipsia. Does not bruise/bleed easily.  Psychiatric/Behavioral: Negative for depression, hallucinations, memory loss and substance abuse. The patient is not nervous/anxious and does not have insomnia.      Past Medical History  Diagnosis Date  . Osteoarthritis   . HTN (hypertension)   . BPH (benign prostatic hyperplasia)   . Sick sinus syndrome 10/14/2003; 10/29/2012    MDT EnPulse implanted by Dr Amil Amen for SSS; generator change 10/29/2012 by Dr Johney Frame MDT [employer] pacemaker  . CAD (coronary artery disease)     s/p CABG 2005  . Macular degeneration   . Hyperlipidemia   . Depression     mild per pt  . Sporotrichosis     remote   Past Surgical History  Procedure  Laterality Date  . Pacemaker insertion  10/14/2003;10/29/2012    MDT Implanted by Dr Amil Amen for SSS; generator change 10/29/2012 by Dr Johney Frame [employer] [place]  . Coronary artery bypass graft    . Bilateral knee arthroplasty     Social History:   reports that he has never smoked. He does not have any smokeless tobacco history on file. He reports that he does not drink alcohol or use illicit drugs.  Family History  Problem Relation Age of Onset  .  Cancer      Medications: Reviewed at [facility]   Physical Exam: Physical Exam  Constitutional: He is oriented to person, place, and time. He appears well-developed and well-nourished. No distress.  HENT:  Head: Normocephalic and atraumatic.  Right Ear: External ear normal.  Left Ear: External ear normal.  Mouth/Throat: No oropharyngeal exudate.  Eyes: Conjunctivae and EOM are normal. Pupils are equal, round, and reactive to light. No scleral icterus.  Neck: Normal range of motion. Neck supple. No JVD present. No tracheal deviation present. No thyromegaly present.  Cardiovascular: Normal rate, regular rhythm and normal heart sounds.   No murmur heard. Pulmonary/Chest: Effort normal. No respiratory distress. He has decreased breath sounds. He has no wheezes. He has rales in the right lower field and the left lower field.  Abdominal: He exhibits no distension. There is no tenderness. There is no rebound.  Musculoskeletal: He exhibits tenderness. He exhibits no edema.  Slightly decreased ROM of the left shoulder with pain. Toes pain.   Lymphadenopathy:    He has no cervical adenopathy.  Neurological: He is alert and oriented to person, place, and time. He has normal reflexes. He displays normal reflexes. No cranial nerve deficit. He exhibits normal muscle tone. Coordination normal.  Skin: Skin is warm and dry. No rash noted. He is not diaphoretic. No erythema.  Psychiatric: He has a normal mood and affect. His behavior is normal. Judgment and thought content normal.    Filed Vitals:   11/15/12 1536  BP: 140/60  Pulse: 60  Temp: 97.8 F (36.6 C)  TempSrc: Tympanic  Resp: 18      Labs reviewed: Basic Metabolic Panel:  Recent Labs  16/10/96 1550 04/22/12 0004  04/23/12 0557 04/24/12 0645 04/25/12 0435 10/23/12  NA 135  --   < > 139 143 146* 137  K 4.7  --   < > 3.5 3.4* 4.1 4.7  CL 93*  --   < > 108 110 109  --   CO2 14*  --   < > 19 19 24   --   GLUCOSE 154*  --   < > 158*  166* 143*  --   BUN 42*  --   < > 26* 28* 31* 42*  CREATININE 1.14  --   < > 0.78 0.82 1.01 1.4*  CALCIUM 9.9  --   < > 8.7 8.9 9.1  --   MG  --  1.9  --   --   --   --   --   PHOS  --  4.3  --   --   --   --   --   < > = values in this interval not displayed. Liver Function Tests:  Recent Labs  04/21/12 1550  AST 138*  ALT 41  ALKPHOS 93  BILITOT 0.7  PROT 7.5  ALBUMIN 3.4*    CBC:  Recent Labs  04/21/12 1550  04/23/12 0557 04/24/12 0645 04/25/12 0435 10/23/12  WBC 17.3*  < >  12.5* 18.1* 16.7* 6.9  NEUTROABS 15.1*  --   --   --   --   --   HGB 13.4  < > 11.5* 13.1 12.7* 12.9*  HCT 39.9  < > 33.1* 38.3* 38.0* 36*  MCV 98.3  < > 97.4 98.5 100.3*  --   PLT 239  < > 235 252 265 157  < > = values in this interval not displayed. Lipid Panel:  Recent Labs  04/22/12 0004  CHOL 149  HDL 35*  LDLCALC 96  TRIG 89  CHOLHDL 4.3     Past Procedures:  09/13/12 X-ray L shoulder: diastasis left acromioclavicular joint possibly from ligamentous injury. Previous surgical changes distal clavicle may also have occurred.    Assessment/Plan CHF (congestive heart failure) Compensated on Furosemide 40mg , the patient c/o urinary frequency at night, no apparent edema presently, will try to decrease Furosemide to 20mg  daily, BMP/BNP in 2weeks, weight 2x/week.     BPH (benign prostatic hyperplasia) Stable on Tamsulosin 0.4mg  with nocturnal urinary frequency 4x/night. Add Finasteride 5mg  daily    Depression Stable on Sertraline 50mg .     Essential hypertension Controlled on Lisinopril 20mg .     Insomnia Takes Ambien 5mg  nightly, interrupted at night with frequent bathroom trips, may be able to d/c it if nocturnal urinary frequency is better managed.   Cough With clear mucous-chronic, Claritin doesn't help much, will dc it, try Mucinex 600mg  bid for 10 days.      Family/ Staff Communication: observe the patient  Goals of Care:AL  Labs/tests ordered: BMP, BNP,  TSH, weight 2x/week

## 2012-11-15 NOTE — Assessment & Plan Note (Signed)
Stable on Sertraline 50mg    

## 2012-11-15 NOTE — Assessment & Plan Note (Signed)
Takes Ambien 5mg nightly, interrupted at night with frequent bathroom trips, may be able to d/c it if nocturnal urinary frequency is better managed.    

## 2012-11-15 NOTE — Assessment & Plan Note (Signed)
With clear mucous-chronic, Claritin doesn't help much, will dc it, try Mucinex 600mg  bid for 10 days.

## 2012-11-15 NOTE — Assessment & Plan Note (Signed)
Compensated on Furosemide 40mg , the patient c/o urinary frequency at night, no apparent edema presently, will try to decrease Furosemide to 20mg  daily, BMP/BNP in 2weeks, weight 2x/week.

## 2012-11-22 ENCOUNTER — Other Ambulatory Visit: Payer: Self-pay | Admitting: Geriatric Medicine

## 2012-11-22 MED ORDER — ZOLPIDEM TARTRATE 5 MG PO TABS: 5.0000 mg | ORAL_TABLET | Freq: Every day | ORAL | Status: DC

## 2012-11-27 DIAGNOSIS — I2510 Atherosclerotic heart disease of native coronary artery without angina pectoris: Secondary | ICD-10-CM | POA: Diagnosis not present

## 2012-11-27 DIAGNOSIS — I10 Essential (primary) hypertension: Secondary | ICD-10-CM | POA: Diagnosis not present

## 2012-11-27 DIAGNOSIS — Z950 Presence of cardiac pacemaker: Secondary | ICD-10-CM | POA: Diagnosis not present

## 2012-11-27 DIAGNOSIS — H35059 Retinal neovascularization, unspecified, unspecified eye: Secondary | ICD-10-CM | POA: Diagnosis not present

## 2012-11-27 DIAGNOSIS — I4891 Unspecified atrial fibrillation: Secondary | ICD-10-CM | POA: Diagnosis not present

## 2012-11-27 DIAGNOSIS — H353290 Exudative age-related macular degeneration, unspecified eye, stage unspecified: Secondary | ICD-10-CM | POA: Diagnosis not present

## 2012-11-28 DIAGNOSIS — E039 Hypothyroidism, unspecified: Secondary | ICD-10-CM | POA: Diagnosis not present

## 2012-11-28 DIAGNOSIS — I10 Essential (primary) hypertension: Secondary | ICD-10-CM | POA: Diagnosis not present

## 2012-11-28 DIAGNOSIS — R0602 Shortness of breath: Secondary | ICD-10-CM | POA: Diagnosis not present

## 2012-11-28 LAB — BASIC METABOLIC PANEL
BUN: 31 mg/dL — AB (ref 4–21)
Sodium: 136 mmol/L — AB (ref 137–147)

## 2012-11-28 LAB — TSH: TSH: 3.09 u[IU]/mL (ref 0.41–5.90)

## 2012-11-29 ENCOUNTER — Encounter: Payer: Self-pay | Admitting: Internal Medicine

## 2012-11-29 ENCOUNTER — Encounter: Payer: Self-pay | Admitting: *Deleted

## 2012-12-03 ENCOUNTER — Non-Acute Institutional Stay: Payer: Medicare Other | Admitting: Nurse Practitioner

## 2012-12-03 ENCOUNTER — Encounter: Payer: Self-pay | Admitting: Nurse Practitioner

## 2012-12-03 DIAGNOSIS — F32A Depression, unspecified: Secondary | ICD-10-CM

## 2012-12-03 DIAGNOSIS — M79676 Pain in unspecified toe(s): Secondary | ICD-10-CM | POA: Insufficient documentation

## 2012-12-03 DIAGNOSIS — R05 Cough: Secondary | ICD-10-CM | POA: Diagnosis not present

## 2012-12-03 DIAGNOSIS — Z950 Presence of cardiac pacemaker: Secondary | ICD-10-CM | POA: Diagnosis not present

## 2012-12-03 DIAGNOSIS — F329 Major depressive disorder, single episode, unspecified: Secondary | ICD-10-CM

## 2012-12-03 DIAGNOSIS — I5040 Unspecified combined systolic (congestive) and diastolic (congestive) heart failure: Secondary | ICD-10-CM | POA: Diagnosis not present

## 2012-12-03 DIAGNOSIS — L219 Seborrheic dermatitis, unspecified: Secondary | ICD-10-CM

## 2012-12-03 DIAGNOSIS — R059 Cough, unspecified: Secondary | ICD-10-CM

## 2012-12-03 DIAGNOSIS — G4700 Insomnia, unspecified: Secondary | ICD-10-CM

## 2012-12-03 DIAGNOSIS — L218 Other seborrheic dermatitis: Secondary | ICD-10-CM | POA: Diagnosis not present

## 2012-12-03 DIAGNOSIS — I10 Essential (primary) hypertension: Secondary | ICD-10-CM

## 2012-12-03 DIAGNOSIS — M79609 Pain in unspecified limb: Secondary | ICD-10-CM

## 2012-12-03 DIAGNOSIS — K219 Gastro-esophageal reflux disease without esophagitis: Secondary | ICD-10-CM

## 2012-12-03 DIAGNOSIS — K5900 Constipation, unspecified: Secondary | ICD-10-CM

## 2012-12-03 DIAGNOSIS — N400 Enlarged prostate without lower urinary tract symptoms: Secondary | ICD-10-CM

## 2012-12-03 HISTORY — DX: Seborrheic dermatitis, unspecified: L21.9

## 2012-12-03 NOTE — Assessment & Plan Note (Signed)
Compensated on Furosemide 40mg, the patient c/o urinary frequency at night, no apparent edema presently, subsequently 11/15/12 Furosemide was decreased  to 20mg daily, BMP unremarkable/BNP 181.3 11/28/12,  weight 2x/week. #3Ibs-prn Lasix 20mg x2/week available to him.        

## 2012-12-03 NOTE — Assessment & Plan Note (Signed)
With clear mucous-chronic, Claritin doesn't help much, off it now. Continue  Mucinex 600mg  bid since it works better.
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
## 2012-12-03 NOTE — Assessment & Plan Note (Signed)
Stable on Omeprazole.  

## 2012-12-03 NOTE — Assessment & Plan Note (Signed)
Stable on MiraLax qod                 

## 2012-12-03 NOTE — Assessment & Plan Note (Signed)
Controlled on Lisinopril 20mg 

## 2012-12-03 NOTE — Assessment & Plan Note (Signed)
The patient has concerns of the device not working appropriately--will request Dr.Allred's office to review the hx of his pacer since he c/o "skips"

## 2012-12-03 NOTE — Assessment & Plan Note (Signed)
Takes Ambien 5mg  nightly, interrupted at night with frequent bathroom trips, may be able to d/c it if nocturnal urinary frequency is better managed.

## 2012-12-03 NOTE — Assessment & Plan Note (Signed)
Under the eyes-itching and scaly areas-will apply Mycolog II bid for now then prn.

## 2012-12-03 NOTE — Assessment & Plan Note (Signed)
Tamsulosin 0.4mg  with nocturnal urinary frequency 4x/night. Added Finasteride 5mg  daily didn't help so far.

## 2012-12-03 NOTE — Assessment & Plan Note (Signed)
Stable on Sertraline 50mg    
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
## 2012-12-16 ENCOUNTER — Other Ambulatory Visit: Payer: Self-pay | Admitting: *Deleted

## 2012-12-16 DIAGNOSIS — H353290 Exudative age-related macular degeneration, unspecified eye, stage unspecified: Secondary | ICD-10-CM | POA: Diagnosis not present

## 2012-12-16 DIAGNOSIS — H35359 Cystoid macular degeneration, unspecified eye: Secondary | ICD-10-CM | POA: Diagnosis not present

## 2012-12-16 DIAGNOSIS — H353190 Nonexudative age-related macular degeneration, unspecified eye, stage unspecified: Secondary | ICD-10-CM | POA: Diagnosis not present

## 2012-12-16 MED ORDER — TRAMADOL HCL 50 MG PO TABS: ORAL_TABLET | ORAL | Status: DC

## 2012-12-17 ENCOUNTER — Other Ambulatory Visit: Payer: Self-pay | Admitting: *Deleted

## 2012-12-17 MED ORDER — TRAMADOL HCL 50 MG PO TABS: ORAL_TABLET | ORAL | Status: DC

## 2013-01-08 DIAGNOSIS — H35059 Retinal neovascularization, unspecified, unspecified eye: Secondary | ICD-10-CM | POA: Diagnosis not present

## 2013-01-08 DIAGNOSIS — H353290 Exudative age-related macular degeneration, unspecified eye, stage unspecified: Secondary | ICD-10-CM | POA: Diagnosis not present

## 2013-01-10 ENCOUNTER — Non-Acute Institutional Stay: Payer: Medicare Other | Admitting: Nurse Practitioner

## 2013-01-10 DIAGNOSIS — I10 Essential (primary) hypertension: Secondary | ICD-10-CM

## 2013-01-10 DIAGNOSIS — G4700 Insomnia, unspecified: Secondary | ICD-10-CM

## 2013-01-10 DIAGNOSIS — M1990 Unspecified osteoarthritis, unspecified site: Secondary | ICD-10-CM

## 2013-01-10 DIAGNOSIS — Z950 Presence of cardiac pacemaker: Secondary | ICD-10-CM

## 2013-01-10 DIAGNOSIS — R05 Cough: Secondary | ICD-10-CM | POA: Diagnosis not present

## 2013-01-10 DIAGNOSIS — N400 Enlarged prostate without lower urinary tract symptoms: Secondary | ICD-10-CM

## 2013-01-10 DIAGNOSIS — K5900 Constipation, unspecified: Secondary | ICD-10-CM

## 2013-01-10 DIAGNOSIS — I509 Heart failure, unspecified: Secondary | ICD-10-CM

## 2013-01-10 DIAGNOSIS — F329 Major depressive disorder, single episode, unspecified: Secondary | ICD-10-CM

## 2013-01-10 DIAGNOSIS — K219 Gastro-esophageal reflux disease without esophagitis: Secondary | ICD-10-CM

## 2013-01-16 ENCOUNTER — Other Ambulatory Visit: Payer: Self-pay | Admitting: *Deleted

## 2013-01-16 MED ORDER — AMBULATORY NON FORMULARY MEDICATION: Status: DC

## 2013-01-17 ENCOUNTER — Encounter: Payer: Self-pay | Admitting: Nurse Practitioner

## 2013-01-17 NOTE — Progress Notes (Signed)
Patient ID: Caleb Nash, male   DOB: 1913/07/23, 79 y.o.   MRN: [id number] Code Status: DNR  Allergies  Allergen Reactions  . Celebrex [Celecoxib] Other (See Comments)    unknown  . Sulfur Other (See Comments)    unknown  . Vioxx [Rofecoxib] Other (See Comments)    unknown    Chief Complaint  Patient presents with  . Medical Managment of Chronic Issues    pain and insomnia.    HPI: Patient is a 79 y.o. male seen in the AL at [facility] today for evaluation of insomnia, depression, arthritic pain, and other chronic medical conditions.   Problem List Items Addressed This Visit   BPH (benign prostatic hyperplasia)     Tamsulosin 0.4mg  with nocturnal urinary frequency 4x/night. Added Finasteride 5mg  daily helped to reduced urinary frequency.          CHF (congestive heart failure)     Compensated on Furosemide 40mg , the patient c/o urinary frequency at night, no apparent edema presently, subsequently 11/15/12 Furosemide was decreased  to 20mg  daily, BMP unremarkable/BNP 181.3 11/28/12,  weight 2x/week. #3Ibs-prn Lasix 20mg  x2/week available to him.           Cough     With clear mucous-chronic, Claritin doesn't help much, off it now. Continue  Mucinex 600mg  bid since it works better.           Degenerative joint disease (Chronic)     C/o left shoulder pain down to elbow, having discomfort during restorative exercising, thinks he may have injured his shoulder during last fall in apartment. Chronic multiple joints arthritis-s/p R+L knee replacements, s/p right shoulder rotator cuff repair, lumbar spine injx2 with little efficacy-then instructed to apply Voltaren gel with little improvement. R lower back pain on and off--not interfering his ADLs. Left shoulder pain with ROM became problematic for a while then subsided. He desires to increase Tramadol to bid for better pain control.      Relevant Medications      traMADol (ULTRAM) 50 MG tablet   Depression - Primary  (Chronic)     Still unable to sleep/rest well at night, desires to increased Sertraline 75mg  qhs instead of 50mg  in am. Will observe the patient.           Essential hypertension     Controlled on Lisinopril 20mg .           GERD (gastroesophageal reflux disease)     Stable on Omeprazole.         Insomnia     Takes Ambien 5mg  nightly, interrupted at night with frequent bathroom trips-better now, but still c/o not sleep/rest well at night-will try to increase Sertraline to 75mg  at night instead of 50mg  in am.         Pacemaker (Chronic)     The patient has concerns of the device not working appropriately--will request Dr.Allred's office to review the hx of his pacer since he c/o "skips". New generator 2014.      Unspecified constipation     Stable on MiraLax qod         Review of Systems: Review of Systems  Constitutional: Negative for fever, chills, weight loss and malaise/fatigue.  HENT: Positive for hearing loss. Negative for congestion, sore throat, neck pain and ear discharge.   Eyes: Negative for blurred vision, double vision and photophobia.       Macular degeneration OU--f/u Ophthalmology, 2 eye drops. Had inj >20x in the past per patient.  Respiratory: Positive for cough and sputum production (clear mucous). Negative for shortness of breath and wheezing.   Cardiovascular: Positive for palpitations. Negative for chest pain, orthopnea, claudication, leg swelling and PND.  Gastrointestinal: Negative for heartburn, nausea, vomiting, abdominal pain, diarrhea, constipation and blood in stool.  Genitourinary: Positive for frequency. Negative for dysuria, urgency, hematuria and flank pain.  Musculoskeletal: Positive for back pain. Negative for myalgias and falls. Joint pain: left shoudler and toes pain-managed with Tramadol.  Skin: Negative for itching and rash.  Neurological: Negative for dizziness, tingling, tremors, sensory change, speech change, focal  weakness, seizures, loss of consciousness, weakness and headaches.  Endo/Heme/Allergies: Negative for environmental allergies and polydipsia. Does not bruise/bleed easily.  Psychiatric/Behavioral: Positive for depression. Negative for hallucinations, memory loss and substance abuse. The patient has insomnia. The patient is not nervous/anxious.      Past Medical History  Diagnosis Date  . Osteoarthritis   . HTN (hypertension)   . BPH (benign prostatic hyperplasia)   . Sick sinus syndrome 10/14/2003; 10/29/2012    MDT EnPulse implanted by Dr Amil Amen for SSS; generator change 10/29/2012 by Dr Johney Frame MDT Jana Half pacemaker  . CAD (coronary artery disease)     s/p CABG 2005  . Macular degeneration   . Hyperlipidemia   . Depression     mild per pt  . Sporotrichosis     remote  . History of rhabdomyolysis    Past Surgical History  Procedure Laterality Date  . Pacemaker insertion  10/14/2003;10/29/2012    MDT Implanted by Dr Amil Amen for SSS; generator change 10/29/2012 by Dr Johney Frame [employer] [place]  . Coronary artery bypass graft    . Bilateral knee arthroplasty     Social History:   reports that he has never smoked. He does not have any smokeless tobacco history on file. He reports that he does not drink alcohol or use illicit drugs.  Family History  Problem Relation Age of Onset  . Cancer      Medications: Patient's Medications  New Prescriptions   AMBULATORY NON FORMULARY MEDICATION    Tramadol 100mg  Sig: Take one tablet by mouth every morning and at bedtime for pain  Previous Medications   AMOXICILLIN (AMOXIL) 500 MG CAPSULE    Take 2,000 mg by mouth daily as needed (prior to surgery).   ASPIRIN 81 MG TABLET    Take 81 mg by mouth daily.   BIMATOPROST (LUMIGAN) 0.01 % SOLN    Place 1 drop into both eyes at bedtime.    BRIMONIDINE-TIMOLOL (COMBIGAN) 0.2-0.5 % OPHTHALMIC SOLUTION    Place 1 drop into both eyes every 12 (twelve) hours.   FINASTERIDE (PROSCAR) 5 MG TABLET    Take one  tablet by mouth once daily   FOLIC ACID (FOLVITE) 1 MG TABLET    Take 1 mg by mouth daily.   FUROSEMIDE (LASIX) 20 MG TABLET    Take one tablet by mouth once daily   GUAIFENESIN (MUCINEX) 600 MG 12 HR TABLET    Take one tablet twice daily as needed for congestion   LISINOPRIL (PRINIVIL,ZESTRIL) 20 MG TABLET    Take 20 mg by mouth 2 (two) times daily.   MULTIPLE VITAMINS-MINERALS (ICAPS AREDS FORMULA PO)    Take 1 tablet by mouth daily.   NITROGLYCERIN (NITROSTAT) 0.4 MG SL TABLET    Place 0.4 mg under the tongue every 5 (five) minutes as needed. For chest pain   OMEPRAZOLE (PRILOSEC) 20 MG CAPSULE    Take 20 mg by mouth  daily.   POLYETHYLENE GLYCOL (MIRALAX / GLYCOLAX) PACKET    Take 17 g by mouth daily as needed. For constipation   SERTRALINE (ZOLOFT) 50 MG TABLET    Take 75 mg by mouth daily.    TAMSULOSIN HCL (FLOMAX) 0.4 MG CAPS    Take 0.4 mg by mouth daily.   TRAMADOL (ULTRAM) 50 MG TABLET    Take 100 mg by mouth 2 (two) times daily.   ZOLPIDEM (AMBIEN) 5 MG TABLET    Take 1 tablet (5 mg total) by mouth at bedtime.  Modified Medications   No medications on file  Discontinued Medications   TRAMADOL (ULTRAM) 50 MG TABLET    Take two tablets by mouth at bedtime     Physical Exam: Physical Exam  Constitutional: He is oriented to person, place, and time. He appears well-developed and well-nourished. No distress.  HENT:  Head: Normocephalic and atraumatic.  Right Ear: External ear normal.  Left Ear: External ear normal.  Mouth/Throat: No oropharyngeal exudate.  Eyes: Conjunctivae and EOM are normal. Pupils are equal, round, and reactive to light. No scleral icterus.  Neck: Normal range of motion. Neck supple. No JVD present. No tracheal deviation present. No thyromegaly present.  Cardiovascular: Normal rate, regular rhythm and normal heart sounds.   No murmur heard. Pulmonary/Chest: Effort normal. No respiratory distress. He has decreased breath sounds. He has no wheezes. He has rales  in the right lower field and the left lower field.  Abdominal: He exhibits no distension. There is no tenderness. There is no rebound.  Musculoskeletal: He exhibits tenderness. He exhibits no edema.  Slightly decreased ROM of the left shoulder with pain. Toes pain.   Lymphadenopathy:    He has no cervical adenopathy.  Neurological: He is alert and oriented to person, place, and time. He has normal reflexes. No cranial nerve deficit. He exhibits normal muscle tone. Coordination normal.  Skin: Skin is warm and dry. No rash noted. He is not diaphoretic. No erythema.  Psychiatric: He has a normal mood and affect. His behavior is normal. Judgment and thought content normal.    Filed Vitals:   01/17/13 1237  BP: 108/68  Pulse: 61  Temp: 97.4 F (36.3 C)  TempSrc: Tympanic  Resp: 18      Labs reviewed: Basic Metabolic Panel:  Recent Labs  45/40/98 1550 04/22/12 0004  04/23/12 0557 04/24/12 0645 04/25/12 0435 10/23/12 11/28/12  NA 135  --   < > 139 143 146* 137 136*  K 4.7  --   < > 3.5 3.4* 4.1 4.7 4.3  CL 93*  --   < > 108 110 109  --   --   CO2 14*  --   < > 19 19 24   --   --   GLUCOSE 154*  --   < > 158* 166* 143*  --   --   BUN 42*  --   < > 26* 28* 31* 42* 31*  CREATININE 1.14  --   < > 0.78 0.82 1.01 1.4* 1.4*  CALCIUM 9.9  --   < > 8.7 8.9 9.1  --   --   MG  --  1.9  --   --   --   --   --   --   PHOS  --  4.3  --   --   --   --   --   --   TSH  --   --   --   --   --   --   --  3.09  < > = values in this interval not displayed. Liver Function Tests:  Recent Labs  04/21/12 1550  AST 138*  ALT 41  ALKPHOS 93  BILITOT 0.7  PROT 7.5  ALBUMIN 3.4*    CBC:  Recent Labs  04/21/12 1550  04/23/12 0557 04/24/12 0645 04/25/12 0435 10/23/12  WBC 17.3*  < > 12.5* 18.1* 16.7* 6.9  NEUTROABS 15.1*  --   --   --   --   --   HGB 13.4  < > 11.5* 13.1 12.7* 12.9*  HCT 39.9  < > 33.1* 38.3* 38.0* 36*  MCV 98.3  < > 97.4 98.5 100.3*  --   PLT 239  < > 235 252 265  157  < > = values in this interval not displayed. Lipid Panel:  Recent Labs  04/22/12 0004  CHOL 149  HDL 35*  LDLCALC 96  TRIG 89  CHOLHDL 4.3       Assessment/Plan Depression Still unable to sleep/rest well at night, desires to increased Sertraline 75mg  qhs instead of 50mg  in am. Will observe the patient.         Degenerative joint disease C/o left shoulder pain down to elbow, having discomfort during restorative exercising, thinks he may have injured his shoulder during last fall in apartment. Chronic multiple joints arthritis-s/p R+L knee replacements, s/p right shoulder rotator cuff repair, lumbar spine injx2 with little efficacy-then instructed to apply Voltaren gel with little improvement. R lower back pain on and off--not interfering his ADLs. Left shoulder pain with ROM became problematic for a while then subsided. He desires to increase Tramadol to bid for better pain control.    Cough With clear mucous-chronic, Claritin doesn't help much, off it now. Continue  Mucinex 600mg  bid since it works better.         CHF (congestive heart failure) Compensated on Furosemide 40mg , the patient c/o urinary frequency at night, no apparent edema presently, subsequently 11/15/12 Furosemide was decreased  to 20mg  daily, BMP unremarkable/BNP 181.3 11/28/12,  weight 2x/week. #3Ibs-prn Lasix 20mg  x2/week available to him.         BPH (benign prostatic hyperplasia) Tamsulosin 0.4mg  with nocturnal urinary frequency 4x/night. Added Finasteride 5mg  daily helped to reduced urinary frequency.        Essential hypertension Controlled on Lisinopril 20mg .         GERD (gastroesophageal reflux disease) Stable on Omeprazole.       Insomnia Takes Ambien 5mg  nightly, interrupted at night with frequent bathroom trips-better now, but still c/o not sleep/rest well at night-will try to increase Sertraline to 75mg  at night instead of 50mg  in am.       Unspecified  constipation Stable on MiraLax qod    Pacemaker The patient has concerns of the device not working appropriately--will request Dr.Allred's office to review the hx of his pacer since he c/o "skips". New generator 2014.      Family/ Staff Communication: observe  Goals of Care: AL  Labs/tests ordered: none

## 2013-01-17 NOTE — Assessment & Plan Note (Signed)
Stable on Omeprazole.  

## 2013-01-17 NOTE — Assessment & Plan Note (Signed)
Compensated on Furosemide 40mg , the patient c/o urinary frequency at night, no apparent edema presently, subsequently 11/15/12 Furosemide was decreased  to 20mg  daily, BMP unremarkable/BNP 181.3 11/28/12,  weight 2x/week. #3Ibs-prn Lasix 20mg  x2/week available to him.

## 2013-01-17 NOTE — Assessment & Plan Note (Signed)
Controlled on Lisinopril 20mg 

## 2013-01-17 NOTE — Assessment & Plan Note (Signed)
Tamsulosin 0.4mg  with nocturnal urinary frequency 4x/night. Added Finasteride 5mg  daily helped to reduced urinary frequency.

## 2013-01-17 NOTE — Assessment & Plan Note (Signed)
The patient has concerns of the device not working appropriately--will request Dr.Allred's office to review the hx of his pacer since he c/o "skips". New generator 2014.

## 2013-01-17 NOTE — Assessment & Plan Note (Signed)
Takes Ambien 5mg  nightly, interrupted at night with frequent bathroom trips-better now, but still c/o not sleep/rest well at night-will try to increase Sertraline to 75mg  at night instead of 50mg  in am.

## 2013-01-17 NOTE — Assessment & Plan Note (Signed)
Stable on MiraLax qod                 

## 2013-01-17 NOTE — Assessment & Plan Note (Signed)
Still unable to sleep/rest well at night, desires to increased Sertraline 75mg  qhs instead of 50mg  in am. Will observe the patient.

## 2013-01-17 NOTE — Assessment & Plan Note (Signed)
With clear mucous-chronic, Claritin doesn't help much, off it now. Continue  Mucinex 600mg bid since it works better.      

## 2013-01-17 NOTE — Assessment & Plan Note (Addendum)
C/o left shoulder pain down to elbow, having discomfort during restorative exercising, thinks he may have injured his shoulder during last fall in apartment. Chronic multiple joints arthritis-s/p R+L knee replacements, s/p right shoulder rotator cuff repair, lumbar spine injx2 with little efficacy-then instructed to apply Voltaren gel with little improvement. R lower back pain on and off--not interfering his ADLs. Left shoulder pain with ROM became problematic for a while then subsided. He desires to increase Tramadol to bid for better pain control.

## 2013-01-30 DIAGNOSIS — R351 Nocturia: Secondary | ICD-10-CM | POA: Diagnosis not present

## 2013-01-30 DIAGNOSIS — N200 Calculus of kidney: Secondary | ICD-10-CM | POA: Diagnosis not present

## 2013-01-30 DIAGNOSIS — N401 Enlarged prostate with lower urinary tract symptoms: Secondary | ICD-10-CM | POA: Diagnosis not present

## 2013-02-05 ENCOUNTER — Encounter: Payer: Self-pay | Admitting: Internal Medicine

## 2013-02-05 ENCOUNTER — Ambulatory Visit (INDEPENDENT_AMBULATORY_CARE_PROVIDER_SITE_OTHER): Payer: Medicare Other | Admitting: Internal Medicine

## 2013-02-05 VITALS — BP 124/64 | HR 62 | Ht 68.0 in | Wt 177.2 lb

## 2013-02-05 DIAGNOSIS — R0989 Other specified symptoms and signs involving the circulatory and respiratory systems: Secondary | ICD-10-CM

## 2013-02-05 DIAGNOSIS — Z950 Presence of cardiac pacemaker: Secondary | ICD-10-CM | POA: Diagnosis not present

## 2013-02-05 DIAGNOSIS — I495 Sick sinus syndrome: Secondary | ICD-10-CM

## 2013-02-05 DIAGNOSIS — I2510 Atherosclerotic heart disease of native coronary artery without angina pectoris: Secondary | ICD-10-CM | POA: Diagnosis not present

## 2013-02-05 DIAGNOSIS — I10 Essential (primary) hypertension: Secondary | ICD-10-CM

## 2013-02-05 LAB — PACEMAKER DEVICE OBSERVATION
AL IMPEDENCE PM: 480 Ohm
AL THRESHOLD: 0.5 V
BAMS-0001: 130 {beats}/min
BATTERY VOLTAGE: 2.79 V
RV LEAD THRESHOLD: 1 V

## 2013-02-05 NOTE — Progress Notes (Signed)
PCP:  Dr Chilton Si Primary Cardiologist:  Dr Suella Broad is a 79 y.o. male with a h/o symptomatic bradycardia sp PPM (MDT) by Dr Amil Amen who presents today for follow-up in the Electrophysiology device clinic.  The patient reports doing very well since his recent ppm generator change.    Today, he  denies symptoms of palpitations, chest pain, shortness of breath, orthopnea, PND, lower extremity edema, dizziness, presyncope, syncope, or neurologic sequela.  His energy has improved since his generator change. The patientis tolerating medications without difficulties and is otherwise without complaint today.   Past Medical History  Diagnosis Date  . Osteoarthritis   . HTN (hypertension)   . BPH (benign prostatic hyperplasia)   . Sick sinus syndrome 10/14/2003; 10/29/2012    MDT EnPulse implanted by Dr Amil Amen for SSS; generator change 10/29/2012 by Dr Johney Frame MDT Jana Half pacemaker  . CAD (coronary artery disease)     s/p CABG 2005  . Macular degeneration   . Hyperlipidemia   . Depression     mild per pt  . Sporotrichosis     remote  . History of rhabdomyolysis    Past Surgical History  Procedure Laterality Date  . Pacemaker insertion  10/14/2003;10/29/2012    MDT Implanted by Dr Amil Amen for SSS; generator change 10/29/2012 by Dr Johney Frame [employer] [place]  . Coronary artery bypass graft    . Bilateral knee arthroplasty      History   Social History  . Marital Status: Single    Spouse Name: N/A    Number of Children: N/A  . Years of Education: N/A   Occupational History  . Not on file.   Social History Main Topics  . Smoking status: Never Smoker   . Smokeless tobacco: Not on file  . Alcohol Use: No  . Drug Use: No  . Sexual Activity: Not on file   Other Topics Concern  . Not on file   Social History Narrative   Pt lives in assisted living at [facility] following a fall 12/13.   Retired IT trainer.    Family History  Problem Relation Age of Onset  . Cancer       Allergies  Allergen Reactions  . Celebrex [Celecoxib] Other (See Comments)    unknown  . Sulfur Other (See Comments)    unknown  . Vioxx [Rofecoxib] Other (See Comments)    unknown    Current Outpatient Prescriptions  Medication Sig Dispense Refill  . amoxicillin (AMOXIL) 500 MG capsule Take 2,000 mg by mouth daily as needed (prior to surgery).      Marland Kitchen aspirin 81 MG tablet Take 81 mg by mouth daily.      . bimatoprost (LUMIGAN) 0.01 % SOLN Place 1 drop into both eyes at bedtime.       . brimonidine-timolol (COMBIGAN) 0.2-0.5 % ophthalmic solution Place 1 drop into both eyes every 12 (twelve) hours.      . finasteride (PROSCAR) 5 MG tablet Take one tablet by mouth once daily      . folic acid (FOLVITE) 1 MG tablet Take 1 mg by mouth daily.      . furosemide (LASIX) 20 MG tablet Take one tablet by mouth once daily      . guaiFENesin (MUCINEX) 600 MG 12 hr tablet Take one tablet twice daily as needed for congestion      . Iodoquinol-HC (HYDROCORTISONE-IODOQUINOL) 1-1 % CREA Apply topically as needed.      Marland Kitchen KLOR-CON M10 10  MEQ tablet Take 10 mEq by mouth daily.      Marland Kitchen lisinopril (PRINIVIL,ZESTRIL) 20 MG tablet Take 20 mg by mouth 2 (two) times daily.      . Multiple Vitamins-Minerals (ICAPS AREDS FORMULA PO) Take 1 tablet by mouth daily.      . nitroGLYCERIN (NITROSTAT) 0.4 MG SL tablet Place 0.4 mg under the tongue every 5 (five) minutes as needed. For chest pain      . nystatin-triamcinolone (MYCOLOG II) cream Apply topically as needed.      Marland Kitchen omeprazole (PRILOSEC) 20 MG capsule Take 20 mg by mouth daily.      . polyethylene glycol (MIRALAX / GLYCOLAX) packet Take 17 g by mouth daily as needed. For constipation      . sertraline (ZOLOFT) 50 MG tablet Take 75 mg by mouth daily.       . Tamsulosin HCl (FLOMAX) 0.4 MG CAPS Take 0.4 mg by mouth daily.      . traMADol (ULTRAM) 50 MG tablet Take 100 mg by mouth 2 (two) times daily.      Marland Kitchen zolpidem (AMBIEN) 5 MG tablet Take 1 tablet (5 mg  total) by mouth at bedtime.  30 tablet  5   No current facility-administered medications for this visit.    ROS- all systems are reviewed and negative except as per HPI  Physical Exam: Filed Vitals:   02/05/13 1210  BP: 124/64  Pulse: 62  Height: 5\' 8"  (1.727 m)  Weight: 177 lb 3.2 oz (80.377 kg)    GEN- The patient is well appearing, alert and oriented x 3 today.   Head- normocephalic, atraumatic Eyes-  Sclera clear, conjunctiva pink Ears- hearing intact Oropharynx- clear Neck- supple, no JVP Lymph- no cervical lymphadenopathy Lungs- Clear to ausculation bilaterally, normal work of breathing Chest- R sided pacemaker pocket is well healed Heart- Regular rate and rhythm,  GI- soft, NT, ND, + BS Extremities- no clubbing, cyanosis, or edema MS- age appropriate muscle atrophy, walks with a cane Skin- no rash or lesion Psych- euthymic mood, full affect, very pleasant Neuro- strength and sensation are intact  Pacemaker interrogation- reviewed in detail today,  See PACEART report  Assessment and Plan:  1. Sick sinus syndrome Normal pacemaker function See Pace Art report No changes today  2. CAD Stable No change required today  3. HTN Stable No change required today  carelink every 3 months Return to the device clinic in 1 year

## 2013-02-05 NOTE — Patient Instructions (Signed)
Your physician wants you to follow-up in: 10/2013 with Dr Johney Frame Bonita Quin will receive a reminder letter in the mail two months in advance. If you don't receive a letter, please call our office to schedule the follow-up appointment.  Remote monitoring is used to monitor your Pacemaker or ICD from home. This monitoring reduces the number of office visits required to check your device to one time per year. It allows [place] to keep an eye on the functioning of your device to ensure it is working properly. You are scheduled for a device check from home on 05/09/2013. You may send your transmission at any time that day. If you have a wireless device, the transmission will be sent automatically. After your physician reviews your transmission, you will receive a postcard with your next transmission date.

## 2013-02-07 ENCOUNTER — Other Ambulatory Visit: Payer: Self-pay | Admitting: *Deleted

## 2013-02-07 MED ORDER — TRAMADOL HCL 50 MG PO TABS: 100.0000 mg | ORAL_TABLET | Freq: Two times a day (BID) | ORAL | Status: DC

## 2013-02-07 MED ORDER — ZOLPIDEM TARTRATE 5 MG PO TABS: 5.0000 mg | ORAL_TABLET | Freq: Every day | ORAL | Status: DC

## 2013-02-07 NOTE — Telephone Encounter (Signed)
rx refilled per protocal. 

## 2013-02-10 ENCOUNTER — Other Ambulatory Visit: Payer: Self-pay | Admitting: *Deleted

## 2013-02-10 MED ORDER — ZOLPIDEM TARTRATE 5 MG PO TABS: ORAL_TABLET | ORAL | Status: DC

## 2013-02-12 DIAGNOSIS — H35059 Retinal neovascularization, unspecified, unspecified eye: Secondary | ICD-10-CM | POA: Diagnosis not present

## 2013-02-12 DIAGNOSIS — H353290 Exudative age-related macular degeneration, unspecified eye, stage unspecified: Secondary | ICD-10-CM | POA: Diagnosis not present

## 2013-02-19 DIAGNOSIS — H353190 Nonexudative age-related macular degeneration, unspecified eye, stage unspecified: Secondary | ICD-10-CM | POA: Diagnosis not present

## 2013-02-19 DIAGNOSIS — H353290 Exudative age-related macular degeneration, unspecified eye, stage unspecified: Secondary | ICD-10-CM | POA: Diagnosis not present

## 2013-03-04 ENCOUNTER — Non-Acute Institutional Stay: Payer: Medicare Other | Admitting: Internal Medicine

## 2013-03-04 ENCOUNTER — Encounter: Payer: Self-pay | Admitting: Internal Medicine

## 2013-03-04 VITALS — BP 110/66 | HR 60 | Ht 68.0 in | Wt 176.0 lb

## 2013-03-04 DIAGNOSIS — R05 Cough: Secondary | ICD-10-CM | POA: Diagnosis not present

## 2013-03-04 DIAGNOSIS — I10 Essential (primary) hypertension: Secondary | ICD-10-CM | POA: Diagnosis not present

## 2013-03-04 DIAGNOSIS — R0989 Other specified symptoms and signs involving the circulatory and respiratory systems: Secondary | ICD-10-CM

## 2013-03-04 DIAGNOSIS — I2510 Atherosclerotic heart disease of native coronary artery without angina pectoris: Secondary | ICD-10-CM

## 2013-03-04 DIAGNOSIS — H3530 Unspecified macular degeneration: Secondary | ICD-10-CM

## 2013-03-04 DIAGNOSIS — I509 Heart failure, unspecified: Secondary | ICD-10-CM

## 2013-03-04 DIAGNOSIS — F329 Major depressive disorder, single episode, unspecified: Secondary | ICD-10-CM

## 2013-03-04 DIAGNOSIS — Z299 Encounter for prophylactic measures, unspecified: Secondary | ICD-10-CM

## 2013-03-04 DIAGNOSIS — N400 Enlarged prostate without lower urinary tract symptoms: Secondary | ICD-10-CM

## 2013-03-04 NOTE — Patient Instructions (Signed)
Continue current medications. 

## 2013-03-04 NOTE — Progress Notes (Signed)
Subjective:    Patient ID: Caleb Nash, male    DOB: 02-26-1914, 79 y.o.   MRN: [id number]  Chief Complaint  Patient presents with  . Medical Managment of Chronic Issues    blood pressure, A-Fib, CHF, COPD. c/o coughing up mucus, coughing up more since using Mucinex.     HPI Right hip area pain. Uses Voltaren gel about twice daily.  Has had itching on his hands. Uses a liquid moisturizing soap.  Has seen Dr. Isabel Caprice about bladder and prostate. Hx BPH.  Dr. Luciana Axe gives injection of Lucentis every 6 weeks for his macular degeneration.  Finds himself drooling for more than a year.    Current Outpatient Prescriptions on File Prior to Visit  Medication Sig Dispense Refill  . amoxicillin (AMOXIL) 500 MG capsule Take 2,000 mg by mouth daily as needed (prior to surgery).      Marland Kitchen aspirin 81 MG tablet Take 81 mg by mouth daily.      . bimatoprost (LUMIGAN) 0.01 % SOLN Place 1 drop into both eyes at bedtime.       . brimonidine-timolol (COMBIGAN) 0.2-0.5 % ophthalmic solution Place 1 drop into both eyes every 12 (twelve) hours.      . finasteride (PROSCAR) 5 MG tablet Take one tablet by mouth once daily      . folic acid (FOLVITE) 1 MG tablet Take 1 mg by mouth daily.      . furosemide (LASIX) 20 MG tablet Take one tablet by mouth once daily. One twice weekly as needed for increased weight 2-3 lbs      . guaiFENesin (MUCINEX) 600 MG 12 hr tablet Take one tablet twice daily as needed for congestion      . Iodoquinol-HC (HYDROCORTISONE-IODOQUINOL) 1-1 % CREA Apply topically as needed.      Marland Kitchen KLOR-CON M10 10 MEQ tablet Take 10 mEq by mouth. Twice weekly as needed with Lasix      . lisinopril (PRINIVIL,ZESTRIL) 20 MG tablet Take 20 mg by mouth 2 (two) times daily.      . Multiple Vitamins-Minerals (ICAPS AREDS FORMULA PO) Take 1 tablet by mouth daily.      . nitroGLYCERIN (NITROSTAT) 0.4 MG SL tablet Place 0.4 mg under the tongue every 5 (five) minutes as needed. For chest pain      .  omeprazole (PRILOSEC) 20 MG capsule Take 20 mg by mouth daily.      . polyethylene glycol (MIRALAX / GLYCOLAX) packet Take 17 g by mouth daily as needed. For constipation      . sertraline (ZOLOFT) 50 MG tablet Take 75 mg by mouth daily.       . Tamsulosin HCl (FLOMAX) 0.4 MG CAPS Take 0.4 mg by mouth daily.      Marland Kitchen zolpidem (AMBIEN) 5 MG tablet Take one tablet at bedtime as needed for insomnia  30 tablet  5   No current facility-administered medications on file prior to visit.    Review of Systems  Constitutional: Negative.  Negative for activity change, appetite change, fatigue and unexpected weight change.  HENT: Negative.  Negative for hearing loss, sinus pressure and sore throat.   Eyes:       Hx macular degeneration  Respiratory: Positive for cough and shortness of breath. Negative for apnea, chest tightness, wheezing and stridor.   Cardiovascular: Negative for chest pain, palpitations and leg swelling.  Gastrointestinal: Positive for nausea. Negative for vomiting, abdominal pain and abdominal distention.  Endocrine: Negative.   Genitourinary: Negative  for urgency, frequency and decreased urine volume.       BPH.  Musculoskeletal: Positive for gait problem (uunstable. uses walkeer). Negative for arthralgias, back pain, myalgias, neck pain and neck stiffness.  Skin: Negative.        Dry. Itching on dorsum of hands.  Allergic/Immunologic: Negative.   Neurological: Negative.   Hematological: Negative.   Psychiatric/Behavioral: Positive for sleep disturbance (iinsomnia. Ambien helps.).       Objective:   Physical Exam  Constitutional: He is oriented to person, place, and time. He appears well-nourished. No distress.  Frail  HENT:  Head: Normocephalic and atraumatic.  Right Ear: External ear normal.  Left Ear: External ear normal.  Nose: Nose normal.  Mouth/Throat: Oropharynx is clear and moist.  Eyes: Conjunctivae and EOM are normal. Pupils are equal, round, and reactive to  light.  Neck: No JVD present. No tracheal deviation present. No thyromegaly present.  Cardiovascular: Normal rate, regular rhythm and intact distal pulses.  Exam reveals friction rub. Exam reveals no gallop.   No murmur heard. Pulmonary/Chest: Effort normal and breath sounds normal. No respiratory distress. He has no wheezes. He has no rales.  Abdominal: He exhibits no distension and no mass. There is no tenderness.  Musculoskeletal: Normal range of motion. He exhibits no edema and no tenderness.  Using walker.  Lymphadenopathy:    He has no cervical adenopathy.  Neurological: He is alert and oriented to person, place, and time. He has normal reflexes. No cranial nerve deficit. Coordination normal.  Skin: Skin is dry. No rash noted. No erythema. No pallor.  Psychiatric: He has a normal mood and affect. His behavior is normal. Judgment and thought content normal.     LAB REVIEW 11/28/12 BMP: nl  TSH; 3.087  BNP 181.3     Assessment & Plan:  Cough: persistent  CAD (coronary artery disease); no angina  Labile hypertension: controlled  CHF (congestive heart failure): controlled  Macular degeneration: continue with Lucentis  Depression:improved  BPH (benign prostatic hyperplasia): asymptomatic  Preventive measure - Plan: DNR (Do Not Resuscitate)

## 2013-03-07 ENCOUNTER — Other Ambulatory Visit: Payer: Self-pay | Admitting: *Deleted

## 2013-03-07 MED ORDER — ZOLPIDEM TARTRATE 5 MG PO TABS: ORAL_TABLET | ORAL | Status: DC

## 2013-03-28 ENCOUNTER — Other Ambulatory Visit: Payer: Self-pay | Admitting: *Deleted

## 2013-03-28 MED ORDER — TRAMADOL HCL 50 MG PO TABS: ORAL_TABLET | ORAL | Status: DC

## 2013-04-02 DIAGNOSIS — H35359 Cystoid macular degeneration, unspecified eye: Secondary | ICD-10-CM | POA: Diagnosis not present

## 2013-04-02 DIAGNOSIS — H353290 Exudative age-related macular degeneration, unspecified eye, stage unspecified: Secondary | ICD-10-CM | POA: Diagnosis not present

## 2013-04-02 DIAGNOSIS — H35059 Retinal neovascularization, unspecified, unspecified eye: Secondary | ICD-10-CM | POA: Diagnosis not present

## 2013-04-07 DIAGNOSIS — H4011X0 Primary open-angle glaucoma, stage unspecified: Secondary | ICD-10-CM | POA: Diagnosis not present

## 2013-04-07 DIAGNOSIS — H20019 Primary iridocyclitis, unspecified eye: Secondary | ICD-10-CM | POA: Diagnosis not present

## 2013-04-08 ENCOUNTER — Non-Acute Institutional Stay: Payer: Medicare Other | Admitting: Nurse Practitioner

## 2013-04-08 ENCOUNTER — Encounter: Payer: Self-pay | Admitting: Nurse Practitioner

## 2013-04-08 DIAGNOSIS — J209 Acute bronchitis, unspecified: Secondary | ICD-10-CM | POA: Diagnosis not present

## 2013-04-08 DIAGNOSIS — I10 Essential (primary) hypertension: Secondary | ICD-10-CM | POA: Diagnosis not present

## 2013-04-08 DIAGNOSIS — N400 Enlarged prostate without lower urinary tract symptoms: Secondary | ICD-10-CM | POA: Diagnosis not present

## 2013-04-08 DIAGNOSIS — I509 Heart failure, unspecified: Secondary | ICD-10-CM

## 2013-04-08 DIAGNOSIS — M1990 Unspecified osteoarthritis, unspecified site: Secondary | ICD-10-CM

## 2013-04-08 DIAGNOSIS — K5900 Constipation, unspecified: Secondary | ICD-10-CM

## 2013-04-08 DIAGNOSIS — F32A Depression, unspecified: Secondary | ICD-10-CM

## 2013-04-08 DIAGNOSIS — F329 Major depressive disorder, single episode, unspecified: Secondary | ICD-10-CM

## 2013-04-08 DIAGNOSIS — K219 Gastro-esophageal reflux disease without esophagitis: Secondary | ICD-10-CM

## 2013-04-08 NOTE — Assessment & Plan Note (Signed)
Compensated on Furosemide 40mg , the patient c/o urinary frequency at night, no apparent edema presently, subsequently 11/15/12 Furosemide was decreased  to 20mg  daily, BMP unremarkable/BNP 181.3 11/28/12,  weight 2x/week. #3Ibs-prn Lasix 20mg  x2/week available to him. Update CMP

## 2013-04-08 NOTE — Assessment & Plan Note (Signed)
C/o left shoulder pain down to elbow, having discomfort during restorative exercising, thinks he may have injured his shoulder during last fall in apartment. Chronic multiple joints arthritis-s/p R+L knee replacements, s/p right shoulder rotator cuff repair, lumbar spine injx2 with little efficacy-then instructed to apply Voltaren gel with little improvement. R lower back pain on and off--not interfering his ADLs. Left shoulder pain with ROM became problematic for a while then subsided. Livable with Tramadol 100mg  nightly.

## 2013-04-08 NOTE — Progress Notes (Signed)
Patient ID: Caleb Nash, male   DOB: January 16, 1914, 79 y.o.   MRN: [id number]   Code Status: DNR  Allergies  Allergen Reactions  . Celebrex [Celecoxib] Other (See Comments)    unknown  . Sulfur Other (See Comments)    unknown  . Vioxx [Rofecoxib] Other (See Comments)    unknown    Chief Complaint  Patient presents with  . Medical Managment of Chronic Issues    congestive cough  . Acute Visit    HPI: Patient is a 79 y.o. male seen in the AL at [facility] today for  evaluation of congestive cough and other chronic medical conditions Problem List Items Addressed This Visit   Depression (Chronic)     Better with Sertraline 75mg  qhs. Will observe the patient.             Degenerative joint disease (Chronic)     C/o left shoulder pain down to elbow, having discomfort during restorative exercising, thinks he may have injured his shoulder during last fall in apartment. Chronic multiple joints arthritis-s/p R+L knee replacements, s/p right shoulder rotator cuff repair, lumbar spine injx2 with little efficacy-then instructed to apply Voltaren gel with little improvement. R lower back pain on and off--not interfering his ADLs. Left shoulder pain with ROM became problematic for a while then subsided. Livable with Tramadol 100mg  nightly.         BPH (benign prostatic hyperplasia)     Tamsulosin 0.4mg  with nocturnal urinary frequency 4x/night. Added Finasteride 5mg  daily helped to reduced urinary frequency.            CHF (congestive heart failure)     Compensated on Furosemide 40mg , the patient c/o urinary frequency at night, no apparent edema presently, subsequently 11/15/12 Furosemide was decreased  to 20mg  daily, BMP unremarkable/BNP 181.3 11/28/12,  weight 2x/week. #3Ibs-prn Lasix 20mg  x2/week available to him. Update CMP          Unspecified constipation     Stable on MiraLax qod        GERD (gastroesophageal reflux disease)     Stable on Omeprazole.            Essential hypertension - Primary     Controlled on Lisinopril 20mg .             Acute bronchitis     Congestive cough and mild throat erythema. Clear sputum production, denied chest pain or SOB, afebrile, no wheezes present, Augmentin 875mg  bid for 7 days along with FloraStor, Medrol dose pk. Update CBC       Review of Systems:  Review of Systems  Constitutional: Negative for fever, chills, weight loss and malaise/fatigue.  HENT: Positive for congestion and hearing loss. Negative for ear discharge and sore throat.   Eyes: Negative for blurred vision, double vision and photophobia.       Macular degeneration OU--f/u Ophthalmology, 2 eye drops. Had inj >20x in the past per patient.   Respiratory: Positive for cough and sputum production (clear mucous). Negative for shortness of breath and wheezing.        Worsened congestive cough.   Cardiovascular: Positive for palpitations. Negative for chest pain, orthopnea, claudication, leg swelling and PND.  Gastrointestinal: Negative for heartburn, nausea, vomiting, abdominal pain, diarrhea, constipation and blood in stool.  Genitourinary: Positive for frequency. Negative for dysuria, urgency, hematuria and flank pain.  Musculoskeletal: Positive for back pain. Negative for falls, myalgias and neck pain. Joint pain: left shoudler and toes pain-managed with Tramadol.  Skin: Negative for itching and rash.  Neurological: Negative for dizziness, tingling, tremors, sensory change, speech change, focal weakness, seizures, loss of consciousness, weakness and headaches.  Endo/Heme/Allergies: Negative for environmental allergies and polydipsia. Does not bruise/bleed easily.  Psychiatric/Behavioral: Positive for depression. Negative for hallucinations, memory loss and substance abuse. The patient has insomnia. The patient is not nervous/anxious.      Past Medical History  Diagnosis Date  . Osteoarthritis   . HTN (hypertension)   .  BPH (benign prostatic hyperplasia)   . Sick sinus syndrome 10/14/2003; 10/29/2012    MDT EnPulse implanted by Dr Amil Amen for SSS; generator change 10/29/2012 by Dr Johney Frame MDT Jana Half pacemaker  . CAD (coronary artery disease)     s/p CABG 2005  . Macular degeneration   . Hyperlipidemia   . Depression     mild per pt  . Sporotrichosis     remote  . History of rhabdomyolysis   . Ingrowing nail 06/11/2012  . Abnormality of gait 06/11/2012  . Major depressive disorder, single episode, unspecified 04/28/2012  . Unspecified glaucoma(365.9) 04/28/2012  . Coronary atherosclerosis of native coronary artery 04/28/2012  . Atrial fibrillation 04/28/2012  . Congestive heart failure, unspecified 04/28/2012  . Reflux esophagitis 04/28/2012  . Hypertrophy of prostate without urinary obstruction and other lower urinary tract symptoms (LUTS) 04/28/2012  . Insomnia, unspecified 04/28/2012  . Edema 04/28/2012  . Allergy, unspecified not elsewhere classified 04/28/2012  . Personal history of fall 04/28/2012  . Cardiac pacemaker in situ 04/28/2012  . Other specified disease of white blood cells 04/26/2012  . Alcohol abuse 04/26/2012  . Chronic airway obstruction, not elsewhere classified 04/26/2012  . Muscle weakness (generalized) 04/26/2012   Past Surgical History  Procedure Laterality Date  . Pacemaker insertion  10/14/2003;10/29/2012    MDT Implanted by Dr Amil Amen for SSS; generator change 10/29/2012 by Dr Johney Frame [employer] [place]  . Coronary artery bypass graft  2005  . Bilateral knee arthroplasty  09/28/83/05/17/88    right then lefta   . Appendectomy    . Yag laser application Right 12/26/1973    eye  . Hernia repair Right 05/19/1976  . Transurethral resection of prostate  07/27/1983  . Cystoscopy  08/14/85  . Epididymis surgery  09/17/1985  . Angioplasty  9/11/190    Dr. Katrinka Blazing  . Replacement total knee bilateral  1992 and 1996  . Cataract extraction Right 10/30/1996  . Shoulder open rotator cuff repair Left 02/07/1996  . Back surgery   03/11/2001  . Yag laser application Right 04/30/2001  . Yag laser application Left 05/07/2001  . Toe surgery Right 2005    correction  . Carpal tunnel release Left   . Elbow surgery      repair  . Carpal tunnel release Right 2007  . Rotator cuff repair Right 2008  . Sigmoidoscopy  08/26/2001   Social History:   reports that he has never smoked. He has never used smokeless tobacco. He reports that he does not drink alcohol or use illicit drugs.  Family History  Problem Relation Age of Onset  . Cancer    . CVA Mother   . CVA Father     Medications: Patient's Medications  New Prescriptions   No medications on file  Previous Medications   AMOXICILLIN (AMOXIL) 500 MG CAPSULE    Take 2,000 mg by mouth daily as needed (prior to surgery).   ASPIRIN 81 MG TABLET    Take 81 mg by mouth daily.   BIMATOPROST (LUMIGAN) 0.01 %  SOLN    Place 1 drop into both eyes at bedtime.    BRIMONIDINE-TIMOLOL (COMBIGAN) 0.2-0.5 % OPHTHALMIC SOLUTION    Place 1 drop into both eyes every 12 (twelve) hours.   FINASTERIDE (PROSCAR) 5 MG TABLET    Take one tablet by mouth once daily   FOLIC ACID (FOLVITE) 1 MG TABLET    Take 1 mg by mouth daily.   FUROSEMIDE (LASIX) 20 MG TABLET    Take one tablet by mouth once daily. One twice weekly as needed for increased weight 2-3 lbs   GUAIFENESIN (MUCINEX) 600 MG 12 HR TABLET    Take one tablet twice daily as needed for congestion   IODOQUINOL-HC (HYDROCORTISONE-IODOQUINOL) 1-1 % CREA    Apply topically as needed.   KLOR-CON M10 10 MEQ TABLET    Take 10 mEq by mouth. Twice weekly as needed with Lasix   LISINOPRIL (PRINIVIL,ZESTRIL) 20 MG TABLET    Take 20 mg by mouth 2 (two) times daily.   MULTIPLE VITAMINS-MINERALS (ICAPS AREDS FORMULA PO)    Take 1 tablet by mouth daily.   NITROGLYCERIN (NITROSTAT) 0.4 MG SL TABLET    Place 0.4 mg under the tongue every 5 (five) minutes as needed. For chest pain   OMEPRAZOLE (PRILOSEC) 20 MG CAPSULE    Take 20 mg by mouth daily.    POLYETHYLENE GLYCOL (MIRALAX / GLYCOLAX) PACKET    Take 17 g by mouth daily as needed. For constipation   SERTRALINE (ZOLOFT) 50 MG TABLET    Take 75 mg by mouth daily.    TAMSULOSIN HCL (FLOMAX) 0.4 MG CAPS    Take 0.4 mg by mouth daily.   TRAMADOL (ULTRAM) 50 MG TABLET    Take two tablets by mouth at bedtime for pain   ZOLPIDEM (AMBIEN) 5 MG TABLET    Take one tablet at bedtime as needed for insomnia  Modified Medications   No medications on file  Discontinued Medications   No medications on file     Physical Exam: Physical Exam  Constitutional: He is oriented to person, place, and time. He appears well-developed and well-nourished. No distress.  HENT:  Head: Normocephalic and atraumatic.  Right Ear: External ear normal.  Left Ear: External ear normal.  Mouth/Throat: No oropharyngeal exudate.  Mild throat erythema.   Eyes: Conjunctivae and EOM are normal. Pupils are equal, round, and reactive to light. No scleral icterus.  Neck: Normal range of motion. Neck supple. No JVD present. No tracheal deviation present. No thyromegaly present.  Cardiovascular: Normal rate, regular rhythm and normal heart sounds.   No murmur heard. Pulmonary/Chest: Effort normal. No respiratory distress. He has decreased breath sounds. He has no wheezes. He has rales in the right lower field and the left lower field.  Abdominal: He exhibits no distension. There is no tenderness. There is no rebound.  Musculoskeletal: He exhibits tenderness. He exhibits no edema.  Slightly decreased ROM of the left shoulder with pain. Toes pain.   Lymphadenopathy:    He has no cervical adenopathy.  Neurological: He is alert and oriented to person, place, and time. He has normal reflexes. No cranial nerve deficit. He exhibits normal muscle tone. Coordination normal.  Skin: Skin is warm and dry. No rash noted. He is not diaphoretic. No erythema.  Psychiatric: He has a normal mood and affect. His behavior is normal. Judgment and  thought content normal.    Filed Vitals:   04/08/13 1535  BP: 164/72  Pulse: 72  Temp: 98.5  F (36.9 C)  TempSrc: Tympanic  Resp: 18      Labs reviewed: Basic Metabolic Panel:  Recent Labs  40/98/11 1550 04/22/12 0004  04/23/12 0557 04/24/12 0645 04/25/12 0435 10/23/12 11/28/12  NA 135  --   < > 139 143 146* 137 136*  K 4.7  --   < > 3.5 3.4* 4.1 4.7 4.3  CL 93*  --   < > 108 110 109  --   --   CO2 14*  --   < > 19 19 24   --   --   GLUCOSE 154*  --   < > 158* 166* 143*  --   --   BUN 42*  --   < > 26* 28* 31* 42* 31*  CREATININE 1.14  --   < > 0.78 0.82 1.01 1.4* 1.4*  CALCIUM 9.9  --   < > 8.7 8.9 9.1  --   --   MG  --  1.9  --   --   --   --   --   --   PHOS  --  4.3  --   --   --   --   --   --   TSH  --   --   --   --   --   --   --  3.09  < > = values in this interval not displayed. Liver Function Tests:  Recent Labs  04/21/12 1550  AST 138*  ALT 41  ALKPHOS 93  BILITOT 0.7  PROT 7.5  ALBUMIN 3.4*   CBC:  Recent Labs  04/21/12 1550  04/23/12 0557 04/24/12 0645 04/25/12 0435 10/23/12  WBC 17.3*  < > 12.5* 18.1* 16.7* 6.9  NEUTROABS 15.1*  --   --   --   --   --   HGB 13.4  < > 11.5* 13.1 12.7* 12.9*  HCT 39.9  < > 33.1* 38.3* 38.0* 36*  MCV 98.3  < > 97.4 98.5 100.3*  --   PLT 239  < > 235 252 265 157  < > = values in this interval not displayed. Lipid Panel:  Recent Labs  04/22/12 0004  CHOL 149  HDL 35*  LDLCALC 96  TRIG 89  CHOLHDL 4.3   Assessment/Plan Essential hypertension Controlled on Lisinopril 20mg .           CHF (congestive heart failure) Compensated on Furosemide 40mg , the patient c/o urinary frequency at night, no apparent edema presently, subsequently 11/15/12 Furosemide was decreased  to 20mg  daily, BMP unremarkable/BNP 181.3 11/28/12,  weight 2x/week. #3Ibs-prn Lasix 20mg  x2/week available to him. Update CMP        BPH (benign prostatic hyperplasia) Tamsulosin 0.4mg  with nocturnal urinary frequency  4x/night. Added Finasteride 5mg  daily helped to reduced urinary frequency.          Unspecified constipation Stable on MiraLax qod      GERD (gastroesophageal reflux disease) Stable on Omeprazole.         Depression Better with Sertraline 75mg  qhs. Will observe the patient.           Degenerative joint disease C/o left shoulder pain down to elbow, having discomfort during restorative exercising, thinks he may have injured his shoulder during last fall in apartment. Chronic multiple joints arthritis-s/p R+L knee replacements, s/p right shoulder rotator cuff repair, lumbar spine injx2 with little efficacy-then instructed to apply Voltaren gel with little improvement. R lower back pain on and off--not  interfering his ADLs. Left shoulder pain with ROM became problematic for a while then subsided. Livable with Tramadol 100mg  nightly.       Acute bronchitis Congestive cough and mild throat erythema. Clear sputum production, denied chest pain or SOB, afebrile, no wheezes present, Augmentin 875mg  bid for 7 days along with FloraStor, Medrol dose pk. Update CBC    Family/ Staff Communication: observe the patient.   Goals of Care: AL  Labs/tests ordered: CBC and CMP

## 2013-04-08 NOTE — Assessment & Plan Note (Signed)
Better with Sertraline 75mg  qhs. Will observe the patient.

## 2013-04-08 NOTE — Assessment & Plan Note (Signed)
Stable on Omeprazole.  

## 2013-04-08 NOTE — Assessment & Plan Note (Signed)
Controlled on Lisinopril 20mg 

## 2013-04-08 NOTE — Assessment & Plan Note (Signed)
Stable on MiraLax qod                 

## 2013-04-08 NOTE — Assessment & Plan Note (Signed)
Tamsulosin 0.4mg  with nocturnal urinary frequency 4x/night. Added Finasteride 5mg  daily helped to reduced urinary frequency.

## 2013-04-08 NOTE — Assessment & Plan Note (Signed)
Congestive cough and mild throat erythema. Clear sputum production, denied chest pain or SOB, afebrile, no wheezes present, Augmentin 875mg  bid for 7 days along with FloraStor, Medrol dose pk. Update CBC

## 2013-04-10 DIAGNOSIS — I10 Essential (primary) hypertension: Secondary | ICD-10-CM | POA: Diagnosis not present

## 2013-04-10 DIAGNOSIS — D649 Anemia, unspecified: Secondary | ICD-10-CM | POA: Diagnosis not present

## 2013-04-10 LAB — CBC AND DIFFERENTIAL
HCT: 39 % — AB (ref 41–53)
HEMOGLOBIN: 13.2 g/dL — AB (ref 13.5–17.5)
Platelets: 147 10*3/uL — AB (ref 150–399)
WBC: 8.3 10^3/mL

## 2013-04-21 DIAGNOSIS — H1044 Vernal conjunctivitis: Secondary | ICD-10-CM | POA: Diagnosis not present

## 2013-05-09 ENCOUNTER — Ambulatory Visit (INDEPENDENT_AMBULATORY_CARE_PROVIDER_SITE_OTHER): Payer: Medicare Other | Admitting: *Deleted

## 2013-05-09 DIAGNOSIS — I495 Sick sinus syndrome: Secondary | ICD-10-CM

## 2013-05-09 DIAGNOSIS — I471 Supraventricular tachycardia: Secondary | ICD-10-CM

## 2013-05-14 LAB — MDC_IDC_ENUM_SESS_TYPE_REMOTE
Battery Impedance: 106 Ohm
Battery Remaining Longevity: 115 mo
Battery Voltage: 2.79 V
Brady Statistic AP VP Percent: 87 %
Lead Channel Impedance Value: 466 Ohm
Lead Channel Setting Pacing Amplitude: 2.5 V
Lead Channel Setting Pacing Pulse Width: 0.4 ms
Lead Channel Setting Sensing Sensitivity: 5.6 mV
MDC IDC MSMT LEADCHNL RA PACING THRESHOLD AMPLITUDE: 0.5 V
MDC IDC MSMT LEADCHNL RA PACING THRESHOLD PULSEWIDTH: 0.4 ms
MDC IDC MSMT LEADCHNL RV IMPEDANCE VALUE: 632 Ohm
MDC IDC MSMT LEADCHNL RV PACING THRESHOLD AMPLITUDE: 0.875 V
MDC IDC MSMT LEADCHNL RV PACING THRESHOLD PULSEWIDTH: 0.4 ms
MDC IDC SESS DTM: 20150116172003
MDC IDC SET LEADCHNL RA PACING AMPLITUDE: 2 V
MDC IDC STAT BRADY AP VS PERCENT: 0 %
MDC IDC STAT BRADY AS VP PERCENT: 13 %
MDC IDC STAT BRADY AS VS PERCENT: 0 %

## 2013-05-20 ENCOUNTER — Encounter: Payer: Self-pay | Admitting: *Deleted

## 2013-05-23 ENCOUNTER — Encounter: Payer: Self-pay | Admitting: Internal Medicine

## 2013-05-23 DIAGNOSIS — H20019 Primary iridocyclitis, unspecified eye: Secondary | ICD-10-CM | POA: Diagnosis not present

## 2013-05-28 DIAGNOSIS — H353290 Exudative age-related macular degeneration, unspecified eye, stage unspecified: Secondary | ICD-10-CM | POA: Diagnosis not present

## 2013-05-28 DIAGNOSIS — H35059 Retinal neovascularization, unspecified, unspecified eye: Secondary | ICD-10-CM | POA: Diagnosis not present

## 2013-06-04 DIAGNOSIS — H353290 Exudative age-related macular degeneration, unspecified eye, stage unspecified: Secondary | ICD-10-CM | POA: Diagnosis not present

## 2013-06-04 DIAGNOSIS — H35059 Retinal neovascularization, unspecified, unspecified eye: Secondary | ICD-10-CM | POA: Diagnosis not present

## 2013-07-16 DIAGNOSIS — H35059 Retinal neovascularization, unspecified, unspecified eye: Secondary | ICD-10-CM | POA: Diagnosis not present

## 2013-07-16 DIAGNOSIS — H353290 Exudative age-related macular degeneration, unspecified eye, stage unspecified: Secondary | ICD-10-CM | POA: Diagnosis not present

## 2013-07-23 DIAGNOSIS — H35359 Cystoid macular degeneration, unspecified eye: Secondary | ICD-10-CM | POA: Diagnosis not present

## 2013-07-23 DIAGNOSIS — H35059 Retinal neovascularization, unspecified, unspecified eye: Secondary | ICD-10-CM | POA: Diagnosis not present

## 2013-07-23 DIAGNOSIS — H353290 Exudative age-related macular degeneration, unspecified eye, stage unspecified: Secondary | ICD-10-CM | POA: Diagnosis not present

## 2013-08-05 ENCOUNTER — Encounter: Payer: Self-pay | Admitting: *Deleted

## 2013-08-11 ENCOUNTER — Ambulatory Visit (INDEPENDENT_AMBULATORY_CARE_PROVIDER_SITE_OTHER): Payer: Medicare Other | Admitting: *Deleted

## 2013-08-11 DIAGNOSIS — I495 Sick sinus syndrome: Secondary | ICD-10-CM

## 2013-08-14 LAB — MDC_IDC_ENUM_SESS_TYPE_REMOTE
Battery Remaining Longevity: 108 mo
Battery Voltage: 2.79 V
Brady Statistic AP VP Percent: 86 %
Brady Statistic AS VP Percent: 13.8 %
Lead Channel Pacing Threshold Pulse Width: 0.4 ms
Lead Channel Pacing Threshold Pulse Width: 0.4 ms
Lead Channel Setting Pacing Amplitude: 2 V
Lead Channel Setting Pacing Pulse Width: 0.4 ms
Lead Channel Setting Sensing Sensitivity: 5.6 mV
MDC IDC MSMT LEADCHNL RA IMPEDANCE VALUE: 466 Ohm
MDC IDC MSMT LEADCHNL RA PACING THRESHOLD AMPLITUDE: 0.5 V
MDC IDC MSMT LEADCHNL RV IMPEDANCE VALUE: 678 Ohm
MDC IDC MSMT LEADCHNL RV PACING THRESHOLD AMPLITUDE: 0.625 V
MDC IDC SET LEADCHNL RV PACING AMPLITUDE: 2.5 V
MDC IDC STAT BRADY AP VS PERCENT: 0.1 %
MDC IDC STAT BRADY AS VS PERCENT: 0.1 %

## 2013-08-19 ENCOUNTER — Encounter: Payer: Self-pay | Admitting: *Deleted

## 2013-08-21 ENCOUNTER — Encounter: Payer: Self-pay | Admitting: Internal Medicine

## 2013-08-21 DIAGNOSIS — H4011X0 Primary open-angle glaucoma, stage unspecified: Secondary | ICD-10-CM | POA: Diagnosis not present

## 2013-08-21 DIAGNOSIS — T1510XA Foreign body in conjunctival sac, unspecified eye, initial encounter: Secondary | ICD-10-CM | POA: Diagnosis not present

## 2013-08-25 NOTE — Progress Notes (Signed)
PPM remote 

## 2013-08-29 ENCOUNTER — Encounter: Payer: Self-pay | Admitting: *Deleted

## 2013-09-02 ENCOUNTER — Non-Acute Institutional Stay: Payer: Medicare Other | Admitting: Internal Medicine

## 2013-09-02 ENCOUNTER — Encounter: Payer: Self-pay | Admitting: Internal Medicine

## 2013-09-02 VITALS — BP 122/58 | HR 68 | Wt 181.0 lb

## 2013-09-02 DIAGNOSIS — M25551 Pain in right hip: Secondary | ICD-10-CM | POA: Insufficient documentation

## 2013-09-02 DIAGNOSIS — M25559 Pain in unspecified hip: Secondary | ICD-10-CM | POA: Diagnosis not present

## 2013-09-02 NOTE — Progress Notes (Signed)
Patient ID: Caleb Nash, male   DOB: 1914/01/25, 80 y.o.   MRN: [id number]    Location:  [facility]  Place of Service: CLINIC    Allergies  Allergen Reactions  . Celebrex [Celecoxib] Other (See Comments)    unknown  . Sulfur Other (See Comments)    unknown  . Vioxx [Rofecoxib] Other (See Comments)    unknown    Chief Complaint  Patient presents with  . Hip Pain    right. ManXie wrote order today for PT to eval & treat, but patient wants Dr. Chilton SiGreen to check and get  x-ray    HPI:  Hip pain, right: seen acutely for right hip pain. Started yesterday when he was getting in bed. He felt something pop. Had immediate pain. Now able to bear weight, but painfully. Having mor difficulty walking and using walker. Denies pain in the back. Denies pain in the right greater trochanter area.   Medications: Patient's Medications  New Prescriptions   No medications on file  Previous Medications   AMOXICILLIN (AMOXIL) 500 MG CAPSULE    Take 2,000 mg by mouth daily as needed (prior to surgery).   ASPIRIN 81 MG TABLET    Take 81 mg by mouth daily.   BIMATOPROST (LUMIGAN) 0.01 % SOLN    Place 1 drop into both eyes at bedtime.    FINASTERIDE (PROSCAR) 5 MG TABLET    Take one tablet by mouth once daily   FOLIC ACID (FOLVITE) 1 MG TABLET    Take 1 mg by mouth daily.   FUROSEMIDE (LASIX) 20 MG TABLET    Take one tablet by mouth once daily. One twice weekly as needed for increased weight 2-3 lbs   GUAIFENESIN (MUCINEX) 600 MG 12 HR TABLET    Take one tablet twice daily as needed for congestion   KLOR-CON M10 10 MEQ TABLET    Take 10 mEq by mouth. Twice weekly as needed with Lasix   LISINOPRIL (PRINIVIL,ZESTRIL) 20 MG TABLET    Take 20 mg by mouth 2 (two) times daily.   NITROGLYCERIN (NITROSTAT) 0.4 MG SL TABLET    Place 0.4 mg under the tongue every 5 (five) minutes as needed. For chest pain   OMEPRAZOLE (PRILOSEC) 20 MG CAPSULE    Take 20 mg by mouth daily.   POLYETHYLENE GLYCOL (MIRALAX / GLYCOLAX)  PACKET    Take 17 g by mouth daily. Mix with 8 ounces of water or juice and take by mouth every other day for constipation.   SERTRALINE (ZOLOFT) 50 MG TABLET    Take 75 mg by mouth daily.    TAMSULOSIN HCL (FLOMAX) 0.4 MG CAPS    Take 0.4 mg by mouth daily.   TRAMADOL (ULTRAM) 50 MG TABLET    Take two tablets by mouth at bedtime for pain   ZOLPIDEM (AMBIEN) 5 MG TABLET    Take one tablet at bedtime as needed for insomnia  Modified Medications   No medications on file  Discontinued Medications   No medications on file     Review of Systems  Constitutional: Negative.  Negative for activity change, appetite change, fatigue and unexpected weight change.  HENT: Negative.  Negative for hearing loss, sinus pressure and sore throat.   Eyes:       Hx macular degeneration  Respiratory: Positive for cough and shortness of breath. Negative for apnea, chest tightness, wheezing and stridor.   Cardiovascular: Negative for chest pain, palpitations and leg swelling.  Gastrointestinal: Positive for  nausea. Negative for vomiting, abdominal pain and abdominal distention.  Endocrine: Negative.   Genitourinary: Negative for urgency, frequency and decreased urine volume.       BPH.  Musculoskeletal: Positive for gait problem (uunstable. uses walkeer). Negative for back pain, neck pain and neck stiffness.       Pain in the right hip  Skin: Negative.        Dry. Itching on dorsum of hands.  Allergic/Immunologic: Negative.   Neurological: Negative.   Hematological: Negative.   Psychiatric/Behavioral: Positive for sleep disturbance (iinsomnia. Ambien helps.).    Filed Vitals:   09/02/13 1050  BP: 122/58  Pulse: 68  Weight: 181 lb (82.101 kg)   Body mass index is 27.53 kg/(m^2).  Physical Exam  Constitutional: He is oriented to person, place, and time. He appears well-nourished. No distress.  Frail  HENT:  Head: Normocephalic and atraumatic.  Right Ear: External ear normal.  Left Ear: External  ear normal.  Nose: Nose normal.  Mouth/Throat: Oropharynx is clear and moist.  Eyes: Conjunctivae and EOM are normal. Pupils are equal, round, and reactive to light.  Neck: No JVD present. No tracheal deviation present. No thyromegaly present.  Cardiovascular: Normal rate, regular rhythm and intact distal pulses.  Exam reveals friction rub. Exam reveals no gallop.   No murmur heard. Pulmonary/Chest: Effort normal and breath sounds normal. No respiratory distress. He has no wheezes. He has no rales.  Abdominal: He exhibits no distension and no mass. There is no tenderness.  Musculoskeletal: Normal range of motion. He exhibits no edema and no tenderness.  Using walker. Pain in the right hip. Worse with flexion and abduction movement.  Lymphadenopathy:    He has no cervical adenopathy.  Neurological: He is alert and oriented to person, place, and time. He has normal reflexes. No cranial nerve deficit. Coordination normal.  Skin: Skin is dry. No rash noted. No erythema. No pallor.  Psychiatric: He has a normal mood and affect. His behavior is normal. Judgment and thought content normal.     Labs reviewed: Clinical Support on 08/11/2013  Component Date Value Ref Range Status  . Pulse Generator Manufacturer 08/14/2013 Medtronic   Final  . Pulse Gen Model 08/14/2013 SEDR01 Sensia   Final  . Pulse Gen Serial Number 08/14/2013 [id number]   Final  . RV Sense Sensitivity 08/14/2013 5.6   Final  . RA Pace Amplitude 08/14/2013 2   Final  . RV Pace PulseWidth 08/14/2013 0.4   Final  . RV Pace Amplitude 08/14/2013 2.5   Final  . RA Impedance 08/14/2013 466   Final  . RA Pacing Amplitude 08/14/2013 0.5   Final  . RA Pacing PulseWidth 08/14/2013 0.4   Final  . RV IMPEDANCE 08/14/2013 678   Final  . RV Pacing Amplitude 08/14/2013 0.625   Final  . RV Pacing PulseWidth 08/14/2013 0.4   Final  . Battery Longevity 08/14/2013 108   Final  . Battery Voltage 08/14/2013 2.79   Final  . Brady AP VP  Percent 08/14/2013 86   Final  . Huston Foley AS VP Percent 08/14/2013 13.8   Final  . Brady AP VS Percent 08/14/2013 0.1   Final  . Huston Foley AS VS Percent 08/14/2013 0.1   Final  . Eval Rhythm 08/14/2013 Ap-vp   Final  . Miscellaneous Comment 08/14/2013    Final                   Value:Pacemaker remote check. Device function reviewed. Impedance,  sensing, auto capture thresholds consistent with previous measurements. Histograms appropriate for patient and level of activity. All other diagnostic data reviewed and is appropriate and                          stable for patient. Real time/magnet EGM shows appropriate sensing and capture. 5 mode switches, <0.1%.  No ventricular high rate episodes. Estimated longevity 9 years. Plan to follow in 3 months remotely, to see in office annually.  ROV in July with Dr.                          Johney FrameAllred.      Assessment/Plan  Hip pain, right Plan: Portable xray of the right hip  Aleve 220 mg ac breakfast, lunch, and supper.

## 2013-09-05 DIAGNOSIS — M25569 Pain in unspecified knee: Secondary | ICD-10-CM | POA: Diagnosis not present

## 2013-09-09 ENCOUNTER — Encounter: Payer: Self-pay | Admitting: Internal Medicine

## 2013-09-09 ENCOUNTER — Non-Acute Institutional Stay: Payer: Medicare Other | Admitting: Internal Medicine

## 2013-09-09 VITALS — BP 130/72 | HR 66 | Temp 97.4°F | Resp 18 | Wt 182.0 lb

## 2013-09-09 DIAGNOSIS — M25559 Pain in unspecified hip: Secondary | ICD-10-CM

## 2013-09-09 DIAGNOSIS — G4700 Insomnia, unspecified: Secondary | ICD-10-CM

## 2013-09-09 DIAGNOSIS — M1990 Unspecified osteoarthritis, unspecified site: Secondary | ICD-10-CM

## 2013-09-09 DIAGNOSIS — F329 Major depressive disorder, single episode, unspecified: Secondary | ICD-10-CM | POA: Diagnosis not present

## 2013-09-09 DIAGNOSIS — K5900 Constipation, unspecified: Secondary | ICD-10-CM

## 2013-09-09 DIAGNOSIS — I2510 Atherosclerotic heart disease of native coronary artery without angina pectoris: Secondary | ICD-10-CM

## 2013-09-09 DIAGNOSIS — I509 Heart failure, unspecified: Secondary | ICD-10-CM

## 2013-09-09 DIAGNOSIS — M25551 Pain in right hip: Secondary | ICD-10-CM

## 2013-09-09 DIAGNOSIS — F3289 Other specified depressive episodes: Secondary | ICD-10-CM

## 2013-09-09 DIAGNOSIS — R1310 Dysphagia, unspecified: Secondary | ICD-10-CM | POA: Diagnosis not present

## 2013-09-09 DIAGNOSIS — M545 Low back pain, unspecified: Secondary | ICD-10-CM

## 2013-09-09 DIAGNOSIS — I10 Essential (primary) hypertension: Secondary | ICD-10-CM

## 2013-09-09 DIAGNOSIS — F32A Depression, unspecified: Secondary | ICD-10-CM

## 2013-09-09 DIAGNOSIS — M25569 Pain in unspecified knee: Secondary | ICD-10-CM | POA: Diagnosis not present

## 2013-09-09 MED ORDER — SERTRALINE HCL 50 MG PO TABS: ORAL_TABLET | ORAL | Status: DC

## 2013-09-09 NOTE — Progress Notes (Signed)
Patient ID: Caleb Nash, male   DOB: 30-Jul-1913, 80 y.o.   MRN: [id number]    Location:  [facility]   Place of Service: Clinic (12)  PCP: Kimber RelicGREEN, Milon G, MD  Code Status: DNR  Extended Emergency Contact Information Primary Emergency Contact: Mortimore,Janet Address: [address]          [place], [place] [id number] [place] Home Phone: [phone] Mobile Phone: [phone] Relation: Daughter  Allergies  Allergen Reactions  . Celebrex [Celecoxib]     Lips swelled  . Sulfur     Lips swelled  . Vioxx [Rofecoxib] Other (See Comments)    unknown    Chief Complaint  Patient presents with  . Annual Exam    HPI:  Dysphagia, unspecified(787.20): increasing problem with choking while swallowing liquids or sloids. Coughs at meals. On 05/03/11 he had MBSS which confirmed mild dysphagia. He had some instruction in chin tuck and other maneuvers to assist in swallowing without choking. He does not regularly practice this at meals.he does not consider this a severe problem.  Hip pain, right: improved since his last visit. Did not Get the Aleve I recommended because the pharmacy noted "allergy " to Celebrex and Vioxx.   CAD (coronary artery disease): denies chest pain or increasing fatigue.  Depression: stable Denies depression. Currently on sertraline 75 mg qd.  CHF (congestive heart failure): cmpensated  Essential hypertension: controlled  Insomnia: benefits from zolpidem  Unspecified constipation: corrected on current laxative  Osteoarthritis: generalized. Low back pains are the worst area.  Lumbago; chronic. He has adapted. Using a walker to help pains and for unstable gait.      Past Medical History  Diagnosis Date  . Osteoarthritis   . HTN (hypertension)   . BPH (benign prostatic hyperplasia)   . Sick sinus syndrome 10/14/2003; 10/29/2012    MDT EnPulse implanted by Dr Amil AmenEdmunds for SSS; generator change 10/29/2012 by Dr Johney FrameAllred MDT Jana HalfSensia  pacemaker  . CAD (coronary artery disease)     s/p CABG 2005  . Macular degeneration   . Hyperlipidemia   . Depression     mild per pt  . Sporotrichosis     remote  . History of rhabdomyolysis   . Ingrowing nail 06/11/2012  . Abnormality of gait 06/11/2012  . Major depressive disorder, single episode, unspecified 04/28/2012  . Unspecified glaucoma 04/28/2012  . Coronary atherosclerosis of native coronary artery 04/28/2012  . Atrial fibrillation 04/28/2012  . Congestive heart failure, unspecified 04/28/2012  . Reflux esophagitis 04/28/2012  . Hypertrophy of prostate without urinary obstruction and other lower urinary tract symptoms (LUTS) 04/28/2012  . Insomnia, unspecified 04/28/2012  . Edema 04/28/2012  . Allergy, unspecified not elsewhere classified 04/28/2012  . Personal history of fall 04/28/2012  . Cardiac pacemaker in situ 04/28/2012  . Other specified disease of white blood cells 04/26/2012  . Alcohol abuse 04/26/2012  . Chronic airway obstruction, not elsewhere classified 04/26/2012  . Muscle weakness (generalized) 04/26/2012  . Rhabdomyolysis   . CHF (congestive heart failure) 04/23/2012  . Degenerative joint disease 04/21/2012  . Essential hypertension 10/17/2012  . Fall 04/21/2012    Prolonged downtime 04/20/2012   . GERD (gastroesophageal reflux disease) 09/13/2012  . Hip pain, right 09/02/2013  . Insomnia 11/15/2012  . NSTEMI (non-ST elevated myocardial infarction) 04/23/2012    Related to the stress of rhabdomyolysis and prolonged downtime prior to admission in a patient with known coronary artery disease   . Pacemaker 04/21/2012  Near EOL.   . Paroxysmal atrial tachycardia 04/21/2012    Brief episodes. Not felt to be an anticoagulation candidate because of age and frailty   . Pericarditis 04/21/2012  . Seborrheic eczema 12/03/2012  . Tachy-brady syndrome 04/21/2012    Pacemaker is near EOL   . Unspecified constipation 09/13/2012  . Lumbago     Past Surgical History  Procedure Laterality  Date  . Pacemaker insertion  10/14/2003;10/29/2012    MDT Implanted by Dr Amil Amen for SSS; generator change 10/29/2012 by Dr Johney Frame Medtronic Rattan  . Coronary artery bypass graft  2005  . Bilateral knee arthroplasty  09/28/83; 05/17/1992    right then left  . Appendectomy    . Yag laser application Right 12/26/1973    eye  . Hernia repair Right 05/19/1976  . Transurethral resection of prostate  07/27/1983  . Cystoscopy  08/14/85  . Epididymis surgery  09/17/1985  . Angioplasty  9/11/190    Dr. Katrinka Blazing  . Replacement total knee bilateral  1992 and 1996  . Cataract extraction Right 10/30/1996  . Shoulder open rotator cuff repair Left 02/07/1996  . Back surgery  03/11/2001  . Yag laser application Right 04/30/2001  . Yag laser application Left 05/07/2001  . Toe surgery Right 2005    correction  . Carpal tunnel release Left   . Elbow surgery      repair  . Carpal tunnel release Right 2007  . Rotator cuff repair Right 2008  . Sigmoidoscopy  08/26/2001  . Pacemaker generator change  10/29/12    Dr. Ursula Beath Dr. Katrinka Blazing Cardiology # [phone]  Dr. Pete Glatter PCP [phone]  Dr. Isabel Caprice Urology [phone]  Dr. Corliss Marcus Cardio-pacemaker # [id number]  Dr. Londell Moh Dermatology  Dr. Emily Filbert ophthalmology Dr. Johney Frame: cardiology Dr. Dorris Fetch: cardia surgery Dr Simonne Come: ortho Dr. Teressa Senter; Ortho   PAST PROCEDURES 03/30/88 bone scan  01/01/89 cardiac catheterization 09/12/07 nerve conduction study 10/15/07 bone scan.  11/01/07 MRI spine w/cm, myelogram lumbar.  08/01/00 nerve conduction study 08/26/01 sigmoidoscopy.  2005 echocardiogram: EF 45-50% 04/21/12 CXR improved aeration with resolution of left retrocardiac atelectasis. COPD. No acute findings.  X-ray R elbow: mild soft tissue swelling without fracture.  CT head w/o CM: no evidence of facial/orbital fracture.  CXR low lung volumes with mild bibasilar air space disease, left greater than right, possible due to atelectasis. Aspiration and  pneumonia are not excluded.  X-ray L foot: although no obvious fracture identified, the toes are overlapping and phlangeal fractures are not excluded. Considered dedicated digit view as needed.  X-ray R foot: no evidence for acute fracture. Suspicious lytic lesion involving the distal phalanx of the second digit.  CT maxillofacial w/o CM: no acute intracranial abnormality. Atrophy, chronic microvascular disease. No evidence of facial or orbital fracture. Paranasal sinuses are clear. Orbital soft tissues unremarkable. No evidence of facial/orbital fracture.  04/22/12 2 D echocardiogram: EF 45-50%, mild aortic and mitral valve regurgitation 04/23/12 CXR recurrent left lower lobe infiltrate/atelectasis 05/03/11 MBSS: mild dysphagia with penetration of liquids and solids.   Social History: History   Social History  . Marital Status: Single    Spouse Name: N/A    Number of Children: N/A  . Years of Education: N/A   Occupational History  . retired IT trainer    Social History Main Topics  . Smoking status: Never Smoker   . Smokeless tobacco: Never Used  . Alcohol Use: No  . Drug Use: No  . Sexual Activity: No   Other  Topics Concern  . None   Social History Narrative   Pt lives in assisted living at [facility] following a fall 12/13.   Retired IT trainer.   Widowed   No siblings    MOST  Form signed   DNR    Family History Family Status  Relation Status Death Age  . Mother Deceased 43  . Father Deceased 56  . Daughter Alive    Family History  Problem Relation Age of Onset  . Cancer    . CVA Mother   . CVA Father      Medications: Patient's Medications  New Prescriptions   No medications on file  Previous Medications   AMOXICILLIN (AMOXIL) 500 MG CAPSULE    Take 2,000 mg by mouth daily as needed (prior to surgery).   ASPIRIN 81 MG TABLET    Take 81 mg by mouth daily.   BIMATOPROST (LUMIGAN) 0.01 % SOLN    Place 1 drop into both eyes at bedtime.    DICLOFENAC SODIUM  (VOLTAREN) 1 % GEL    2 g 2 (two) times daily.   FINASTERIDE (PROSCAR) 5 MG TABLET    Take one tablet by mouth once daily   FOLIC ACID (FOLVITE) 1 MG TABLET    Take 1 mg by mouth daily.   FUROSEMIDE (LASIX) 20 MG TABLET    Take one tablet by mouth once daily. One twice weekly as needed for increased weight 2-3 lbs   GUAIFENESIN (MUCINEX) 600 MG 12 HR TABLET    Take one tablet twice daily as needed for congestion   KLOR-CON M10 10 MEQ TABLET    Take 10 mEq by mouth. Twice weekly as needed with Lasix   LISINOPRIL (PRINIVIL,ZESTRIL) 20 MG TABLET    Take 20 mg by mouth 2 (two) times daily.   NITROGLYCERIN (NITROSTAT) 0.4 MG SL TABLET    Place 0.4 mg under the tongue every 5 (five) minutes as needed. For chest pain   OMEPRAZOLE (PRILOSEC) 20 MG CAPSULE    Take 20 mg by mouth daily.   POLYETHYLENE GLYCOL (MIRALAX / GLYCOLAX) PACKET    Take 17 g by mouth daily. Mix with 8 ounces of water or juice and take by mouth every other day for constipation.   SERTRALINE (ZOLOFT) 50 MG TABLET    Take 75 mg by mouth daily.    TAMSULOSIN HCL (FLOMAX) 0.4 MG CAPS    Take 0.4 mg by mouth daily.   TRAMADOL (ULTRAM) 50 MG TABLET    Take two tablets by mouth at bedtime for pain   ZOLPIDEM (AMBIEN) 5 MG TABLET    Take one tablet at bedtime as needed for insomnia  Modified Medications   No medications on file  Discontinued Medications   No medications on file    Immunization History  Administered Date(s) Administered  . Influenza Whole 02/02/2013  . Pneumococcal Polysaccharide-23 04/24/2009  . Tdap 04/21/2012  . Zoster 04/25/2007     Review of Systems  Constitutional: Negative.  Negative for activity change, appetite change, fatigue and unexpected weight change.  HENT: Negative.  Negative for hearing loss, sinus pressure and sore throat.   Eyes:       Hx macular degeneration. Receiving injections every 6-8 weeks.  Respiratory: Positive for cough and shortness of breath. Negative for apnea, chest tightness,  wheezing and stridor.   Cardiovascular: Positive for leg swelling. Negative for chest pain and palpitations.  Gastrointestinal: Positive for nausea. Negative for vomiting, abdominal pain and abdominal  distention.  Endocrine: Negative.   Genitourinary: Negative for urgency, frequency and decreased urine volume.       BPH.  Musculoskeletal: Positive for gait problem (uunstable. uses walker). Negative for back pain, neck pain and neck stiffness.       Pain in the right hip  Skin: Negative.        Dry. Itching on dorsum of hands.  Allergic/Immunologic: Negative.   Neurological: Negative.   Hematological: Negative.   Psychiatric/Behavioral: Positive for sleep disturbance (iinsomnia. Ambien helps.).      Filed Vitals:   09/09/13 1006  BP: 130/72  Pulse: 66  Temp: 97.4 F (36.3 C)  TempSrc: Oral  Resp: 18  Weight: 182 lb (82.555 kg)   Body mass index is 27.68 kg/(m^2).  Physical Exam  Constitutional: He is oriented to person, place, and time. He appears well-nourished. No distress.  Frail  HENT:  Head: Normocephalic and atraumatic.  Right Ear: External ear normal.  Left Ear: External ear normal.  Nose: Nose normal.  Mouth/Throat: Oropharynx is clear and moist.  Mild decrease in hearing  Eyes: Conjunctivae and EOM are normal. Pupils are equal, round, and reactive to light.  Neck: No JVD present. No tracheal deviation present. No thyromegaly present.  Cardiovascular: Normal rate, regular rhythm and intact distal pulses.  Exam reveals friction rub. Exam reveals no gallop.   No murmur heard. Pulmonary/Chest: Effort normal and breath sounds normal. No respiratory distress. He has no wheezes. He has no rales.  Abdominal: He exhibits no distension and no mass. There is no tenderness.  Large upper diastasis recti  Musculoskeletal: Normal range of motion. He exhibits edema (1+ bipedal). He exhibits no tenderness.  Using walker. Pain in the right hip. Worse with flexion and abduction  movement. Bilaateral knee prosthesis.  Lymphadenopathy:    He has no cervical adenopathy.  Neurological: He is alert and oriented to person, place, and time. He has normal reflexes. No cranial nerve deficit. Coordination normal.  Diminished sensation to vibration in the right foot.  Skin: Skin is dry. No rash noted. No erythema. No pallor.  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 Yellow and Demeisha Geraghty bruise at right lowwer lateral rib cage.  Multiple SK.  Psychiatric: He has a normal mood and affect. His behavior is normal. Judgment and thought content normal.        Labs reviewed: Clinical Support on 08/11/2013  Component Date Value Ref Range Status  . Pulse Generator Manufacturer 08/14/2013 Medtronic   Final  . Pulse Gen Model 08/14/2013 SEDR01 Sensia   Final  . Pulse Gen Serial Number 08/14/2013 [id number]   Final  . RV Sense Sensitivity 08/14/2013 5.6   Final  . RA Pace Amplitude 08/14/2013 2   Final  . RV Pace PulseWidth 08/14/2013 0.4   Final  . RV Pace Amplitude 08/14/2013 2.5   Final  . RA Impedance 08/14/2013 466   Final  . RA Pacing Amplitude 08/14/2013 0.5   Final  . RA Pacing PulseWidth 08/14/2013 0.4   Final  . RV IMPEDANCE 08/14/2013 678   Final  . RV Pacing Amplitude 08/14/2013 0.625   Final  . RV Pacing PulseWidth 08/14/2013 0.4   Final  . Battery Longevity 08/14/2013 108   Final  . Battery Voltage 08/14/2013 2.79   Final  . Brady AP VP Percent 08/14/2013 86   Final  . Huston Foley AS VP Percent 08/14/2013 13.8   Final  . Huston Foley AP  VS Percent 08/14/2013 0.1   Final  . Huston FoleyBrady AS VS Percent 08/14/2013 0.1   Final  . Eval Rhythm 08/14/2013 Ap-vp   Final  . Miscellaneous Comment 08/14/2013    Final                   Value:Pacemaker remote check. Device function reviewed. Impedance, sensing, auto capture thresholds consistent with previous measurements. Histograms appropriate for patient and level of activity. All other diagnostic  data reviewed and is appropriate and                          stable for patient. Real time/magnet EGM shows appropriate sensing and capture. 5 mode switches, <0.1%.  No ventricular high rate episodes. Estimated longevity 9 years. Plan to follow in 3 months remotely, to see in office annually.  ROV in July with Dr.                          Johney FrameAllred.     Assessment/Plan 1. Dysphagia, unspecified(787.20) Chronic condition that may be a little worse recently. Discussed GI referral and Speech Therapy referral. He does not feel these are necessary at this time.  2. Hip pain, right impoving  3. CAD (coronary artery disease) stable  4. Depression Improved. Will reduce sertraline. Consider further reductions in the future. - sertraline (ZOLOFT) 50 MG tablet; One daily to help depression  Dispense: 30 tablet; Refill: 5  5. CHF (congestive heart failure) compensated  6. Essential hypertension controlled  7. Insomnia continue zolpidem prn  8. Unspecified constipation Doing OK on current medications  9. Osteoarthritis stable  10. Lumbago Stable. Has tramadol to use prn.

## 2013-09-10 DIAGNOSIS — H35059 Retinal neovascularization, unspecified, unspecified eye: Secondary | ICD-10-CM | POA: Diagnosis not present

## 2013-09-10 DIAGNOSIS — H353290 Exudative age-related macular degeneration, unspecified eye, stage unspecified: Secondary | ICD-10-CM | POA: Diagnosis not present

## 2013-09-11 DIAGNOSIS — M25569 Pain in unspecified knee: Secondary | ICD-10-CM | POA: Diagnosis not present

## 2013-09-15 DIAGNOSIS — M25569 Pain in unspecified knee: Secondary | ICD-10-CM | POA: Diagnosis not present

## 2013-09-17 DIAGNOSIS — M25569 Pain in unspecified knee: Secondary | ICD-10-CM | POA: Diagnosis not present

## 2013-09-17 DIAGNOSIS — H353290 Exudative age-related macular degeneration, unspecified eye, stage unspecified: Secondary | ICD-10-CM | POA: Diagnosis not present

## 2013-09-17 DIAGNOSIS — H35059 Retinal neovascularization, unspecified, unspecified eye: Secondary | ICD-10-CM | POA: Diagnosis not present

## 2013-09-19 DIAGNOSIS — M25569 Pain in unspecified knee: Secondary | ICD-10-CM | POA: Diagnosis not present

## 2013-09-22 ENCOUNTER — Other Ambulatory Visit: Payer: Self-pay | Admitting: *Deleted

## 2013-09-22 DIAGNOSIS — M25519 Pain in unspecified shoulder: Secondary | ICD-10-CM | POA: Diagnosis not present

## 2013-09-22 DIAGNOSIS — M25569 Pain in unspecified knee: Secondary | ICD-10-CM | POA: Diagnosis not present

## 2013-09-22 MED ORDER — TRAMADOL HCL 50 MG PO TABS: ORAL_TABLET | ORAL | Status: DC

## 2013-09-22 NOTE — Telephone Encounter (Signed)
[facility] 

## 2013-09-24 ENCOUNTER — Encounter: Payer: Self-pay | Admitting: Internal Medicine

## 2013-10-20 DIAGNOSIS — I10 Essential (primary) hypertension: Secondary | ICD-10-CM | POA: Diagnosis not present

## 2013-10-20 LAB — BASIC METABOLIC PANEL
BUN: 36 mg/dL — AB (ref 4–21)
Creatinine: 1.3 mg/dL (ref 0.6–1.3)
Glucose: 93 mg/dL
Potassium: 4.4 mmol/L (ref 3.4–5.3)
SODIUM: 135 mmol/L — AB (ref 137–147)

## 2013-10-21 ENCOUNTER — Non-Acute Institutional Stay: Payer: Medicare Other | Admitting: Nurse Practitioner

## 2013-10-21 ENCOUNTER — Encounter: Payer: Self-pay | Admitting: Nurse Practitioner

## 2013-10-21 DIAGNOSIS — G2581 Restless legs syndrome: Secondary | ICD-10-CM | POA: Diagnosis not present

## 2013-10-21 DIAGNOSIS — I5022 Chronic systolic (congestive) heart failure: Secondary | ICD-10-CM

## 2013-10-21 DIAGNOSIS — G4700 Insomnia, unspecified: Secondary | ICD-10-CM

## 2013-10-21 DIAGNOSIS — F329 Major depressive disorder, single episode, unspecified: Secondary | ICD-10-CM | POA: Diagnosis not present

## 2013-10-21 DIAGNOSIS — N400 Enlarged prostate without lower urinary tract symptoms: Secondary | ICD-10-CM

## 2013-10-21 DIAGNOSIS — K219 Gastro-esophageal reflux disease without esophagitis: Secondary | ICD-10-CM

## 2013-10-21 DIAGNOSIS — F32A Depression, unspecified: Secondary | ICD-10-CM

## 2013-10-21 DIAGNOSIS — K5900 Constipation, unspecified: Secondary | ICD-10-CM

## 2013-10-21 DIAGNOSIS — M545 Low back pain, unspecified: Secondary | ICD-10-CM | POA: Diagnosis not present

## 2013-10-21 DIAGNOSIS — M25551 Pain in right hip: Secondary | ICD-10-CM

## 2013-10-21 DIAGNOSIS — M25559 Pain in unspecified hip: Secondary | ICD-10-CM

## 2013-10-21 DIAGNOSIS — F3289 Other specified depressive episodes: Secondary | ICD-10-CM

## 2013-10-21 DIAGNOSIS — I509 Heart failure, unspecified: Secondary | ICD-10-CM

## 2013-10-21 DIAGNOSIS — I10 Essential (primary) hypertension: Secondary | ICD-10-CM

## 2013-10-21 NOTE — Assessment & Plan Note (Signed)
Takes Ambien 2.5mg  nightly and Sertraline 50mg . Sleeps well at night.

## 2013-10-21 NOTE — Assessment & Plan Note (Signed)
Lower back-takes Tramadol 100mg  qhs-able to sleep at night.

## 2013-10-21 NOTE — Assessment & Plan Note (Signed)
Tamsulosin 0.4mg and Finasteride 5mg daily. Better.  

## 2013-10-21 NOTE — Assessment & Plan Note (Signed)
Stable with Sertraline 50mg qhs. Will observe the patient.   

## 2013-10-21 NOTE — Assessment & Plan Note (Signed)
Observed by staff. It doesn't interfere his sleep at night. The patient desires no pharmaceutical tx.

## 2013-10-21 NOTE — Progress Notes (Signed)
Patient ID: Caleb Nash, male   DOB: 07-29-1913, 80 y.o.   MRN: [id number]   Code Status: DNR  Allergies  Allergen Reactions  . Celebrex [Celecoxib]     Lips swelled  . Sulfur     Lips swelled  . Vioxx [Rofecoxib] Other (See Comments)    unknown    Chief Complaint  Patient presents with  . Medical Management of Chronic Issues  . Acute Visit    positional right groin pain.     HPI: Patient is a 80 y.o. male seen in the AL at [facility] today for  evaluation of positional pain in the right groin area and other chronic medical conditions Problem List Items Addressed This Visit   Depression (Chronic)     Stable with Sertraline 50mg  qhs. Will observe the patient.     BPH (benign prostatic hyperplasia)     Tamsulosin 0.4mg  and Finasteride 5mg  daily. Better.        CHF (congestive heart failure)     Compensated, takes Furosemide 20mg  daily. 10/20/13 Bun/creat 36/1.26      Unspecified constipation     Stable, takes MiraLax qod     GERD (gastroesophageal reflux disease)     Stable, takes Omeprazole 20mg  daily.      Essential hypertension     Controlled on Lisinopril 20mg .        Insomnia     Takes Ambien 2.5mg  nightly and Sertraline 50mg . Sleeps well at night.       Hip pain, right     Pain in his right groin area-positional when he gets in car-better now. X-ray 09/02/13 R hip: no acute bony abnormalities right hip. Staff reported the patient was observed to have restless leg especially RLE which may contribute to his right groin pain. The patient declined medication for restless leg. He desires watchful waiting. Continue Tramadol 100mg  nightly.     Lumbago - Primary     Lower back-takes Tramadol 100mg  qhs-able to sleep at night.     Restless leg     Observed by staff. It doesn't interfere his sleep at night. The patient desires no pharmaceutical tx.        Review of Systems:  Review of Systems  Constitutional: Negative for fever, chills, weight  loss and malaise/fatigue.       Gradual weight gain about #10Ibs in the past year  HENT: Positive for congestion and hearing loss. Negative for ear discharge and sore throat.   Eyes: Negative for blurred vision, double vision and photophobia.       Macular degeneration OU--f/u Ophthalmology, 2 eye drops. Had inj >20x in the past per patient.   Respiratory: Positive for cough. Negative for sputum production (clear mucous), shortness of breath and wheezing.        Chronic hacking cough  Cardiovascular: Positive for palpitations. Negative for chest pain, orthopnea, claudication, leg swelling and PND.  Gastrointestinal: Negative for heartburn, nausea, vomiting, abdominal pain, diarrhea, constipation and blood in stool.  Genitourinary: Positive for frequency. Negative for dysuria, urgency, hematuria and flank pain.  Musculoskeletal: Positive for back pain and joint pain (left shoudler and toes pain-managed with Tramadol). Negative for falls, myalgias and neck pain.       Positional pain in the right groin area for about 6 weeks-better  Skin: Negative for itching and rash.  Neurological: Negative for dizziness, tingling, tremors, sensory change, speech change, focal weakness, seizures, loss of consciousness, weakness and headaches.  Staff reported restless leg R>L in his sleep.   Endo/Heme/Allergies: Negative for environmental allergies and polydipsia. Does not bruise/bleed easily.  Psychiatric/Behavioral: Positive for depression. Negative for hallucinations, memory loss and substance abuse. The patient has insomnia. The patient is not nervous/anxious.        Better-Zoloft 50mg  and Ambien 2.5mg       Past Medical History  Diagnosis Date  . Osteoarthritis   . HTN (hypertension)   . BPH (benign prostatic hyperplasia)   . Sick sinus syndrome 10/14/2003; 10/29/2012    MDT EnPulse implanted by Dr Amil AmenEdmunds for SSS; generator change 10/29/2012 by Dr Johney FrameAllred MDT Jana HalfSensia pacemaker  . CAD (coronary artery  disease)     s/p CABG 2005  . Macular degeneration   . Hyperlipidemia   . Depression     mild per pt  . Sporotrichosis     remote  . History of rhabdomyolysis   . Ingrowing nail 06/11/2012  . Abnormality of gait 06/11/2012  . Major depressive disorder, single episode, unspecified 04/28/2012  . Unspecified glaucoma 04/28/2012  . Coronary atherosclerosis of native coronary artery 04/28/2012  . Atrial fibrillation 04/28/2012  . Congestive heart failure, unspecified 04/28/2012  . Reflux esophagitis 04/28/2012  . Hypertrophy of prostate without urinary obstruction and other lower urinary tract symptoms (LUTS) 04/28/2012  . Insomnia, unspecified 04/28/2012  . Edema 04/28/2012  . Allergy, unspecified not elsewhere classified 04/28/2012  . Personal history of fall 04/28/2012  . Cardiac pacemaker in situ 04/28/2012  . Other specified disease of white blood cells 04/26/2012  . Alcohol abuse 04/26/2012  . Chronic airway obstruction, not elsewhere classified 04/26/2012  . Muscle weakness (generalized) 04/26/2012  . Rhabdomyolysis   . CHF (congestive heart failure) 04/23/2012  . Degenerative joint disease 04/21/2012  . Essential hypertension 10/17/2012  . Fall 04/21/2012    Prolonged downtime 04/20/2012   . GERD (gastroesophageal reflux disease) 09/13/2012  . Hip pain, right 09/02/2013  . Insomnia 11/15/2012  . NSTEMI (non-ST elevated myocardial infarction) 04/23/2012    Related to the stress of rhabdomyolysis and prolonged downtime prior to admission in a patient with known coronary artery disease   . Pacemaker 04/21/2012    Near EOL.   . Paroxysmal atrial tachycardia 04/21/2012    Brief episodes. Not felt to be an anticoagulation candidate because of age and frailty   . Pericarditis 04/21/2012  . Seborrheic eczema 12/03/2012  . Tachy-brady syndrome 04/21/2012    Pacemaker is near EOL   . Unspecified constipation 09/13/2012  . Lumbago    Past Surgical History  Procedure Laterality Date  . Pacemaker insertion   10/14/2003;10/29/2012    MDT Implanted by Dr Amil AmenEdmunds for SSS; generator change 10/29/2012 by Dr Johney FrameAllred Medtronic [employer]ensia  . Coronary artery bypass graft  2005  . Bilateral knee arthroplasty  09/28/83; 05/17/1992    right then left  . Appendectomy    . Yag laser application Right 12/26/1973    eye  . Hernia repair Right 05/19/1976  . Transurethral resection of prostate  07/27/1983  . Cystoscopy  08/14/85  . Epididymis surgery  09/17/1985  . Angioplasty  9/11/190    Dr. Katrinka BlazingSmith  . Replacement total knee bilateral  1992 and 1996  . Cataract extraction Right 10/30/1996  . Shoulder open rotator cuff repair Left 02/07/1996  . Back surgery  03/11/2001  . Yag laser application Right 04/30/2001  . Yag laser application Left 05/07/2001  . Toe surgery Right 2005    correction  . Carpal tunnel release Left   .  Elbow surgery      repair  . Carpal tunnel release Right 2007  . Rotator cuff repair Right 2008  . Sigmoidoscopy  08/26/2001  . Pacemaker generator change  10/29/12    Dr. Johney Frame   Social History:   reports that he has never smoked. He has never used smokeless tobacco. He reports that he does not drink alcohol or use illicit drugs.  Family History  Problem Relation Age of Onset  . Cancer    . CVA Mother   . CVA Father     Medications: Patient's Medications  New Prescriptions   No medications on file  Previous Medications   AMOXICILLIN (AMOXIL) 500 MG CAPSULE    Take 2,000 mg by mouth daily as needed (prior to surgery).   ASPIRIN 81 MG TABLET    Take 81 mg by mouth daily.   BIMATOPROST (LUMIGAN) 0.01 % SOLN    Place 1 drop into both eyes at bedtime.    FINASTERIDE (PROSCAR) 5 MG TABLET    Take one tablet by mouth once daily   FUROSEMIDE (LASIX) 20 MG TABLET    Take one tablet by mouth once daily. One twice weekly as needed for increased weight 2-3 lbs   GUAIFENESIN (MUCINEX) 600 MG 12 HR TABLET    Take one tablet twice daily as needed for congestion   KLOR-CON M10 10 MEQ TABLET    Take 10 mEq by  mouth. Twice weekly as needed with Lasix   LISINOPRIL (PRINIVIL,ZESTRIL) 20 MG TABLET    Take 20 mg by mouth 2 (two) times daily.   NITROGLYCERIN (NITROSTAT) 0.4 MG SL TABLET    Place 0.4 mg under the tongue every 5 (five) minutes as needed. For chest pain   OMEPRAZOLE (PRILOSEC) 20 MG CAPSULE    Take 20 mg by mouth daily.   POLYETHYLENE GLYCOL (MIRALAX / GLYCOLAX) PACKET    Take 17 g by mouth daily. Mix with 8 ounces of water or juice and take by mouth every other day for constipation.   SERTRALINE (ZOLOFT) 50 MG TABLET    One daily to help depression   TAMSULOSIN HCL (FLOMAX) 0.4 MG CAPS    Take 0.4 mg by mouth daily.   TRAMADOL (ULTRAM) 50 MG TABLET    Take two tablets by mouth at bedtime for pain   ZOLPIDEM (AMBIEN) 5 MG TABLET    Take one tablet at bedtime as needed for insomnia  Modified Medications   No medications on file  Discontinued Medications   No medications on file     Physical Exam: Physical Exam  Constitutional: He is oriented to person, place, and time. He appears well-developed and well-nourished. No distress.  HENT:  Head: Normocephalic and atraumatic.  Right Ear: External ear normal.  Left Ear: External ear normal.  Mouth/Throat: No oropharyngeal exudate.  Mild throat erythema.   Eyes: Conjunctivae and EOM are normal. Pupils are equal, round, and reactive to light. No scleral icterus.  Neck: Normal range of motion. Neck supple. No JVD present. No tracheal deviation present. No thyromegaly present.  Cardiovascular: Normal rate, regular rhythm and normal heart sounds.   No murmur heard. Pulmonary/Chest: Effort normal. No respiratory distress. He has decreased breath sounds. He has no wheezes. He has rales in the right lower field and the left lower field.  Abdominal: He exhibits no distension. There is no tenderness. There is no rebound.  Musculoskeletal: He exhibits tenderness. He exhibits no edema.  Slightly decreased ROM of the left shoulder  with pain. Right  groin pain. Lower back pain. Takes Tramadol 100mg  nightly.   Lymphadenopathy:    He has no cervical adenopathy.  Neurological: He is alert and oriented to person, place, and time. He has normal reflexes. No cranial nerve deficit. He exhibits normal muscle tone. Coordination normal.  Skin: Skin is warm and dry. No rash noted. He is not diaphoretic. No erythema.  Psychiatric: He has a normal mood and affect. His behavior is normal. Judgment and thought content normal.    Filed Vitals:   10/21/13 1545  BP: 144/60  Pulse: 60  Temp: 97.8 F (36.6 C)  TempSrc: Tympanic  Resp: 20      Labs reviewed: Basic Metabolic Panel:  Recent Labs  16/01/9606/02/14 11/28/12 10/20/13  NA 137 136* 135*  K 4.7 4.3 4.4  BUN 42* 31* 36*  CREATININE 1.4* 1.4* 1.3  TSH  --  3.09  --    Liver Function Tests: No results found for this basename: AST, ALT, ALKPHOS, BILITOT, PROT, ALBUMIN,  in the last 8760 hours CBC:  Recent Labs  10/23/12 04/10/13  WBC 6.9 8.3  HGB 12.9* 13.2*  HCT 36* 39*  PLT 157 147*   Lipid Panel: No results found for this basename: CHOL, HDL, LDLCALC, TRIG, CHOLHDL, LDLDIRECT,  in the last 8760 hours  Past Procedures:  09/02/13 X-ray Right hip: no acute bony abnormalities right hip can be identified.   Assessment/Plan Lumbago Lower back-takes Tramadol 100mg  qhs-able to sleep at night.   Depression Stable with Sertraline 50mg  qhs. Will observe the patient.   CHF (congestive heart failure) Compensated, takes Furosemide 20mg  daily. 10/20/13 Bun/creat 36/1.26    Unspecified constipation Stable, takes MiraLax qod   GERD (gastroesophageal reflux disease) Stable, takes Omeprazole 20mg  daily.    BPH (benign prostatic hyperplasia) Tamsulosin 0.4mg  and Finasteride 5mg  daily. Better.      Hip pain, right Pain in his right groin area-positional when he gets in car-better now. X-ray 09/02/13 R hip: no acute bony abnormalities right hip. Staff reported the patient was  observed to have restless leg especially RLE which may contribute to his right groin pain. The patient declined medication for restless leg. He desires watchful waiting. Continue Tramadol 100mg  nightly.   Restless leg Observed by staff. It doesn't interfere his sleep at night. The patient desires no pharmaceutical tx.   Insomnia Takes Ambien 2.5mg  nightly and Sertraline 50mg . Sleeps well at night.     Essential hypertension Controlled on Lisinopril 20mg .        Family/ Staff Communication: observe the patient.   Goals of Care: AL  Labs/tests ordered: BMP and Mg done 10/20/13

## 2013-10-21 NOTE — Assessment & Plan Note (Signed)
Controlled on Lisinopril 20mg 

## 2013-10-21 NOTE — Assessment & Plan Note (Signed)
Stable, takes Omeprazole 20mg daily.  

## 2013-10-21 NOTE — Assessment & Plan Note (Signed)
Compensated, takes Furosemide 20mg  daily. 10/20/13 Bun/creat 36/1.26

## 2013-10-21 NOTE — Assessment & Plan Note (Signed)
Stable, takes MiraLax qod.  

## 2013-10-21 NOTE — Assessment & Plan Note (Signed)
Pain in his right groin area-positional when he gets in car-better now. X-ray 09/02/13 R hip: no acute bony abnormalities right hip. Staff reported the patient was observed to have restless leg especially RLE which may contribute to his right groin pain. The patient declined medication for restless leg. He desires watchful waiting. Continue Tramadol 100mg  nightly.

## 2013-10-27 ENCOUNTER — Other Ambulatory Visit: Payer: Self-pay | Admitting: *Deleted

## 2013-10-27 MED ORDER — TRAMADOL HCL 50 MG PO TABS: ORAL_TABLET | ORAL | Status: DC

## 2013-10-29 ENCOUNTER — Other Ambulatory Visit: Payer: Self-pay | Admitting: *Deleted

## 2013-10-29 MED ORDER — ZOLPIDEM TARTRATE 5 MG PO TABS: ORAL_TABLET | ORAL | Status: DC

## 2013-10-29 NOTE — Telephone Encounter (Signed)
[facility] Order [facility]

## 2013-11-10 DIAGNOSIS — H353290 Exudative age-related macular degeneration, unspecified eye, stage unspecified: Secondary | ICD-10-CM | POA: Diagnosis not present

## 2013-11-10 DIAGNOSIS — H35059 Retinal neovascularization, unspecified, unspecified eye: Secondary | ICD-10-CM | POA: Diagnosis not present

## 2013-11-10 DIAGNOSIS — H35359 Cystoid macular degeneration, unspecified eye: Secondary | ICD-10-CM | POA: Diagnosis not present

## 2013-11-19 DIAGNOSIS — H35059 Retinal neovascularization, unspecified, unspecified eye: Secondary | ICD-10-CM | POA: Diagnosis not present

## 2013-11-19 DIAGNOSIS — H353290 Exudative age-related macular degeneration, unspecified eye, stage unspecified: Secondary | ICD-10-CM | POA: Diagnosis not present

## 2013-11-19 DIAGNOSIS — H353190 Nonexudative age-related macular degeneration, unspecified eye, stage unspecified: Secondary | ICD-10-CM | POA: Diagnosis not present

## 2013-11-19 DIAGNOSIS — H35359 Cystoid macular degeneration, unspecified eye: Secondary | ICD-10-CM | POA: Diagnosis not present

## 2013-11-20 DIAGNOSIS — H4011X0 Primary open-angle glaucoma, stage unspecified: Secondary | ICD-10-CM | POA: Diagnosis not present

## 2013-11-21 ENCOUNTER — Encounter: Payer: Self-pay | Admitting: Nurse Practitioner

## 2013-11-21 DIAGNOSIS — H3530 Unspecified macular degeneration: Secondary | ICD-10-CM

## 2013-11-21 NOTE — Progress Notes (Signed)
This encounter was created in error - please disregard.

## 2013-11-26 ENCOUNTER — Other Ambulatory Visit: Payer: Self-pay | Admitting: *Deleted

## 2013-11-26 MED ORDER — TRAMADOL HCL 50 MG PO TABS: ORAL_TABLET | ORAL | Status: DC

## 2013-11-26 NOTE — Telephone Encounter (Signed)
[facility] 

## 2013-11-28 ENCOUNTER — Encounter: Payer: Self-pay | Admitting: Nurse Practitioner

## 2013-11-28 DIAGNOSIS — Z66 Do not resuscitate: Secondary | ICD-10-CM | POA: Insufficient documentation

## 2013-12-03 ENCOUNTER — Ambulatory Visit (INDEPENDENT_AMBULATORY_CARE_PROVIDER_SITE_OTHER): Payer: Medicare Other | Admitting: Internal Medicine

## 2013-12-03 ENCOUNTER — Encounter: Payer: Self-pay | Admitting: Internal Medicine

## 2013-12-03 VITALS — BP 136/64 | HR 70 | Ht 68.0 in | Wt 182.0 lb

## 2013-12-03 DIAGNOSIS — I10 Essential (primary) hypertension: Secondary | ICD-10-CM

## 2013-12-03 DIAGNOSIS — I495 Sick sinus syndrome: Secondary | ICD-10-CM | POA: Diagnosis not present

## 2013-12-03 DIAGNOSIS — I2510 Atherosclerotic heart disease of native coronary artery without angina pectoris: Secondary | ICD-10-CM | POA: Diagnosis not present

## 2013-12-03 LAB — MDC_IDC_ENUM_SESS_TYPE_INCLINIC
Battery Impedance: 130 Ohm
Battery Voltage: 2.79 V
Brady Statistic AP VP Percent: 89 %
Brady Statistic AP VS Percent: 0 %
Lead Channel Pacing Threshold Amplitude: 0.5 V
Lead Channel Pacing Threshold Pulse Width: 0.4 ms
Lead Channel Setting Pacing Amplitude: 2 V
Lead Channel Setting Pacing Amplitude: 2.5 V
Lead Channel Setting Pacing Pulse Width: 0.4 ms
Lead Channel Setting Sensing Sensitivity: 5.6 mV
MDC IDC MSMT BATTERY REMAINING LONGEVITY: 110 mo
MDC IDC MSMT LEADCHNL RA IMPEDANCE VALUE: 467 Ohm
MDC IDC MSMT LEADCHNL RA PACING THRESHOLD AMPLITUDE: 0.5 V
MDC IDC MSMT LEADCHNL RA PACING THRESHOLD PULSEWIDTH: 0.4 ms
MDC IDC MSMT LEADCHNL RV IMPEDANCE VALUE: 682 Ohm
MDC IDC MSMT LEADCHNL RV SENSING INTR AMPL: 22.4 mV
MDC IDC SESS DTM: 20150812140254
MDC IDC STAT BRADY AS VP PERCENT: 11 %
MDC IDC STAT BRADY AS VS PERCENT: 0 %

## 2013-12-03 NOTE — Patient Instructions (Addendum)
Remote monitoring is used to monitor your pacemaker from home. This monitoring reduces the number of office visits required to check your device to one time per year. It allows us to keep an eye on the functioning of your device to ensure it is working properly. You are scheduled for a device check from home on 03-09-2014. You may send your transmission at any time that day. If you have a wireless device, the transmission will be sent automatically. After your physician reviews your transmission, you will receive a postcard with your next transmission date.  Your physician wants you to follow-up in: 1 YEAR with Dr. Johney FrameAllred.   You will receive a reminder letter in the mail two months in advance. If you don't receive a letter, please call our office to schedule the follow-up appointment.

## 2013-12-03 NOTE — Progress Notes (Signed)
PCP:  Dr Chilton SiGreen Primary Cardiologist:  Dr Suella BroadSmith  Caleb Nash is a 80 y.o. male with a h/o symptomatic bradycardia sp PPM (MDT) who presents today for follow-up in the Electrophysiology device clinic.  The patient reports doing very well.  He has an occasional cough when he eats.  I have encouraged him to discuss possibly dysphagia with Dr Chilton SiGreen.    Today, he  denies symptoms of palpitations, chest pain, shortness of breath, orthopnea, PND, lower extremity edema, dizziness, presyncope, syncope, or neurologic sequela.  The patientis tolerating medications without difficulties and is otherwise without complaint today.   Past Medical History  Diagnosis Date  . Osteoarthritis   . HTN (hypertension)   . BPH (benign prostatic hyperplasia)   . Sick sinus syndrome 10/14/2003; 10/29/2012    MDT EnPulse implanted by Dr Amil AmenEdmunds for SSS; generator change 10/29/2012 by Dr Johney FrameAllred MDT Jana HalfSensia pacemaker  . CAD (coronary artery disease)     s/p CABG 2005  . Macular degeneration   . Hyperlipidemia   . Depression     mild per pt  . Sporotrichosis     remote  . History of rhabdomyolysis   . Ingrowing nail 06/11/2012  . Abnormality of gait 06/11/2012  . Major depressive disorder, single episode, unspecified 04/28/2012  . Unspecified glaucoma 04/28/2012  . Coronary atherosclerosis of native coronary artery 04/28/2012  . Atrial fibrillation 04/28/2012  . Congestive heart failure, unspecified 04/28/2012  . Reflux esophagitis 04/28/2012  . Hypertrophy of prostate without urinary obstruction and other lower urinary tract symptoms (LUTS) 04/28/2012  . Insomnia, unspecified 04/28/2012  . Edema 04/28/2012  . Allergy, unspecified not elsewhere classified 04/28/2012  . Personal history of fall 04/28/2012  . Cardiac pacemaker in situ 04/28/2012  . Other specified disease of white blood cells 04/26/2012  . Alcohol abuse 04/26/2012  . Chronic airway obstruction, not elsewhere classified 04/26/2012  . Muscle weakness (generalized) 04/26/2012  .  Rhabdomyolysis   . CHF (congestive heart failure) 04/23/2012  . Degenerative joint disease 04/21/2012  . Essential hypertension 10/17/2012  . Fall 04/21/2012    Prolonged downtime 04/20/2012   . GERD (gastroesophageal reflux disease) 09/13/2012  . Hip pain, right 09/02/2013  . Insomnia 11/15/2012  . NSTEMI (non-ST elevated myocardial infarction) 04/23/2012    Related to the stress of rhabdomyolysis and prolonged downtime prior to admission in a patient with known coronary artery disease   . Pacemaker 04/21/2012    Near EOL.   . Paroxysmal atrial tachycardia 04/21/2012    Brief episodes. Not felt to be an anticoagulation candidate because of age and frailty   . Pericarditis 04/21/2012  . Seborrheic eczema 12/03/2012  . Tachy-brady syndrome 04/21/2012    Pacemaker is near EOL   . Unspecified constipation 09/13/2012  . Lumbago    Past Surgical History  Procedure Laterality Date  . Pacemaker insertion  10/14/2003;10/29/2012    MDT Implanted by Dr Amil AmenEdmunds for SSS; generator change 10/29/2012 by Dr Johney FrameAllred Medtronic [place] [employer]  . Coronary artery bypass graft  2005  . Bilateral knee arthroplasty  09/28/83; 05/17/1992    right then left  . Appendectomy    . Yag laser application Right 12/26/1973    eye  . Hernia repair Right 05/19/1976  . Transurethral resection of prostate  07/27/1983  . Cystoscopy  08/14/85  . Epididymis surgery  09/17/1985  . Angioplasty  9/11/190    Dr. Katrinka BlazingSmith  . Replacement total knee bilateral  1992 and 1996  . Cataract extraction Right 10/30/1996  .  Shoulder open rotator cuff repair Left 02/07/1996  . Back surgery  03/11/2001  . Yag laser application Right 04/30/2001  . Yag laser application Left 05/07/2001  . Toe surgery Right 2005    correction  . Carpal tunnel release Left   . Elbow surgery      repair  . Carpal tunnel release Right 2007  . Rotator cuff repair Right 2008  . Sigmoidoscopy  08/26/2001  . Pacemaker generator change  10/29/12    Dr. Johney Frame    History   Social  History  . Marital Status: Single    Spouse Name: N/A    Number of Children: N/A  . Years of Education: N/A   Occupational History  . retired IT trainer    Social History Main Topics  . Smoking status: Never Smoker   . Smokeless tobacco: Never Used  . Alcohol Use: No  . Drug Use: No  . Sexual Activity: No   Other Topics Concern  . Not on file   Social History Narrative   Pt lives in assisted living at [facility] following a fall 12/13.   Retired IT trainer.   Widowed   No siblings    MOST  Form signed   DNR    Family History  Problem Relation Age of Onset  . Cancer    . CVA Mother   . CVA Father     Allergies  Allergen Reactions  . Celebrex [Celecoxib]     Lips swelled  . Sulfur     Lips swelled  . Vioxx [Rofecoxib] Other (See Comments)    unknown    Current Outpatient Prescriptions  Medication Sig Dispense Refill  . amoxicillin (AMOXIL) 500 MG capsule Take 2,000 mg by mouth daily as needed (prior to surgery).      Marland Kitchen aspirin 81 MG tablet Take 81 mg by mouth daily.      . bimatoprost (LUMIGAN) 0.01 % SOLN Place 1 drop into both eyes at bedtime.       . finasteride (PROSCAR) 5 MG tablet Take one tablet by mouth once daily      . fluticasone (FLONASE) 50 MCG/ACT nasal spray Place 1 spray into both nostrils 2 (two) times daily.      . furosemide (LASIX) 20 MG tablet Take one tablet by mouth once daily. One twice weekly as needed for increased weight 2-3 lbs      . guaiFENesin (MUCINEX) 600 MG 12 hr tablet Take one tablet twice daily      . KLOR-CON M10 10 MEQ tablet Take 1 tablet twice weekly as needed with Lasix      . lisinopril (PRINIVIL,ZESTRIL) 20 MG tablet Take 20 mg by mouth 2 (two) times daily.      Marland Kitchen loratadine (CLARITIN) 10 MG tablet Take 10 mg by mouth daily.      . Multiple Vitamins-Minerals (ICAPS PO) Take by mouth. Take one daily      . nitroGLYCERIN (NITROSTAT) 0.4 MG SL tablet Place 0.4 mg under the tongue every 5 (five) minutes as needed. For chest pain       . omeprazole (PRILOSEC) 20 MG capsule Take 20 mg by mouth daily.      . polyethylene glycol (MIRALAX / GLYCOLAX) packet Take 17 g by mouth daily. Mix with 8 ounces of water or juice and take by mouth every other day for constipation.      . sertraline (ZOLOFT) 50 MG tablet One daily to help depression  30 tablet  5  .  Tamsulosin HCl (FLOMAX) 0.4 MG CAPS Take 0.4 mg by mouth daily.      . traMADol (ULTRAM) 50 MG tablet Take two tablets by mouth at bedtime for pain  60 tablet  5  . zolpidem (AMBIEN) 5 MG tablet Take 1/2 tablet at bedtime as needed for insomnia  15 tablet  5   No current facility-administered medications for this visit.    ROS- all systems are reviewed and negative except as per HPI  Physical Exam: Filed Vitals:   12/03/13 1348  BP: 136/64  Pulse: 70  Height: 5\' 8"  (1.727 m)  Weight: 182 lb (82.555 kg)    GEN- The patient is well appearing, alert and oriented x 3 today.   Head- normocephalic, atraumatic Eyes-  Sclera clear, conjunctiva pink Ears- hearing intact Oropharynx- clear Neck- supple  Lungs- Clear to ausculation bilaterally, normal work of breathing Chest- R sided pacemaker pocket is well healed Heart- Regular rate and rhythm,  GI- soft, NT, ND, + BS Extremities- no clubbing, cyanosis, or edema Psych- euthymic mood, full affect, very pleasant Neuro- strength and sensation are intact  Pacemaker interrogation- reviewed in detail today,  See PACEART report  Assessment and Plan:  1. Sick sinus syndrome Normal pacemaker function See Pace Art report No changes today  2. CAD Stable No change required today  3. HTN Stable No change required today  4. afib He has had only a single episode of afib lasting 4 minutes since I saw him last Given advanced age and low AF burden, will hold off on anticoagulation for now This will need to be considered if his afib burden increases going forward  carelink every 3 months Return to the device clinic in  1 year Follow-up with Dr Katrinka Blazing for routine cardiology care

## 2013-12-08 ENCOUNTER — Encounter: Payer: Self-pay | Admitting: Internal Medicine

## 2013-12-23 IMAGING — CR DG FOOT 2V*R*
2 series · 2 of 2 positions shown · non-contrast
Comparison: None.

CLINICAL DATA: Fall.  Lacerations and bruising of the toes and
distal foot.

RIGHT FOOT - 2 VIEW

[x foot lat right]
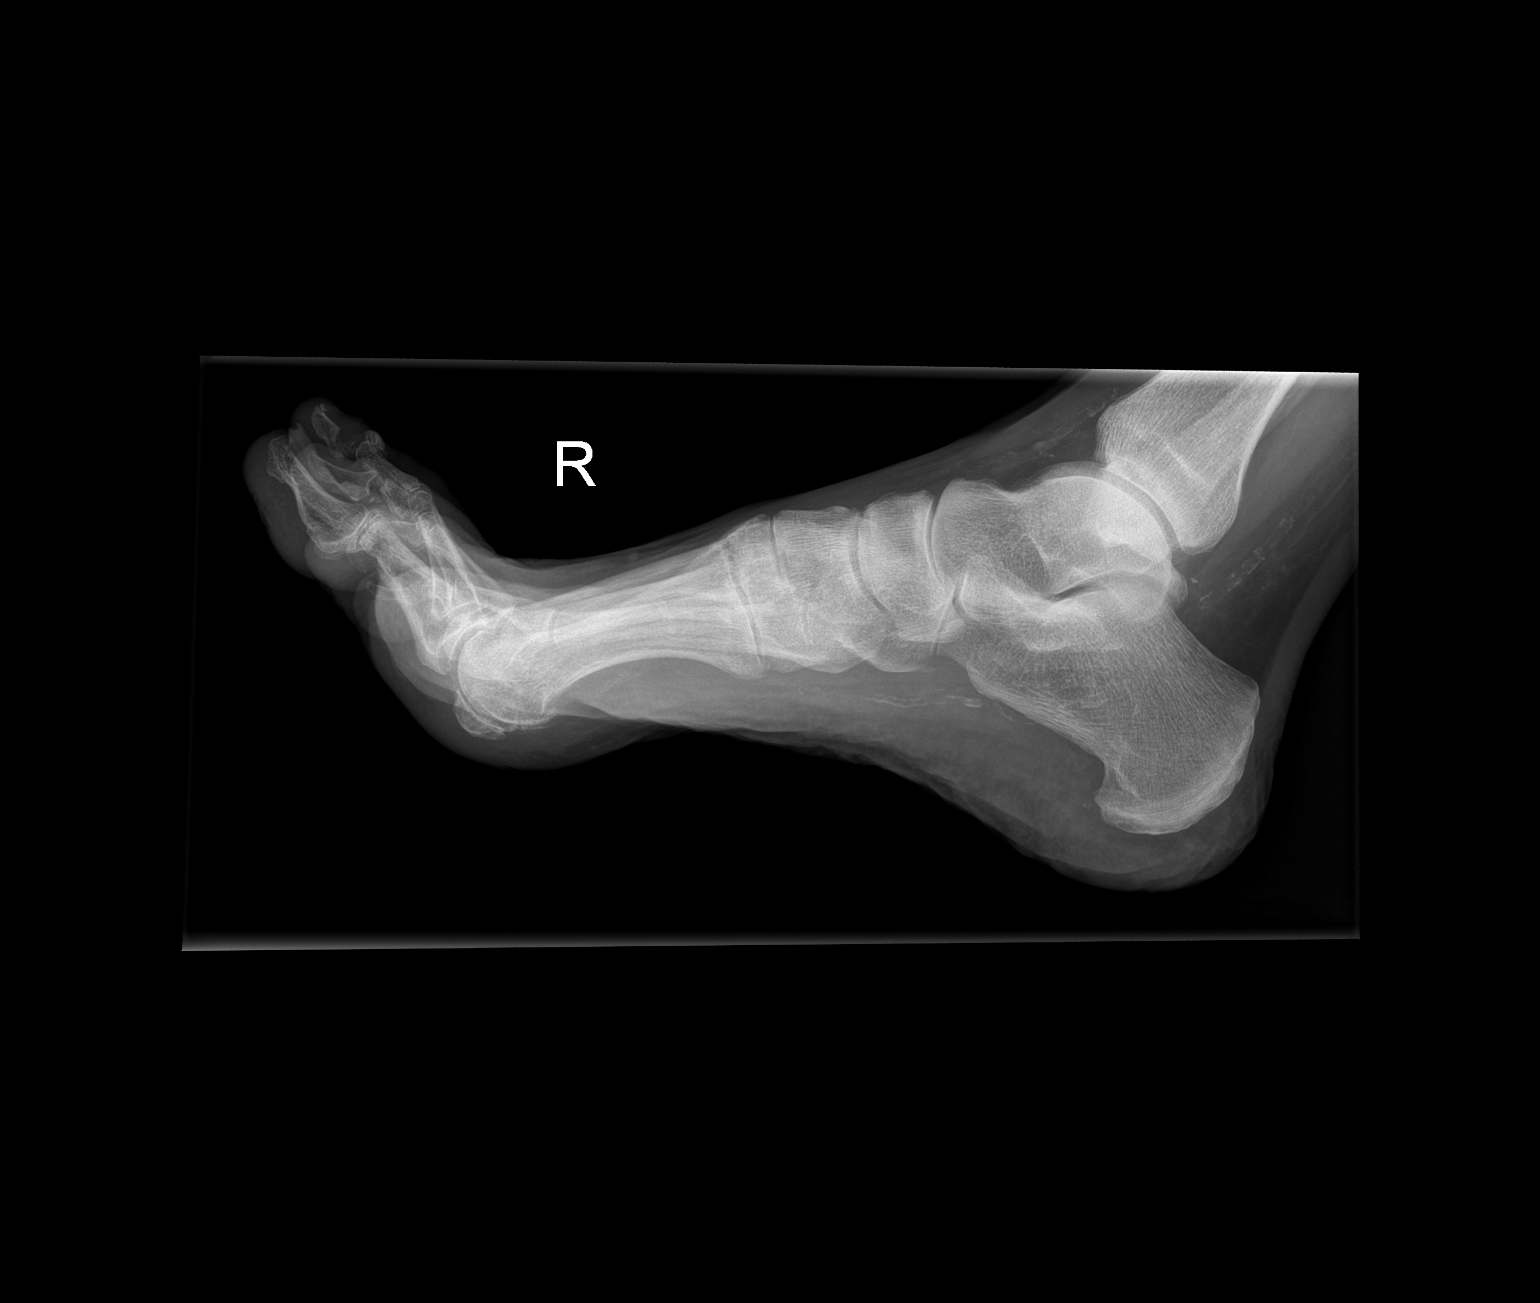

[x foot ap right]
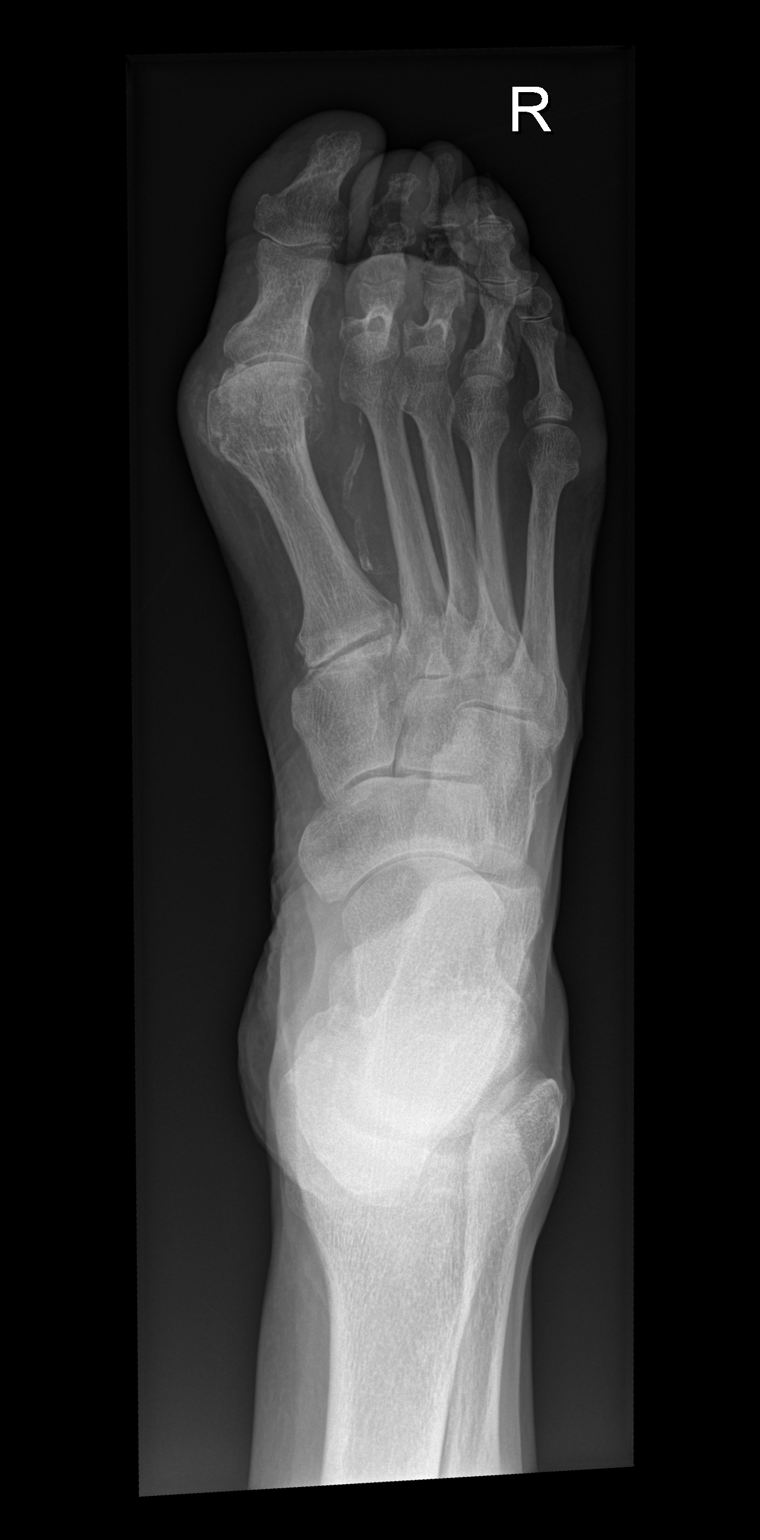

[2 of 2 positions shown; findings below may reference images not displayed]

FINDINGS: Two views are performed, showing overlapping of the toes.
This limits full evaluation of the toes for fracture.  However,
there is a lytic lesion involving the distal phalanx of the second
digit, associated with soft tissue density.  Findings raise a
question of osteomyelitis or other lesions such as metastasis.
There are atherosclerotic calcifications of the arteries of the
feet.
IMPRESSION: 1.  No evidence for acute fracture.
2.  Suspicious lytic lesion involving the distal phalanx of the
second digit.  See above.

## 2013-12-23 IMAGING — CR DG CHEST 2V
3 series · 3 of 3 positions shown · non-contrast
Comparison: 04/21/2012

CLINICAL DATA: Shortness of breath.  Syncope.

CHEST - 2 VIEW

[w chest lat (1 of 2)]
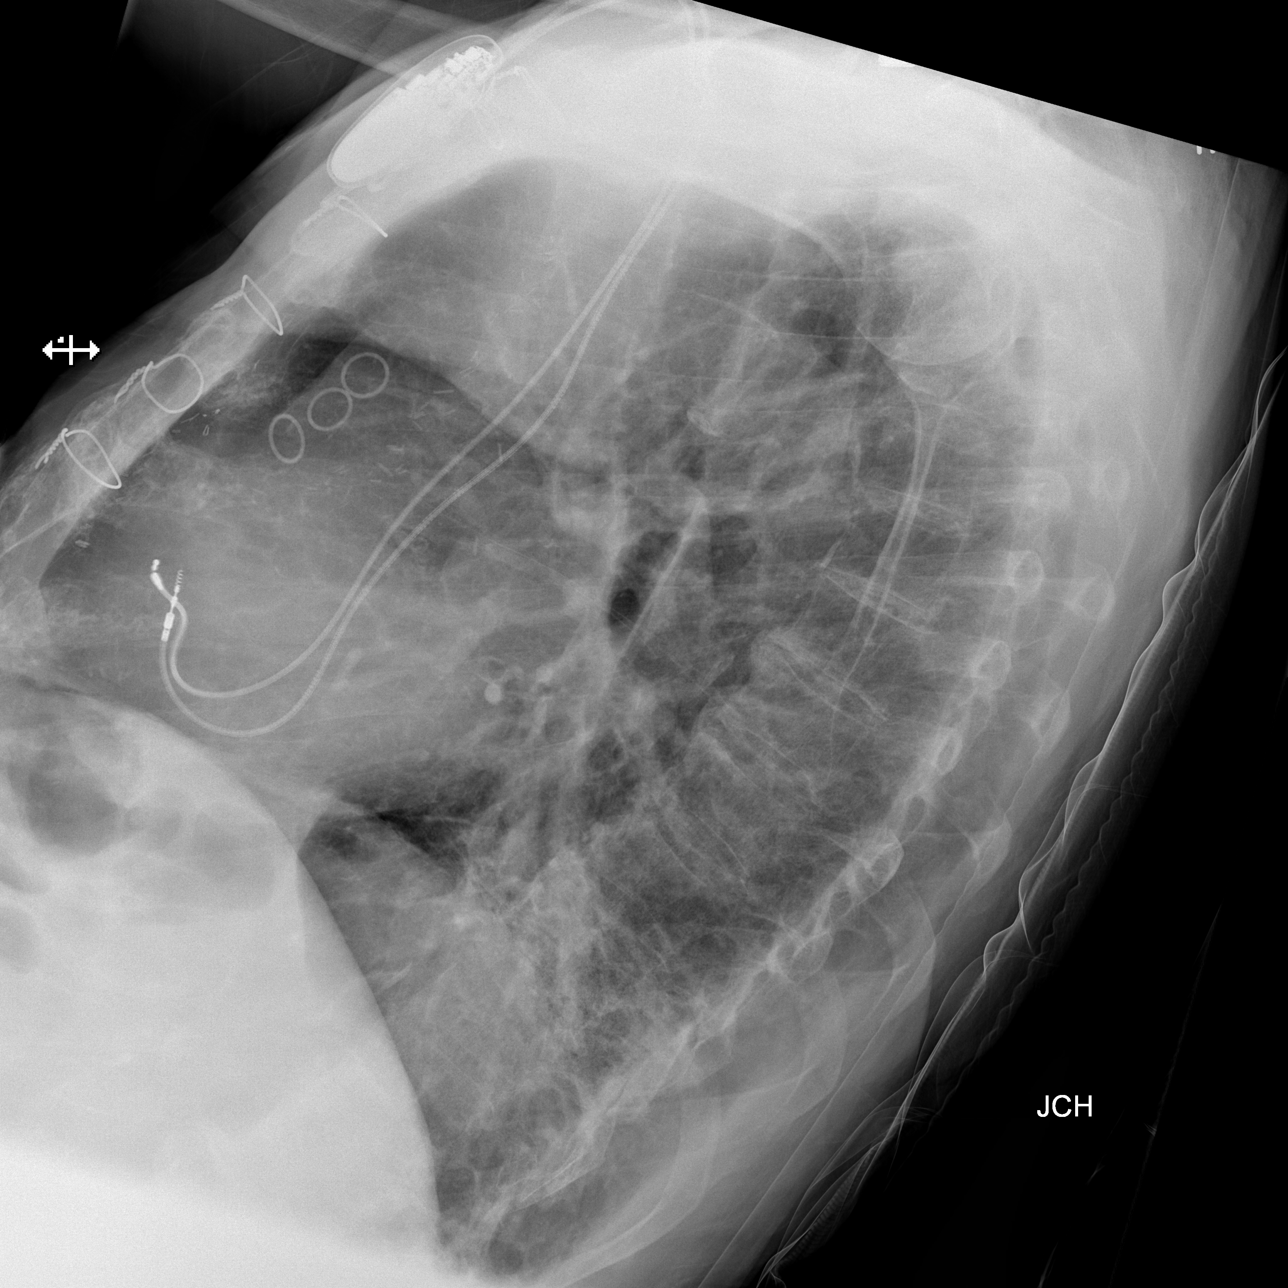

[w chest lat (2 of 2)]
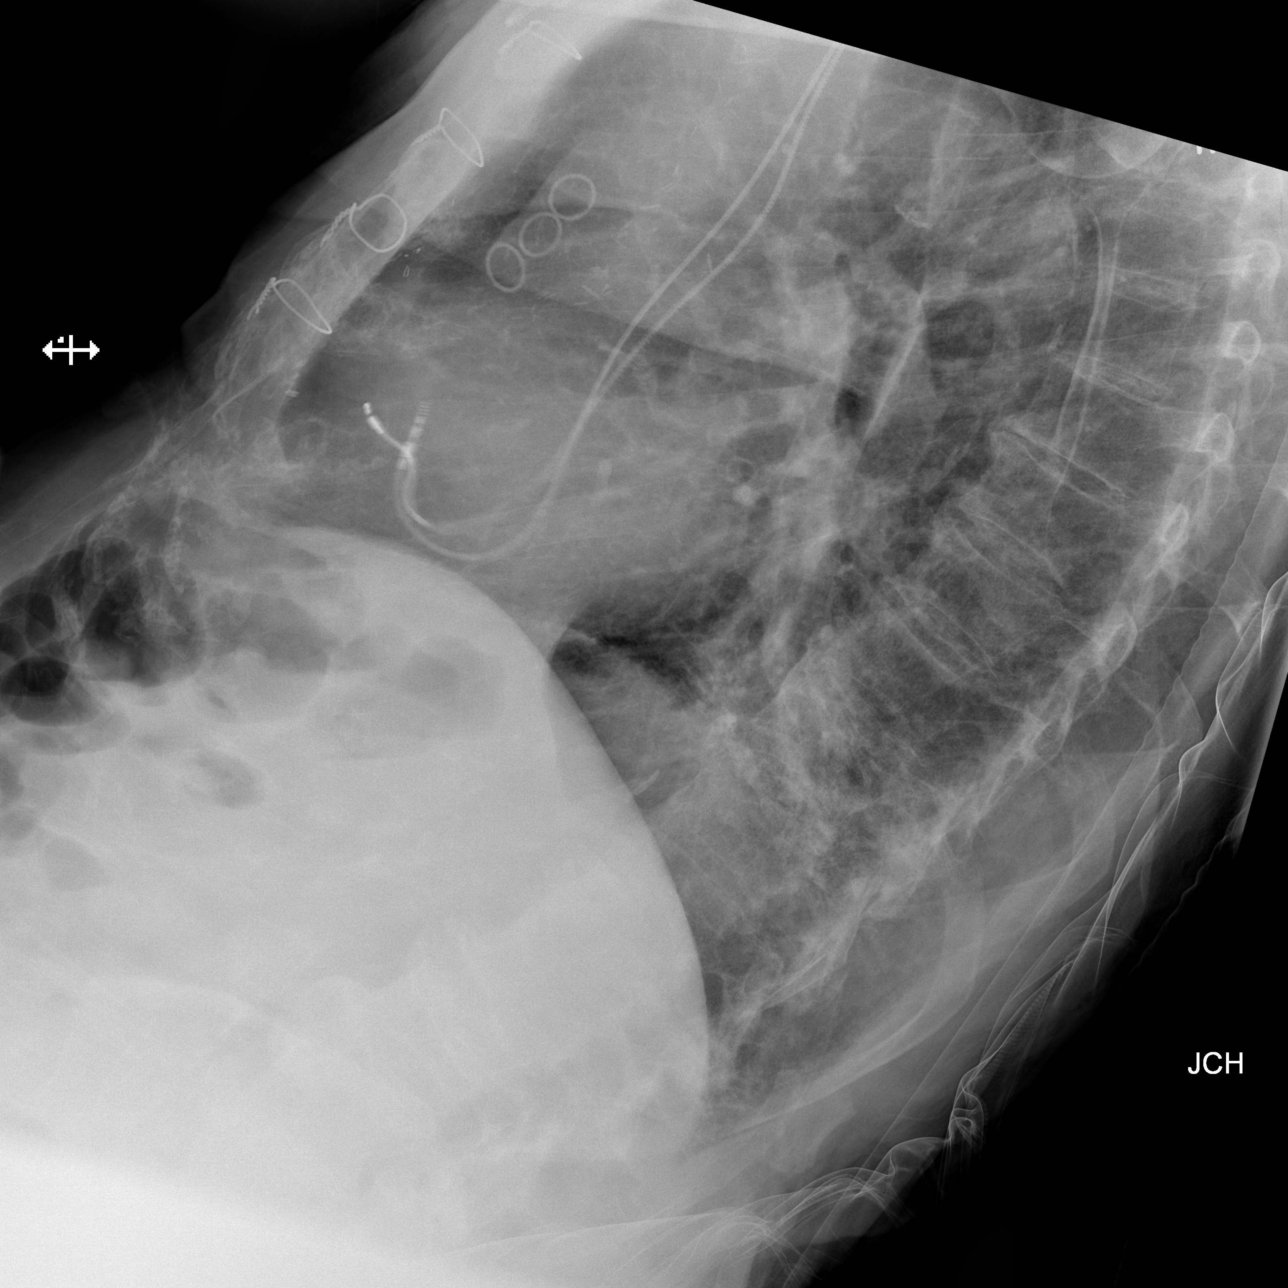

[x chest ap]
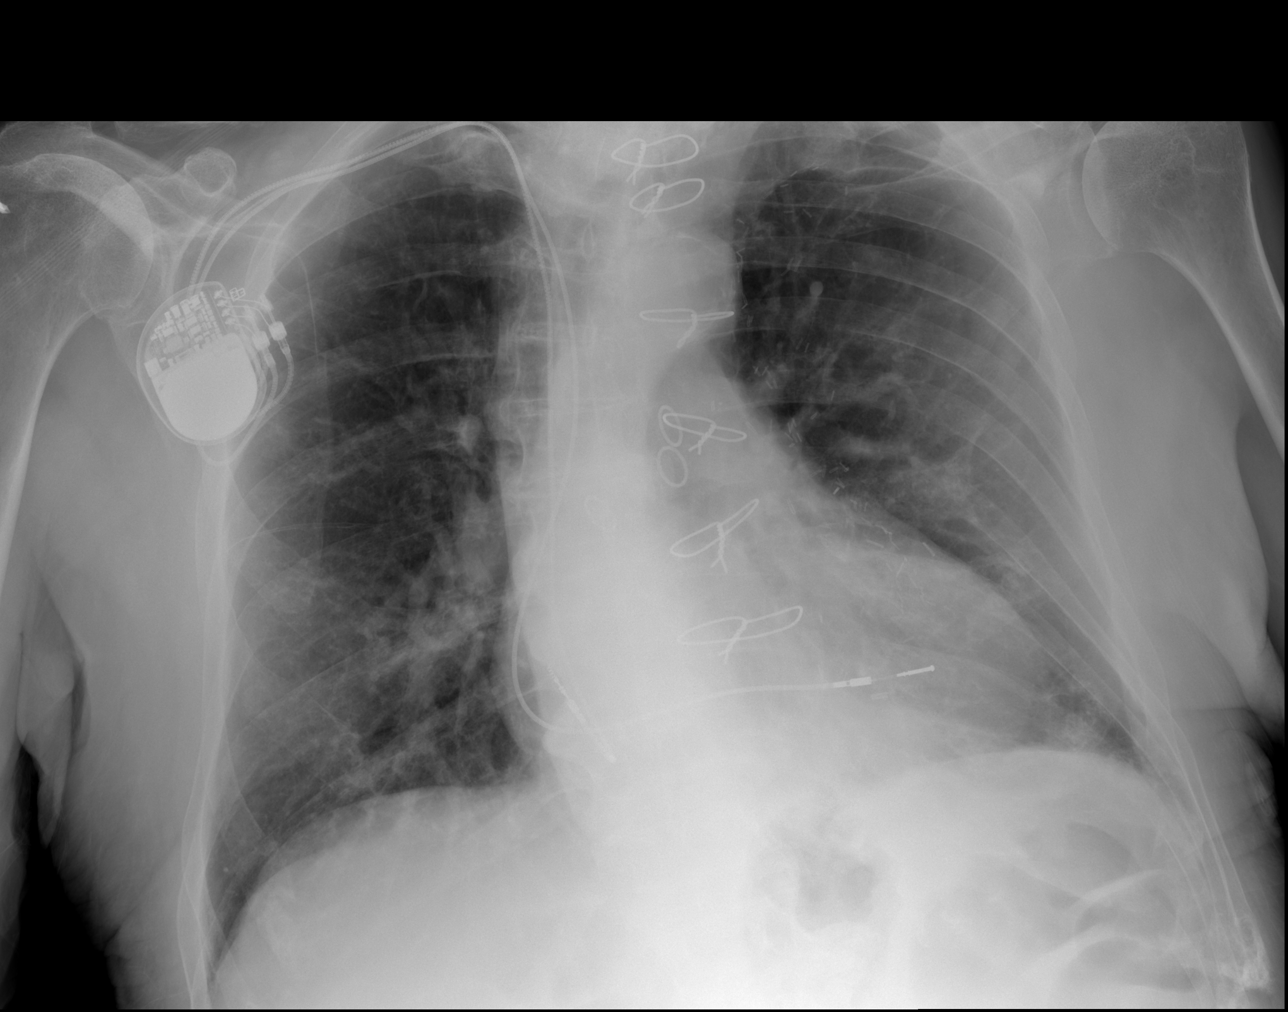

[3 of 3 positions shown; findings below may reference images not displayed]

FINDINGS: Improved aeration of both lungs is seen, with resolution
of left retrocardiac atelectasis.  No evidence of pulmonary
infiltrate or edema.  No evidence of pleural effusion.  Mild
cardiomegaly stable.  Dual lead transvenous pacemaker remains in
appropriate position.  Prior CABG again noted. Pulmonary
hyperinflation is consistent with COPD.
IMPRESSION: 1.  Improved aeration with resolution of left retrocardiac
atelectasis.
2.  COPD.  No acute findings.

## 2013-12-23 IMAGING — CR DG CHEST 1V PORT
1 series · 1 of 1 positions shown · non-contrast
Comparison: 11/06/2006.

CLINICAL DATA: Fall with bruising.  Loss of consciousness.

PORTABLE CHEST - 1 VIEW

[AP]
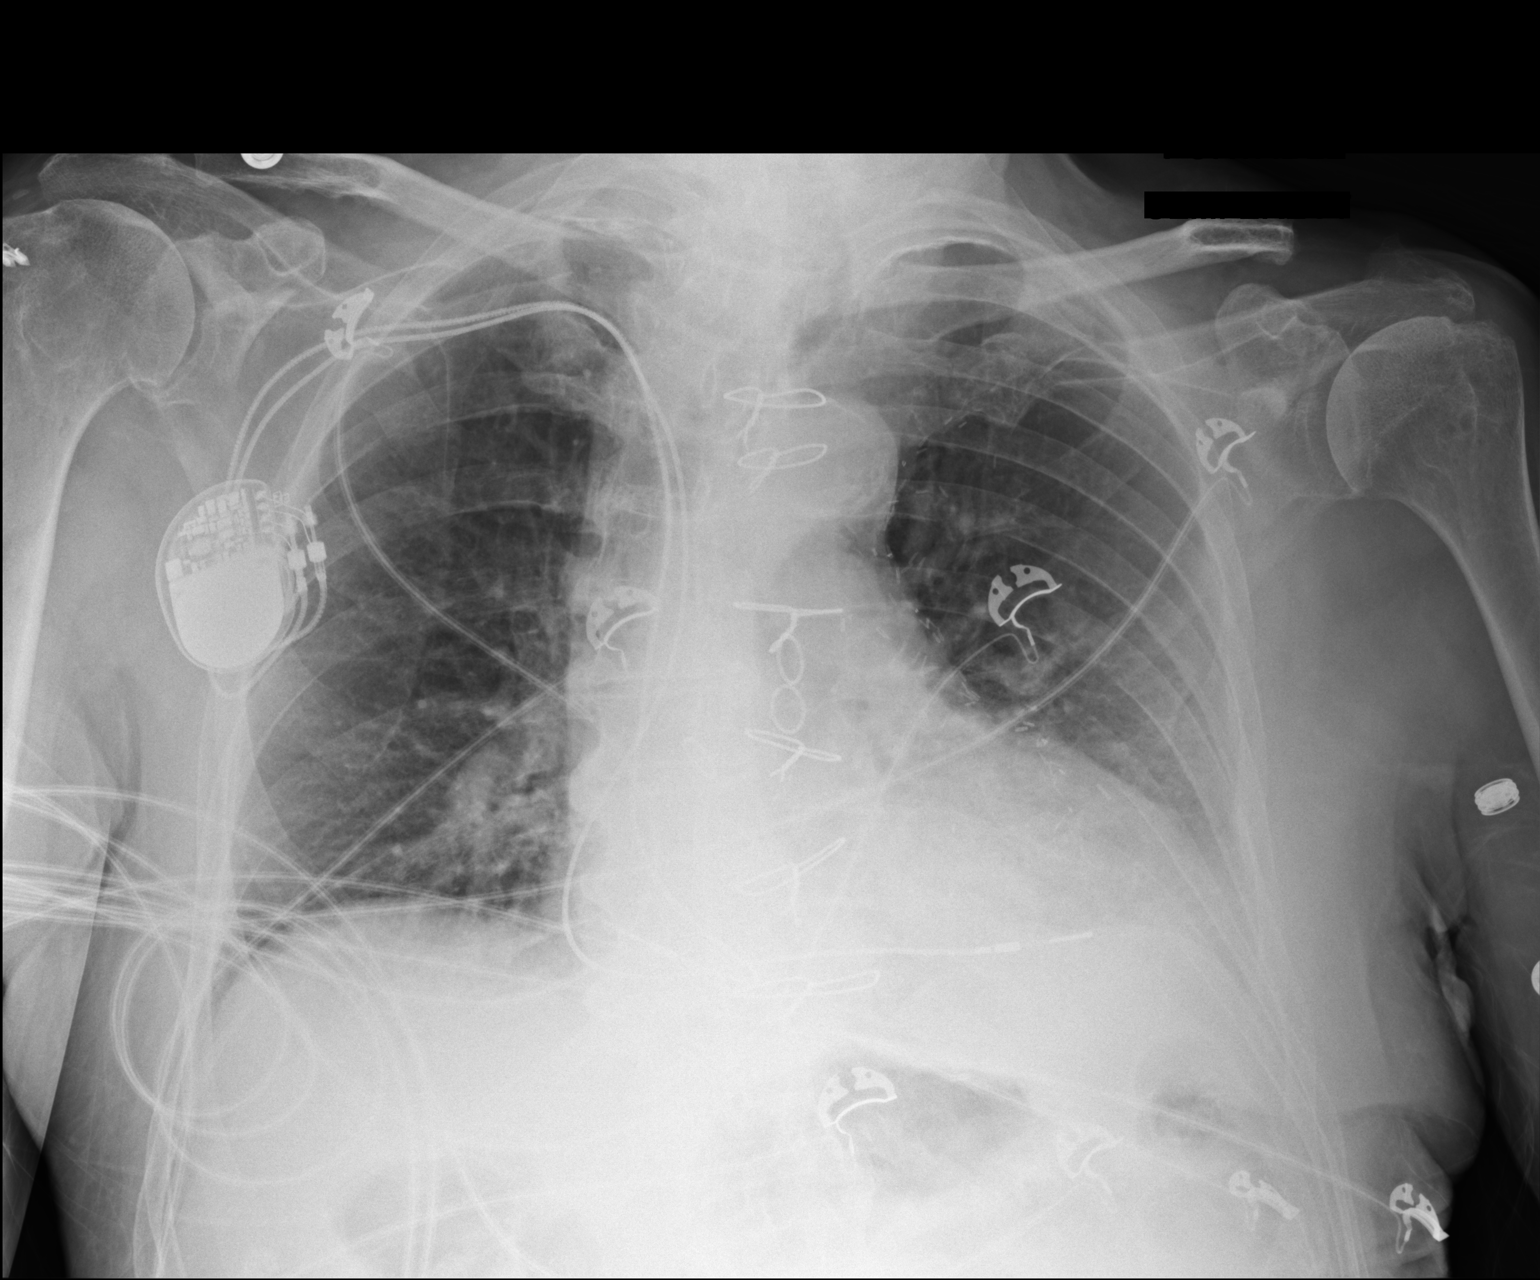

[1 of 1 positions shown; findings below may reference images not displayed]

FINDINGS: Trachea is midline.  Heart size is accentuated by AP semi
upright technique and low lung volumes.  Right subclavian pacemaker
lead tips project over the right atrium and right ventricle.
Biapical pleural parenchymal scarring.  Mild bibasilar air space
disease, left greater than right.  No definite pleural fluid.  Post-
traumatic or postsurgical changes involving the distal left
clavicle, unchanged.
IMPRESSION: Low lung volumes with mild bibasilar air space disease, left
greater than right, possibly due to atelectasis.  Aspiration and
pneumonia are not excluded.

## 2013-12-24 DIAGNOSIS — H353290 Exudative age-related macular degeneration, unspecified eye, stage unspecified: Secondary | ICD-10-CM | POA: Diagnosis not present

## 2013-12-24 DIAGNOSIS — H35059 Retinal neovascularization, unspecified, unspecified eye: Secondary | ICD-10-CM | POA: Diagnosis not present

## 2013-12-24 DIAGNOSIS — H35359 Cystoid macular degeneration, unspecified eye: Secondary | ICD-10-CM | POA: Diagnosis not present

## 2014-01-06 ENCOUNTER — Encounter: Payer: Self-pay | Admitting: Internal Medicine

## 2014-01-06 DIAGNOSIS — M545 Low back pain, unspecified: Secondary | ICD-10-CM | POA: Diagnosis not present

## 2014-01-06 DIAGNOSIS — R109 Unspecified abdominal pain: Secondary | ICD-10-CM | POA: Insufficient documentation

## 2014-01-07 DIAGNOSIS — N390 Urinary tract infection, site not specified: Secondary | ICD-10-CM | POA: Diagnosis not present

## 2014-01-09 ENCOUNTER — Other Ambulatory Visit: Payer: Self-pay | Admitting: Nurse Practitioner

## 2014-01-09 DIAGNOSIS — M545 Low back pain, unspecified: Secondary | ICD-10-CM

## 2014-01-13 ENCOUNTER — Non-Acute Institutional Stay: Payer: Medicare Other | Admitting: Internal Medicine

## 2014-01-13 ENCOUNTER — Encounter: Payer: Self-pay | Admitting: Internal Medicine

## 2014-01-13 VITALS — BP 126/62 | HR 60 | Wt 185.0 lb

## 2014-01-13 DIAGNOSIS — I10 Essential (primary) hypertension: Secondary | ICD-10-CM | POA: Diagnosis not present

## 2014-01-13 DIAGNOSIS — L821 Other seborrheic keratosis: Secondary | ICD-10-CM | POA: Diagnosis not present

## 2014-01-13 DIAGNOSIS — M545 Low back pain, unspecified: Secondary | ICD-10-CM | POA: Diagnosis not present

## 2014-01-13 DIAGNOSIS — M25559 Pain in unspecified hip: Secondary | ICD-10-CM | POA: Diagnosis not present

## 2014-01-13 DIAGNOSIS — I2510 Atherosclerotic heart disease of native coronary artery without angina pectoris: Secondary | ICD-10-CM

## 2014-01-13 DIAGNOSIS — R141 Gas pain: Secondary | ICD-10-CM

## 2014-01-13 DIAGNOSIS — R143 Flatulence: Secondary | ICD-10-CM | POA: Insufficient documentation

## 2014-01-13 DIAGNOSIS — M25551 Pain in right hip: Secondary | ICD-10-CM

## 2014-01-13 DIAGNOSIS — R142 Eructation: Secondary | ICD-10-CM

## 2014-01-13 NOTE — Progress Notes (Signed)
Patient ID: Caleb Nash, male   DOB: February 22, 1914, 80 y.o.   MRN: [id number]    Location:  [facility]   Place of Service: Clinic (12)    Allergies  Allergen Reactions  . Celebrex [Celecoxib]     Lips swelled  . Sulfur     Lips swelled  . Vioxx [Rofecoxib] Other (See Comments)    unknown    Chief Complaint  Patient presents with  . Medical Management of Chronic Issues    CHF, blood pressure, depression, insomnia  . Gas    daily after meals  . spot    on nose wants checked    HPI:  Essential hypertension: controlled  Hip pain, right: improved  Midline low back pain without sciatica: improved  Seborrheic keratosis: left side of the nose. A nuisance per patient. No bleeding or pain.    Medications: Patient's Medications  New Prescriptions   No medications on file  Previous Medications   AMOXICILLIN (AMOXIL) 500 MG CAPSULE    Take 2,000 mg by mouth daily as needed (prior to surgery).   ASPIRIN 81 MG TABLET    Take 81 mg by mouth daily.   BIMATOPROST (LUMIGAN) 0.01 % SOLN    Place 1 drop into both eyes at bedtime.    FINASTERIDE (PROSCAR) 5 MG TABLET    Take one tablet by mouth once daily   FLUTICASONE (FLONASE) 50 MCG/ACT NASAL SPRAY    Place 1 spray into both nostrils 2 (two) times daily.   FUROSEMIDE (LASIX) 20 MG TABLET    Take one tablet by mouth once daily. One twice weekly as needed for increased weight 2-3 lbs   GUAIFENESIN (MUCINEX) 600 MG 12 HR TABLET    Take one tablet twice daily   KLOR-CON M10 10 MEQ TABLET    Take 1 tablet twice weekly as needed with Lasix   LISINOPRIL (PRINIVIL,ZESTRIL) 20 MG TABLET    Take 20 mg by mouth 2 (two) times daily.   LORATADINE (CLARITIN) 10 MG TABLET    Take 10 mg by mouth daily.   MULTIPLE VITAMINS-MINERALS (ICAPS PO)    Take by mouth. Take one daily   NITROGLYCERIN (NITROSTAT) 0.4 MG SL TABLET    Place 0.4 mg under the tongue every 5 (five) minutes as needed. For chest pain   OMEPRAZOLE (PRILOSEC) 20 MG CAPSULE     Take 20 mg by mouth daily.   POLYETHYLENE GLYCOL (MIRALAX / GLYCOLAX) PACKET    Take 17 g by mouth daily. Mix with 8 ounces of water or juice and take by mouth every other day for constipation.   SERTRALINE (ZOLOFT) 50 MG TABLET    One daily to help depression   TAMSULOSIN HCL (FLOMAX) 0.4 MG CAPS    Take 0.4 mg by mouth daily.   VOLTAREN 1 % GEL    Apply to back twice daily   ZOLPIDEM (AMBIEN) 5 MG TABLET    Take 1/2 tablet at bedtime as needed for insomnia  Modified Medications   Modified Medication Previous Medication   TRAMADOL (ULTRAM) 50 MG TABLET traMADol (ULTRAM) 50 MG tablet      50 mg. Take one tablet in morning,  two tablets by mouth at bedtime for pain    Take two tablets by mouth at bedtime for pain  Discontinued Medications   No medications on file     Review of Systems  Constitutional: Negative.  Negative for activity change, appetite change, fatigue and unexpected weight change.  HENT: Negative.  Negative for hearing loss, sinus pressure and sore throat.   Eyes:       Hx macular degeneration. Receiving injections every 6-8 weeks.  Respiratory: Positive for shortness of breath. Negative for apnea, cough, chest tightness, wheezing and stridor.   Cardiovascular: Positive for leg swelling. Negative for chest pain and palpitations.  Gastrointestinal: Negative for nausea, vomiting, abdominal pain and abdominal distention.       Flatulence  Endocrine: Negative.   Genitourinary: Negative for urgency, frequency and decreased urine volume.       BPH.  Musculoskeletal: Positive for gait problem (uunstable. uses walker). Negative for back pain, neck pain and neck stiffness.  Skin: Negative.        Dry. Itching on dorsum of hands.  Allergic/Immunologic: Negative.   Neurological: Negative.   Hematological: Negative.   Psychiatric/Behavioral: Positive for sleep disturbance (iinsomnia. Ambien helps.).    Filed Vitals:   01/13/14 1055  BP: 126/62  Pulse: 60  Weight: 185 lb  (83.915 kg)   Body mass index is 28.14 kg/(m^2).  Physical Exam  Constitutional: He is oriented to person, place, and time. He appears well-nourished. No distress.  Frail  HENT:  Head: Normocephalic and atraumatic.  Right Ear: External ear normal.  Left Ear: External ear normal.  Nose: Nose normal.  Mouth/Throat: Oropharynx is clear and moist.  Mild decrease in hearing  Eyes: Conjunctivae and EOM are normal. Pupils are equal, round, and reactive to light.  Neck: No JVD present. No tracheal deviation present. No thyromegaly present.  Cardiovascular: Normal rate, regular rhythm and intact distal pulses.  Exam reveals friction rub. Exam reveals no gallop.   No murmur heard. Pulmonary/Chest: Effort normal and breath sounds normal. No respiratory distress. He has no wheezes. He has no rales.  Abdominal: He exhibits no distension and no mass. There is no tenderness.  Large upper diastasis recti  Musculoskeletal: Normal range of motion. He exhibits edema (1+ bipedal). He exhibits no tenderness.  Using walker. Bilateral knee prosthesis.  Lymphadenopathy:    He has no cervical adenopathy.  Neurological: He is alert and oriented to person, place, and time. He has normal reflexes. Abnormal reflex: Xray was normal. No cranial nerve deficit. Coordination normal.  Diminished sensation to vibration in the right foot.  Skin: Skin is dry. No rash noted. No erythema. No pallor.  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 Yellow and Valita Righter bruise at right lowwer lateral rib cage.  Multiple SK.  Psychiatric: He has a normal mood and affect. His behavior is normal. Judgment and thought content normal.     Labs reviewed: Office Visit on 12/03/2013  Component Date Value Ref Range Status  . Date Time Interrogation Session 12/03/2013 [id number]   Final  . Pulse Generator Manufacturer 12/03/2013 Medtronic   Final  . Pulse Gen Model 12/03/2013 [id number] Jana Half    Final  . Pulse Gen Serial Number 12/03/2013 [id number]   Final  . RV Sense Sensitivity 12/03/2013 5.60   Final  . RA Pace Amplitude 12/03/2013 2.000   Final  . RV Pace PulseWidth 12/03/2013 0.40   Final  . RV Pace Amplitude 12/03/2013 2.500   Final  . RA Impedance 12/03/2013 467   Final  . RA Pacing Amplitude 12/03/2013 0.500   Final  . RA Pacing PulseWidth 12/03/2013 0.40   Final  . RV IMPEDANCE 12/03/2013 682   Final  . RV Amplitude 12/03/2013 22.40   Final  .  RV Pacing Amplitude 12/03/2013 0.500   Final  . RV Pacing PulseWidth 12/03/2013 0.40   Final  . Battery Status 12/03/2013 Unknown   Final  . Battery Longevity 12/03/2013 110   Final  . Battery Voltage 12/03/2013 2.79   Final  . Battery Impedance 12/03/2013 130   Final  . Huston Foley AP VP Percent 12/03/2013 89   Final  . Huston Foley AS VP Percent 12/03/2013 11   Final  . Huston Foley AP VS Percent 12/03/2013 0   Final  . Huston Foley AS VS Percent 12/03/2013 0   Final  . Eval Rhythm 12/03/2013 Jx w/PVCs   Final  . Miscellaneous Comment 12/03/2013    Final                   Value:Pacemaker check in clinic. Normal device function. Thresholds, sensing, impedances consistent with previous measurements. Device programmed to maximize longevity. 9 mode switches (<0.1%)---1 AHR x 4 mins 31 sec at 174/87.  No high ventricular rates                          noted. Device programmed at appropriate safety margins. Histogram distribution appropriate for patient activity level. Device programmed to optimize intrinsic conduction. Estimated longevity 9 years. Patient will follow up via [facility] on 11-16 and with                          JA in 12 months.  [facility]urs[facility] Home on 10/21/2013  Component Date Value Ref Range Status  . Hemoglobin 04/10/2013 13.2* 13.5 - 17.5 g/dL Final  . HCT 16/01/9603 39* 41 - 53 % Final  . Platelets 04/10/2013 147* 150 - 399 K/L Final  . WBC 04/10/2013 8.3   Final  . Glucose 10/20/2013 93   Final  . BUN 10/20/2013 36* 4 - 21 mg/dL Final  .  Creatinine 54/12/8117 1.3  0.6 - 1.3 mg/dL Final  . Potassium 14/78/2956 4.4  3.4 - 5.3 mmol/L Final  . Sodium 10/20/2013 135* 137 - 147 mmol/L Final   01/06/14 LS portable xray: multilevel degenerative changes. Likely neural foraminal narrowing L5-S1. 01/07/14 UA entirely normal  Assessment/Plan  1. Essential hypertension controlled  2. Hip pain, right Improved.   3. Midline low back pain without sciatica Degenerative spine changes. Pain is better at this time.  4. Seborrheic keratosis -1% hydrocortisone cream daily to scaly area of the left side of the nose

## 2014-01-21 DIAGNOSIS — H35359 Cystoid macular degeneration, unspecified eye: Secondary | ICD-10-CM | POA: Diagnosis not present

## 2014-01-21 DIAGNOSIS — H353290 Exudative age-related macular degeneration, unspecified eye, stage unspecified: Secondary | ICD-10-CM | POA: Diagnosis not present

## 2014-01-21 DIAGNOSIS — H35059 Retinal neovascularization, unspecified, unspecified eye: Secondary | ICD-10-CM | POA: Diagnosis not present

## 2014-02-18 DIAGNOSIS — H35051 Retinal neovascularization, unspecified, right eye: Secondary | ICD-10-CM | POA: Diagnosis not present

## 2014-02-18 DIAGNOSIS — H3532 Exudative age-related macular degeneration: Secondary | ICD-10-CM | POA: Diagnosis not present

## 2014-03-05 ENCOUNTER — Telehealth: Payer: Self-pay | Admitting: Internal Medicine

## 2014-03-05 NOTE — Telephone Encounter (Signed)
Spoke w/pt and aware next transmission is 03-09-14/kwm

## 2014-03-05 NOTE — Telephone Encounter (Signed)
New message     Pt calling to verify next remote pacemaker ck.  He thinks it is another day than 11-16.  Please call

## 2014-03-09 ENCOUNTER — Encounter: Payer: Medicare Other | Admitting: *Deleted

## 2014-03-09 ENCOUNTER — Telehealth: Payer: Self-pay | Admitting: Cardiology

## 2014-03-09 NOTE — Telephone Encounter (Signed)
Confirmed remote transmission with pt nurse.  

## 2014-03-13 ENCOUNTER — Encounter: Payer: Self-pay | Admitting: Cardiology

## 2014-03-18 DIAGNOSIS — H4011X1 Primary open-angle glaucoma, mild stage: Secondary | ICD-10-CM | POA: Diagnosis not present

## 2014-03-23 ENCOUNTER — Other Ambulatory Visit: Payer: Self-pay | Admitting: *Deleted

## 2014-03-23 MED ORDER — TRAMADOL HCL 50 MG PO TABS: ORAL_TABLET | ORAL | Status: DC

## 2014-03-23 NOTE — Telephone Encounter (Signed)
[facility] 

## 2014-04-02 ENCOUNTER — Encounter (HOSPITAL_COMMUNITY): Payer: Self-pay | Admitting: Internal Medicine

## 2014-04-02 DIAGNOSIS — H3532 Exudative age-related macular degeneration: Secondary | ICD-10-CM | POA: Diagnosis not present

## 2014-04-02 DIAGNOSIS — H35052 Retinal neovascularization, unspecified, left eye: Secondary | ICD-10-CM | POA: Diagnosis not present

## 2014-04-30 DIAGNOSIS — H3532 Exudative age-related macular degeneration: Secondary | ICD-10-CM | POA: Diagnosis not present

## 2014-04-30 DIAGNOSIS — H35051 Retinal neovascularization, unspecified, right eye: Secondary | ICD-10-CM | POA: Diagnosis not present

## 2014-05-28 ENCOUNTER — Other Ambulatory Visit: Payer: Self-pay | Admitting: *Deleted

## 2014-05-28 MED ORDER — ZOLPIDEM TARTRATE 5 MG PO TABS: ORAL_TABLET | ORAL | Status: DC

## 2014-05-28 NOTE — Telephone Encounter (Signed)
[facility] 

## 2014-06-04 ENCOUNTER — Other Ambulatory Visit: Payer: Self-pay | Admitting: *Deleted

## 2014-06-04 MED ORDER — TRAMADOL HCL 50 MG PO TABS: ORAL_TABLET | ORAL | Status: DC

## 2014-06-04 NOTE — Telephone Encounter (Signed)
[facility] 

## 2014-06-19 ENCOUNTER — Encounter: Payer: Self-pay | Admitting: *Deleted

## 2014-06-26 ENCOUNTER — Telehealth: Payer: Self-pay | Admitting: Internal Medicine

## 2014-06-26 NOTE — Telephone Encounter (Signed)
Informed pt that transmission was received. And his next check is scheduled for 07-02-14. Pt verbalized understanding.

## 2014-06-26 NOTE — Telephone Encounter (Signed)
New message      Pt got a letter stating he has not had his deviced checked in a while.  He said he did not know when it was to be checked.  He want to talk to someone

## 2014-06-30 ENCOUNTER — Other Ambulatory Visit: Payer: Self-pay | Admitting: *Deleted

## 2014-06-30 MED ORDER — ZOLPIDEM TARTRATE 5 MG PO TABS: ORAL_TABLET | ORAL | Status: DC

## 2014-06-30 NOTE — Telephone Encounter (Signed)
[facility]  

## 2014-07-02 ENCOUNTER — Ambulatory Visit (INDEPENDENT_AMBULATORY_CARE_PROVIDER_SITE_OTHER): Payer: Medicare Other | Admitting: *Deleted

## 2014-07-02 DIAGNOSIS — I495 Sick sinus syndrome: Secondary | ICD-10-CM | POA: Diagnosis not present

## 2014-07-02 LAB — MDC_IDC_ENUM_SESS_TYPE_REMOTE
Battery Impedance: 180 Ohm
Battery Remaining Longevity: 101 mo
Brady Statistic AS VS Percent: 0 %
Date Time Interrogation Session: 20160310140352
Lead Channel Impedance Value: 465 Ohm
Lead Channel Impedance Value: 676 Ohm
Lead Channel Pacing Threshold Amplitude: 0.5 V
Lead Channel Pacing Threshold Amplitude: 0.625 V
Lead Channel Pacing Threshold Pulse Width: 0.4 ms
Lead Channel Setting Pacing Amplitude: 2.5 V
MDC IDC MSMT BATTERY VOLTAGE: 2.79 V
MDC IDC MSMT LEADCHNL RV PACING THRESHOLD PULSEWIDTH: 0.4 ms
MDC IDC SET LEADCHNL RA PACING AMPLITUDE: 2 V
MDC IDC SET LEADCHNL RV PACING PULSEWIDTH: 0.4 ms
MDC IDC SET LEADCHNL RV SENSING SENSITIVITY: 5.6 mV
MDC IDC STAT BRADY AP VP PERCENT: 97 %
MDC IDC STAT BRADY AP VS PERCENT: 0 %
MDC IDC STAT BRADY AS VP PERCENT: 3 %

## 2014-07-02 NOTE — Progress Notes (Signed)
Remote pacemaker transmission.   

## 2014-07-06 ENCOUNTER — Encounter: Payer: Self-pay | Admitting: Cardiology

## 2014-07-07 ENCOUNTER — Non-Acute Institutional Stay: Payer: Medicare Other | Admitting: Internal Medicine

## 2014-07-07 ENCOUNTER — Encounter: Payer: Self-pay | Admitting: Internal Medicine

## 2014-07-07 VITALS — BP 122/60 | HR 63 | Temp 97.6°F | Wt 187.0 lb

## 2014-07-07 DIAGNOSIS — R059 Cough, unspecified: Secondary | ICD-10-CM

## 2014-07-07 DIAGNOSIS — I5022 Chronic systolic (congestive) heart failure: Secondary | ICD-10-CM | POA: Diagnosis not present

## 2014-07-07 DIAGNOSIS — R609 Edema, unspecified: Secondary | ICD-10-CM

## 2014-07-07 DIAGNOSIS — F329 Major depressive disorder, single episode, unspecified: Secondary | ICD-10-CM

## 2014-07-07 DIAGNOSIS — M545 Low back pain, unspecified: Secondary | ICD-10-CM

## 2014-07-07 DIAGNOSIS — I10 Essential (primary) hypertension: Secondary | ICD-10-CM

## 2014-07-07 DIAGNOSIS — R05 Cough: Secondary | ICD-10-CM | POA: Diagnosis not present

## 2014-07-07 DIAGNOSIS — F32A Depression, unspecified: Secondary | ICD-10-CM

## 2014-07-07 NOTE — Progress Notes (Signed)
Patient ID: Caleb Nash, male   DOB: [birth year], 81 y.o.   MRN: [id number]    [facility]  [facility] Home Room Number: AL 4  Place of Service: Clinic (12)    Allergies  Allergen Reactions  . Celebrex [Celecoxib]     Lips swelled  . Sulfur     Lips swelled  . Vioxx [Rofecoxib] Other (See Comments)    unknown    Chief Complaint  Patient presents with  . Medical Management of Chronic Issues    blood pressure, CHF, cough (coughs up clear mucus at meal times )    HPI:  Midline low back pain without sciatica  Essential hypertension  Depression  Chronic systolic congestive heart failure  Edema  Cough   Medications: Patient's Medications  New Prescriptions   No medications on file  Previous Medications   ACETAMINOPHEN (TYLENOL) 650 MG CR TABLET    Take 650 mg by mouth. Take one tablet twice daily for pain   AMOXICILLIN (AMOXIL) 500 MG CAPSULE    Take 2,000 mg by mouth daily as needed (prior to surgery).   ASPIRIN 81 MG TABLET    Take 81 mg by mouth daily.   BIMATOPROST (LUMIGAN) 0.01 % SOLN    Place 1 drop into both eyes at bedtime.    CARBOXYMETHYLCELLULOSE (REFRESH PLUS) 0.5 % SOLN    1 drop. Both eyes four times daily as needed for dry eyes   FINASTERIDE (PROSCAR) 5 MG TABLET    Take one tablet by mouth once daily   FLUTICASONE (FLONASE) 50 MCG/ACT NASAL SPRAY    Place 1 spray into both nostrils 2 (two) times daily.   FUROSEMIDE (LASIX) 20 MG TABLET    Take one tablet by mouth once daily. One twice weekly as needed for increased weight 2-3 lbs   GUAIFENESIN (MUCINEX) 600 MG 12 HR TABLET    Take by mouth 2 (two) times daily.   KLOR-CON M10 10 MEQ TABLET    Take 1 tablet twice weekly as needed with Lasix   LISINOPRIL (PRINIVIL,ZESTRIL) 20 MG TABLET    Take one tablet twice daily for blood pressure   LORATADINE (CLARITIN) 10 MG TABLET    Take 10 mg by mouth daily.   MULTIPLE VITAMINS-MINERALS (ICAPS PO)    Take by mouth. Take one daily   NITROGLYCERIN  (NITROSTAT) 0.4 MG SL TABLET    Place 0.4 mg under the tongue every 5 (five) minutes as needed. For chest pain   OMEPRAZOLE (PRILOSEC) 20 MG CAPSULE    Take 20 mg by mouth daily.   POLYETHYLENE GLYCOL (MIRALAX / GLYCOLAX) PACKET    Take 17 g by mouth daily. Mix with 8 ounces of water or juice and take by mouth every other day for constipation.   SERTRALINE (ZOLOFT) 50 MG TABLET    One daily to help depression   TAMSULOSIN HCL (FLOMAX) 0.4 MG CAPS    Take 0.4 mg by mouth daily.   TRAMADOL (ULTRAM) 50 MG TABLET    Take two tablets by mouth at bedtime for pain   VOLTAREN 1 % GEL    Apply thin layer to joints twice daily   ZOLPIDEM (AMBIEN) 5 MG TABLET    Take 1/2 tablet by mouth at bedtime for rest  Modified Medications   No medications on file  Discontinued Medications   No medications on file     Review of Systems  Constitutional: Negative.  Negative for activity change, appetite change, fatigue and unexpected  weight change.  HENT: Negative.  Negative for hearing loss, sinus pressure and sore throat.   Eyes:       Hx macular degeneration. Receiving injections every 6-8 weeks.  Respiratory: Positive for cough. Negative for apnea, chest tightness, shortness of breath, wheezing and stridor.   Cardiovascular: Positive for leg swelling. Negative for chest pain and palpitations.  Gastrointestinal: Negative for nausea, vomiting, abdominal pain and abdominal distention.       Flatulence  Endocrine: Negative.   Genitourinary: Negative for urgency, frequency and decreased urine volume.       BPH.  Musculoskeletal: Positive for back pain and gait problem (uunstable. uses walker). Negative for neck pain and neck stiffness.  Skin: Negative.        Dry. Itching on dorsum of hands.  Allergic/Immunologic: Negative.   Neurological: Negative.   Hematological: Negative.   Psychiatric/Behavioral: Positive for sleep disturbance (iinsomnia. Ambien helps.).    Filed Vitals:   07/07/14 0951  BP: 122/60    Pulse: 63  Temp: 97.6 F (36.4 C)  TempSrc: Oral  Weight: 187 lb (84.823 kg)  SpO2: 98%   Body mass index is 28.44 kg/(m^2).  Physical Exam  Constitutional: He is oriented to person, place, and time. He appears well-nourished. No distress.  Frail  HENT:  Head: Normocephalic and atraumatic.  Right Ear: External ear normal.  Left Ear: External ear normal.  Nose: Nose normal.  Mouth/Throat: Oropharynx is clear and moist.  Mild decrease in hearing  Eyes: Conjunctivae and EOM are normal. Pupils are equal, round, and reactive to light.  Neck: No JVD present. No tracheal deviation present. No thyromegaly present.  Cardiovascular: Normal rate, regular rhythm and intact distal pulses.  Exam reveals friction rub. Exam reveals no gallop.   No murmur heard. Pulmonary/Chest: Effort normal and breath sounds normal. No respiratory distress. He has no wheezes. He has no rales.  Abdominal: He exhibits no distension and no mass. There is no tenderness.  Large upper diastasis recti  Musculoskeletal: Normal range of motion. He exhibits edema (1+ bipedal). He exhibits no tenderness.  Using walker. Bilateral knee prosthesis.  Lymphadenopathy:    He has no cervical adenopathy.  Neurological: He is alert and oriented to person, place, and time. He has normal reflexes. Abnormal reflex: Xray was normal. No cranial nerve deficit. Coordination normal.  Diminished sensation to vibration in the right foot.  Skin: Skin is dry. No rash noted. No erythema. No pallor.  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 Yellow and green bruise at right lowwer lateral rib cage.  Multiple SK.  Psychiatric: He has a normal mood and affect. His behavior is normal. Judgment and thought content normal.     Labs reviewed: Clinical Support on 07/02/2014  Component Date Value Ref Range Status  . Date Time Interrogation Session 07/02/2014 [id number]   Final  . Pulse Generator  Manufacturer 07/02/2014 Medtronic   Final  . Pulse Gen Model 07/02/2014 [id number] Jana HalfSensia   Final  . Pulse Gen Serial Number 07/02/2014 [id number]   Final  . RV Sense Sensitivity 07/02/2014 5.6   Final  . RA Pace Amplitude 07/02/2014 2   Final  . RV Pace PulseWidth 07/02/2014 0.4   Final  . RV Pace Amplitude 07/02/2014 2.5   Final  . RA Impedance 07/02/2014 465   Final  . RA Pacing Amplitude 07/02/2014 0.5   Final  . RA Pacing PulseWidth 07/02/2014 0.4   Final  . RV  IMPEDANCE 07/02/2014 676   Final  . RV Pacing Amplitude 07/02/2014 0.625   Final  . RV Pacing PulseWidth 07/02/2014 0.4   Final  . Battery Status 07/02/2014 Unknown   Final  . Battery Longevity 07/02/2014 101   Final  . Battery Voltage 07/02/2014 2.79   Final  . Battery Impedance 07/02/2014 180   Final  . Brady AP VP Percent 07/02/2014 97   Final  . Brady AS VP Percent 07/02/2014 3   Final  . Huston Foley AP VS Percent 07/02/2014 0   Final  . Huston Foley AS VS Percent 07/02/2014 0   Final  . Eval Rhythm 07/02/2014 Ap-Vp   Final  . Miscellaneous Comment 07/02/2014    Final                   Value:Pacemaker remote check. Device function reviewed. Impedance, auto capture thresholds consistent with previous measurements. Histograms appropriate for patient and level of activity. All other diagnostic data reviewed and is appropriate and stable for  patient. Real time/magnet EGM shows appropriate capture. 7 mode switches-- <0.1%. 1 ventricular high rate---was SVT. Estimated longevity 8.33yrs. [facility] 10/01/14 & ROV w/ JA 8/16.      Assessment/Plan  1. Midline low back pain without sciatica Denies fall or injury. Back pain limited to midline in the lumbar area. No radiation into the legs. Tylenol does not help. Start fentanyl 25 g per hour patch change every third day.  2. Essential hypertension Controlled  3. Depression Improved  4. Chronic systolic congestive heart failure Compensated  5. Edema Unchanged  6. Cough Acute  respiratory illness present in other patients in the facility. We have elected simply to watch this for the time being.

## 2014-07-09 ENCOUNTER — Encounter: Payer: Self-pay | Admitting: Internal Medicine

## 2014-07-09 DIAGNOSIS — H3532 Exudative age-related macular degeneration: Secondary | ICD-10-CM | POA: Diagnosis not present

## 2014-07-10 DIAGNOSIS — H4011X1 Primary open-angle glaucoma, mild stage: Secondary | ICD-10-CM | POA: Diagnosis not present

## 2014-07-21 ENCOUNTER — Non-Acute Institutional Stay: Payer: Medicare Other | Admitting: Internal Medicine

## 2014-07-21 ENCOUNTER — Encounter: Payer: Self-pay | Admitting: Internal Medicine

## 2014-07-21 VITALS — BP 124/60 | HR 64 | Temp 97.4°F | Wt 185.0 lb

## 2014-07-21 DIAGNOSIS — I10 Essential (primary) hypertension: Secondary | ICD-10-CM | POA: Diagnosis not present

## 2014-07-21 DIAGNOSIS — R609 Edema, unspecified: Secondary | ICD-10-CM

## 2014-07-21 DIAGNOSIS — M545 Low back pain, unspecified: Secondary | ICD-10-CM

## 2014-07-21 MED ORDER — HYDROCODONE-ACETAMINOPHEN 5-325 MG PO TABS: ORAL_TABLET | ORAL | Status: DC

## 2014-07-21 NOTE — Progress Notes (Signed)
Patient ID: Caleb Nash, male   DOB: [birth year], 81 y.o.   MRN: [id number]    [facility] AL04  Place of Service: Clinic (12)    Allergies  Allergen Reactions  . Celebrex [Celecoxib]     Lips swelled  . Sulfur     Lips swelled  . Vioxx [Rofecoxib] Other (See Comments)    unknown    Chief Complaint  Patient presents with  . Medical Management of Chronic Issues    2 week follow-up midline low back pain after starting Fentanyl patch. Had to stop the patch after first night c/o drowseness, and feeling foggy. Sleeps in recliner, can't sleep on left side    HPI:  Midline low back pain without sciatica patient continues a low back discomfort. He was started on a fentanyl patch 25 g per hour at his last visit. He only tolerated the patch application on one occasion. He had it removed prior to the 3 days because of feelings of drowsiness, fogginess, and gait instability. He also says the tramadol that he is currently using is not helpful.  Essential hypertension: Controlled    Medications: Patient's Medications  New Prescriptions   No medications on file  Previous Medications   ACETAMINOPHEN (TYLENOL) 650 MG CR TABLET    Take 650 mg by mouth. Take one tablet twice daily for pain   AMOXICILLIN (AMOXIL) 500 MG CAPSULE    Take 2,000 mg by mouth daily as needed (prior to surgery).   ASPIRIN 81 MG TABLET    Take 81 mg by mouth daily.   BIMATOPROST (LUMIGAN) 0.01 % SOLN    Place 1 drop into both eyes at bedtime.    CARBOXYMETHYLCELLULOSE (REFRESH PLUS) 0.5 % SOLN    1 drop. Both eyes four times daily as needed for dry eyes   FINASTERIDE (PROSCAR) 5 MG TABLET    Take one tablet by mouth once daily   FLUTICASONE (FLONASE) 50 MCG/ACT NASAL SPRAY    Place 1 spray into both nostrils 2 (two) times daily.   FUROSEMIDE (LASIX) 20 MG TABLET    Take one tablet by mouth once daily. One twice weekly as needed for increased weight 2-3 lbs   GUAIFENESIN  (MUCINEX) 600 MG 12 HR TABLET    Take by mouth 2 (two) times daily.   KLOR-CON M10 10 MEQ TABLET    Take 1 tablet twice weekly as needed with Lasix   LISINOPRIL (PRINIVIL,ZESTRIL) 20 MG TABLET    Take one tablet twice daily for blood pressure   LORATADINE (CLARITIN) 10 MG TABLET    Take 10 mg by mouth daily.   MULTIPLE VITAMINS-MINERALS (ICAPS PO)    Take by mouth. Take one daily   NITROGLYCERIN (NITROSTAT) 0.4 MG SL TABLET    Place 0.4 mg under the tongue every 5 (five) minutes as needed. For chest pain   OMEPRAZOLE (PRILOSEC) 20 MG CAPSULE    Take 20 mg by mouth daily.   POLYETHYLENE GLYCOL (MIRALAX / GLYCOLAX) PACKET    Take 17 g by mouth daily. Mix with 8 ounces of water or juice and take by mouth every other day for constipation.   SERTRALINE (ZOLOFT) 50 MG TABLET    One daily to help depression   TAMSULOSIN HCL (FLOMAX) 0.4 MG CAPS    Take 0.4 mg by mouth daily.   TRAMADOL (ULTRAM) 50 MG TABLET    Take two tablets by mouth at bedtime for pain   VOLTAREN  1 % GEL    Apply thin layer to joints twice daily   ZOLPIDEM (AMBIEN) 5 MG TABLET    Take 1/2 tablet by mouth at bedtime for rest  Modified Medications   No medications on file  Discontinued Medications   No medications on file     Review of Systems  Constitutional: Negative.  Negative for activity change, appetite change, fatigue and unexpected weight change.  HENT: Negative.  Negative for hearing loss, sinus pressure and sore throat.   Eyes:       Hx macular degeneration. Receiving injections every 6-8 weeks.  Respiratory: Positive for cough. Negative for apnea, chest tightness, shortness of breath, wheezing and stridor.   Cardiovascular: Positive for leg swelling. Negative for chest pain and palpitations.  Gastrointestinal: Negative for nausea, vomiting, abdominal pain and abdominal distention.       Flatulence  Endocrine: Negative.   Genitourinary: Negative for urgency, frequency and decreased urine volume.       BPH.    Musculoskeletal: Positive for back pain and gait problem (uunstable. uses walker). Negative for neck pain and neck stiffness.  Skin: Negative.        Dry. Itching on dorsum of hands.  Allergic/Immunologic: Negative.   Neurological: Negative.   Hematological: Negative.   Psychiatric/Behavioral: Positive for sleep disturbance (iinsomnia. Ambien helps.).    Filed Vitals:   07/21/14 1055  BP: 124/60  Pulse: 64  Temp: 97.4 F (36.3 C)  TempSrc: Oral  Weight: 185 lb (83.915 kg)  SpO2: 99%   Body mass index is 28.14 kg/(m^2).  Physical Exam  Constitutional: He is oriented to person, place, and time. He appears well-nourished. No distress.  Frail  HENT:  Head: Normocephalic and atraumatic.  Right Ear: External ear normal.  Left Ear: External ear normal.  Nose: Nose normal.  Mouth/Throat: Oropharynx is clear and moist.  Mild decrease in hearing  Eyes: Conjunctivae and EOM are normal. Pupils are equal, round, and reactive to light.  Neck: No JVD present. No tracheal deviation present. No thyromegaly present.  Cardiovascular: Normal rate, regular rhythm and intact distal pulses.  Exam reveals friction rub. Exam reveals no gallop.   No murmur heard. Pulmonary/Chest: Effort normal and breath sounds normal. No respiratory distress. He has no wheezes. He has no rales.  Abdominal: He exhibits no distension and no mass. There is no tenderness.  Large upper diastasis recti  Musculoskeletal: Normal range of motion. He exhibits edema (1+ bipedal). He exhibits no tenderness.  Using walker. Bilateral knee prosthesis.  Lymphadenopathy:    He has no cervical adenopathy.  Neurological: He is alert and oriented to person, place, and time. He has normal reflexes. Abnormal reflex: Xray was normal. No cranial nerve deficit. Coordination normal.  Diminished sensation to vibration in the right foot.  Skin: Skin is dry. No rash noted. No erythema. No pallor.  Scars at sternum from CABG, right scapula  from cyst removal, and both knees from TKR. Yellow and Radley Barto bruise at right lowwer lateral rib cage.  Multiple SK.  Psychiatric: He has a normal mood and affect. His behavior is normal. Judgment and thought content normal.     Labs reviewed: Clinical Support on 07/02/2014  Component Date Value Ref Range Status  . Date Time Interrogation Session 07/02/2014 [id number]   Final  . Pulse Generator Manufacturer 07/02/2014 Medtronic   Final  . Pulse Gen Model 07/02/2014 [id number] Jana Half   Final  . Pulse Gen Serial Number 07/02/2014 [id number]   Final  .  RV Sense Sensitivity 07/02/2014 5.6   Final  . RA Pace Amplitude 07/02/2014 2   Final  . RV Pace PulseWidth 07/02/2014 0.4   Final  . RV Pace Amplitude 07/02/2014 2.5   Final  . RA Impedance 07/02/2014 465   Final  . RA Pacing Amplitude 07/02/2014 0.5   Final  . RA Pacing PulseWidth 07/02/2014 0.4   Final  . RV IMPEDANCE 07/02/2014 676   Final  . RV Pacing Amplitude 07/02/2014 0.625   Final  . RV Pacing PulseWidth 07/02/2014 0.4   Final  . Battery Status 07/02/2014 Unknown   Final  . Battery Longevity 07/02/2014 101   Final  . Battery Voltage 07/02/2014 2.79   Final  . Battery Impedance 07/02/2014 180   Final  . Brady AP VP Percent 07/02/2014 97   Final  . Brady AS VP Percent 07/02/2014 3   Final  . Huston Foley AP VS Percent 07/02/2014 0   Final  . Huston Foley AS VS Percent 07/02/2014 0   Final  . Eval Rhythm 07/02/2014 Ap-Vp   Final  . Miscellaneous Comment 07/02/2014    Final                   Value:Pacemaker remote check. Device function reviewed. Impedance, auto capture thresholds consistent with previous measurements. Histograms appropriate for patient and level of activity. All other diagnostic data reviewed and is appropriate and stable for  patient. Real time/magnet EGM shows appropriate capture. 7 mode switches-- <0.1%. 1 ventricular high rate---was SVT. Estimated longevity 8.36yrs. Carelink 10/01/14 & ROV w/ JA 8/16.       Assessment/Plan  1. Midline low back pain without sciatica - HYDROcodone-acetaminophen (NORCO/VICODIN) 5-325 MG per tablet; Take one tablet every 4 hours as needed to control pain  Dispense: 120 tablet; Refill: 0  2. Essential hypertension Controlled  3. Edema He started wearing compression stockings again. This has helped the swelling somewhat.

## 2014-08-04 ENCOUNTER — Non-Acute Institutional Stay: Payer: Medicare Other | Admitting: Internal Medicine

## 2014-08-04 ENCOUNTER — Other Ambulatory Visit: Payer: Self-pay | Admitting: *Deleted

## 2014-08-04 ENCOUNTER — Encounter: Payer: Self-pay | Admitting: Internal Medicine

## 2014-08-04 VITALS — BP 124/60 | HR 64 | Temp 97.9°F | Wt 184.0 lb

## 2014-08-04 DIAGNOSIS — I10 Essential (primary) hypertension: Secondary | ICD-10-CM | POA: Diagnosis not present

## 2014-08-04 DIAGNOSIS — R609 Edema, unspecified: Secondary | ICD-10-CM | POA: Diagnosis not present

## 2014-08-04 DIAGNOSIS — M545 Low back pain, unspecified: Secondary | ICD-10-CM

## 2014-08-04 DIAGNOSIS — L89152 Pressure ulcer of sacral region, stage 2: Secondary | ICD-10-CM | POA: Diagnosis not present

## 2014-08-04 MED ORDER — TRAMADOL HCL 50 MG PO TABS: ORAL_TABLET | ORAL | Status: DC

## 2014-08-04 NOTE — Telephone Encounter (Signed)
[facility] 

## 2014-08-04 NOTE — Progress Notes (Addendum)
Patient ID: [name], male   DOB: 07/03/1913, 81 y.o.   MRN: [id number]    [facility]  Nursing Home Room Number: AL 4 Place of Service: Clinic (12)     Allergies  Allergen Reactions  . Celebrex [Celecoxib]     Lips swelled  . Sulfur     Lips swelled  . Vioxx [Rofecoxib] Other (See Comments)    unknown    Chief Complaint  Patient presents with  . Medical Management of Chronic Issues    2 week follow up midline back pain. Stopped Hydrocodone "made him foggy". Taking Ultram one in morning, 2 at bedtime. Sets up to sleep because of pain.   Marland Kitchen sores    bilateral on buttocks from setting   . Leg Pain    left leg, trouble lifting it    HPI:  Midline low back pain without sciatica:  Continues with back pains. Has complained of a foggy feeling on fentanyl patch and now on Vicodin. He was put back on tramadol, but does not find this particularly helpful. Patient has been sitting up in a recliner chair due to difficulty in laying down because of the back pain.  Edema: Chronically swollen legs and feet at about 2+ bipedal  Essential hypertension: Controlled  Decubitus ulcer of sacral region, stage 2: New finding by staff and estimated to be about 81 weeks old. Caleb Nash has been used, but it is wrinkling and creating discomfort.    Medications: Patient's Medications  New Prescriptions   No medications on file  Previous Medications   ACETAMINOPHEN (TYLENOL) 650 MG CR TABLET    Take 650 mg by mouth. Take one tablet twice daily for pain   AMOXICILLIN (AMOXIL) 500 MG CAPSULE    Take 2,000 mg by mouth daily as needed (prior to surgery).   ASPIRIN 81 MG TABLET    Take 81 mg by mouth daily.   BIMATOPROST (LUMIGAN) 0.01 % SOLN    Place 1 drop into both eyes at bedtime.    CARBOXYMETHYLCELLULOSE (REFRESH PLUS) 0.5 % SOLN    1 drop. Both eyes four times daily as needed for dry eyes   FINASTERIDE (PROSCAR) 5 MG TABLET    Take one tablet by mouth once daily   FLUTICASONE  (FLONASE) 50 MCG/ACT NASAL SPRAY    Place 1 spray into both nostrils 2 (two) times daily.   FUROSEMIDE (LASIX) 20 MG TABLET    Take one tablet by mouth once daily. One twice weekly as needed for increased weight 2-3 lbs   GUAIFENESIN (MUCINEX) 600 MG 12 HR TABLET    Take by mouth 2 (two) times daily.   HYDROCODONE-ACETAMINOPHEN (NORCO/VICODIN) 5-325 MG PER TABLET    Take one tablet every 4 hours as needed to control pain   KLOR-CON M10 10 MEQ TABLET    Take 1 tablet twice weekly as needed with Lasix   LISINOPRIL (PRINIVIL,ZESTRIL) 20 MG TABLET    Take one tablet twice daily for blood pressure   LORATADINE (CLARITIN) 10 MG TABLET    Take 10 mg by mouth daily.   MULTIPLE VITAMINS-MINERALS (ICAPS PO)    Take by mouth. Take one daily   NITROGLYCERIN (NITROSTAT) 0.4 MG SL TABLET    Place 0.4 mg under the tongue every 5 (five) minutes as needed. For chest pain   OMEPRAZOLE (PRILOSEC) 20 MG CAPSULE    Take 20 mg by mouth daily.   POLYETHYLENE GLYCOL (MIRALAX / GLYCOLAX) PACKET    Take 17  g by mouth daily. Mix with 8 ounces of water or juice and take by mouth every other day for constipation.   SERTRALINE (ZOLOFT) 50 MG TABLET    One daily to help depression   TAMSULOSIN HCL (FLOMAX) 0.4 MG CAPS    Take 0.4 mg by mouth daily.   TRAMADOL (ULTRAM) 50 MG TABLET    Take two tablets by mouth at bedtime for pain   VOLTAREN 1 % GEL    Apply thin layer to joints twice daily   ZOLPIDEM (AMBIEN) 5 MG TABLET    Take 1/2 tablet by mouth at bedtime for rest  Modified Medications   No medications on file  Discontinued Medications   No medications on file     Review of Systems  Constitutional: Negative.  Negative for activity change, appetite change, fatigue and unexpected weight change.  HENT: Negative.  Negative for hearing loss, sinus pressure and sore throat.   Eyes:       Hx macular degeneration. Receiving injections every 6-8 weeks.  Respiratory: Positive for cough. Negative for apnea, chest tightness,  shortness of breath, wheezing and stridor.   Cardiovascular: Positive for leg swelling. Negative for chest pain and palpitations.  Gastrointestinal: Negative for nausea, vomiting, abdominal pain and abdominal distention.       Flatulence  Endocrine: Negative.   Genitourinary: Negative for urgency, frequency and decreased urine volume.       BPH.  Musculoskeletal: Positive for back pain and gait problem (uunstable. uses walker). Negative for neck pain and neck stiffness.       Reviewed x-ray reports from 2009. Patient has foraminal stenoses and possibly spinal stenosis now. There is multiple intervertebral degenerative disc disease. He has had steroid injections at [facility], Dr. Naaman Plummer.  Skin: Negative.        Dry. Itching on dorsum of hands. Sacral decubitus since late March 2016.  Allergic/Immunologic: Negative.   Neurological: Negative.   Hematological: Negative.   Psychiatric/Behavioral: Positive for sleep disturbance (iinsomnia. Ambien helps.).    Filed Vitals:   08/04/14 1026  BP: 124/60  Pulse: 64  Temp: 97.9 F (36.6 C)  TempSrc: Oral  Weight: 184 lb (83.462 kg)   Body mass index is 27.98 kg/(m^2).  Physical Exam  Constitutional: He is oriented to person, place, and time. He appears well-nourished. No distress.  Frail  HENT:  Head: Normocephalic and atraumatic.  Right Ear: External ear normal.  Left Ear: External ear normal.  Nose: Nose normal.  Mouth/Throat: Oropharynx is clear and moist.  Mild decrease in hearing  Eyes: Conjunctivae and EOM are normal. Pupils are equal, round, and reactive to light.  Neck: No JVD present. No tracheal deviation present. No thyromegaly present.  Cardiovascular: Normal rate, regular rhythm and intact distal pulses.  Exam reveals friction rub. Exam reveals no gallop.   No murmur heard. Pulmonary/Chest: Effort normal and breath sounds normal. No respiratory distress. He has no wheezes. He has no rales.    Abdominal: He exhibits no distension and no mass. There is no tenderness.  Large upper diastasis recti  Musculoskeletal: Normal range of motion. He exhibits edema (1+ bipedal). He exhibits no tenderness.  Using walker. Bilateral knee prosthesis.  Lymphadenopathy:    He has no cervical adenopathy.  Neurological: He is alert and oriented to person, place, and time. He has normal reflexes. Abnormal reflex: Xray was normal. No cranial nerve deficit. Coordination normal.  Diminished sensation to vibration in the right foot.  Skin: Skin is  dry. No rash noted. No erythema. No pallor.  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 Multiple SK. Sacral decubitus, stage II.Marland Kitchen.   Psychiatric: He has a normal mood and affect. His behavior is normal. Judgment and thought content normal.     Labs reviewed: Clinical Support on 07/02/2014  Component Date Value Ref Range Status  . Date Time Interrogation Session 07/02/2014 [id number]   Final  . Implantable Pulse Generator Manufa* 07/02/2014 Medtronic   Final  . Implantable Pulse Generator Model 07/02/2014 [id number] Sensia   Final  . Implantable Pulse Generator Serial* 07/02/2014 [id number] H   Final  . Lead Channel Setting Sensing Sensi* 07/02/2014 5.6   Final  . Lead Channel Setting Pacing Amplit* 07/02/2014 2   Final  . Lead Channel Setting Pacing Pulse * 07/02/2014 0.4   Final  . Lead Channel Setting Pacing Amplit* 07/02/2014 2.5   Final  . Lead Channel Impedance Value 07/02/2014 465   Final  . Lead Channel Pacing Threshold Ampl* 07/02/2014 0.5   Final  . Lead Channel Pacing Threshold Puls* 07/02/2014 0.4   Final  . Lead Channel Impedance Value 07/02/2014 676   Final  . Lead Channel Pacing Threshold Ampl* 07/02/2014 0.625   Final  . Lead Channel Pacing Threshold Puls* 07/02/2014 0.4   Final  . Battery Status 07/02/2014 Unknown   Final  . Battery Remaining Longevity 07/02/2014 101   Final  . Battery Voltage  07/02/2014 2.79   Final  . Battery Impedance 07/02/2014 180   Final  . Huston FoleyBrady Statistic AP VP Percent 07/02/2014 97   Final  . Brady Statistic AS VP Percent 07/02/2014 3   Final  . Huston FoleyBrady Statistic AP VS Percent 07/02/2014 0   Final  . Huston FoleyBrady Statistic AS VS Percent 07/02/2014 0   Final  . Eval Rhythm 07/02/2014 Ap-Vp   Final  . Miscellaneous Comment 07/02/2014    Final                   Value:Pacemaker remote check. Device function reviewed. Impedance, auto capture thresholds consistent with previous measurements. Histograms appropriate for patient and level of activity. All other diagnostic data reviewed and is appropriate and stable for  patient. Real time/magnet EGM shows appropriate capture. 7 mode switches-- <0.1%. 1 ventricular high rate---was SVT. Estimated longevity 8.3248yrs. Carelink 10/01/14 & ROV w/ JA 8/16.      Assessment/Plan  1. Midline low back pain without sciatica -Salonpas daily -Refer to previous back surgeon -May benefit from steroid injections again  2. Edema Unchanged  3. Essential hypertension Controlled  4. Decubitus ulcer of sacral region, stage 2 Patient must not sit in his recliner chair. Discontinued Duoderm. Started cleansing and Neosporin ointment treatment twice daily to wound. Recommended he sit up in a firm bottom chair with arms. When sleeping, he must go to bed and lay on one side of the other.

## 2014-08-06 ENCOUNTER — Encounter: Payer: Self-pay | Admitting: Internal Medicine

## 2014-08-11 ENCOUNTER — Encounter: Payer: Self-pay | Admitting: Internal Medicine

## 2014-08-11 ENCOUNTER — Non-Acute Institutional Stay: Payer: Medicare Other | Admitting: Internal Medicine

## 2014-08-11 VITALS — BP 140/60 | HR 68 | Temp 97.4°F | Wt 184.0 lb

## 2014-08-11 DIAGNOSIS — M545 Low back pain, unspecified: Secondary | ICD-10-CM

## 2014-08-11 DIAGNOSIS — L89152 Pressure ulcer of sacral region, stage 2: Secondary | ICD-10-CM | POA: Diagnosis not present

## 2014-08-11 NOTE — Progress Notes (Signed)
Patient ID: Caleb Nash, male   DOB: 10/21/1913, 81 y.o.   MRN: [id number]    [facility] Room Number: 04  Place of Service: Clinic (12)     Allergies  Allergen Reactions  . Celebrex [Celecoxib]     Lips swelled  . Sulfur     Lips swelled  . Vioxx [Rofecoxib] Other (See Comments)    unknown    Chief Complaint  Patient presents with  . Medical Management of Chronic Issues    one week follow-up back pain, decubitus ulcer sacral region. Not sleeping all night in bed.  Has a appointment with Dr. Alvester Morin for back 08/13/14.  . Medication Management    Not using the Salonpas patch or taking the Aleve for pain, he is allergic to Sufur     HPI:  Decubitus ulcer of sacral region, stage 2: Wound is similar in appearance to last week. It has not gotten any deeper. Superficial stage II ulceration. He continues to sleep in his recliner chair because he cannot get comfortable in bed.  Midline low back pain without sciatica: Patient thought one of his medicines might have sulfa and it so he refused to use either the Salonpas for the Aleve. Has an appointment to see Dr. Alvester Morin 08/13/14 for possible spinal injection.    Medications: Patient's Medications  New Prescriptions   No medications on file  Previous Medications   ACETAMINOPHEN (TYLENOL) 650 MG CR TABLET    Take 650 mg by mouth. Take one tablet twice daily for pain   AMOXICILLIN (AMOXIL) 500 MG CAPSULE    Take 2,000 mg by mouth daily as needed (prior to surgery).   ASPIRIN 81 MG TABLET    Take 81 mg by mouth daily.   BIMATOPROST (LUMIGAN) 0.01 % SOLN    Place 1 drop into both eyes at bedtime.    CARBOXYMETHYLCELLULOSE (REFRESH PLUS) 0.5 % SOLN    1 drop. Both eyes four times daily as needed for dry eyes   FINASTERIDE (PROSCAR) 5 MG TABLET    Take one tablet by mouth once daily   FLUTICASONE (FLONASE) 50 MCG/ACT NASAL SPRAY    Place 1 spray into both nostrils 2 (two) times daily.   FUROSEMIDE (LASIX) 20 MG  TABLET    Take one tablet by mouth once daily. One twice weekly as needed for increased weight 2-3 lbs   GUAIFENESIN (MUCINEX) 600 MG 12 HR TABLET    Take by mouth 2 (two) times daily.   KLOR-CON M10 10 MEQ TABLET    Take 1 tablet twice weekly as needed with Lasix   LISINOPRIL (PRINIVIL,ZESTRIL) 20 MG TABLET    Take one tablet twice daily for blood pressure   LORATADINE (CLARITIN) 10 MG TABLET    Take 10 mg by mouth daily.   MULTIPLE VITAMINS-MINERALS (ICAPS PO)    Take by mouth. Take one daily   NITROGLYCERIN (NITROSTAT) 0.4 MG SL TABLET    Place 0.4 mg under the tongue every 5 (five) minutes as needed. For chest pain   OMEPRAZOLE (PRILOSEC) 20 MG CAPSULE    Take 20 mg by mouth daily.   POLYETHYL GLYCOL-PROPYL GLYCOL 0.4-0.3 % SOLN    Apply to eye. One drop both eyes twice daily   POLYETHYLENE GLYCOL (MIRALAX / GLYCOLAX) PACKET    Take 17 g by mouth daily. Mix with 8 ounces of water or juice and take by mouth every other day for constipation.   SERTRALINE (ZOLOFT) 50 MG  TABLET    One daily to help depression   TAMSULOSIN HCL (FLOMAX) 0.4 MG CAPS    Take 0.4 mg by mouth daily.   TRAMADOL (ULTRAM) 50 MG TABLET    Take two tablets by mouth every morning and every evening for pain   VOLTAREN 1 % GEL    Apply thin layer to joints twice daily   ZOLPIDEM (AMBIEN) 5 MG TABLET    Take 1/2 tablet by mouth at bedtime for rest  Modified Medications   No medications on file  Discontinued Medications   No medications on file     Review of Systems  Constitutional: Negative.  Negative for activity change, appetite change, fatigue and unexpected weight change.  HENT: Negative.  Negative for hearing loss, sinus pressure and sore throat.   Eyes:       Hx macular degeneration. Receiving injections every 6-8 weeks.  Respiratory: Positive for cough. Negative for apnea, chest tightness, shortness of breath, wheezing and stridor.   Cardiovascular: Positive for leg swelling. Negative for chest pain and  palpitations.  Gastrointestinal: Negative for nausea, vomiting, abdominal pain and abdominal distention.       Flatulence  Endocrine: Negative.   Genitourinary: Negative for urgency, frequency and decreased urine volume.       BPH.  Musculoskeletal: Positive for back pain and gait problem (uunstable. uses walker). Negative for neck pain and neck stiffness.       Reviewed x-ray reports from 2009. Patient has foraminal stenoses and possibly spinal stenosis now. There is multiple intervertebral degenerative disc disease. He has had steroid injections at [facility], Dr. Naaman Plummer.  Skin: Negative.        Dry. Itching on dorsum of hands. Sacral decubitus since late March 2016.  Allergic/Immunologic: Negative.   Neurological: Negative.   Hematological: Negative.   Psychiatric/Behavioral: Positive for sleep disturbance (iinsomnia. Ambien helps.).    Filed Vitals:   08/11/14 1003  BP: 140/60  Pulse: 68  Temp: 97.4 F (36.3 C)  TempSrc: Oral  Weight: 184 lb (83.462 kg)   Body mass index is 27.98 kg/(m^2).  Physical Exam  Constitutional: He is oriented to person, place, and time. He appears well-nourished. No distress.  Frail  HENT:  Head: Normocephalic and atraumatic.  Right Ear: External ear normal.  Left Ear: External ear normal.  Nose: Nose normal.  Mouth/Throat: Oropharynx is clear and moist.  Mild decrease in hearing  Eyes: Conjunctivae and EOM are normal. Pupils are equal, round, and reactive to light.  Neck: No JVD present. No tracheal deviation present. No thyromegaly present.  Cardiovascular: Normal rate, regular rhythm and intact distal pulses.  Exam reveals friction rub. Exam reveals no gallop.   No murmur heard. Pulmonary/Chest: Effort normal and breath sounds normal. No respiratory distress. He has no wheezes. He has no rales.  Abdominal: He exhibits no distension and no mass. There is no tenderness.  Large upper diastasis recti  Musculoskeletal:  Normal range of motion. He exhibits edema (1+ bipedal). He exhibits no tenderness.  Using walker. Bilateral knee prosthesis.  Lymphadenopathy:    He has no cervical adenopathy.  Neurological: He is alert and oriented to person, place, and time. He has normal reflexes. Abnormal reflex: Xray was normal. No cranial nerve deficit. Coordination normal.  Diminished sensation to vibration in the right foot.  Skin: Skin is dry. No rash noted. No erythema. No pallor.  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 Multiple  SK. Sacral decubitus, stage II.Marland Kitchen.   Psychiatric: He has a normal mood and affect. His behavior is normal. Judgment and thought content normal.     Labs reviewed: Clinical Support on 07/02/2014  Component Date Value Ref Range Status  . Date Time Interrogation Session 07/02/2014 [id number]   Final  . Implantable Pulse Generator Manufa* 07/02/2014 Medtronic   Final  . Implantable Pulse Generator Model 07/02/2014 SEDR01 Sensia   Final  . Implantable Pulse Generator Serial* 07/02/2014 [id number] H   Final  . Lead Channel Setting Sensing Sensi* 07/02/2014 5.6   Final  . Lead Channel Setting Pacing Amplit* 07/02/2014 2   Final  . Lead Channel Setting Pacing Pulse * 07/02/2014 0.4   Final  . Lead Channel Setting Pacing Amplit* 07/02/2014 2.5   Final  . Lead Channel Impedance Value 07/02/2014 465   Final  . Lead Channel Pacing Threshold Ampl* 07/02/2014 0.5   Final  . Lead Channel Pacing Threshold Puls* 07/02/2014 0.4   Final  . Lead Channel Impedance Value 07/02/2014 676   Final  . Lead Channel Pacing Threshold Ampl* 07/02/2014 0.625   Final  . Lead Channel Pacing Threshold Puls* 07/02/2014 0.4   Final  . Battery Status 07/02/2014 Unknown   Final  . Battery Remaining Longevity 07/02/2014 101   Final  . Battery Voltage 07/02/2014 2.79   Final  . Battery Impedance 07/02/2014 180   Final  . Huston FoleyBrady Statistic AP VP Percent 07/02/2014 97   Final   . Brady Statistic AS VP Percent 07/02/2014 3   Final  . Huston FoleyBrady Statistic AP VS Percent 07/02/2014 0   Final  . Huston FoleyBrady Statistic AS VS Percent 07/02/2014 0   Final  . Eval Rhythm 07/02/2014 Ap-Vp   Final  . Miscellaneous Comment 07/02/2014    Final                   Value:Pacemaker remote check. Device function reviewed. Impedance, auto capture thresholds consistent with previous measurements. Histograms appropriate for patient and level of activity. All other diagnostic data reviewed and is appropriate and stable for  patient. Real time/magnet EGM shows appropriate capture. 7 mode switches-- <0.1%. 1 ventricular high rate---was SVT. Estimated longevity 8.8916yrs. Carelink 10/01/14 & ROV w/ JA 8/16.      Assessment/Plan  1. Decubitus ulcer of sacral region, stage 2 Patient was again advised about pressure relief over his decubitus ulcer. I advised him to purchase a [employer] Cush style cushion with a cut out at the sacrum.  2. Midline low back pain without sciatica Resume Aleve and Salonpas in the lumbar area.

## 2014-08-13 DIAGNOSIS — M545 Low back pain: Secondary | ICD-10-CM | POA: Diagnosis not present

## 2014-08-13 DIAGNOSIS — M961 Postlaminectomy syndrome, not elsewhere classified: Secondary | ICD-10-CM | POA: Diagnosis not present

## 2014-08-13 DIAGNOSIS — M47816 Spondylosis without myelopathy or radiculopathy, lumbar region: Secondary | ICD-10-CM | POA: Diagnosis not present

## 2014-08-13 DIAGNOSIS — M4306 Spondylolysis, lumbar region: Secondary | ICD-10-CM | POA: Diagnosis not present

## 2014-08-24 DIAGNOSIS — M47816 Spondylosis without myelopathy or radiculopathy, lumbar region: Secondary | ICD-10-CM | POA: Diagnosis not present

## 2014-08-24 DIAGNOSIS — M545 Low back pain: Secondary | ICD-10-CM | POA: Diagnosis not present

## 2014-08-25 ENCOUNTER — Non-Acute Institutional Stay: Payer: Medicare Other | Admitting: Internal Medicine

## 2014-08-25 ENCOUNTER — Encounter: Payer: Self-pay | Admitting: Internal Medicine

## 2014-08-25 VITALS — BP 132/62 | HR 68 | Temp 97.5°F | Wt 183.0 lb

## 2014-08-25 DIAGNOSIS — R609 Edema, unspecified: Secondary | ICD-10-CM

## 2014-08-25 DIAGNOSIS — I10 Essential (primary) hypertension: Secondary | ICD-10-CM | POA: Diagnosis not present

## 2014-08-25 DIAGNOSIS — M545 Low back pain, unspecified: Secondary | ICD-10-CM

## 2014-08-25 DIAGNOSIS — L89152 Pressure ulcer of sacral region, stage 2: Secondary | ICD-10-CM | POA: Diagnosis not present

## 2014-08-25 NOTE — Progress Notes (Signed)
Patient ID: Caleb Nash, male   DOB: September 24, 1913, 81 y.o.   MRN: [id number]    [facility]  Place of Service: Clinic (12)     Allergies  Allergen Reactions  . Celebrex [Celecoxib]     Lips swelled  . Sulfur     Lips swelled  . Vioxx [Rofecoxib] Other (See Comments)    unknown    Chief Complaint  Patient presents with  . Medical Management of Chronic Issues    2 week f/u secubitus ulcer sacral region, midline low back pain    HPI:  Decubitus ulcer of sacral region, stage 2: Healing stage II sacral decubitus. Patient is doing his best to try to relieve pressure. He required a wedge cushion with sacral cut out. He says  Essential hypertension: controlled  Edema: Improved on compression stockings  Midline low back pain without sciatica: [place]. [place], ortho, for epidural spinal injection. This was received gastric day in ER and he has experienced some relief. He is more mobile within the facility. He is still uncomfortable in bed at night but anticipates further improvement and he will try sleeping in his bed again.    Medications: Patient's Medications  New Prescriptions   No medications on file  Previous Medications   ACETAMINOPHEN (TYLENOL) 650 MG CR TABLET    Take 650 mg by mouth. Take one tablet twice daily for pain   AMOXICILLIN (AMOXIL) 500 MG CAPSULE    Take 2,000 mg by mouth daily as needed (prior to surgery).   ASPIRIN 81 MG TABLET    Take 81 mg by mouth daily.   BIMATOPROST (LUMIGAN) 0.01 % SOLN    Place 1 drop into both eyes at bedtime.    CARBOXYMETHYLCELLULOSE (REFRESH PLUS) 0.5 % SOLN    1 drop. Both eyes four times daily as needed for dry eyes   FINASTERIDE (PROSCAR) 5 MG TABLET    Take one tablet by mouth once daily   FLUTICASONE (FLONASE) 50 MCG/ACT NASAL SPRAY    Place 1 spray into both nostrils 2 (two) times daily.   FUROSEMIDE (LASIX) 20 MG TABLET    Take one tablet by mouth once daily. One twice  weekly as needed for increased weight 2-3 lbs   GUAIFENESIN (MUCINEX) 600 MG 12 HR TABLET    Take by mouth 2 (two) times daily.   KLOR-CON M10 10 MEQ TABLET    Take 1 tablet twice weekly as needed with Lasix   LINIMENTS (SALONPAS) PADS    Apply topically. Apply fresh patch daily to lower back   LISINOPRIL (PRINIVIL,ZESTRIL) 20 MG TABLET    Take one tablet twice daily for blood pressure   LORATADINE (CLARITIN) 10 MG TABLET    Take 10 mg by mouth daily.   MULTIPLE VITAMINS-MINERALS (ICAPS PO)    Take by mouth. Take one daily   NAPROXEN SODIUM (ANAPROX) 220 MG TABLET    Take 220 mg by mouth 2 (two) times daily with a meal.   NITROGLYCERIN (NITROSTAT) 0.4 MG SL TABLET    Place 0.4 mg under the tongue every 5 (five) minutes as needed. For chest pain   OMEPRAZOLE (PRILOSEC) 20 MG CAPSULE    Take 20 mg by mouth daily.   POLYETHYL GLYCOL-PROPYL GLYCOL 0.4-0.3 % SOLN    Apply to eye. One drop both eyes twice daily   POLYETHYLENE GLYCOL (MIRALAX / GLYCOLAX) PACKET    Take 17 g by mouth daily. Mix with  8 ounces of water or juice and take by mouth every other day for constipation.   SERTRALINE (ZOLOFT) 50 MG TABLET    One daily to help depression   TAMSULOSIN HCL (FLOMAX) 0.4 MG CAPS    Take 0.4 mg by mouth daily.   TRAMADOL (ULTRAM) 50 MG TABLET    Take two tablets by mouth every morning and every evening for pain   VOLTAREN 1 % GEL    Apply thin layer to joints twice daily   ZOLPIDEM (AMBIEN) 5 MG TABLET    Take 1/2 tablet by mouth at bedtime for rest  Modified Medications   No medications on file  Discontinued Medications   No medications on file     Review of Systems  Constitutional: Negative.  Negative for activity change, appetite change, fatigue and unexpected weight change.  HENT: Negative.  Negative for hearing loss, sinus pressure and sore throat.   Eyes:       Hx macular degeneration. Receiving injections every 6-8 weeks.  Respiratory: Positive for cough. Negative for apnea, chest  tightness, shortness of breath, wheezing and stridor.   Cardiovascular: Positive for leg swelling. Negative for chest pain and palpitations.  Gastrointestinal: Negative for nausea, vomiting, abdominal pain and abdominal distention.       Flatulence  Endocrine: Negative.   Genitourinary: Negative for urgency, frequency and decreased urine volume.       BPH.  Musculoskeletal: Positive for back pain and gait problem (uunstable. uses walker). Negative for neck pain and neck stiffness.       Reviewed x-ray reports from 2009. Patient has foraminal stenoses and possibly spinal stenosis now. There is multiple intervertebral degenerative disc disease. He has had steroid injections at [facility], Dr. Naaman Plummer.  Skin: Negative.        Dry. Itching on dorsum of hands. Sacral decubitus since late March 2016.  Allergic/Immunologic: Negative.   Neurological: Negative.   Hematological: Negative.   Psychiatric/Behavioral: Positive for sleep disturbance (iinsomnia. Ambien helps.).    Filed Vitals:   08/25/14 0939  BP: 132/62  Pulse: 68  Temp: 97.5 F (36.4 C)  TempSrc: Oral  Weight: 183 lb (83.008 kg)  SpO2: 99%   Body mass index is 27.83 kg/(m^2).  Physical Exam  Constitutional: He is oriented to person, place, and time. He appears well-nourished. No distress.  Frail  HENT:  Head: Normocephalic and atraumatic.  Right Ear: External ear normal.  Left Ear: External ear normal.  Nose: Nose normal.  Mouth/Throat: Oropharynx is clear and moist.  Mild decrease in hearing  Eyes: Conjunctivae and EOM are normal. Pupils are equal, round, and reactive to light.  Neck: No JVD present. No tracheal deviation present. No thyromegaly present.  Cardiovascular: Normal rate, regular rhythm and intact distal pulses.  Exam reveals friction rub. Exam reveals no gallop.   No murmur heard. Pulmonary/Chest: Effort normal and breath sounds normal. No respiratory distress. He has no wheezes.  He has no rales.  Abdominal: He exhibits no distension and no mass. There is no tenderness.  Large upper diastasis recti  Musculoskeletal: Normal range of motion. He exhibits edema (1+ bipedal). He exhibits no tenderness.  Using walker. Bilateral knee prosthesis.  Lymphadenopathy:    He has no cervical adenopathy.  Neurological: He is alert and oriented to person, place, and time. He has normal reflexes. Abnormal reflex: Xray was normal. No cranial nerve deficit. Coordination normal.  Diminished sensation to vibration in the right foot.  Skin: Skin is dry.  No rash noted. No erythema. No pallor.  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 Multiple SK. Sacral decubitus, stage II.Marland Kitchen   Psychiatric: He has a normal mood and affect. His behavior is normal. Judgment and thought content normal.     Labs reviewed: Clinical Support on 07/02/2014  Component Date Value Ref Range Status  . Date Time Interrogation Session 07/02/2014 [id number]   Final  . Implantable Pulse Generator Manufa* 07/02/2014 Medtronic   Final  . Implantable Pulse Generator Model 07/02/2014 SEDR01 Sensia   Final  . Implantable Pulse Generator Serial* 07/02/2014 [id number]   Final  . Lead Channel Setting Sensing Sensi* 07/02/2014 5.6   Final  . Lead Channel Setting Pacing Amplit* 07/02/2014 2   Final  . Lead Channel Setting Pacing Pulse * 07/02/2014 0.4   Final  . Lead Channel Setting Pacing Amplit* 07/02/2014 2.5   Final  . Lead Channel Impedance Value 07/02/2014 465   Final  . Lead Channel Pacing Threshold Ampl* 07/02/2014 0.5   Final  . Lead Channel Pacing Threshold Puls* 07/02/2014 0.4   Final  . Lead Channel Impedance Value 07/02/2014 676   Final  . Lead Channel Pacing Threshold Ampl* 07/02/2014 0.625   Final  . Lead Channel Pacing Threshold Puls* 07/02/2014 0.4   Final  . Battery Status 07/02/2014 Unknown   Final  . Battery Remaining Longevity 07/02/2014 101   Final    . Battery Voltage 07/02/2014 2.79   Final  . Battery Impedance 07/02/2014 180   Final  . Huston Foley Statistic AP VP Percent 07/02/2014 97   Final  . Brady Statistic AS VP Percent 07/02/2014 3   Final  . Huston Foley Statistic AP VS Percent 07/02/2014 0   Final  . Huston Foley Statistic AS VS Percent 07/02/2014 0   Final  . Eval Rhythm 07/02/2014 Ap-Vp   Final  . Miscellaneous Comment 07/02/2014    Final                   Value:Pacemaker remote check. Device function reviewed. Impedance, auto capture thresholds consistent with previous measurements. Histograms appropriate for patient and level of activity. All other diagnostic data reviewed and is appropriate and stable for  patient. Real time/magnet EGM shows appropriate capture. 7 mode switches-- <0.1%. 1 ventricular high rate---was SVT. Estimated longevity 8.74yrs. Carelink 10/01/14 & ROV w/ JA 8/16.      Assessment/Plan  1. Decubitus ulcer of sacral region, stage 2 Continue to avoid pressure in this area. Continue current therapies.  2. Essential hypertension Controlled  3. Edema Improved by using compression stockings  4. Midline low back pain without sciatica Improved following epidural injection 08/23/2014

## 2014-09-09 DIAGNOSIS — M791 Myalgia: Secondary | ICD-10-CM | POA: Diagnosis not present

## 2014-09-09 DIAGNOSIS — M545 Low back pain: Secondary | ICD-10-CM | POA: Diagnosis not present

## 2014-10-01 ENCOUNTER — Encounter: Payer: Self-pay | Admitting: Internal Medicine

## 2014-10-01 ENCOUNTER — Ambulatory Visit (INDEPENDENT_AMBULATORY_CARE_PROVIDER_SITE_OTHER): Payer: Medicare Other | Admitting: *Deleted

## 2014-10-01 DIAGNOSIS — I495 Sick sinus syndrome: Secondary | ICD-10-CM | POA: Diagnosis not present

## 2014-10-01 NOTE — Progress Notes (Signed)
Remote pacemaker transmission.   

## 2014-10-05 LAB — CUP PACEART REMOTE DEVICE CHECK
Battery Impedance: 205 Ohm
Brady Statistic AP VP Percent: 96 %
Brady Statistic AP VS Percent: 0 %
Brady Statistic AS VS Percent: 0 %
Lead Channel Impedance Value: 453 Ohm
Lead Channel Impedance Value: 676 Ohm
Lead Channel Pacing Threshold Amplitude: 0.5 V
Lead Channel Pacing Threshold Amplitude: 0.625 V
Lead Channel Pacing Threshold Pulse Width: 0.4 ms
Lead Channel Setting Pacing Amplitude: 2.5 V
MDC IDC MSMT BATTERY REMAINING LONGEVITY: 97 mo
MDC IDC MSMT BATTERY VOLTAGE: 2.79 V
MDC IDC MSMT LEADCHNL RV PACING THRESHOLD PULSEWIDTH: 0.4 ms
MDC IDC SESS DTM: 20160609135432
MDC IDC SET LEADCHNL RA PACING AMPLITUDE: 2 V
MDC IDC SET LEADCHNL RV PACING PULSEWIDTH: 0.4 ms
MDC IDC SET LEADCHNL RV SENSING SENSITIVITY: 5.6 mV
MDC IDC STAT BRADY AS VP PERCENT: 4 %

## 2014-10-06 DIAGNOSIS — L97519 Non-pressure chronic ulcer of other part of right foot with unspecified severity: Secondary | ICD-10-CM | POA: Diagnosis not present

## 2014-10-06 DIAGNOSIS — M79674 Pain in right toe(s): Secondary | ICD-10-CM | POA: Diagnosis not present

## 2014-10-08 DIAGNOSIS — H35053 Retinal neovascularization, unspecified, bilateral: Secondary | ICD-10-CM | POA: Diagnosis not present

## 2014-10-08 DIAGNOSIS — H3532 Exudative age-related macular degeneration: Secondary | ICD-10-CM | POA: Diagnosis not present

## 2014-10-09 DIAGNOSIS — H4011X1 Primary open-angle glaucoma, mild stage: Secondary | ICD-10-CM | POA: Diagnosis not present

## 2014-10-13 ENCOUNTER — Encounter: Payer: Self-pay | Admitting: Cardiology

## 2014-10-16 ENCOUNTER — Non-Acute Institutional Stay: Payer: Medicare Other | Admitting: Nurse Practitioner

## 2014-10-16 ENCOUNTER — Encounter: Payer: Self-pay | Admitting: Nurse Practitioner

## 2014-10-16 DIAGNOSIS — L89152 Pressure ulcer of sacral region, stage 2: Secondary | ICD-10-CM

## 2014-10-16 DIAGNOSIS — N400 Enlarged prostate without lower urinary tract symptoms: Secondary | ICD-10-CM | POA: Diagnosis not present

## 2014-10-16 DIAGNOSIS — K5900 Constipation, unspecified: Secondary | ICD-10-CM | POA: Diagnosis not present

## 2014-10-16 DIAGNOSIS — F32A Depression, unspecified: Secondary | ICD-10-CM

## 2014-10-16 DIAGNOSIS — F329 Major depressive disorder, single episode, unspecified: Secondary | ICD-10-CM

## 2014-10-16 DIAGNOSIS — G4700 Insomnia, unspecified: Secondary | ICD-10-CM | POA: Diagnosis not present

## 2014-10-16 DIAGNOSIS — I509 Heart failure, unspecified: Secondary | ICD-10-CM

## 2014-10-16 DIAGNOSIS — R05 Cough: Secondary | ICD-10-CM

## 2014-10-16 DIAGNOSIS — R059 Cough, unspecified: Secondary | ICD-10-CM

## 2014-10-16 DIAGNOSIS — R609 Edema, unspecified: Secondary | ICD-10-CM

## 2014-10-16 DIAGNOSIS — M5440 Lumbago with sciatica, unspecified side: Secondary | ICD-10-CM

## 2014-10-16 DIAGNOSIS — K219 Gastro-esophageal reflux disease without esophagitis: Secondary | ICD-10-CM | POA: Diagnosis not present

## 2014-10-16 DIAGNOSIS — I10 Essential (primary) hypertension: Secondary | ICD-10-CM | POA: Diagnosis not present

## 2014-10-16 NOTE — Assessment & Plan Note (Signed)
Not sure if Ambien 5mg  helps or not, the patient stated he only sleeps in be 4-5 hours then get up to his chair due to his back pain, will add Norco to better manage back pain, may be able to wane off Ambien

## 2014-10-16 NOTE — Assessment & Plan Note (Addendum)
Chronic, continue Mucinex 600mg  bid and Claritin 10mg  daily.

## 2014-10-16 NOTE — Assessment & Plan Note (Signed)
Tamsulosin 0.4mg  and Finasteride 5mg  daily. Better.

## 2014-10-16 NOTE — Progress Notes (Signed)
Patient ID: Caleb Nash, male   DOB: 05/23/1913, 81 y.o.   MRN: [id number]   Code Status: DNR  Allergies  Allergen Reactions  . Celebrex [Celecoxib]     Lips swelled  . Sulfur     Lips swelled  . Vioxx [Rofecoxib] Other (See Comments)    unknown    Chief Complaint  Patient presents with  . Medical Management of Chronic Issues  . Acute Visit    back pain, left buttock pressure injury    HPI: Patient is a 81 y.o. male seen in the AL at [facility] today for  evaluation of left buttock pressure ulcer, lower back pain,  and other chronic medical conditions Problem List Items Addressed This Visit    Depression (Chronic)    Stable with Sertraline  qhs. Will observe the patient.        CHF (congestive heart failure) (Chronic)    Clinically compensated, continue Furosemide  daily.       Essential hypertension (Chronic)    Controlled, takes Lisinopril  bid and Furosemide  daily.       Lumbago - Primary (Chronic)    01/06/14 X-ray Lumbar spine: no acute osseous or soft tissue abnormality. Multilevel disc degenerative changes on the lumbar spine likely neural foraminal narrowing at L5-S1 and possible the other levels.  10/16/14 dc Tramadol-not adequate, start Norco 5/325 1/2 q8h, Salonpas OTC, the patient stated he only tolerate bedrest 4-5hours then he sits in the chair which may contribute to the left buttock pressure injury      Edema (Chronic)    Trace in BLE, takes Furosemide  daily.       Decubitus ulcer of sacral region, stage 2 (Chronic)    Left buttock, superficial, no s/s of infection, will apply Collagen AG to the affected area q3 days prn, off pressure is must. Obtain CBC, CMP, TSH      BPH (benign prostatic hyperplasia)    Tamsulosin 0.4mg  and Finasteride  daily. Better.       Constipation    Stable, takes MiraLax daily.       GERD (gastroesophageal reflux disease)    Stable, takes Omeprazole  daily.         Insomnia    Not sure if Ambien  helps or not, the patient stated he only sleeps in be 4-5 hours then get up to his chair due to his back pain, will add Norco to better manage back pain, may be able to wane off Ambien       Cough    Chronic, continue Mucinex  bid and Claritin  daily.          Review of Systems:  Review of Systems  Constitutional: Negative.  Negative for activity change, appetite change, fatigue and unexpected weight change.  HENT: Negative.  Negative for hearing loss, sinus pressure and sore throat.   Eyes:       Hx macular degeneration. Receiving injections every 6-8 weeks.  Respiratory: Positive for cough. Negative for apnea, chest tightness, shortness of breath, wheezing and stridor.   Cardiovascular: Positive for leg swelling. Negative for chest pain and palpitations.  Gastrointestinal: Negative for nausea, vomiting, abdominal pain and abdominal distention.       Flatulence  Endocrine: Negative.   Genitourinary: Negative for urgency, frequency and decreased urine volume.       BPH.  Musculoskeletal: Positive for back pain and gait problem (uunstable. uses walker). Negative for neck pain and neck stiffness.  Reviewed x-ray reports from 2009. Patient has foraminal stenoses and possibly spinal stenosis now. There is multiple intervertebral degenerative disc disease. He has had steroid injections at [facility], Dr. Naaman Plummer.  Skin: Negative.        Dry. Itching on dorsum of hands. Left buttock small open area-not infected.   Allergic/Immunologic: Negative.   Neurological: Negative.   Hematological: Negative.   Psychiatric/Behavioral: Positive for sleep disturbance (iinsomnia. Ambien helps.).     Past Medical History  Diagnosis Date  . Osteoarthritis   . HTN (hypertension)   . BPH (benign prostatic hyperplasia)   . Sick sinus syndrome 10/14/2003; 10/29/2012    MDT EnPulse implanted by Dr Amil Amen for SSS; generator change 10/29/2012 by Dr  Johney Frame MDT [employer]ana [employer] pacemaker  . CAD (coronary artery disease)     s/p CABG 2005  . Macular degeneration   . Hyperlipidemia   . Depression     mild per pt  . Sporotrichosis     remote  . History of rhabdomyolysis   . Ingrowing nail 06/11/2012  . Abnormality of gait 06/11/2012  . Major depressive disorder, single episode, unspecified 04/28/2012  . Unspecified glaucoma 04/28/2012  . Coronary atherosclerosis of native coronary artery 04/28/2012  . Atrial fibrillation 04/28/2012  . Congestive heart failure, unspecified 04/28/2012  . Reflux esophagitis 04/28/2012  . Hypertrophy of prostate without urinary obstruction and other lower urinary tract symptoms (LUTS) 04/28/2012  . Insomnia, unspecified 04/28/2012  . Edema 04/28/2012  . Allergy, unspecified not elsewhere classified 04/28/2012  . Personal history of fall 04/28/2012  . Cardiac pacemaker in situ 04/28/2012  . Other specified disease of white blood cells 04/26/2012  . Alcohol abuse 04/26/2012  . Chronic airway obstruction, not elsewhere classified 04/26/2012  . Muscle weakness (generalized) 04/26/2012  . Rhabdomyolysis   . CHF (congestive heart failure) 04/23/2012  . Degenerative joint disease 04/21/2012  . Essential hypertension 10/17/2012  . Fall 04/21/2012    Prolonged downtime 04/20/2012   . GERD (gastroesophageal reflux disease) 09/13/2012  . Hip pain, right 09/02/2013  . Insomnia 11/15/2012  . NSTEMI (non-ST elevated myocardial infarction) 04/23/2012    Related to the stress of rhabdomyolysis and prolonged downtime prior to admission in a patient with known coronary artery disease   . Pacemaker 04/21/2012    Near EOL.   . Paroxysmal atrial tachycardia 04/21/2012    Brief episodes. Not felt to be an anticoagulation candidate because of age and frailty   . Pericarditis 04/21/2012  . Seborrheic eczema 12/03/2012  . Tachy-brady syndrome 04/21/2012    Pacemaker is near EOL   . Unspecified constipation 09/13/2012  . Lumbago    Past Surgical History    Procedure Laterality Date  . Pacemaker insertion  10/14/2003;10/29/2012    MDT Implanted by Dr Amil Amen for SSS; generator change 10/29/2012 by Dr Johney Frame [employer] [place]  . Coronary artery bypass graft  2005  . Bilateral knee arthroplasty  09/28/83; 05/17/1992    right then left  . Appendectomy    . Yag laser application Right 12/26/1973    eye  . Hernia repair Right 05/19/1976  . Transurethral resection of prostate  07/27/1983  . Cystoscopy  08/14/85  . Epididymis surgery  09/17/1985  . Angioplasty  9/11/190    Dr. Katrinka Blazing  . Replacement total knee bilateral  1992 and 1996  . Cataract extraction Right 10/30/1996  . Shoulder open rotator cuff repair Left 02/07/1996  . Back surgery  03/11/2001  . Yag laser application Right 04/30/2001  .  Yag laser application Left 05/07/2001  . Toe surgery Right 2005    correction  . Carpal tunnel release Left   . Elbow surgery      repair  . Carpal tunnel release Right 2007  . Rotator cuff repair Right 2008  . Sigmoidoscopy  08/26/2001  . Pacemaker generator change  10/29/12    Dr. Johney Frame  . Pacemaker generator change N/A 10/29/2012    Procedure: PACEMAKER GENERATOR CHANGE;  Surgeon: Hillis Range, MD;  Location: [facility] CATH LAB;  Service: Cardiovascular;  Laterality: N/A;   Social History:   reports that he has never smoked. He has never used smokeless tobacco. He reports that he does not drink alcohol or use illicit drugs.  Family History  Problem Relation Age of Onset  . Cancer    . CVA Mother   . CVA Father     Medications: Patient's Medications  New Prescriptions   No medications on file  Previous Medications   ACETAMINOPHEN (TYLENOL) 650 MG CR TABLET    Take 650 mg by mouth. Take one tablet twice daily for pain   AMOXICILLIN (AMOXIL) 500 MG CAPSULE    Take 2,000 mg by mouth daily as needed (prior to surgery).   ASPIRIN 81 MG TABLET    Take 81 mg by mouth daily.   BIMATOPROST (LUMIGAN) 0.01 % SOLN    Place 1 drop into both eyes at bedtime.     CARBOXYMETHYLCELLULOSE (REFRESH PLUS) 0.5 % SOLN    1 drop. Both eyes four times daily as needed for dry eyes   FINASTERIDE (PROSCAR) 5 MG TABLET    Take one tablet by mouth once daily   FLUTICASONE (FLONASE) 50 MCG/ACT NASAL SPRAY    Place 1 spray into both nostrils 2 (two) times daily.   FUROSEMIDE (LASIX) 20 MG TABLET    Take one tablet by mouth once daily. One twice weekly as needed for increased weight 2-3 lbs   GUAIFENESIN (MUCINEX) 600 MG 12 HR TABLET    Take by mouth 2 (two) times daily.   KLOR-CON M10 10 MEQ TABLET    Take 1 tablet twice weekly as needed with Lasix   LINIMENTS (SALONPAS) PADS    Apply topically. Apply fresh patch daily to lower back   LISINOPRIL (PRINIVIL,ZESTRIL) 20 MG TABLET    Take one tablet twice daily for blood pressure   LORATADINE (CLARITIN) 10 MG TABLET    Take 10 mg by mouth daily.   MULTIPLE VITAMINS-MINERALS (ICAPS PO)    Take by mouth. Take one daily   NAPROXEN SODIUM (ANAPROX) 220 MG TABLET    Take 220 mg by mouth 2 (two) times daily with a meal.   NITROGLYCERIN (NITROSTAT) 0.4 MG SL TABLET    Place 0.4 mg under the tongue every 5 (five) minutes as needed. For chest pain   OMEPRAZOLE (PRILOSEC) 20 MG CAPSULE    Take 20 mg by mouth daily.   POLYETHYL GLYCOL-PROPYL GLYCOL 0.4-0.3 % SOLN    Apply to eye. One drop both eyes twice daily   POLYETHYLENE GLYCOL (MIRALAX / GLYCOLAX) PACKET    Take 17 g by mouth daily. Mix with 8 ounces of water or juice and take by mouth every other day for constipation.   SERTRALINE (ZOLOFT) 50 MG TABLET    One daily to help depression   TAMSULOSIN HCL (FLOMAX) 0.4 MG CAPS    Take 0.4 mg by mouth daily.   TRAMADOL (ULTRAM) 50 MG TABLET    Take two tablets  by mouth every morning and every evening for pain   VOLTAREN 1 % GEL    Apply thin layer to joints twice daily   ZOLPIDEM (AMBIEN) 5 MG TABLET    Take 1/2 tablet by mouth at bedtime for rest  Modified Medications   No medications on file  Discontinued Medications   No  medications on file     Physical Exam: Physical Exam  Constitutional: He is oriented to person, place, and time. He appears well-nourished. No distress.  Frail  HENT:  Head: Normocephalic and atraumatic.  Right Ear: External ear normal.  Left Ear: External ear normal.  Nose: Nose normal.  Mouth/Throat: Oropharynx is clear and moist.  Mild decrease in hearing  Eyes: Conjunctivae and EOM are normal. Pupils are equal, round, and reactive to light.  Neck: No JVD present. No tracheal deviation present. No thyromegaly present.  Cardiovascular: Normal rate, regular rhythm and intact distal pulses.  Exam reveals friction rub. Exam reveals no gallop.   No murmur heard. Pulmonary/Chest: Effort normal and breath sounds normal. No respiratory distress. He has no wheezes. He has no rales.  Abdominal: He exhibits no distension and no mass. There is no tenderness.  Large upper diastasis recti  Musculoskeletal: Normal range of motion. He exhibits edema (1+ bipedal). He exhibits no tenderness.  Using walker. Bilateral knee prosthesis. Trace edema BLE  Lymphadenopathy:    He has no cervical adenopathy.  Neurological: He is alert and oriented to person, place, and time. He has normal reflexes. Abnormal reflex: Xray was normal. No cranial nerve deficit. Coordination normal.  Diminished sensation to vibration in the right foot.  Skin: Skin is dry. No rash noted. No erythema. No pallor.  Dry. Itching on dorsum of hands. Left buttock small open area-not infected.   Psychiatric: He has a normal mood and affect. His behavior is normal. Judgment and thought content normal.    Filed Vitals:   10/16/14 1132  BP: 130/58  Pulse: 86  Temp: 97.7 F (36.5 C)  TempSrc: Tympanic  Resp: 20      Labs reviewed: Basic Metabolic Panel:  Recent Labs  16/10/96  NA 135*  K 4.4  BUN 36*  CREATININE 1.3   Liver Function Tests: No results for input(s): AST, ALT, ALKPHOS, BILITOT, PROT, ALBUMIN in the  last 8760 hours. No results for input(s): LIPASE, AMYLASE in the last 8760 hours. No results for input(s): AMMONIA in the last 8760 hours. CBC: No results for input(s): WBC, NEUTROABS, HGB, HCT, MCV, PLT in the last 8760 hours. Lipid Panel: No results for input(s): CHOL, HDL, LDLCALC, TRIG, CHOLHDL, LDLDIRECT in the last 8760 hours.   Past Procedures:  09/02/13 X-ray Right hip: no acute bony abnormalities right hip can be identified.   Assessment/Plan Lumbago 01/06/14 X-ray Lumbar spine: no acute osseous or soft tissue abnormality. Multilevel disc degenerative changes on the lumbar spine likely neural foraminal narrowing at L5-S1 and possible the other levels.  10/16/14 dc Tramadol-not adequate, start Norco 5/325 1/2 q8h, Salonpas OTC, the patient stated he only tolerate bedrest 4-5hours then he sits in the chair which may contribute to the left buttock pressure injury  Decubitus ulcer of sacral region, stage 2 Left buttock, superficial, no s/s of infection, will apply Collagen AG to the affected area q3 days prn, off pressure is must. Obtain CBC, CMP, TSH  Essential hypertension Controlled, takes Lisinopril  bid and Furosemide  daily.   Depression Stable with Sertraline  qhs. Will observe the patient.  Insomnia Not sure if Ambien 5mg  helps or not, the patient stated he only sleeps in be 4-5 hours then get up to his chair due to his back pain, will add Norco to better manage back pain, may be able to wane off Ambien   Cough Chronic, continue Mucinex 600mg  bid and Claritin 10mg  daily.   Constipation Stable, takes MiraLax daily.   GERD (gastroesophageal reflux disease) Stable, takes Omeprazole 20mg  daily.     BPH (benign prostatic hyperplasia) Tamsulosin 0.4mg  and Finasteride 5mg  daily. Better.   Edema Trace in BLE, takes Furosemide 20mg  daily.   CHF (congestive heart failure) Clinically compensated, continue Furosemide 20mg  daily.     Family/ Staff  Communication: observe the patient.   Goals of Care: AL  Labs/tests ordered: CBC, CMP, TSH

## 2014-10-16 NOTE — Assessment & Plan Note (Signed)
Stable, takes Omeprazole 20mg daily.  

## 2014-10-16 NOTE — Assessment & Plan Note (Signed)
Stable,  takes MiraLax daily.  

## 2014-10-16 NOTE — Assessment & Plan Note (Signed)
Clinically compensated, continue Furosemide 20mg  daily.

## 2014-10-16 NOTE — Assessment & Plan Note (Signed)
01/06/14 X-ray Lumbar spine: no acute osseous or soft tissue abnormality. Multilevel disc degenerative changes on the lumbar spine likely neural foraminal narrowing at L5-S1 and possible the other levels.  10/16/14 dc Tramadol-not adequate, start Norco 5/325 1/2 q8h, Salonpas OTC, the patient stated he only tolerate bedrest 4-5hours then he sits in the chair which may contribute to the left buttock pressure injury

## 2014-10-16 NOTE — Assessment & Plan Note (Signed)
Stable with Sertraline 50mg  qhs. Will observe the patient.

## 2014-10-16 NOTE — Assessment & Plan Note (Signed)
Trace in BLE, takes Furosemide 20mg  daily.

## 2014-10-16 NOTE — Assessment & Plan Note (Addendum)
Left buttock, superficial, no s/s of infection, will apply Collagen AG to the affected area q3 days prn, off pressure is must. Obtain CBC, CMP, TSH

## 2014-10-16 NOTE — Assessment & Plan Note (Signed)
Controlled, takes Lisinopril 20mg  bid and Furosemide 20mg  daily.

## 2014-10-19 DIAGNOSIS — D649 Anemia, unspecified: Secondary | ICD-10-CM | POA: Diagnosis not present

## 2014-10-19 DIAGNOSIS — I10 Essential (primary) hypertension: Secondary | ICD-10-CM | POA: Diagnosis not present

## 2014-10-19 LAB — TSH: TSH: 1.1 u[IU]/mL (ref 0.41–5.90)

## 2014-10-19 LAB — CBC AND DIFFERENTIAL
HCT: 37 % — AB (ref 41–53)
HEMOGLOBIN: 12.9 g/dL — AB (ref 13.5–17.5)
Platelets: 191 10*3/uL (ref 150–399)
WBC: 8.1 10^3/mL

## 2014-10-19 LAB — HEPATIC FUNCTION PANEL
ALK PHOS: 56 U/L (ref 25–125)
ALT: 13 U/L (ref 10–40)
AST: 23 U/L (ref 14–40)

## 2014-10-19 LAB — BASIC METABOLIC PANEL
BUN: 32 mg/dL — AB (ref 4–21)
Creatinine: 1.2 mg/dL (ref 0.6–1.3)
GLUCOSE: 113 mg/dL
Potassium: 4.4 mmol/L (ref 3.4–5.3)
SODIUM: 135 mmol/L — AB (ref 137–147)

## 2014-10-20 ENCOUNTER — Other Ambulatory Visit: Payer: Self-pay | Admitting: Nurse Practitioner

## 2014-10-20 DIAGNOSIS — R609 Edema, unspecified: Secondary | ICD-10-CM

## 2014-11-10 ENCOUNTER — Non-Acute Institutional Stay: Payer: Medicare Other | Admitting: Internal Medicine

## 2014-11-10 ENCOUNTER — Encounter: Payer: Self-pay | Admitting: Internal Medicine

## 2014-11-10 VITALS — BP 132/60 | HR 64 | Temp 97.6°F | Wt 185.0 lb

## 2014-11-10 DIAGNOSIS — M5440 Lumbago with sciatica, unspecified side: Secondary | ICD-10-CM

## 2014-11-10 DIAGNOSIS — R05 Cough: Secondary | ICD-10-CM

## 2014-11-10 DIAGNOSIS — M67432 Ganglion, left wrist: Secondary | ICD-10-CM

## 2014-11-10 DIAGNOSIS — M71332 Other bursal cyst, left wrist: Secondary | ICD-10-CM

## 2014-11-10 DIAGNOSIS — L89152 Pressure ulcer of sacral region, stage 2: Secondary | ICD-10-CM

## 2014-11-10 DIAGNOSIS — W19XXXA Unspecified fall, initial encounter: Secondary | ICD-10-CM

## 2014-11-10 DIAGNOSIS — I509 Heart failure, unspecified: Secondary | ICD-10-CM | POA: Diagnosis not present

## 2014-11-10 DIAGNOSIS — I10 Essential (primary) hypertension: Secondary | ICD-10-CM

## 2014-11-10 DIAGNOSIS — M71339 Other bursal cyst, unspecified wrist: Secondary | ICD-10-CM

## 2014-11-10 DIAGNOSIS — R059 Cough, unspecified: Secondary | ICD-10-CM

## 2014-11-10 HISTORY — DX: Other bursal cyst, unspecified wrist: M71.339

## 2014-11-10 MED ORDER — METOPROLOL TARTRATE 25 MG PO TABS: ORAL_TABLET | ORAL | Status: DC

## 2014-11-10 NOTE — Progress Notes (Signed)
Patient ID: Caleb Nash, male   DOB: 1913/12/14, 81 y.o.   MRN: [id number]    [facility]  Nursing Home Room Number: AL4  Place of Service: Clinic (12)     Allergies  Allergen Reactions  . Celebrex [Celecoxib]     Lips swelled  . Sulfur     Lips swelled  . Vioxx [Rofecoxib] Other (See Comments)    unknown    Chief Complaint  Patient presents with  . Medical Management of Chronic Issues    HPI:  Midline low back pain with sciatica, sciatica laterality unspecified: Mild improvement  Decubitus ulcer of sacral region, stage 2: Resolved  Chronic congestive heart failure, unspecified congestive heart failure type: Compensated. Has not been a beta blocker.  Fall, initial encounter: Fell at the side of the bed 2 days ago. No injuries.  Essential hypertension: Controlled  Synovial cyst of wrist, left: Dorsum left wrist. Nontender.  Cough: Persistent and producing occasional small amounts of white phlegm.    Medications: Patient's Medications  New Prescriptions   No medications on file  Previous Medications   ACETAMINOPHEN (TYLENOL) 650 MG CR TABLET    Take 650 mg by mouth. Take one tablet twice daily for pain   AMOXICILLIN (AMOXIL) 500 MG CAPSULE    Take 2,000 mg by mouth daily as needed (prior to surgery).   ASPIRIN 81 MG TABLET    Take 81 mg by mouth daily.   BIMATOPROST (LUMIGAN) 0.01 % SOLN    Place 1 drop into both eyes at bedtime.    CARBOXYMETHYLCELLULOSE (REFRESH PLUS) 0.5 % SOLN    1 drop. Both eyes four times daily as needed for dry eyes   FINASTERIDE (PROSCAR) 5 MG TABLET    Take one tablet by mouth once daily   FLUTICASONE (FLONASE) 50 MCG/ACT NASAL SPRAY    Place 1 spray into both nostrils 2 (two) times daily.   FUROSEMIDE (LASIX) 20 MG TABLET    Take one tablet by mouth once daily. One twice weekly as needed for increased weight 2-3 lbs   GUAIFENESIN (MUCINEX) 600 MG 12 HR TABLET    Take by mouth 2 (two) times daily.   HYDROCODONE-ACETAMINOPHEN (NORCO/VICODIN) 5-325 MG PER TABLET    Take 1 tablet by mouth. One tablet three times daily for pain   KLOR-CON M10 10 MEQ TABLET    Take 1 tablet twice weekly as needed with Lasix   LINIMENTS (SALONPAS) PADS    Apply topically. Apply fresh patch daily to lower back   LISINOPRIL (PRINIVIL,ZESTRIL) 20 MG TABLET    Take one tablet twice daily for blood pressure   LORATADINE (CLARITIN) 10 MG TABLET    Take 10 mg by mouth daily.   MULTIPLE VITAMINS-MINERALS (ICAPS PO)    Take by mouth. Take one daily   NAPROXEN SODIUM (ANAPROX) 220 MG TABLET    Take 220 mg by mouth 2 (two) times daily with a meal.   NITROGLYCERIN (NITROSTAT) 0.4 MG SL TABLET    Place 0.4 mg under the tongue every 5 (five) minutes as needed. For chest pain   OMEPRAZOLE (PRILOSEC) 20 MG CAPSULE    Take 20 mg by mouth daily.   POLYETHYL GLYCOL-PROPYL GLYCOL 0.4-0.3 % SOLN    Apply to eye. One drop both eyes twice daily   POLYETHYLENE GLYCOL (MIRALAX / GLYCOLAX) PACKET    Take 17 g by mouth daily. Mix with 8 ounces of water or juice and take by mouth every other day for  constipation.   SERTRALINE (ZOLOFT) 50 MG TABLET    One daily to help depression   TAMSULOSIN HCL (FLOMAX) 0.4 MG CAPS    Take 0.4 mg by mouth daily.   VOLTAREN 1 % GEL    Apply thin layer to joints twice daily   ZOLPIDEM (AMBIEN) 5 MG TABLET    Take 1/2 tablet by mouth at bedtime for rest  Modified Medications   No medications on file  Discontinued Medications   TRAMADOL (ULTRAM) 50 MG TABLET    Take two tablets by mouth every morning and every evening for pain     Review of Systems  Constitutional: Negative.  Negative for activity change, appetite change, fatigue and unexpected weight change.  HENT: Negative.  Negative for hearing loss, sinus pressure and sore throat.   Eyes:       Hx macular degeneration. Receiving injections every 6-8 weeks.  Respiratory: Positive for cough. Negative for apnea, chest tightness, shortness of breath,  wheezing and stridor.   Cardiovascular: Positive for leg swelling. Negative for chest pain and palpitations.  Gastrointestinal: Negative for nausea, vomiting, abdominal pain and abdominal distention.       Flatulence  Endocrine: Negative.   Genitourinary: Negative for urgency, frequency and decreased urine volume.       BPH.  Musculoskeletal: Positive for back pain and gait problem (uunstable. uses walker). Negative for neck pain and neck stiffness.       Reviewed x-ray reports from 2009. Patient has foraminal stenoses and possibly spinal stenosis now. There is multiple intervertebral degenerative disc disease. He has had steroid injections at [facility], Dr. Naaman PlummerFred Newton.  Skin: Negative.        Dry. Itching on dorsum of hands. Left buttock small open area-not infected.   Allergic/Immunologic: Negative.   Neurological: Negative.   Hematological: Negative.   Psychiatric/Behavioral: Positive for sleep disturbance (iinsomnia. Ambien helps.).    Filed Vitals:   11/10/14 0933  BP: 132/60  Pulse: 64  Temp: 97.6 F (36.4 C)  TempSrc: Oral  Weight: 185 lb (83.915 kg)  SpO2: 96%   Body mass index is 28.14 kg/(m^2).  Physical Exam  Constitutional: He is oriented to person, place, and time. He appears well-nourished. No distress.  Frail  HENT:  Head: Normocephalic and atraumatic.  Right Ear: External ear normal.  Left Ear: External ear normal.  Nose: Nose normal.  Mouth/Throat: Oropharynx is clear and moist.  Mild decrease in hearing  Eyes: Conjunctivae and EOM are normal. Pupils are equal, round, and reactive to light.  Neck: No JVD present. No tracheal deviation present. No thyromegaly present.  Cardiovascular: Normal rate, regular rhythm and intact distal pulses.  Exam reveals friction rub. Exam reveals no gallop.   No murmur heard. Pulmonary/Chest: Effort normal. No respiratory distress. He has no wheezes. He has rales (Right chest).  Abdominal: He exhibits  no distension and no mass. There is no tenderness.  Large upper diastasis recti  Musculoskeletal: Normal range of motion. He exhibits edema (1+ bipedal). He exhibits no tenderness.  Using walker. Bilateral knee prosthesis.  Lymphadenopathy:    He has no cervical adenopathy.  Neurological: He is alert and oriented to person, place, and time. He has normal reflexes. Abnormal reflex: Xray was normal. No cranial nerve deficit. Coordination normal.  Diminished sensation to vibration in the right foot.  Skin: Skin is dry. No rash noted. No erythema. No pallor.  Scars at sternum from CABG, right scapula from cyst removal, and both knees from  TKR. Multiple SK. Sacral decubitus, stage II.Marland Kitchen   Psychiatric: He has a normal mood and affect. His behavior is normal. Judgment and thought content normal.     Labs reviewed: Lab on 10/20/2014  Component Date Value Ref Range Status  . Hemoglobin 10/19/2014 12.9* 13.5 - 17.5 g/dL Final  . HCT 40/98/1191 37* 41 - 53 % Final  . Platelets 10/19/2014 191  150 - 399 K/L Final  . WBC 10/19/2014 8.1   Final  . Glucose 10/19/2014 113   Final  . BUN 10/19/2014 32* 4 - 21 mg/dL Final  . Creatinine 47/82/9562 1.2  0.6 - 1.3 mg/dL Final  . Potassium 13/11/6576 4.4  3.4 - 5.3 mmol/L Final  . Sodium 10/19/2014 135* 137 - 147 mmol/L Final  . Alkaline Phosphatase 10/19/2014 56  25 - 125 U/L Final  . ALT 10/19/2014 13  10 - 40 U/L Final  . AST 10/19/2014 23  14 - 40 U/L Final  . TSH 10/19/2014 1.10  0.41 - 5.90 uIU/mL Final  Clinical Support on 10/01/2014  Component Date Value Ref Range Status  . Date Time Interrogation Session 10/01/2014 [id number]   Preliminary  . Pulse Generator Manufacturer 10/01/2014 MERM   Preliminary  . Pulse Gen Model 10/01/2014 [id number] Jana Half   Preliminary  . Pulse Gen Serial Number 10/01/2014 [id number]   Preliminary  . Implantable Pulse Generator Type 10/01/2014 Implantable Pulse Generator   Preliminary  . Implantable Pulse  Generator Implan* 10/01/2014 [id number]   Preliminary  . Lead Channel Setting Sensing Sensi* 10/01/2014 5.6   Preliminary  . Lead Channel Setting Pacing Amplit* 10/01/2014 2   Preliminary  . Lead Channel Setting Pacing Pulse * 10/01/2014 0.4   Preliminary  . Lead Channel Setting Pacing Amplit* 10/01/2014 2.5   Preliminary  . Lead Channel Impedance Value 10/01/2014 453   Preliminary  . Lead Channel Pacing Threshold Ampl* 10/01/2014 0.5   Preliminary  . Lead Channel Pacing Threshold Puls* 10/01/2014 0.4   Preliminary  . Lead Channel Impedance Value 10/01/2014 676   Preliminary  . Lead Channel Pacing Threshold Ampl* 10/01/2014 0.625   Preliminary  . Lead Channel Pacing Threshold Puls* 10/01/2014 0.4   Preliminary  . Battery Status 10/01/2014 Unknown   Preliminary  . Battery Remaining Longevity 10/01/2014 97   Preliminary  . Battery Voltage 10/01/2014 2.79   Preliminary  . Battery Impedance 10/01/2014 205   Preliminary  . Huston Foley Statistic AP VP Percent 10/01/2014 96   Preliminary  . Huston Foley Statistic AS VP Percent 10/01/2014 4   Preliminary  . Huston Foley Statistic AP VS Percent 10/01/2014 0   Preliminary  . Huston Foley Statistic AS VS Percent 10/01/2014 0   Preliminary  . Eval Rhythm 10/01/2014 ApVp   Preliminary     Assessment/Plan  1. Midline low back pain with sciatica, sciatica laterality unspecified Continue current medications  2. Decubitus ulcer of sacral region, stage 2 Intended to observe pressure relieving positions. Walk regularly.  3. Chronic congestive heart failure, unspecified congestive heart failure type Compensated - metoprolol tartrate (LOPRESSOR) 25 MG tablet; One twice daily to control blood pressure  Dispense: 180 tablet; Refill: 3  4. Fall, initial encounter On 11/08/2014. Non-injurious.  5. Essential hypertension Controlled  6. Synovial cyst of wrist, left Advised simply monitor this. No referral at this point.  7. Cough Continue cough medicines and  Mucinex.

## 2014-11-17 ENCOUNTER — Encounter: Payer: Self-pay | Admitting: Internal Medicine

## 2014-11-17 ENCOUNTER — Non-Acute Institutional Stay: Payer: Medicare Other | Admitting: Internal Medicine

## 2014-11-17 ENCOUNTER — Other Ambulatory Visit: Payer: Self-pay | Admitting: Internal Medicine

## 2014-11-17 VITALS — BP 136/62 | HR 64 | Temp 97.5°F | Wt 186.0 lb

## 2014-11-17 DIAGNOSIS — R609 Edema, unspecified: Secondary | ICD-10-CM | POA: Diagnosis not present

## 2014-11-17 DIAGNOSIS — M5440 Lumbago with sciatica, unspecified side: Secondary | ICD-10-CM

## 2014-11-17 DIAGNOSIS — R52 Pain, unspecified: Secondary | ICD-10-CM | POA: Diagnosis not present

## 2014-11-17 DIAGNOSIS — M545 Low back pain, unspecified: Secondary | ICD-10-CM

## 2014-11-17 DIAGNOSIS — L89152 Pressure ulcer of sacral region, stage 2: Secondary | ICD-10-CM | POA: Diagnosis not present

## 2014-11-17 MED ORDER — METHADONE HCL 5 MG PO TABS: ORAL_TABLET | ORAL | Status: DC

## 2014-11-17 NOTE — Progress Notes (Signed)
Patient ID: Caleb Nash, male   DOB: 07-24-1913, 81 y.o.   MRN: [id number]    [facility]     Place of Service: Clinic (12)     Allergies  Allergen Reactions  . Celebrex [Celecoxib]     Lips swelled  . Sulfur     Lips swelled  . Vioxx [Rofecoxib] Other (See Comments)    unknown    Chief Complaint  Patient presents with  . Acute Visit    back pain, medication not helping, "can't lay, set, or sleep"    HPI:  Midline low back pain with sciatica, sciatica laterality unspecified: Continues with discomfort in the midline lower back with some shading to the left side. Difficult to stand, walk, or sit very long due to the discomfort. Patient says "I cannot go on like this". Has had previous injections of the lumbar area by Dr. Alvester Morin.  Decubitus ulcer of sacral region, stage 2: Continues to have cream applied to the sacral decubitus of the left buttock medially. Area is healing. There is no discomfort.  Edema: Persistent problem in about 1-2+ in both lower legs.    Medications: Patient's Medications  New Prescriptions   No medications on file  Previous Medications   ACETAMINOPHEN (TYLENOL) 650 MG CR TABLET    Take 650 mg by mouth. Take one tablet twice daily for pain   AMOXICILLIN (AMOXIL) 500 MG CAPSULE    Take 2,000 mg by mouth daily as needed (prior to surgery).   ASPIRIN 81 MG TABLET    Take 81 mg by mouth daily.   BIMATOPROST (LUMIGAN) 0.01 % SOLN    Place 1 drop into both eyes at bedtime.    CARBOXYMETHYLCELLULOSE (REFRESH PLUS) 0.5 % SOLN    1 drop. Both eyes four times daily as needed for dry eyes   FINASTERIDE (PROSCAR) 5 MG TABLET    Take one tablet by mouth once daily   FLUTICASONE (FLONASE) 50 MCG/ACT NASAL SPRAY    Place 1 spray into both nostrils 2 (two) times daily.   FUROSEMIDE (LASIX) 20 MG TABLET    Take one tablet by mouth once daily. One twice weekly as needed for increased weight 2-3 lbs   GUAIFENESIN (MUCINEX) 600 MG 12 HR TABLET    Take by  mouth 2 (two) times daily.   HYDROCODONE-ACETAMINOPHEN (NORCO/VICODIN) 5-325 MG PER TABLET    Take 1 tablet by mouth. One tablet three times daily for pain. 1/2 tablet every 8 hours   KLOR-CON M10 10 MEQ TABLET    Take 1 tablet twice weekly as needed with Lasix   LINIMENTS (SALONPAS) PADS    Apply topically. Apply fresh patch daily to lower back   LISINOPRIL (PRINIVIL,ZESTRIL) 20 MG TABLET    Take one tablet twice daily for blood pressure   LORATADINE (CLARITIN) 10 MG TABLET    Take 10 mg by mouth daily.   METOPROLOL TARTRATE (LOPRESSOR) 25 MG TABLET    One twice daily to control blood pressure   MULTIPLE VITAMINS-MINERALS (ICAPS PO)    Take by mouth. Take one daily   NAPROXEN SODIUM (ANAPROX) 220 MG TABLET    Take 220 mg by mouth 2 (two) times daily with a meal.   NITROGLYCERIN (NITROSTAT) 0.4 MG SL TABLET    Place 0.4 mg under the tongue every 5 (five) minutes as needed. For chest pain   OMEPRAZOLE (PRILOSEC) 20 MG CAPSULE    Take 20 mg by mouth daily.   POLYETHYL GLYCOL-PROPYL GLYCOL  0.4-0.3 % SOLN    Apply to eye. One drop both eyes twice daily   POLYETHYLENE GLYCOL (MIRALAX / GLYCOLAX) PACKET    Take 17 g by mouth daily. Mix with 8 ounces of water or juice and take by mouth every other day for constipation.   SERTRALINE (ZOLOFT) 50 MG TABLET    One daily to help depression   TAMSULOSIN HCL (FLOMAX) 0.4 MG CAPS    Take 0.4 mg by mouth daily.   VOLTAREN 1 % GEL    Apply thin layer to joints twice daily, apply to lower back twice daily   ZOLPIDEM (AMBIEN) 5 MG TABLET    Take 1/2 tablet by mouth at bedtime for rest  Modified Medications   No medications on file  Discontinued Medications   No medications on file     Review of Systems  Constitutional: Negative.  Negative for activity change, appetite change, fatigue and unexpected weight change.  HENT: Negative.  Negative for hearing loss, sinus pressure and sore throat.   Eyes:       Hx macular degeneration. Receiving injections every 6-8  weeks.  Respiratory: Positive for cough. Negative for apnea, chest tightness, shortness of breath, wheezing and stridor.   Cardiovascular: Positive for leg swelling. Negative for chest pain and palpitations.  Gastrointestinal: Negative for nausea, vomiting, abdominal pain and abdominal distention.       Flatulence  Endocrine: Negative.   Genitourinary: Negative for urgency, frequency and decreased urine volume.       BPH.  Musculoskeletal: Positive for back pain and gait problem (uunstable. uses walker). Negative for neck pain and neck stiffness.       Reviewed x-ray reports from 2009. Patient has foraminal stenoses and possibly spinal stenosis now. There is multiple intervertebral degenerative disc disease. He has had steroid injections at [facility], Dr. Naaman Plummer. Some pain radiates down the right leg. It is painful to walk, sit, or stand. He can be comfortable laying down in bed.  Skin: Negative.        Dry. Itching on dorsum of hands. Left buttock small open area-not infected.   Allergic/Immunologic: Negative.   Neurological: Negative.   Hematological: Negative.   Psychiatric/Behavioral: Positive for sleep disturbance (iinsomnia. Ambien helps.).    Filed Vitals:   11/17/14 0917  BP: 136/62  Pulse: 64  Temp: 97.5 F (36.4 C)  TempSrc: Oral  Weight: 186 lb (84.369 kg)  SpO2: 99%   Body mass index is 28.29 kg/(m^2).  Physical Exam  Constitutional: He is oriented to person, place, and time. He appears well-nourished. No distress.  Frail  HENT:  Head: Normocephalic and atraumatic.  Right Ear: External ear normal.  Left Ear: External ear normal.  Nose: Nose normal.  Mouth/Throat: Oropharynx is clear and moist.  Mild decrease in hearing  Eyes: Conjunctivae and EOM are normal. Pupils are equal, round, and reactive to light.  Neck: No JVD present. No tracheal deviation present. No thyromegaly present.  Cardiovascular: Normal rate, regular rhythm and  intact distal pulses.  Exam reveals friction rub. Exam reveals no gallop.   No murmur heard. Pulmonary/Chest: Effort normal. No respiratory distress. He has no wheezes. He has rales (Right chest).  Abdominal: He exhibits no distension and no mass. There is no tenderness.  Large upper diastasis recti  Musculoskeletal: Normal range of motion. He exhibits edema (1+ bipedal). He exhibits no tenderness.  Using walker. Bilateral knee prosthesis. Tender lumbar spine and shading towards the left side of the small  of the back.  Lymphadenopathy:    He has no cervical adenopathy.  Neurological: He is alert and oriented to person, place, and time. He has normal reflexes. Abnormal reflex: Xray was normal. No cranial nerve deficit. Coordination normal.  Diminished sensation to vibration in the right foot.  Skin: Skin is dry. No rash noted. No erythema. No pallor.  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 Multiple SK. Sacral decubitus, stage II.Marland Kitchen   Psychiatric: He has a normal mood and affect. His behavior is normal. Judgment and thought content normal.     Labs reviewed: Lab on 10/20/2014  Component Date Value Ref Range Status  . Hemoglobin 10/19/2014 12.9* 13.5 - 17.5 g/dL Final  . HCT 69/62/9528 37* 41 - 53 % Final  . Platelets 10/19/2014 191  150 - 399 K/L Final  . WBC 10/19/2014 8.1   Final  . Glucose 10/19/2014 113   Final  . BUN 10/19/2014 32* 4 - 21 mg/dL Final  . Creatinine 41/32/4401 1.2  0.6 - 1.3 mg/dL Final  . Potassium 02/72/5366 4.4  3.4 - 5.3 mmol/L Final  . Sodium 10/19/2014 135* 137 - 147 mmol/L Final  . Alkaline Phosphatase 10/19/2014 56  25 - 125 U/L Final  . ALT 10/19/2014 13  10 - 40 U/L Final  . AST 10/19/2014 23  14 - 40 U/L Final  . TSH 10/19/2014 1.10  0.41 - 5.90 uIU/mL Final  Clinical Support on 10/01/2014  Component Date Value Ref Range Status  . Date Time Interrogation Session 10/01/2014 [id number]   Preliminary    . Pulse Generator Manufacturer 10/01/2014 MERM   Preliminary  . Pulse Gen Model 10/01/2014 [id number] Jana Half   Preliminary  . Pulse Gen Serial Number 10/01/2014 [id number]   Preliminary  . Implantable Pulse Generator Type 10/01/2014 Implantable Pulse Generator   Preliminary  . Implantable Pulse Generator Implan* 10/01/2014 [id number]   Preliminary  . Lead Channel Setting Sensing Sensi* 10/01/2014 5.6   Preliminary  . Lead Channel Setting Pacing Amplit* 10/01/2014 2   Preliminary  . Lead Channel Setting Pacing Pulse * 10/01/2014 0.4   Preliminary  . Lead Channel Setting Pacing Amplit* 10/01/2014 2.5   Preliminary  . Lead Channel Impedance Value 10/01/2014 453   Preliminary  . Lead Channel Pacing Threshold Ampl* 10/01/2014 0.5   Preliminary  . Lead Channel Pacing Threshold Puls* 10/01/2014 0.4   Preliminary  . Lead Channel Impedance Value 10/01/2014 676   Preliminary  . Lead Channel Pacing Threshold Ampl* 10/01/2014 0.625   Preliminary  . Lead Channel Pacing Threshold Puls* 10/01/2014 0.4   Preliminary  . Battery Status 10/01/2014 Unknown   Preliminary  . Battery Remaining Longevity 10/01/2014 97   Preliminary  . Battery Voltage 10/01/2014 2.79   Preliminary  . Battery Impedance 10/01/2014 205   Preliminary  . Huston Foley Statistic AP VP Percent 10/01/2014 96   Preliminary  . Huston Foley Statistic AS VP Percent 10/01/2014 4   Preliminary  . Huston Foley Statistic AP VS Percent 10/01/2014 0   Preliminary  . Huston Foley Statistic AS VS Percent 10/01/2014 0   Preliminary  . Eval Rhythm 10/01/2014 ApVp   Preliminary     Assessment/Plan  1. Midline low back pain with sciatica, sciatica laterality unspecified -Schedule CT scan of the lumbar spine through the nursing home -Schedule ultrasound of the kidneys through the nursing home - methadone (DOLOPHINE) 5 MG tablet; Take 1 tablet every 12 hours to relieve pain  Dispense: 60  tablet; Refill: 0  2. Decubitus ulcer of sacral region, stage 2 Healed  3.  Edema Unchanged

## 2014-11-19 ENCOUNTER — Ambulatory Visit
Admission: RE | Admit: 2014-11-19 | Discharge: 2014-11-19 | Disposition: A | Discharge status: Home / Self Care | Payer: Medicare Other | Source: Ambulatory Visit | Attending: Internal Medicine | Admitting: Internal Medicine

## 2014-11-19 DIAGNOSIS — M5117 Intervertebral disc disorders with radiculopathy, lumbosacral region: Secondary | ICD-10-CM | POA: Diagnosis not present

## 2014-11-19 DIAGNOSIS — M545 Low back pain, unspecified: Secondary | ICD-10-CM

## 2014-11-19 DIAGNOSIS — M4806 Spinal stenosis, lumbar region: Secondary | ICD-10-CM | POA: Diagnosis not present

## 2014-11-24 ENCOUNTER — Other Ambulatory Visit: Payer: Self-pay | Admitting: Internal Medicine

## 2014-11-26 ENCOUNTER — Other Ambulatory Visit: Payer: Self-pay | Admitting: *Deleted

## 2014-11-26 MED ORDER — HYDROCODONE-ACETAMINOPHEN 5-325 MG PO TABS: ORAL_TABLET | ORAL | Status: DC

## 2014-11-26 NOTE — Telephone Encounter (Signed)
Received fax from [facility] requested to D/C scheduled Norco 5/325 po tid and start Norco 5/325 po every 8 hours as needed for pain. Per Dr. Reed--Send new Rx. She signed order and faxed back to [facility]

## 2014-12-09 ENCOUNTER — Other Ambulatory Visit: Payer: Self-pay | Admitting: *Deleted

## 2014-12-09 MED ORDER — FUROSEMIDE 20 MG PO TABS: ORAL_TABLET | ORAL | Status: DC

## 2014-12-15 ENCOUNTER — Non-Acute Institutional Stay: Payer: Medicare Other | Admitting: Internal Medicine

## 2014-12-15 ENCOUNTER — Encounter: Payer: Self-pay | Admitting: Internal Medicine

## 2014-12-15 VITALS — BP 138/62 | HR 60 | Temp 97.9°F | Wt 184.0 lb

## 2014-12-15 DIAGNOSIS — L89152 Pressure ulcer of sacral region, stage 2: Secondary | ICD-10-CM

## 2014-12-15 DIAGNOSIS — M4806 Spinal stenosis, lumbar region: Secondary | ICD-10-CM

## 2014-12-15 DIAGNOSIS — M5440 Lumbago with sciatica, unspecified side: Secondary | ICD-10-CM | POA: Diagnosis not present

## 2014-12-15 DIAGNOSIS — I10 Essential (primary) hypertension: Secondary | ICD-10-CM | POA: Diagnosis not present

## 2014-12-15 DIAGNOSIS — M48061 Spinal stenosis, lumbar region without neurogenic claudication: Secondary | ICD-10-CM

## 2014-12-15 NOTE — Progress Notes (Signed)
Patient ID: Caleb Nash, male   DOB: [birth year], 81 y.o.   MRN: [id number]    [facility]  Nursing Home Room Number: AL 04  Place of Service: Clinic (12)     Allergies  Allergen Reactions  . Celebrex [Celecoxib]     Lips swelled  . Sulfur     Lips swelled  . Vioxx [Rofecoxib] Other (See Comments)    unknown    Chief Complaint  Patient presents with  . Medical Management of Chronic Issues    chronic back pain. "it's improve"    HPI:  Midline low back pain with sciatica, sciatica laterality unspecified: Improved since put on methadone. Ultrasound of the kidneys was normal except for a benign appearing cyst in the right kidney. CT of the lumbar spine showed a significant amount of osteoarthritic changes exacerbated by scoliosis. There was no fracture. There was multilevel facet hypertrophy bilaterally and mild spinal stenosis at several levels, most notably at L2-3. Impression on exiting nerve roots at L5-S1 bilaterally due to disc extrusion impressing on the nerve roots at the exit foramen a bilaterally. Tolerating methadone without excessive drowsiness.  Spinal stenosis of lumbar region: See notes above. Contributes to lower back discomfort which has improved.  Essential hypertension- controlled  Decubitus ulcer of sacral region, stage 2- improving    Medications: Patient's Medications  New Prescriptions   No medications on file  Previous Medications   ACETAMINOPHEN (TYLENOL) 650 MG CR TABLET    Take 650 mg by mouth. Take one tablet twice daily for pain   AMOXICILLIN (AMOXIL) 500 MG CAPSULE    Take 2,000 mg by mouth daily as needed (prior to surgery).   ASPIRIN 81 MG TABLET    Take 81 mg by mouth daily.   BIMATOPROST (LUMIGAN) 0.01 % SOLN    Place 1 drop into both eyes at bedtime.    CARBOXYMETHYLCELLULOSE (REFRESH PLUS) 0.5 % SOLN    1 drop. Both eyes four times daily as needed for dry eyes   FINASTERIDE (PROSCAR) 5 MG TABLET    Take one tablet by mouth  once daily   FLUTICASONE (FLONASE) 50 MCG/ACT NASAL SPRAY    Place 1 spray into both nostrils 2 (two) times daily.   FUROSEMIDE (LASIX) 20 MG TABLET    Take one tablet by mouth once daily. One twice weekly as needed for increased weight 2-3 lbs   GUAIFENESIN (MUCINEX) 600 MG 12 HR TABLET    Take by mouth 2 (two) times daily.   HYDROCODONE-ACETAMINOPHEN (NORCO/VICODIN) 5-325 MG PER TABLET    Take one tablet by mouth every 8 hours as needed for pain   KLOR-CON M10 10 MEQ TABLET    Take 1 tablet twice weekly as needed with Lasix   LINIMENTS (SALONPAS) PADS    Apply topically. Apply fresh patch daily to lower back   LISINOPRIL (PRINIVIL,ZESTRIL) 20 MG TABLET    Take one tablet twice daily for blood pressure   LORATADINE (CLARITIN) 10 MG TABLET    Take 10 mg by mouth daily.   METHADONE (DOLOPHINE) 5 MG TABLET    Take 1 tablet every 12 hours to relieve pain   METOPROLOL TARTRATE (LOPRESSOR) 25 MG TABLET    One twice daily to control blood pressure   MULTIPLE VITAMINS-MINERALS (ICAPS PO)    Take by mouth. Take one daily   NITROGLYCERIN (NITROSTAT) 0.4 MG SL TABLET    Place 0.4 mg under the tongue every 5 (five) minutes as needed. For chest  pain   OMEPRAZOLE (PRILOSEC) 20 MG CAPSULE    Take 20 mg by mouth daily.   POLYETHYL GLYCOL-PROPYL GLYCOL 0.4-0.3 % SOLN    Apply to eye. One drop both eyes twice daily   POLYETHYLENE GLYCOL (MIRALAX / GLYCOLAX) PACKET    Take 17 g by mouth daily. Mix with 8 ounces of water or juice and take by mouth every other day for constipation.   SERTRALINE (ZOLOFT) 50 MG TABLET    One daily to help depression   TAMSULOSIN HCL (FLOMAX) 0.4 MG CAPS    Take 0.4 mg by mouth daily.   VOLTAREN 1 % GEL    Apply thin layer to joints twice daily, apply to lower back twice daily   ZOLPIDEM (AMBIEN) 5 MG TABLET    Take 5 mg by mouth. Take one tablet at bedtime for rest  Modified Medications   No medications on file  Discontinued Medications   NAPROXEN SODIUM (ANAPROX) 220 MG TABLET     Take 220 mg by mouth 2 (two) times daily with a meal.   ZOLPIDEM (AMBIEN) 5 MG TABLET    Take 1/2 tablet by mouth at bedtime for rest     Review of Systems  Constitutional: Negative.  Negative for activity change, appetite change, fatigue and unexpected weight change.  HENT: Negative.  Negative for hearing loss, sinus pressure and sore throat.   Eyes:       Hx macular degeneration. Receiving injections every 6-8 weeks.  Respiratory: Positive for cough. Negative for apnea, chest tightness, shortness of breath, wheezing and stridor.   Cardiovascular: Positive for leg swelling. Negative for chest pain and palpitations.  Gastrointestinal: Negative for nausea, vomiting, abdominal pain and abdominal distention.       Flatulence  Endocrine: Negative.   Genitourinary: Negative for urgency, frequency and decreased urine volume.       BPH.  Musculoskeletal: Positive for back pain and gait problem (uunstable. uses walker). Negative for neck pain and neck stiffness.       Reviewed x-ray reports from 2009 and recent CT of the lumbar spine.. Patient has foraminal stenoses and  spinal stenosis now. There is multiple intervertebral degenerative disc disease. He has had steroid injections at [facility], Dr. Naaman Plummer. Some pain radiates down the right leg. He is now comfortable moving around using his 4 wheel walker. He is able to get in bed and lay down comfortably overnight.  Skin: Negative.        Dry. Itching on dorsum of hands. Left buttock small open area-not infected.   Allergic/Immunologic: Negative.   Neurological: Negative.   Hematological: Negative.   Psychiatric/Behavioral: Positive for sleep disturbance (iinsomnia. Ambien helps.).    Filed Vitals:   12/15/14 0849  BP: 138/62  Pulse: 60  Temp: 97.9 F (36.6 C)  TempSrc: Oral  Weight: 184 lb (83.462 kg)  SpO2: 97%   Body mass index is 27.98 kg/(m^2).  Physical Exam  Constitutional: He is oriented to person,  place, and time. He appears well-nourished. No distress.  Frail  HENT:  Head: Normocephalic and atraumatic.  Right Ear: External ear normal.  Left Ear: External ear normal.  Nose: Nose normal.  Mouth/Throat: Oropharynx is clear and moist.  Mild decrease in hearing  Eyes: Conjunctivae and EOM are normal. Pupils are equal, round, and reactive to light.  Neck: No JVD present. No tracheal deviation present. No thyromegaly present.  Cardiovascular: Normal rate, regular rhythm and intact distal pulses.  Exam reveals friction rub.  Exam reveals no gallop.   No murmur heard. Pulmonary/Chest: Effort normal. No respiratory distress. He has no wheezes. He has rales (Right chest).  Abdominal: He exhibits no distension and no mass. There is no tenderness.  Large upper diastasis recti  Musculoskeletal: Normal range of motion. He exhibits edema (1+ bipedal). He exhibits no tenderness.  Using walker. Bilateral knee prosthesis. Tender lumbar spine and shading towards the left side of the small of the back.  Lymphadenopathy:    He has no cervical adenopathy.  Neurological: He is alert and oriented to person, place, and time. No cranial nerve deficit. Coordination normal.  Diminished sensation to vibration in the right foot.  Skin: Skin is dry. No rash noted. No erythema. No pallor.  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 Multiple SK. Sacral decubitus, stage II.  Psychiatric: He has a normal mood and affect. His behavior is normal. Judgment and thought content normal.     Labs reviewed: Lab on 10/20/2014  Component Date Value Ref Range Status  . Hemoglobin 10/19/2014 12.9* 13.5 - 17.5 g/dL Final  . HCT 16/01/9603 37* 41 - 53 % Final  . Platelets 10/19/2014 191  150 - 399 K/L Final  . WBC 10/19/2014 8.1   Final  . Glucose 10/19/2014 113   Final  . BUN 10/19/2014 32* 4 - 21 mg/dL Final  . Creatinine 54/12/8117 1.2  0.6 - 1.3 mg/dL Final  . Potassium  14/78/2956 4.4  3.4 - 5.3 mmol/L Final  . Sodium 10/19/2014 135* 137 - 147 mmol/L Final  . Alkaline Phosphatase 10/19/2014 56  25 - 125 U/L Final  . ALT 10/19/2014 13  10 - 40 U/L Final  . AST 10/19/2014 23  14 - 40 U/L Final  . TSH 10/19/2014 1.10  0.41 - 5.90 uIU/mL Final  Clinical Support on 10/01/2014  Component Date Value Ref Range Status  . Date Time Interrogation Session 10/01/2014 [id number]   Preliminary  . Pulse Generator Manufacturer 10/01/2014 MERM   Preliminary  . Pulse Gen Model 10/01/2014 [id number] Jana Half   Preliminary  . Pulse Gen Serial Number 10/01/2014 [id number]   Preliminary  . Implantable Pulse Generator Type 10/01/2014 Implantable Pulse Generator   Preliminary  . Implantable Pulse Generator Implan* 10/01/2014 [id number]   Preliminary  . Lead Channel Setting Sensing Sensi* 10/01/2014 5.6   Preliminary  . Lead Channel Setting Pacing Amplit* 10/01/2014 2   Preliminary  . Lead Channel Setting Pacing Pulse * 10/01/2014 0.4   Preliminary  . Lead Channel Setting Pacing Amplit* 10/01/2014 2.5   Preliminary  . Lead Channel Impedance Value 10/01/2014 453   Preliminary  . Lead Channel Pacing Threshold Ampl* 10/01/2014 0.5   Preliminary  . Lead Channel Pacing Threshold Puls* 10/01/2014 0.4   Preliminary  . Lead Channel Impedance Value 10/01/2014 676   Preliminary  . Lead Channel Pacing Threshold Ampl* 10/01/2014 0.625   Preliminary  . Lead Channel Pacing Threshold Puls* 10/01/2014 0.4   Preliminary  . Battery Status 10/01/2014 Unknown   Preliminary  . Battery Remaining Longevity 10/01/2014 97   Preliminary  . Battery Voltage 10/01/2014 2.79   Preliminary  . Battery Impedance 10/01/2014 205   Preliminary  . Huston Foley Statistic AP VP Percent 10/01/2014 96   Preliminary  . Huston Foley Statistic AS VP Percent 10/01/2014 4   Preliminary  . Huston Foley Statistic AP VS Percent 10/01/2014 0   Preliminary  . Huston Foley Statistic AS VS Percent 10/01/2014 0  Preliminary  . Eval Rhythm  10/01/2014 ApVp   Preliminary     Assessment/Plan  1. Midline low back pain with sciatica, sciatica laterality unspecified Continue current medications  2. Spinal stenosis of lumbar region Root cause of his lower back discomfort. With improvement, no surgical intervention seems necessary.  3. Essential hypertension Controlled  4. Decubitus ulcer of sacral region, stage 2 Improved

## 2014-12-21 ENCOUNTER — Ambulatory Visit (INDEPENDENT_AMBULATORY_CARE_PROVIDER_SITE_OTHER): Payer: Medicare Other | Admitting: Internal Medicine

## 2014-12-21 ENCOUNTER — Encounter: Payer: Self-pay | Admitting: Internal Medicine

## 2014-12-21 VITALS — BP 120/60 | HR 75 | Ht 68.0 in | Wt 184.4 lb

## 2014-12-21 DIAGNOSIS — Z950 Presence of cardiac pacemaker: Secondary | ICD-10-CM | POA: Diagnosis not present

## 2014-12-21 DIAGNOSIS — I495 Sick sinus syndrome: Secondary | ICD-10-CM | POA: Diagnosis not present

## 2014-12-21 LAB — CUP PACEART INCLINIC DEVICE CHECK
Battery Impedance: 205 Ohm
Battery Remaining Longevity: 98 mo
Brady Statistic AP VP Percent: 96 %
Brady Statistic AP VS Percent: 0 %
Brady Statistic AS VP Percent: 4 %
Brady Statistic AS VS Percent: 0 %
Lead Channel Impedance Value: 453 Ohm
Lead Channel Impedance Value: 711 Ohm
Lead Channel Pacing Threshold Amplitude: 0.5 V
Lead Channel Pacing Threshold Pulse Width: 0.4 ms
Lead Channel Setting Pacing Amplitude: 2 V
Lead Channel Setting Pacing Amplitude: 2.5 V
Lead Channel Setting Pacing Pulse Width: 0.4 ms
MDC IDC MSMT BATTERY VOLTAGE: 2.79 V
MDC IDC MSMT LEADCHNL RA PACING THRESHOLD AMPLITUDE: 0.5 V
MDC IDC MSMT LEADCHNL RV PACING THRESHOLD PULSEWIDTH: 0.4 ms
MDC IDC SESS DTM: 20160829184114
MDC IDC SET LEADCHNL RV SENSING SENSITIVITY: 4 mV

## 2014-12-21 NOTE — Progress Notes (Signed)
PCP:  Dr Chilton Si Primary Cardiologist:  Dr Suella Broad is a 81 y.o. male with a h/o symptomatic bradycardia sp PPM (MDT) who presents today for follow-up in the Electrophysiology device clinic.  The patient reports doing very well.  He has an occasional cough when he eats.  He plays cards but is not very active.  He lives at [facility].   Today, he  denies symptoms of palpitations, chest pain, shortness of breath, orthopnea, PND, lower extremity edema, dizziness, presyncope, syncope, or neurologic sequela.  The patientis tolerating medications without difficulties and is otherwise without complaint today.   Past Medical History  Diagnosis Date  . Osteoarthritis   . HTN (hypertension)   . BPH (benign prostatic hyperplasia)   . Sick sinus syndrome 10/14/2003; 10/29/2012    MDT EnPulse implanted by Dr Amil Amen for SSS; generator change 10/29/2012 by Dr Johney Frame MDT Jana Half pacemaker  . CAD (coronary artery disease)     s/p CABG 2005  . Macular degeneration   . Hyperlipidemia   . Depression     mild per pt  . Sporotrichosis     remote  . History of rhabdomyolysis   . Ingrowing nail 06/11/2012  . Abnormality of gait 06/11/2012  . Major depressive disorder, single episode, unspecified 04/28/2012  . Unspecified glaucoma 04/28/2012  . Coronary atherosclerosis of native coronary artery 04/28/2012  . Atrial fibrillation 04/28/2012  . Congestive heart failure, unspecified 04/28/2012  . Reflux esophagitis 04/28/2012  . Hypertrophy of prostate without urinary obstruction and other lower urinary tract symptoms (LUTS) 04/28/2012  . Insomnia, unspecified 04/28/2012  . Edema 04/28/2012  . Allergy, unspecified not elsewhere classified 04/28/2012  . Personal history of fall 04/28/2012  . Cardiac pacemaker in situ 04/28/2012  . Other specified disease of white blood cells 04/26/2012  . Alcohol abuse 04/26/2012  . Chronic airway obstruction, not elsewhere classified 04/26/2012  . Muscle weakness (generalized) 04/26/2012  .  Rhabdomyolysis   . CHF (congestive heart failure) 04/23/2012  . Degenerative joint disease 04/21/2012  . Essential hypertension 10/17/2012  . Fall 04/21/2012    Prolonged downtime 04/20/2012   . GERD (gastroesophageal reflux disease) 09/13/2012  . Hip pain, right 09/02/2013  . Insomnia 11/15/2012  . NSTEMI (non-ST elevated myocardial infarction) 04/23/2012    Related to the stress of rhabdomyolysis and prolonged downtime prior to admission in a patient with known coronary artery disease   . Pacemaker 04/21/2012    Near EOL.   . Paroxysmal atrial tachycardia 04/21/2012    Brief episodes. Not felt to be an anticoagulation candidate because of age and frailty   . Pericarditis 04/21/2012  . Seborrheic eczema 12/03/2012  . Tachy-brady syndrome 04/21/2012    Pacemaker is near EOL   . Unspecified constipation 09/13/2012  . Lumbago   . Synovial cyst of wrist 11/10/2014    Left wrist     Past Surgical History  Procedure Laterality Date  . Pacemaker insertion  10/14/2003;10/29/2012    MDT Implanted by Dr Amil Amen for SSS; generator change 10/29/2012 by Dr Johney Frame [employer] [place]  . Coronary artery bypass graft  2005  . Bilateral knee arthroplasty  09/28/83; 05/17/1992    right then left  . Appendectomy    . Yag laser application Right 12/26/1973    eye  . Hernia repair Right 05/19/1976  . Transurethral resection of prostate  07/27/1983  . Cystoscopy  08/14/85  . Epididymis surgery  09/17/1985  . Angioplasty  9/11/190    Dr. Katrinka Blazing  .  Replacement total knee bilateral  1992 and 1996  . Cataract extraction Right 10/30/1996  . Shoulder open rotator cuff repair Left 02/07/1996  . Back surgery  03/11/2001  . Yag laser application Right 04/30/2001  . Yag laser application Left 05/07/2001  . Toe surgery Right 2005    correction  . Carpal tunnel release Left   . Elbow surgery      repair  . Carpal tunnel release Right 2007  . Rotator cuff repair Right 2008  . Sigmoidoscopy  08/26/2001  . Pacemaker generator  change  10/29/12    Dr. Johney Frame  . Pacemaker generator change N/A 10/29/2012    Procedure: PACEMAKER GENERATOR CHANGE;  Surgeon: Hillis Range, MD;  [facility] C[facility] LAB;  Service: Cardiovascular;  Laterality: N/A;    Social History   Social History  . Marital Status: Single    Spouse Name: N/A  . Number of Children: N/A  . Years of Education: N/A   Occupational History  . retired IT trainer    Social History Main Topics  . Smoking status: Never Smoker   . Smokeless tobacco: Never Used  . Alcohol Use: No  . Drug Use: No  . Sexual Activity: No   Other Topics Concern  . Not on file   Social History Narrative   Pt lives in assisted living at [facility] following a fall 12/13.   Retired IT trainer.   Widowed   No siblings    MOST  Form signed   DNR   Walks with walker   Exercise -self in room each morning     Family History  Problem Relation Age of Onset  . Cancer    . CVA Mother   . CVA Father     Allergies  Allergen Reactions  . Celebrex [Celecoxib]     Lips swelled  . Sulfur     Lips swelled  . Vioxx [Rofecoxib] Other (See Comments)    unknown    Current Outpatient Prescriptions  Medication Sig Dispense Refill  . acetaminophen (TYLENOL) 650 MG CR tablet Take 650 mg by mouth 2 (two) times daily as needed. For pain    . amoxicillin (AMOXIL) 500 MG capsule Take 2,000 mg by mouth daily as needed (prior to surgery).    Marland Kitchen aspirin 81 MG tablet Take 81 mg by mouth daily.    . bimatoprost (LUMIGAN) 0.01 % SOLN Place 1 drop into both eyes at bedtime.     . carboxymethylcellulose (REFRESH PLUS) 0.5 % SOLN 1 drop. Both eyes four times daily as needed for dry eyes    . finasteride (PROSCAR) 5 MG tablet Take one tablet by mouth once daily    . fluticasone (FLONASE) 50 MCG/ACT nasal spray Place 1 spray into both nostrils 2 (two) times daily.    . furosemide (LASIX) 20 MG tablet Take one tablet by mouth once daily. One twice weekly as needed for increased weight 2-3 lbs 30 tablet 5   . guaiFENesin (MUCINEX) 600 MG 12 hr tablet Take 600 mg by mouth 2 (two) times daily.     Marland Kitchen HYDROcodone-acetaminophen (NORCO/VICODIN) 5-325 MG per tablet Take 1 tablet by mouth every 8 (eight) hours as needed (pain).    Marland Kitchen KLOR-CON M10 10 MEQ tablet Take 1 tablet by mouth twice weekly as needed with Lasix    . Liniments (SALONPAS) PADS Apply topically. Apply fresh patch daily to lower back    . lisinopril (PRINIVIL,ZESTRIL) 20 MG tablet Take one tablet twice daily for blood  pressure    . loratadine (CLARITIN) 10 MG tablet Take 10 mg by mouth daily.    . methadone (DOLOPHINE) 5 MG tablet Take 1 tablet every 12 hours to relieve pain 60 tablet 0  . metoprolol tartrate (LOPRESSOR) 25 MG tablet One twice daily to control blood pressure (Patient taking differently: Take 1 tablet by mouth twice daily to control blood pressure) 180 tablet 3  . Multiple Vitamins-Minerals (ICAPS PO) Take by mouth. Take one daily    . naproxen sodium (ANAPROX) 220 MG tablet Take 220 mg by mouth 2 (two) times daily with a meal.    . nitroGLYCERIN (NITROSTAT) 0.4 MG SL tablet Place 0.4 mg under the tongue every 5 (five) minutes as needed. For chest pain    . omeprazole (PRILOSEC) 20 MG capsule Take 20 mg by mouth daily.    Bertram Gala Glycol-Propyl Glycol 0.4-0.3 % SOLN Apply to eye. One drop both eyes twice daily    . polyethylene glycol (MIRALAX / GLYCOLAX) packet Take 17 g by mouth daily. Mix with 8 ounces of water or juice and take by mouth every other day for constipation.    . sertraline (ZOLOFT) 50 MG tablet One daily to help depression (Patient taking differently: Take one tablet by mouth daily to help depression) 30 tablet 5  . Tamsulosin HCl (FLOMAX) 0.4 MG CAPS Take 0.4 mg by mouth daily.    . VOLTAREN 1 % GEL Apply thin layer to joints twice daily, apply to lower back twice daily  10  . zolpidem (AMBIEN) 5 MG tablet Take 5 mg by mouth. Take one tablet at bedtime for rest     No current facility-administered  medications for this visit.    ROS- all systems are reviewed and negative except as per HPI  Physical Exam: Filed Vitals:   12/21/14 1535  BP: 120/60  Pulse: 75  Height:  (1.727 m)  Weight: 83.643 kg (184 lb 6.4 oz)  SpO2: 93%    GEN- The patient is well appearing, alert and oriented x 3 today.   Head- normocephalic, atraumatic Eyes-  Sclera clear, conjunctiva pink Ears- hearing intact Oropharynx- clear Neck- supple  Lungs- Clear to ausculation bilaterally, normal work of breathing Chest- R sided pacemaker pocket is well healed Heart- Regular rate and rhythm,  GI- soft, NT, ND, + BS Extremities- no clubbing, cyanosis, or edema Psych- euthymic mood, full affect, very pleasant Neuro- strength and sensation are intact  Pacemaker interrogation- reviewed in detail today,  See PACEART report  Assessment and Plan:  1. Sick sinus syndrome Normal pacemaker function See Pace Art report No changes today  2. CAD Stable No change required today  3. HTN Stable No change required today  4. afib No sustained episodes in past 2 years Pt would like to avoid anticoagulation  carelink every 3 months Return to see EP NP in 1 year Follow-up with Dr Katrinka Blazing for routine cardiology care  Hillis Range MD, [facility] 12/21/2014 4:01 PM

## 2014-12-21 NOTE — Patient Instructions (Signed)
Medication Instructions:  Your physician recommends that you continue on your current medications as directed. Please refer to the Current Medication list given to you today.   Labwork: None ordered  Testing/Procedures: None ordered  Follow-Up:  Your physician wants you to follow-up in: 12 months with Gypsy Balsam, NP You will receive a reminder letter in the mail two months in advance. If you don't receive a letter, please call our office to schedule the follow-up appointment.   Remote monitoring is used to monitor your Pacemaker  from home. This monitoring reduces the number of office visits required to check your device to one time per year. It allows [place] to keep an eye on the functioning of your device to ensure it is working properly. You are scheduled for a device check from home on 03/22/15. You may send your transmission at any time that day. If you have a wireless device, the transmission will be sent automatically. After your physician reviews your transmission, you will receive a postcard with your next transmission date.    Any Other Special Instructions Will Be Listed Below (If Applicable).

## 2014-12-30 ENCOUNTER — Encounter: Payer: Self-pay | Admitting: Internal Medicine

## 2015-01-07 DIAGNOSIS — H3532 Exudative age-related macular degeneration: Secondary | ICD-10-CM | POA: Diagnosis not present

## 2015-03-16 ENCOUNTER — Non-Acute Institutional Stay: Payer: Medicare Other | Admitting: Internal Medicine

## 2015-03-16 ENCOUNTER — Encounter: Payer: Self-pay | Admitting: Internal Medicine

## 2015-03-16 VITALS — BP 122/60 | HR 56 | Temp 97.7°F | Wt 189.0 lb

## 2015-03-16 DIAGNOSIS — I2510 Atherosclerotic heart disease of native coronary artery without angina pectoris: Secondary | ICD-10-CM

## 2015-03-16 DIAGNOSIS — G8929 Other chronic pain: Secondary | ICD-10-CM

## 2015-03-16 DIAGNOSIS — I10 Essential (primary) hypertension: Secondary | ICD-10-CM | POA: Diagnosis not present

## 2015-03-16 DIAGNOSIS — R05 Cough: Secondary | ICD-10-CM

## 2015-03-16 DIAGNOSIS — R609 Edema, unspecified: Secondary | ICD-10-CM | POA: Diagnosis not present

## 2015-03-16 DIAGNOSIS — M5440 Lumbago with sciatica, unspecified side: Secondary | ICD-10-CM

## 2015-03-16 DIAGNOSIS — R059 Cough, unspecified: Secondary | ICD-10-CM

## 2015-03-16 NOTE — Progress Notes (Signed)
Patient ID: Caleb Nash, male   DOB: August 01, 1913, 81 y.o.   MRN: [id number]    [facility]     Place of Service: Clinic (12)     Allergies  Allergen Reactions  . Celebrex [Celecoxib]     Lips swelled  . Sulfur     Lips swelled  . Vioxx [Rofecoxib] Other (See Comments)    unknown    Chief Complaint  Patient presents with  . Medical Management of Chronic Issues    back pain, blood pressure, CHF, decubitus ulcer sacral region    HPI:  Chronic midline low back pain with sciatica, sciatica laterality unspecified - pain has been controlled for the most part on current medications  Coronary artery disease involving native coronary artery of native heart without angina pectoris - asymptomatic  Essential hypertension - controlled  Edema, unspecified type - trace bipedal and lower legs  Cough -persistent. Produces a small amount of clear phlegm from time to time. Maybe a little worse around mealtimes. Possibly related to mild dysphagia.   Decubitus of the buttock has healed.  Medications: Patient's Medications  New Prescriptions   No medications on file  Previous Medications   ACETAMINOPHEN (TYLENOL) 650 MG CR TABLET    Take 650 mg by mouth 2 (two) times daily as needed. For pain   AMOXICILLIN (AMOXIL) 500 MG CAPSULE    Take 2,000 mg by mouth daily as needed (prior to surgery).   ASPIRIN 81 MG TABLET    Take 81 mg by mouth daily.   BIMATOPROST (LUMIGAN) 0.01 % SOLN    Place 1 drop into both eyes at bedtime.    CARBOXYMETHYLCELLULOSE (REFRESH PLUS) 0.5 % SOLN    1 drop. Both eyes four times daily as needed for dry eyes   FINASTERIDE (PROSCAR) 5 MG TABLET    Take one tablet by mouth once daily   FLUTICASONE (FLONASE) 50 MCG/ACT NASAL SPRAY    Place 1 spray into both nostrils 2 (two) times daily.   FUROSEMIDE (LASIX) 20 MG TABLET    Take one tablet by mouth once daily. One twice weekly as needed for increased weight 2-3 lbs   GUAIFENESIN (MUCINEX) 600 MG 12 HR  TABLET    Take 600 mg by mouth 2 (two) times daily.    HYDROCODONE-ACETAMINOPHEN (NORCO/VICODIN) 5-325 MG PER TABLET    Take 1 tablet by mouth every 8 (eight) hours as needed (pain).   KLOR-CON M10 10 MEQ TABLET    Take 1 tablet by mouth twice weekly as needed with Lasix   LINIMENTS (SALONPAS) PADS    Apply topically. Apply fresh patch daily to lower back   LISINOPRIL (PRINIVIL,ZESTRIL) 20 MG TABLET    Take one tablet twice daily for blood pressure   LORATADINE (CLARITIN) 10 MG TABLET    Take 10 mg by mouth daily.   METHADONE (DOLOPHINE) 5 MG TABLET    Take 1 tablet every 12 hours to relieve pain   METOPROLOL TARTRATE (LOPRESSOR) 25 MG TABLET    One twice daily to control blood pressure   MULTIPLE VITAMINS-MINERALS (ICAPS PO)    Take by mouth. Take one daily   NAPROXEN SODIUM (ANAPROX) 220 MG TABLET    Take 220 mg by mouth 2 (two) times daily with a meal.   NITROGLYCERIN (NITROSTAT) 0.4 MG SL TABLET    Place 0.4 mg under the tongue every 5 (five) minutes as needed. For chest pain   OMEPRAZOLE (PRILOSEC) 20 MG CAPSULE  Take 20 mg by mouth daily.   POLYETHYL GLYCOL-PROPYL GLYCOL 0.4-0.3 % SOLN    Apply to eye. One drop both eyes twice daily   POLYETHYLENE GLYCOL (MIRALAX / GLYCOLAX) PACKET    Take 17 g by mouth daily. Mix with 8 ounces of water or juice and take by mouth every other day for constipation.   SERTRALINE (ZOLOFT) 50 MG TABLET    One daily to help depression   TAMSULOSIN HCL (FLOMAX) 0.4 MG CAPS    Take 0.4 mg by mouth daily.   VOLTAREN 1 % GEL    Apply thin layer to joints twice daily, apply to lower back twice daily   ZOLPIDEM (AMBIEN) 5 MG TABLET    Take 5 mg by mouth. Take one tablet at bedtime for rest  Modified Medications   No medications on file  Discontinued Medications   No medications on file     Review of Systems  Constitutional: Negative.  Negative for activity change, appetite change, fatigue and unexpected weight change.  HENT: Negative.  Negative for hearing  loss, sinus pressure and sore throat.   Eyes:       Hx macular degeneration. Receiving injections every 6-8 weeks.  Respiratory: Positive for cough. Negative for apnea, chest tightness, shortness of breath, wheezing and stridor.   Cardiovascular: Positive for leg swelling. Negative for chest pain and palpitations.  Gastrointestinal: Negative for nausea, vomiting, abdominal pain and abdominal distention.       Flatulence  Endocrine: Negative.   Genitourinary: Negative for urgency, frequency and decreased urine volume.       BPH.  Musculoskeletal: Positive for back pain and gait problem (uunstable. uses walker). Negative for neck pain and neck stiffness.       Reviewed x-ray reports from 2009 and recent CT of the lumbar spine.. Patient has foraminal stenoses and  spinal stenosis now. There is multiple intervertebral degenerative disc disease. He has had steroid injections at [place], Dr. Laurence Spates. Some pain radiates down the right leg. He is now comfortable moving around using his 4 wheel walker. He is able to get in bed and lay down comfortably overnight.  Skin:       Dry. Itching on dorsum of hands.  Allergic/Immunologic: Negative.   Neurological: Negative.   Hematological: Negative.   Psychiatric/Behavioral: Positive for sleep disturbance (iinsomnia. Ambien helps.).    Filed Vitals:   03/16/15 0936  BP: 122/60  Pulse: 56  Temp: 97.7 F (36.5 C)  TempSrc: Oral  Weight: 189 lb (85.73 kg)  SpO2: 91%   Body mass index is 28.74 kg/(m^2).  Physical Exam  Constitutional: He is oriented to person, place, and time. He appears well-nourished. No distress.  Frail  HENT:  Head: Normocephalic and atraumatic.  Right Ear: External ear normal.  Left Ear: External ear normal.  Nose: Nose normal.  Mouth/Throat: Oropharynx is clear and moist.  Mild decrease in hearing  Eyes: Conjunctivae and EOM are normal. Pupils are equal, round, and reactive to light.  Neck: No  JVD present. No tracheal deviation present. No thyromegaly present.  Cardiovascular: Normal rate, regular rhythm and intact distal pulses.  Exam reveals friction rub. Exam reveals no gallop.   No murmur heard. Pulmonary/Chest: Effort normal. No respiratory distress. He has no wheezes. He has rales (Right chest).  Abdominal: He exhibits no distension and no mass. There is no tenderness.  Large upper diastasis recti  Musculoskeletal: Normal range of motion. He exhibits edema (1+ bipedal). He exhibits no  tenderness.  Using walker. Bilateral knee prosthesis. Tender lumbar spine and shading towards the left side of the small of the back.  Lymphadenopathy:    He has no cervical adenopathy.  Neurological: He is alert and oriented to person, place, and time. No cranial nerve deficit. Coordination normal.  Diminished sensation to vibration in the right foot.  Skin: Skin is dry. No rash noted. No erythema. No pallor.  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 Multiple SK.   Psychiatric: He has a normal mood and affect. His behavior is normal. Judgment and thought content normal.     Labs reviewed: Lab Summary Latest Ref Rng 10/19/2014  Hemoglobin 13.5 - 17.5 g/dL 12.9(A)  Hematocrit 41 - 53 % 37(A)  White count - 8.1  Platelet count 150 - 399 K/L 191  Sodium 137 - 147 mmol/L 135(A)  Potassium 3.4 - 5.3 mmol/L 4.4  Calcium - (None)  Phosphorus - (None)  Creatinine 0.6 - 1.3 mg/dL 1.2  AST 14 - 40 U/L 23  Alk Phos 25 - 125 U/L 56  Bilirubin - (None)  Glucose - 113  Cholesterol - (None)  HDL cholesterol - (None)  Triglycerides - (None)  LDL Direct - (None)  LDL Calc - (None)  Total protein - (None)  Albumin - (None)   Lab Results  Component Value Date   TSH 1.10 10/19/2014   Lab Results  Component Value Date   BUN 32* 10/19/2014   Lab Results  Component Value Date   HGBA1C 6.1* 04/22/2012       Assessment/Plan  1. Chronic  midline low back pain with sciatica, sciatica laterality unspecified Indications for his current medicines that are being given on a regular basis do not adequately control the pain, buthe is relatively pain-free and is moving about much more freely with the help of his walker.  2. Coronary artery disease involving native coronary artery of native heart without angina pectoris Asymptomatic  3. Essential hypertension Controlled  4. Edema, unspecified type Improved  5. Cough Persistent. Mucinex has not helped. Possibly related to mild dysphagia.

## 2015-03-22 ENCOUNTER — Telehealth: Payer: Self-pay | Admitting: Cardiology

## 2015-03-22 ENCOUNTER — Other Ambulatory Visit: Payer: Self-pay | Admitting: *Deleted

## 2015-03-22 ENCOUNTER — Ambulatory Visit (INDEPENDENT_AMBULATORY_CARE_PROVIDER_SITE_OTHER): Payer: Medicare Other | Admitting: *Deleted

## 2015-03-22 DIAGNOSIS — I495 Sick sinus syndrome: Secondary | ICD-10-CM

## 2015-03-22 MED ORDER — ZOLPIDEM TARTRATE 5 MG PO TABS: ORAL_TABLET | ORAL | Status: DC

## 2015-03-22 NOTE — Telephone Encounter (Signed)
Confirmed remote transmission w/ pt nurse.   

## 2015-03-22 NOTE — Telephone Encounter (Signed)
[facility] 

## 2015-03-24 NOTE — Progress Notes (Signed)
Remote pacemaker transmission.   

## 2015-04-01 LAB — CUP PACEART REMOTE DEVICE CHECK
Battery Remaining Longevity: 94 mo
Brady Statistic AP VP Percent: 98 %
Brady Statistic AP VS Percent: 0 %
Brady Statistic AS VP Percent: 2 %
Brady Statistic AS VS Percent: 0 %
Date Time Interrogation Session: 20161128192834
Implantable Lead Implant Date: 20050622
Lead Channel Setting Pacing Amplitude: 2 V
Lead Channel Setting Pacing Amplitude: 2.5 V
Lead Channel Setting Pacing Pulse Width: 0.4 ms
Lead Channel Setting Sensing Sensitivity: 4 mV
MDC IDC LEAD IMPLANT DT: 20050622
MDC IDC LEAD LOCATION: 753859
MDC IDC LEAD LOCATION: 753860
MDC IDC MSMT BATTERY IMPEDANCE: 229 Ohm
MDC IDC MSMT BATTERY VOLTAGE: 2.78 V
MDC IDC MSMT LEADCHNL RA IMPEDANCE VALUE: 447 Ohm
MDC IDC MSMT LEADCHNL RV IMPEDANCE VALUE: 691 Ohm

## 2015-04-02 ENCOUNTER — Encounter: Payer: Self-pay | Admitting: Cardiology

## 2015-04-08 DIAGNOSIS — H353211 Exudative age-related macular degeneration, right eye, with active choroidal neovascularization: Secondary | ICD-10-CM | POA: Diagnosis not present

## 2015-04-15 DIAGNOSIS — H401131 Primary open-angle glaucoma, bilateral, mild stage: Secondary | ICD-10-CM | POA: Diagnosis not present

## 2015-04-15 DIAGNOSIS — H353232 Exudative age-related macular degeneration, bilateral, with inactive choroidal neovascularization: Secondary | ICD-10-CM | POA: Diagnosis not present

## 2015-05-12 ENCOUNTER — Other Ambulatory Visit: Payer: Self-pay

## 2015-05-12 DIAGNOSIS — M5440 Lumbago with sciatica, unspecified side: Secondary | ICD-10-CM

## 2015-05-12 MED ORDER — METHADONE HCL 5 MG PO TABS: ORAL_TABLET | ORAL | Status: DC

## 2015-05-25 ENCOUNTER — Encounter: Payer: Self-pay | Admitting: Nurse Practitioner

## 2015-05-25 ENCOUNTER — Non-Acute Institutional Stay: Payer: Medicare Other | Admitting: Nurse Practitioner

## 2015-05-25 DIAGNOSIS — M4806 Spinal stenosis, lumbar region: Secondary | ICD-10-CM

## 2015-05-25 DIAGNOSIS — M48061 Spinal stenosis, lumbar region without neurogenic claudication: Secondary | ICD-10-CM

## 2015-05-25 DIAGNOSIS — G4700 Insomnia, unspecified: Secondary | ICD-10-CM | POA: Diagnosis not present

## 2015-05-25 DIAGNOSIS — N400 Enlarged prostate without lower urinary tract symptoms: Secondary | ICD-10-CM | POA: Diagnosis not present

## 2015-05-25 DIAGNOSIS — K5900 Constipation, unspecified: Secondary | ICD-10-CM

## 2015-05-25 DIAGNOSIS — F329 Major depressive disorder, single episode, unspecified: Secondary | ICD-10-CM

## 2015-05-25 DIAGNOSIS — K219 Gastro-esophageal reflux disease without esophagitis: Secondary | ICD-10-CM

## 2015-05-25 DIAGNOSIS — F32A Depression, unspecified: Secondary | ICD-10-CM

## 2015-05-25 DIAGNOSIS — R609 Edema, unspecified: Secondary | ICD-10-CM

## 2015-05-25 DIAGNOSIS — I10 Essential (primary) hypertension: Secondary | ICD-10-CM | POA: Diagnosis not present

## 2015-05-25 NOTE — Assessment & Plan Note (Signed)
Continue Tamsulosin 0.4mg and Finasteride 5mg daily. Stable.  

## 2015-05-25 NOTE — Assessment & Plan Note (Signed)
trace in BLE, continue Furosemide 20mg daily. 

## 2015-05-25 NOTE — Assessment & Plan Note (Signed)
Sleeps well, continue Ambien  nightly.

## 2015-05-25 NOTE — Assessment & Plan Note (Signed)
Stable,  takes MiraLax daily.  

## 2015-05-25 NOTE — Progress Notes (Signed)
Patient ID: Caleb Nash, male   DOB: [birth year], 82 y.o.   MRN: [id number]  Location:  [facility] Provider:  Chipper Oman NP  Code Status:  DNR Goals of care: Advanced Directive information Does patient have an advance directive?: Yes, Type of Advance Directive: Healthcare Power of [place];Out of facility DNR (pink MOST or yellow form)  Chief Complaint  Patient presents with  . Medical Management of Chronic Issues    Routine Visit     HPI: Patient is a 82 y.o. male seen in the AL at [facility] today for evaluation of HTN, controlled on Lisinopril, Metoprolol, and Furosemide, trace edema in ankles has no change, sleeps with aid of Ambien, his mood is table while taking Sertraline , not constipation while on MiraLax daily. Hx of GERD controlled symptoms with Omeprazole. Chronic back pain is better managed with Methadone  bid, Norco prn, Naproxen  bid. He is stable in AL setting.  Review of Systems:  ROS  Past Medical History  Diagnosis Date  . Osteoarthritis   . HTN (hypertension)   . BPH (benign prostatic hyperplasia)   . Sick sinus syndrome (HCC) 10/14/2003; 10/29/2012    MDT EnPulse implanted by Dr Amil Amen for SSS; generator change 10/29/2012 by Dr Johney Frame MDT Jana Half pacemaker  . CAD (coronary artery disease)     s/p CABG 2005  . Macular degeneration   . Hyperlipidemia   . Depression     mild per pt  . Sporotrichosis     remote  . History of rhabdomyolysis   . Ingrowing nail 06/11/2012  . Abnormality of gait 06/11/2012  . Major depressive disorder, single episode, unspecified (HCC) 04/28/2012  . Unspecified glaucoma 04/28/2012  . Coronary atherosclerosis of native coronary artery 04/28/2012  . Atrial fibrillation (HCC) 04/28/2012  . Congestive heart failure, unspecified 04/28/2012  . Reflux esophagitis 04/28/2012  . Hypertrophy of prostate without urinary obstruction and other lower urinary tract symptoms (LUTS) 04/28/2012  . Insomnia, unspecified 04/28/2012  . Edema 04/28/2012    . Allergy, unspecified not elsewhere classified 04/28/2012  . Personal history of fall 04/28/2012  . Cardiac pacemaker in situ 04/28/2012  . Other specified disease of white blood cells 04/26/2012  . Alcohol abuse 04/26/2012  . Chronic airway obstruction, not elsewhere classified 04/26/2012  . Muscle weakness (generalized) 04/26/2012  . Rhabdomyolysis   . CHF (congestive heart failure) (HCC) 04/23/2012  . Degenerative joint disease 04/21/2012  . Essential hypertension 10/17/2012  . Fall 04/21/2012    Prolonged downtime 04/20/2012   . GERD (gastroesophageal reflux disease) 09/13/2012  . Hip pain, right 09/02/2013  . Insomnia 11/15/2012  . NSTEMI (non-ST elevated myocardial infarction) (HCC) 04/23/2012    Related to the stress of rhabdomyolysis and prolonged downtime prior to admission in a patient with known coronary artery disease   . Pacemaker 04/21/2012    Near EOL.   . Paroxysmal atrial tachycardia (HCC) 04/21/2012    Brief episodes. Not felt to be an anticoagulation candidate because of age and frailty   . Pericarditis 04/21/2012  . Seborrheic eczema 12/03/2012  . Tachy-brady syndrome (HCC) 04/21/2012    Pacemaker is near EOL   . Unspecified constipation 09/13/2012  . Lumbago   . Synovial cyst of wrist 11/10/2014    Left wrist      Patient Active Problem List   Diagnosis Date Noted  . Spinal stenosis of lumbar region 12/15/2014  . Synovial cyst of wrist 11/10/2014  . Edema 07/07/2014  . Cough 07/07/2014  .  Seborrheic keratosis 01/13/2014  . Flatulence 01/13/2014  . DNR (do not resuscitate) 11/28/2013  . Restless leg 10/21/2013  . Dysphagia, unspecified(787.20) 09/09/2013  . Osteoarthritis   . Lumbago   . Hip pain, right 09/02/2013  . Seborrheic eczema 12/03/2012  . Insomnia 11/15/2012  . Sick sinus syndrome (HCC) 10/17/2012  . Essential hypertension 10/17/2012  . Constipation 09/13/2012  . GERD (gastroesophageal reflux disease) 09/13/2012  . CHF (congestive heart failure) (HCC)  04/23/2012  . NSTEMI (non-ST elevated myocardial infarction) (HCC) 04/23/2012    Class: Acute  . Pericarditis 04/21/2012    Class: Acute  . CAD (coronary artery disease) 04/21/2012    Class: Chronic  . Tachy-brady syndrome (HCC) 04/21/2012    Class: Chronic  . Fall 04/21/2012    Class: Acute  . Pacemaker 04/21/2012    Class: Chronic  . Depression 04/21/2012    Class: Chronic  . Paroxysmal atrial tachycardia (HCC) 04/21/2012    Class: Chronic  . Macular degeneration 04/21/2012    Class: Chronic  . Degenerative joint disease 04/21/2012    Class: Chronic  . BPH (benign prostatic hyperplasia)     Allergies  Allergen Reactions  . Celebrex [Celecoxib]     Lips swelled  . Sulfur     Lips swelled  . Vioxx [Rofecoxib] Other (See Comments)    unknown    Medications: Patient's Medications  New Prescriptions   No medications on file  Previous Medications   ACETAMINOPHEN (TYLENOL) 650 MG CR TABLET    Take 650 mg by mouth 2 (two) times daily as needed. For pain   AMOXICILLIN (AMOXIL) 500 MG CAPSULE    Take 2,000 mg by mouth daily as needed (prior to surgery).   ASPIRIN 81 MG TABLET    Take 81 mg by mouth daily.   BIMATOPROST (LUMIGAN) 0.01 % SOLN    Place 1 drop into both eyes at bedtime.    CARBOXYMETHYLCELLULOSE (REFRESH PLUS) 0.5 % SOLN    1 drop. Both eyes four times daily as needed for dry eyes   FINASTERIDE (PROSCAR) 5 MG TABLET    Take one tablet by mouth once daily   FLUTICASONE (FLONASE) 50 MCG/ACT NASAL SPRAY    Place 1 spray into both nostrils 2 (two) times daily.   FUROSEMIDE (LASIX) 20 MG TABLET    Take one tablet by mouth once daily. One twice weekly as needed for increased weight 2-3 lbs   GUAIFENESIN (MUCINEX) 600 MG 12 HR TABLET    Take 600 mg by mouth 2 (two) times daily.    HYDROCODONE-ACETAMINOPHEN (NORCO/VICODIN) 5-325 MG PER TABLET    Take 1 tablet by mouth every 8 (eight) hours as needed (pain).   KLOR-CON M10 10 MEQ TABLET    Take 1 tablet by mouth twice  weekly as needed with Lasix   LINIMENTS (SALONPAS EX)    Apply topically. Apply patch daily to lower back   LISINOPRIL (PRINIVIL,ZESTRIL) 20 MG TABLET    Take one tablet twice daily for blood pressure   LORATADINE (CLARITIN) 10 MG TABLET    Take 10 mg by mouth daily.   METHADONE (DOLOPHINE) 5 MG TABLET    Take 1 tablet every 12 hours to relieve pain   METOPROLOL TARTRATE (LOPRESSOR) 25 MG TABLET    One twice daily to control blood pressure   MULTIPLE VITAMINS-MINERALS (ICAPS PO)    Take by mouth. Take one daily   NAPROXEN SODIUM (ANAPROX) 220 MG TABLET    Take 220 mg by mouth 2 (  two) times daily with a meal.   NITROGLYCERIN (NITROSTAT) 0.4 MG SL TABLET    Place 0.4 mg under the tongue every 5 (five) minutes as needed. For chest pain   OMEPRAZOLE (PRILOSEC) 20 MG CAPSULE    Take 20 mg by mouth daily.   POLYETHYL GLYCOL-PROPYL GLYCOL 0.4-0.3 % SOLN    Apply to eye. One drop both eyes twice daily   POLYETHYLENE GLYCOL (MIRALAX / GLYCOLAX) PACKET    Take 17 g by mouth daily. Mix with 8 ounces of water or juice and take by mouth every other day for constipation.   SERTRALINE (ZOLOFT) 50 MG TABLET    One daily to help depression   TAMSULOSIN HCL (FLOMAX) 0.4 MG CAPS    Take 0.4 mg by mouth daily.   ZOLPIDEM (AMBIEN) 5 MG TABLET    Take one tablet by mouth at bedtime for rest  Modified Medications   No medications on file  Discontinued Medications   LINIMENTS (SALONPAS) PADS    Apply topically. Apply fresh patch daily to lower back   VOLTAREN 1 % GEL    Apply thin layer to joints twice daily, apply to lower back twice daily    Physical Exam: There were no vitals filed for this visit. There is no weight on file to calculate BMI.  Physical Exam  Labs reviewed: Basic Metabolic Panel:  Recent Labs  81/19/14  NA 135*  K 4.4  BUN 32*  CREATININE 1.2    Liver Function Tests:  Recent Labs  10/19/14  AST 23  ALT 13  ALKPHOS 56    CBC:  Recent Labs  10/19/14  WBC 8.1  HGB 12.9*    HCT 37*  PLT 191    Lab Results  Component Value Date   TSH 1.10 10/19/2014   Lab Results  Component Value Date   HGBA1C 6.1* 04/22/2012   Lab Results  Component Value Date   CHOL 149 04/22/2012   HDL 35* 04/22/2012   LDLCALC 96 04/22/2012   TRIG 89 04/22/2012   CHOLHDL 4.3 04/22/2012    Significant Diagnostic Results since last visit: none  Patient Care Team: Kimber Relic, MD as PCP - General (Internal Medicine) [facility] Man Mast X, NP as Nurse Practitioner (Nurse Practitioner) Lyn Records, MD as Consulting Physician (Cardiology) Barron Alvine, MD as Consulting Physician (Urology) Francisca December, MD as Attending Physician (Cardiology) Leta Speller, MD as Consulting Physician (Dermatology) Elise Benne, MD as Consulting Physician (Ophthalmology)  Assessment/Plan Problem List Items Addressed This Visit    Spinal stenosis of lumbar region - Primary    01/06/14 X-ray Lumbar spine: no acute osseous or soft tissue abnormality. Multilevel disc degenerative changes on the lumbar spine likely neural foraminal narrowing at L5-S1 and possible the other levels.  10/16/14 dc Tramadol-not adequate, start Norco 5/325 1/2 q8h, Salonpas OTC, the patient stated he only tolerate bedrest 4-5hours then he sits in the chair which may contribute to the left buttock pressure injury 05/25/15 pain is controlled, continue Methadone  bid, prn Norco q8, Naproxen  bid.       Insomnia    Sleeps well, continue Ambien  nightly.       GERD (gastroesophageal reflux disease)    Stable, continue Omeprazole  daily.        Essential hypertension (Chronic)    Controlled, takes Lisinopril , Metoprolol , Furosemide  daily.       Edema (Chronic)    trace in BLE, continue Furosemide  20mg  daily.       Depression (Chronic)    Mood is stable, continue Sertraline 50mg  daily.       Constipation    Stable, takes MiraLax daily.       BPH (benign prostatic  hyperplasia)    Continue Tamsulosin 0.4mg  and Finasteride 5mg  daily. Stable.           Family/ staff Communication: continue AL for care needs.   Labs/tests ordered: none  ManXie Mast NP Geriatrics [facility] [address], [place] [address] On Call:  [phone] & follow prompts after 5pm & weekends Office Phone:  [phone] Office Fax:  [phone]

## 2015-05-25 NOTE — Assessment & Plan Note (Signed)
Controlled, takes Lisinopril , Metoprolol , Furosemide  daily.

## 2015-05-25 NOTE — Assessment & Plan Note (Signed)
Stable, continue Omeprazole 20mg daily.  

## 2015-05-25 NOTE — Assessment & Plan Note (Signed)
01/06/14 X-ray Lumbar spine: no acute osseous or soft tissue abnormality. Multilevel disc degenerative changes on the lumbar spine likely neural foraminal narrowing at L5-S1 and possible the other levels.  10/16/14 dc Tramadol-not adequate, start Norco 5/325 1/2 q8h, Salonpas OTC, the patient stated he only tolerate bedrest 4-5hours then he sits in the chair which may contribute to the left buttock pressure injury 05/25/15 pain is controlled, continue Methadone  bid, prn Norco q8, Naproxen  bid.

## 2015-05-25 NOTE — Progress Notes (Signed)
Patient ID: Caleb Nash, male   DOB: 02/19/1914, 82 y.o.   MRN: [id number]

## 2015-05-25 NOTE — Assessment & Plan Note (Signed)
Mood is stable, continue Sertraline 50mg daily.  

## 2015-06-21 ENCOUNTER — Telehealth: Payer: Self-pay | Admitting: Cardiology

## 2015-06-21 ENCOUNTER — Encounter: Payer: Medicare Other | Admitting: *Deleted

## 2015-06-21 NOTE — Telephone Encounter (Signed)
Confirmed remote transmission w/ pt nurse.   

## 2015-06-22 ENCOUNTER — Encounter: Payer: Self-pay | Admitting: Cardiology

## 2015-06-25 ENCOUNTER — Non-Acute Institutional Stay: Payer: Medicare Other | Admitting: Nurse Practitioner

## 2015-06-25 ENCOUNTER — Encounter: Payer: Self-pay | Admitting: Nurse Practitioner

## 2015-06-25 DIAGNOSIS — F32A Depression, unspecified: Secondary | ICD-10-CM

## 2015-06-25 DIAGNOSIS — M4806 Spinal stenosis, lumbar region: Secondary | ICD-10-CM

## 2015-06-25 DIAGNOSIS — G4700 Insomnia, unspecified: Secondary | ICD-10-CM | POA: Diagnosis not present

## 2015-06-25 DIAGNOSIS — N400 Enlarged prostate without lower urinary tract symptoms: Secondary | ICD-10-CM

## 2015-06-25 DIAGNOSIS — I10 Essential (primary) hypertension: Secondary | ICD-10-CM

## 2015-06-25 DIAGNOSIS — F329 Major depressive disorder, single episode, unspecified: Secondary | ICD-10-CM | POA: Diagnosis not present

## 2015-06-25 DIAGNOSIS — K5900 Constipation, unspecified: Secondary | ICD-10-CM

## 2015-06-25 DIAGNOSIS — K219 Gastro-esophageal reflux disease without esophagitis: Secondary | ICD-10-CM | POA: Diagnosis not present

## 2015-06-25 DIAGNOSIS — M48061 Spinal stenosis, lumbar region without neurogenic claudication: Secondary | ICD-10-CM

## 2015-06-25 DIAGNOSIS — R609 Edema, unspecified: Secondary | ICD-10-CM | POA: Diagnosis not present

## 2015-06-25 NOTE — Assessment & Plan Note (Signed)
Sleeps well, continue Ambien 5mg  nightly.

## 2015-06-25 NOTE — Progress Notes (Signed)
Patient ID: Caleb Nash, male   DOB: 02/21/1914, 82 y.o.   MRN: [id number]  Location:  [facility] Room Number: Al 04 Place of Service:  AL [facility] Provider:  Arna Snipe Judianne Seiple NP  Kimber Relic, MD  Patient Care Team: Kimber Relic, MD as PCP - General (Internal Medicine) [facility] Joclynn Lumb X, NP as Nurse Practitioner (Nurse Practitioner) Lyn Records, MD as Consulting Physician (Cardiology) Barron Alvine, MD as Consulting Physician (Urology) Francisca December, MD as Attending Physician (Cardiology) Leta Speller, MD as Consulting Physician (Dermatology) Elise Benne, MD as Consulting Physician (Ophthalmology)  Extended Emergency Contact Information Primary Emergency Contact: Mortimore,Janet Address: [address]          [place], [place] [address] [place] of [place] Home Phone: [phone] Mobile Phone: [phone] Relation: Daughter  Code Status:  DNR Goals of care: Advanced Directive information Advanced Directives 06/25/2015  Does patient have an advance directive? Yes  Type of Estate agent of [place];Out of facility DNR (pink MOST or yellow form)  Does patient want to make changes to advanced directive? No - Patient declined  Copy of advanced directive(s) in chart? Yes     Chief Complaint  Patient presents with  . Medical Management of Chronic Issues    Routine Visit    HPI:  Pt is a 82 y.o. male seen today for medical management of chronic diseases.  HTN, controlled on Lisinopril, Metoprolol, and Furosemide, trace edema in ankles has no change, sleeps with aid of Ambien, his mood is table while taking Sertraline 50mg , not constipation while on MiraLax daily. Hx of GERD controlled symptoms with Omeprazole. Chronic back pain is better managed with Methadone 5mg  bid, Norco prn, Naproxen 220mg  bid. He is stable in AL setting.     Past Medical History  Diagnosis Date  . Osteoarthritis   . HTN (hypertension)   .  BPH (benign prostatic hyperplasia)   . Sick sinus syndrome (HCC) 10/14/2003; 10/29/2012    MDT EnPulse implanted by Dr Amil Amen for SSS; generator change 10/29/2012 by Dr Johney Frame MDT Jana Half pacemaker  . CAD (coronary artery disease)     s/p CABG 2005  . Macular degeneration   . Hyperlipidemia   . Depression     mild per pt  . Sporotrichosis     remote  . History of rhabdomyolysis   . Ingrowing nail 06/11/2012  . Abnormality of gait 06/11/2012  . Major depressive disorder, single episode, unspecified (HCC) 04/28/2012  . Unspecified glaucoma 04/28/2012  . Coronary atherosclerosis of native coronary artery 04/28/2012  . Atrial fibrillation (HCC) 04/28/2012  . Congestive heart failure, unspecified 04/28/2012  . Reflux esophagitis 04/28/2012  . Hypertrophy of prostate without urinary obstruction and other lower urinary tract symptoms (LUTS) 04/28/2012  . Insomnia, unspecified 04/28/2012  . Edema 04/28/2012  . Allergy, unspecified not elsewhere classified 04/28/2012  . Personal history of fall 04/28/2012  . Cardiac pacemaker in situ 04/28/2012  . Other specified disease of white blood cells 04/26/2012  . Alcohol abuse 04/26/2012  . Chronic airway obstruction, not elsewhere classified 04/26/2012  . Muscle weakness (generalized) 04/26/2012  . Rhabdomyolysis   . CHF (congestive heart failure) (HCC) 04/23/2012  . Degenerative joint disease 04/21/2012  . Essential hypertension 10/17/2012  . Fall 04/21/2012    Prolonged downtime 04/20/2012   . GERD (gastroesophageal reflux disease) 09/13/2012  . Hip pain, right 09/02/2013  . Insomnia 11/15/2012  . NSTEMI (non-ST elevated myocardial infarction) (HCC) 04/23/2012  Related to the stress of rhabdomyolysis and prolonged downtime prior to admission in a patient with known coronary artery disease   . Pacemaker 04/21/2012    Near EOL.   . Paroxysmal atrial tachycardia (HCC) 04/21/2012    Brief episodes. Not felt to be an anticoagulation candidate because of age and frailty   .  Pericarditis 04/21/2012  . Seborrheic eczema 12/03/2012  . Tachy-brady syndrome (HCC) 04/21/2012    Pacemaker is near EOL   . Unspecified constipation 09/13/2012  . Lumbago   . Synovial cyst of wrist 11/10/2014    Left wrist     Past Surgical History  Procedure Laterality Date  . Pacemaker insertion  10/14/2003;10/29/2012    MDT Implanted by Dr Amil Amen for SSS; generator change 10/29/2012 by Dr Johney Frame [employer] [place]  . Coronary artery bypass graft  2005  . Bilateral knee arthroplasty  09/28/83; 05/17/1992    right then left  . Appendectomy    . Yag laser application Right 12/26/1973    eye  . Hernia repair Right 05/19/1976  . Transurethral resection of prostate  07/27/1983  . Cystoscopy  08/14/85  . Epididymis surgery  09/17/1985  . Angioplasty  9/11/190    Dr. Katrinka Blazing  . Replacement total knee bilateral  1992 and 1996  . Cataract extraction Right 10/30/1996  . Shoulder open rotator cuff repair Left 02/07/1996  . Back surgery  03/11/2001  . Yag laser application Right 04/30/2001  . Yag laser application Left 05/07/2001  . Toe surgery Right 2005    correction  . Carpal tunnel release Left   . Elbow surgery      repair  . Carpal tunnel release Right 2007  . Rotator cuff repair Right 2008  . Sigmoidoscopy  08/26/2001  . Pacemaker generator change  10/29/12    Dr. Johney Frame  . Pacemaker generator change N/A 10/29/2012    Procedure: PACEMAKER GENERATOR CHANGE;  Surgeon: Hillis Range, MD;  Location: [facility] CATH LAB;  Service: Cardiovascular;  Laterality: N/A;    Allergies  Allergen Reactions  . Celebrex [Celecoxib]     Lips swelled  . Sulfur     Lips swelled  . Vioxx [Rofecoxib] Other (See Comments)    unknown      Medication List       This list is accurate as of: 06/25/15  5:41 PM.  Always use your most recent med list.               acetaminophen 650 MG CR tablet  Commonly known as:  TYLENOL  Take 650 mg by mouth 2 (two) times daily as needed. For pain     amoxicillin 500 MG capsule    Commonly known as:  AMOXIL  Take 2,000 mg by mouth daily as needed (prior to surgery).     aspirin 81 MG tablet  Take 81 mg by mouth daily.     bimatoprost 0.01 % Soln  Commonly known as:  LUMIGAN  Place 1 drop into both eyes at bedtime.     carbamide peroxide 6.5 % otic solution  Commonly known as:  DEBROX  Instill 5 drops into affected ear at bedtime for three nights as needed for ear wax     finasteride 5 MG tablet  Commonly known as:  PROSCAR  Take one tablet by mouth once daily     fluticasone 50 MCG/ACT nasal spray  Commonly known as:  FLONASE  Place 1 spray into both nostrils 2 (two) times daily.  furosemide 20 MG tablet  Commonly known as:  LASIX  Take one tablet by mouth once daily. One twice weekly as needed for increased weight 2-3 lbs     guaiFENesin 600 MG 12 hr tablet  Commonly known as:  MUCINEX  Take 600 mg by mouth 2 (two) times daily.     HYDROcodone-acetaminophen 5-325 MG tablet  Commonly known as:  NORCO/VICODIN  Take 1 tablet by mouth every 8 (eight) hours as needed (pain).     ICAPS PO  Take by mouth. Take one daily     KLOR-CON M10 10 MEQ tablet  Generic drug:  potassium chloride  Take 1 tablet by mouth twice weekly as needed with Lasix     lisinopril 20 MG tablet  Commonly known as:  PRINIVIL,ZESTRIL  Take one tablet twice daily for blood pressure     loratadine 10 MG tablet  Commonly known as:  CLARITIN  Take 10 mg by mouth daily.     methadone 5 MG tablet  Commonly known as:  DOLOPHINE  Take 1 tablet every 12 hours to relieve pain     metoprolol tartrate 25 MG tablet  Commonly known as:  LOPRESSOR  One twice daily to control blood pressure     naproxen sodium 220 MG tablet  Commonly known as:  ANAPROX  Take 220 mg by mouth 2 (two) times daily with a meal.     nitroGLYCERIN 0.4 MG SL tablet  Commonly known as:  NITROSTAT  Place 0.4 mg under the tongue every 5 (five) minutes as needed. For chest pain     omeprazole 20 MG  capsule  Commonly known as:  PRILOSEC  Take 20 mg by mouth daily.     Polyethyl Glycol-Propyl Glycol 0.4-0.3 % Soln  Apply to eye. One drop both eyes twice daily     polyethylene glycol packet  Commonly known as:  MIRALAX / GLYCOLAX  Take 17 g by mouth daily. Mix with 8 ounces of water or juice and take by mouth every other day for constipation.     REFRESH PLUS 0.5 % Soln  Generic drug:  carboxymethylcellulose  1 drop. Both eyes four times daily as needed for dry eyes     SALONPAS EX  Apply topically. Apply patch daily to lower back     sertraline 50 MG tablet  Commonly known as:  ZOLOFT  One daily to help depression     tamsulosin 0.4 MG Caps capsule  Commonly known as:  FLOMAX  Take 0.4 mg by mouth daily.     zolpidem 5 MG tablet  Commonly known as:  AMBIEN  Take one tablet by mouth at bedtime for rest        Review of Systems  Constitutional: Negative.  Negative for activity change, appetite change, fatigue and unexpected weight change.  HENT: Negative.  Negative for hearing loss, sinus pressure and sore throat.   Eyes:       Hx macular degeneration. Receiving injections every 6-8 weeks.  Respiratory: Negative for apnea, cough, chest tightness, shortness of breath, wheezing and stridor.   Cardiovascular: Positive for leg swelling. Negative for chest pain and palpitations.  Gastrointestinal: Negative for nausea, vomiting, abdominal pain and abdominal distention.       Flatulence  Endocrine: Negative.   Genitourinary: Negative for urgency, frequency and decreased urine volume.       BPH.  Musculoskeletal: Positive for back pain and gait problem (uunstable. uses walker). Negative for neck pain and neck stiffness.  Reviewed x-ray reports from 2009 and recent CT of the lumbar spine.. Patient has foraminal stenoses and  spinal stenosis now. There is multiple intervertebral degenerative disc disease. He has had steroid injections at [facility], Dr.  Naaman Plummer. Some pain radiates down the right leg. He is now comfortable moving around using his 4 wheel walker. He is able to get in bed and lay down comfortably overnight.  Skin:       Dry. Itching on dorsum of hands.  Allergic/Immunologic: Negative.   Neurological: Negative.   Hematological: Negative.   Psychiatric/Behavioral: Positive for sleep disturbance (iinsomnia. Ambien helps.).    Immunization History  Administered Date(s) Administered  . Influenza Whole 02/02/2013  . Influenza-Unspecified 01/29/2014, 01/28/2015  . PPD Test 05/09/2012  . Pneumococcal Polysaccharide-23 04/24/2009  . Tdap 04/21/2012  . Zoster 04/25/2007   Pertinent  Health Maintenance Due  Topic Date Due  . PNA vac Low Risk Adult (2 of 2 - PCV13) 04/24/2010  . INFLUENZA VACCINE  11/23/2015   Fall Risk  12/15/2014 08/04/2014  Falls in the past year? No No  Risk for fall due to : - History of fall(s);Impaired balance/gait   Functional Status Survey:    Filed Vitals:   06/25/15 1051  BP: 117/65  Pulse: 61  Temp: 96.3 F (35.7 C)  TempSrc: Oral  Resp: 18  Height:  (1.727 m)  Weight: 186 lb 3.2 oz (84.46 kg)   Body mass index is 28.32 kg/(m^2). Physical Exam  Constitutional: He is oriented to person, place, and time. He appears well-nourished. No distress.  Frail  HENT:  Head: Normocephalic and atraumatic.  Right Ear: External ear normal.  Left Ear: External ear normal.  Nose: Nose normal.  Mouth/Throat: Oropharynx is clear and moist.  Mild decrease in hearing  Eyes: Conjunctivae and EOM are normal. Pupils are equal, round, and reactive to light.  Neck: No JVD present. No tracheal deviation present. No thyromegaly present.  Cardiovascular: Normal rate, regular rhythm and intact distal pulses.  Exam reveals friction rub. Exam reveals no gallop.   No murmur heard. Pulmonary/Chest: Effort normal. No respiratory distress. He has no wheezes. He has rales (Right chest).  Abdominal: He  exhibits no distension and no mass. There is no tenderness.  Large upper diastasis recti  Musculoskeletal: Normal range of motion. He exhibits edema (1+ bipedal). He exhibits no tenderness.  Using walker. Bilateral knee prosthesis. Tender lumbar spine and shading towards the left side of the small of the back. BLE trace edema  Lymphadenopathy:    He has no cervical adenopathy.  Neurological: He is alert and oriented to person, place, and time. No cranial nerve deficit. Coordination normal.  Diminished sensation to vibration in the right foot.  Skin: Skin is dry. No rash noted. No erythema. No pallor.  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 Multiple SK.   Psychiatric: He has a normal mood and affect. His behavior is normal. Judgment and thought content normal.    Labs reviewed:  Recent Labs  10/19/14  NA 135*  K 4.4  BUN 32*  CREATININE 1.2    Recent Labs  10/19/14  AST 23  ALT 13  ALKPHOS 56    Recent Labs  10/19/14  WBC 8.1  HGB 12.9*  HCT 37*  PLT 191   Lab Results  Component Value Date   TSH 1.10 10/19/2014   Lab Results  Component Value Date   HGBA1C 6.1*  04/22/2012   Lab Results  Component Value Date   CHOL 149 04/22/2012   HDL 35* 04/22/2012   LDLCALC 96 04/22/2012   TRIG 89 04/22/2012   CHOLHDL 4.3 04/22/2012    Significant Diagnostic Results in last 30 days:  No results found.  Assessment/Plan  Depression Mood is stable, continue Sertraline 50mg  daily.   Essential hypertension Controlled, takes Lisinopril 20mg , Metoprolol 25mg , Furosemide 20mg  daily.   Edema trace in BLE, continue Furosemide 20mg  daily.   BPH (benign prostatic hyperplasia) Continue Tamsulosin 0.4mg  and Finasteride 5mg  daily. Stable.   Constipation Stable, takes MiraLax daily.   GERD (gastroesophageal reflux disease) Stable, continue Omeprazole 20mg  daily.  Insomnia Sleeps well, continue Ambien 5mg  nightly.    Spinal stenosis of lumbar region 01/06/14 X-ray Lumbar spine: no acute osseous or soft tissue abnormality. Multilevel disc degenerative changes on the lumbar spine likely neural foraminal narrowing at L5-S1 and possible the other levels.  10/16/14 dc Tramadol-not adequate, start Norco 5/325 1/2 q8h, Salonpas OTC, the patient stated he only tolerate bedrest 4-5hours then he sits in the chair which may contribute to the left buttock pressure injury 05/25/15 pain is controlled, continue Methadone 5mg  bid, prn Norco q8, Naproxen 220mg  bid.  Lower back pain, no change in pain management. Continue Salonpas, may increase to 2    Family/ staff Communication: continue AL care needs  Labs/tests ordered:  none

## 2015-06-25 NOTE — Assessment & Plan Note (Signed)
Stable,  takes MiraLax daily.  

## 2015-06-25 NOTE — Assessment & Plan Note (Signed)
Controlled, takes Lisinopril 20mg , Metoprolol 25mg , Furosemide 20mg  daily.

## 2015-06-25 NOTE — Assessment & Plan Note (Signed)
Continue Tamsulosin 0.4mg and Finasteride 5mg daily. Stable.  

## 2015-06-25 NOTE — Assessment & Plan Note (Signed)
Stable, continue Omeprazole 20mg daily.  

## 2015-06-25 NOTE — Assessment & Plan Note (Signed)
Mood is stable, continue Sertraline 50mg daily.  

## 2015-06-25 NOTE — Assessment & Plan Note (Signed)
trace in BLE, continue Furosemide 20mg daily. 

## 2015-06-25 NOTE — Assessment & Plan Note (Addendum)
01/06/14 X-ray Lumbar spine: no acute osseous or soft tissue abnormality. Multilevel disc degenerative changes on the lumbar spine likely neural foraminal narrowing at L5-S1 and possible the other levels.  10/16/14 dc Tramadol-not adequate, start Norco 5/325 1/2 q8h, Salonpas OTC, the patient stated he only tolerate bedrest 4-5hours then he sits in the chair which may contribute to the left buttock pressure injury 05/25/15 pain is controlled, continue Methadone 5mg  bid, prn Norco q8, Naproxen 220mg  bid.  Lower back pain, no change in pain management. Continue Salonpas, may increase to 2

## 2015-07-05 ENCOUNTER — Encounter: Payer: Medicare Other | Admitting: *Deleted

## 2015-07-05 ENCOUNTER — Telehealth: Payer: Self-pay | Admitting: Cardiology

## 2015-07-05 NOTE — Telephone Encounter (Signed)
Confirmed remote transmission w/ pt nurse.   

## 2015-07-08 DIAGNOSIS — H35053 Retinal neovascularization, unspecified, bilateral: Secondary | ICD-10-CM | POA: Diagnosis not present

## 2015-07-08 DIAGNOSIS — H353211 Exudative age-related macular degeneration, right eye, with active choroidal neovascularization: Secondary | ICD-10-CM | POA: Diagnosis not present

## 2015-07-08 DIAGNOSIS — H35351 Cystoid macular degeneration, right eye: Secondary | ICD-10-CM | POA: Diagnosis not present

## 2015-07-08 DIAGNOSIS — H35352 Cystoid macular degeneration, left eye: Secondary | ICD-10-CM | POA: Diagnosis not present

## 2015-07-09 ENCOUNTER — Encounter: Payer: Self-pay | Admitting: Cardiology

## 2015-07-13 ENCOUNTER — Encounter: Payer: Self-pay | Admitting: Internal Medicine

## 2015-07-13 ENCOUNTER — Non-Acute Institutional Stay: Payer: Medicare Other | Admitting: Internal Medicine

## 2015-07-13 VITALS — BP 120/60 | HR 67 | Temp 98.0°F | Resp 20 | Ht 68.0 in | Wt 188.2 lb

## 2015-07-13 DIAGNOSIS — R609 Edema, unspecified: Secondary | ICD-10-CM

## 2015-07-13 DIAGNOSIS — M79673 Pain in unspecified foot: Secondary | ICD-10-CM | POA: Insufficient documentation

## 2015-07-13 DIAGNOSIS — I10 Essential (primary) hypertension: Secondary | ICD-10-CM | POA: Diagnosis not present

## 2015-07-13 DIAGNOSIS — I509 Heart failure, unspecified: Secondary | ICD-10-CM

## 2015-07-13 DIAGNOSIS — M4806 Spinal stenosis, lumbar region: Secondary | ICD-10-CM | POA: Diagnosis not present

## 2015-07-13 DIAGNOSIS — M48061 Spinal stenosis, lumbar region without neurogenic claudication: Secondary | ICD-10-CM

## 2015-07-13 DIAGNOSIS — M79672 Pain in left foot: Secondary | ICD-10-CM | POA: Diagnosis not present

## 2015-07-13 MED ORDER — LIDOCAINE 4 % EX CREA: TOPICAL_CREAM | CUTANEOUS | Status: DC

## 2015-07-13 MED ORDER — GOLD BOND ULTIMATE HEALING EX LOTN: 5.0000 mL | TOPICAL_LOTION | Freq: Two times a day (BID) | CUTANEOUS | Status: DC

## 2015-07-13 NOTE — Addendum Note (Signed)
Addended by: Kimber RelicGREEN, Riyan G on: 07/13/2015 10:20 AM   Modules accepted: Level of Service

## 2015-07-13 NOTE — Progress Notes (Signed)
Patient ID: Caleb Nash, male   DOB: February 13, 1914, 82 y.o.   MRN: [id number]   Location:  [facility] Room Number: AL04 Place of Service:  [phone])  Provider: Murray Hodgkins, M.D. on 4 a day her 70:60 on  Code Status: full Goals of Care:  Advanced Directives 07/13/2015  Does patient have an advance directive? Yes  Type of Advance Directive Healthcare Power of Attorney  Does patient want to make changes to advanced directive? No - Patient declined  Copy of advanced directive(s) in chart? Yes     Chief Complaint  Patient presents with  . Medical Management of Chronic Issues    4 mo f/u- HTN, left heel pain during the night    HPI: Patient is a 82 y.o. male seen today for medical management of chronic diseases.    Patient has a new complaint of pain at the left heel. It usually occurs about 4 AM. He rubs atenolol bit of it seems to get better. It is interrupting his sleep.  Back pains are not doing as well as on the last visit. The lidocaine patches that he is currently using seem to irritate his skin and he has to leave them off for a couple days because of intense pruritus at the site of patch.  Dyspnea is doing okay and congestive heart failure seems compensated.  Blood pressure is controlled.  Edema has improved with regular use of compression stockings   Past Medical History  Diagnosis Date  . Osteoarthritis   . HTN (hypertension)   . BPH (benign prostatic hyperplasia)   . Sick sinus syndrome (HCC) 10/14/2003; 10/29/2012    MDT EnPulse implanted by Dr Amil Amen for SSS; generator change 10/29/2012 by Dr Johney Frame MDT [employer]ana [employer] pacemaker  . CAD (coronary artery disease)     s/p CABG 2005  . Macular degeneration   . Hyperlipidemia   . Depression     mild per pt  . Sporotrichosis     remote  . History of rhabdomyolysis   . Ingrowing nail 06/11/2012  . Abnormality of gait 06/11/2012  . Major depressive disorder, single episode, unspecified (HCC) 04/28/2012  .  Unspecified glaucoma 04/28/2012  . Coronary atherosclerosis of native coronary artery 04/28/2012  . Atrial fibrillation (HCC) 04/28/2012  . Congestive heart failure, unspecified 04/28/2012  . Reflux esophagitis 04/28/2012  . Hypertrophy of prostate without urinary obstruction and other lower urinary tract symptoms (LUTS) 04/28/2012  . Insomnia, unspecified 04/28/2012  . Edema 04/28/2012  . Allergy, unspecified not elsewhere classified 04/28/2012  . Personal history of fall 04/28/2012  . Cardiac pacemaker in situ 04/28/2012  . Other specified disease of white blood cells 04/26/2012  . Alcohol abuse 04/26/2012  . Chronic airway obstruction, not elsewhere classified 04/26/2012  . Muscle weakness (generalized) 04/26/2012  . Rhabdomyolysis   . CHF (congestive heart failure) (HCC) 04/23/2012  . Degenerative joint disease 04/21/2012  . Essential hypertension 10/17/2012  . Fall 04/21/2012    Prolonged downtime 04/20/2012   . GERD (gastroesophageal reflux disease) 09/13/2012  . Hip pain, right 09/02/2013  . Insomnia 11/15/2012  . NSTEMI (non-ST elevated myocardial infarction) (HCC) 04/23/2012    Related to the stress of rhabdomyolysis and prolonged downtime prior to admission in a patient with known coronary artery disease   . Pacemaker 04/21/2012    Near EOL.   . Paroxysmal atrial tachycardia (HCC) 04/21/2012    Brief episodes. Not felt to be an anticoagulation candidate because of age and frailty   .  Pericarditis 04/21/2012  . Seborrheic eczema 12/03/2012  . Tachy-brady syndrome (HCC) 04/21/2012    Pacemaker is near EOL   . Unspecified constipation 09/13/2012  . Lumbago   . Synovial cyst of wrist 11/10/2014    Left wrist      Past Surgical History  Procedure Laterality Date  . Pacemaker insertion  10/14/2003;10/29/2012    MDT Implanted by Dr Amil Amen for SSS; generator change 10/29/2012 by Dr Johney Frame [employer] [place]  . Coronary artery bypass graft  2005  . Bilateral knee arthroplasty  09/28/83; 05/17/1992    right then  left  . Appendectomy    . Yag laser application Right 12/26/1973    eye  . Hernia repair Right 05/19/1976  . Transurethral resection of prostate  07/27/1983  . Cystoscopy  08/14/85  . Epididymis surgery  09/17/1985  . Angioplasty  9/11/190    Dr. Katrinka Blazing  . Replacement total knee bilateral  1992 and 1996  . Cataract extraction Right 10/30/1996  . Shoulder open rotator cuff repair Left 02/07/1996  . Back surgery  03/11/2001  . Yag laser application Right 04/30/2001  . Yag laser application Left 05/07/2001  . Toe surgery Right 2005    correction  . Carpal tunnel release Left   . Elbow surgery      repair  . Carpal tunnel release Right 2007  . Rotator cuff repair Right 2008  . Sigmoidoscopy  08/26/2001  . Pacemaker generator change  10/29/12    Dr. Johney Frame  . Pacemaker generator change N/A 10/29/2012    Procedure: PACEMAKER GENERATOR CHANGE;  Surgeon: Hillis Range, MD;  Location: [facility] CATH LAB;  Service: Cardiovascular;  Laterality: N/A;    Allergies  Allergen Reactions  . Celebrex [Celecoxib]     Lips swelled  . Sulfur     Lips swelled  . Vioxx [Rofecoxib] Other (See Comments)    unknown      Medication List       This list is accurate as of: 07/13/15  9:20 AM.  Always use your most recent med list.               acetaminophen 650 MG CR tablet  Commonly known as:  TYLENOL  Take 650 mg by mouth 2 (two) times daily as needed. For pain     amoxicillin 500 MG capsule  Commonly known as:  AMOXIL  Take 2,000 mg by mouth daily as needed (prior to surgery). Reported on 07/13/2015     aspirin 81 MG tablet  Take 81 mg by mouth daily.     bimatoprost 0.01 % Soln  Commonly known as:  LUMIGAN  Place 1 drop into both eyes at bedtime.     carbamide peroxide 6.5 % otic solution  Commonly known as:  DEBROX  Reported on 07/13/2015     diclofenac sodium 1 % Gel  Commonly known as:  VOLTAREN     finasteride 5 MG tablet  Commonly known as:  PROSCAR  Take one tablet by mouth once daily      fluticasone 50 MCG/ACT nasal spray  Commonly known as:  FLONASE  Place 1 spray into both nostrils 2 (two) times daily.     furosemide 20 MG tablet  Commonly known as:  LASIX  Take one tablet by mouth once daily. One twice weekly as needed for increased weight 2-3 lbs     guaiFENesin 600 MG 12 hr tablet  Commonly known as:  MUCINEX  Take 600 mg by mouth 2 (two) times  daily.     HYDROcodone-acetaminophen 5-325 MG tablet  Commonly known as:  NORCO/VICODIN  Take 1 tablet by mouth every 8 (eight) hours as needed (pain).     ICAPS PO  Take by mouth. Take one daily     KLOR-CON M10 10 MEQ tablet  Generic drug:  potassium chloride  Take 1 tablet by mouth twice weekly as needed with Lasix     lisinopril 20 MG tablet  Commonly known as:  PRINIVIL,ZESTRIL  Take one tablet twice daily for blood pressure     loratadine 10 MG tablet  Commonly known as:  CLARITIN  Take 10 mg by mouth daily. Reported on 07/13/2015     methadone 5 MG tablet  Commonly known as:  DOLOPHINE  Take 1 tablet every 12 hours to relieve pain     metoprolol tartrate 25 MG tablet  Commonly known as:  LOPRESSOR  One twice daily to control blood pressure     naproxen sodium 220 MG tablet  Commonly known as:  ANAPROX  Take 220 mg by mouth 2 (two) times daily with a meal.     nitroGLYCERIN 0.4 MG SL tablet  Commonly known as:  NITROSTAT  Place 0.4 mg under the tongue every 5 (five) minutes as needed. Reported on 07/13/2015     omeprazole 20 MG capsule  Commonly known as:  PRILOSEC  Take 20 mg by mouth daily.     Polyethyl Glycol-Propyl Glycol 0.4-0.3 % Soln  Apply to eye. One drop both eyes twice daily     polyethylene glycol packet  Commonly known as:  MIRALAX / GLYCOLAX  Take 17 g by mouth daily. Mix with 8 ounces of water or juice and take by mouth every other day for constipation.     REFRESH PLUS 0.5 % Soln  Generic drug:  carboxymethylcellulose  1 drop. Both eyes four times daily as needed for dry  eyes     SALONPAS EX  Apply topically. Apply patch daily to lower back     sertraline 50 MG tablet  Commonly known as:  ZOLOFT  One daily to help depression     tamsulosin 0.4 MG Caps capsule  Commonly known as:  FLOMAX  Take 0.4 mg by mouth daily.     zolpidem 5 MG tablet  Commonly known as:  AMBIEN  Take one tablet by mouth at bedtime for rest        Review of Systems:  Review of Systems  Constitutional: Negative.  Negative for activity change, appetite change, fatigue and unexpected weight change.  HENT: Negative.  Negative for hearing loss, sinus pressure and sore throat.   Eyes:       Hx macular degeneration. Receiving injections every 6-8 weeks.  Respiratory: Positive for cough. Negative for apnea, chest tightness, shortness of breath, wheezing and stridor.   Cardiovascular: Positive for leg swelling. Negative for chest pain and palpitations.  Gastrointestinal: Negative for nausea, vomiting, abdominal pain and abdominal distention.       Flatulence  Endocrine: Negative.   Genitourinary: Negative for urgency, frequency and decreased urine volume.       BPH.  Musculoskeletal: Positive for back pain and gait problem (uunstable. uses walker). Negative for neck pain and neck stiffness.       Reviewed x-ray reports from 2009 and recent CT of the lumbar spine.. Patient has foraminal stenoses and  spinal stenosis now. There is multiple intervertebral degenerative disc disease. He has had steroid injections at [facility], Dr.  Naaman Plummer. Some pain radiates down the right leg. He is now comfortable moving around using his 4 wheel walker. He is able to get in bed and lay down comfortably overnight.  Skin:       Dry. Itching on dorsum of hands.  Allergic/Immunologic: Negative.   Neurological: Negative.   Hematological: Negative.   Psychiatric/Behavioral: Positive for sleep disturbance (insomnia. Ambien helps.).    Health Maintenance  Topic Date Due  . PNA  vac Low Risk Adult (2 of 2 - PCV13) 04/24/2010  . INFLUENZA VACCINE  11/23/2015  . TETANUS/TDAP  04/21/2022  . ZOSTAVAX  Completed    Physical Exam: Filed Vitals:   07/13/15 0843  BP: 120/60  Pulse: 67  Temp: 98 F (36.7 C)  TempSrc: Oral  Resp: 20  Height:  (1.727 m)  Weight: 188 lb 3.2 oz (85.367 kg)  SpO2: 99%   Body mass index is 28.62 kg/(m^2). Physical Exam  Constitutional: He is oriented to person, place, and time. He appears well-nourished. No distress.  Frail  HENT:  Head: Normocephalic and atraumatic.  Right Ear: External ear normal.  Left Ear: External ear normal.  Nose: Nose normal.  Mouth/Throat: Oropharynx is clear and moist.  Mild decrease in hearing  Eyes: Conjunctivae and EOM are normal. Pupils are equal, round, and reactive to light.  Neck: No JVD present. No tracheal deviation present. No thyromegaly present.  Cardiovascular: Normal rate, regular rhythm and intact distal pulses.  Exam reveals friction rub. Exam reveals no gallop.   No murmur heard. Pulmonary/Chest: Effort normal. No respiratory distress. He has no wheezes. He has rales (Right chest).  Abdominal: He exhibits no distension and no mass. There is no tenderness.  Large upper diastasis recti  Musculoskeletal: Normal range of motion. He exhibits edema (1+ bipedal). He exhibits no tenderness.  Using walker. Bilateral knee prosthesis. Tender lumbar spine and shading towards the left side of the small of the back. BLE trace edema  Lymphadenopathy:    He has no cervical adenopathy.  Neurological: He is alert and oriented to person, place, and time. No cranial nerve deficit. Coordination normal.  Diminished sensation to vibration in the right foot.  Skin: Skin is dry. No rash noted. No erythema. No pallor.  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 Multiple SK.   Psychiatric: He has a normal mood and affect. His behavior is normal. Judgment and  thought content normal.    Labs reviewed: Basic Metabolic Panel:  Recent Labs  16/10/96  NA 135*  K 4.4  BUN 32*  CREATININE 1.2  TSH 1.10   Liver Function Tests:  Recent Labs  10/19/14  AST 23  ALT 13  ALKPHOS 56   No results for input(s): LIPASE, AMYLASE in the last 8760 hours. No results for input(s): AMMONIA in the last 8760 hours. CBC:  Recent Labs  10/19/14  WBC 8.1  HGB 12.9*  HCT 37*  PLT 191   Lipid Panel: No results for input(s): CHOL, HDL, LDLCALC, TRIG, CHOLHDL, LDLDIRECT in the last 8760 hours. Lab Results  Component Value Date   HGBA1C 6.1* 04/22/2012     Assessment/Plan   1. Heel pain, left - Emollient (GOLD BOND ULTIMATE HEALING) LOTN; Apply 5 mLs topically 2 (two) times daily.  Dispense: 396 g; Refill: 6  2. Spinal stenosis of lumbar region - lidocaine (ASPERCREME W/LIDOCAINE) 4 % cream; Apply morning and evening to painful area of the lower back  Dispense: 120 g; Refill: 5  3. Chronic congestive heart failure, unspecified congestive heart failure type (HCC) Compensated  4. Edema, unspecified type Improved  5. Essential hypertension Controlled

## 2015-07-15 ENCOUNTER — Ambulatory Visit (INDEPENDENT_AMBULATORY_CARE_PROVIDER_SITE_OTHER): Payer: Medicare Other | Admitting: *Deleted

## 2015-07-15 ENCOUNTER — Telehealth: Payer: Self-pay | Admitting: Internal Medicine

## 2015-07-15 DIAGNOSIS — I495 Sick sinus syndrome: Secondary | ICD-10-CM | POA: Diagnosis not present

## 2015-07-15 NOTE — Telephone Encounter (Signed)
New message      Calling to let us know he is sending in another remote transmission

## 2015-07-16 NOTE — Telephone Encounter (Signed)
Spoke w/ pt nurse and informed her that we did receive pt remote transmission. Pt nurse verbalized understanding.

## 2015-07-16 NOTE — Progress Notes (Signed)
Remote pacemaker transmission.   

## 2015-08-13 ENCOUNTER — Encounter: Payer: Self-pay | Admitting: Cardiology

## 2015-08-13 LAB — CUP PACEART REMOTE DEVICE CHECK
Battery Voltage: 2.79 V
Brady Statistic AP VS Percent: 0 %
Brady Statistic AS VP Percent: 1 %
Brady Statistic AS VS Percent: 0 %
Implantable Lead Implant Date: 20050622
Implantable Lead Location: 753860
Lead Channel Impedance Value: 708 Ohm
Lead Channel Pacing Threshold Amplitude: 0.5 V
Lead Channel Pacing Threshold Amplitude: 0.5 V
Lead Channel Pacing Threshold Pulse Width: 0.4 ms
Lead Channel Setting Pacing Pulse Width: 0.4 ms
MDC IDC LEAD IMPLANT DT: 20050622
MDC IDC LEAD LOCATION: 753859
MDC IDC MSMT BATTERY IMPEDANCE: 280 Ohm
MDC IDC MSMT BATTERY REMAINING LONGEVITY: 89 mo
MDC IDC MSMT LEADCHNL RA IMPEDANCE VALUE: 459 Ohm
MDC IDC MSMT LEADCHNL RA PACING THRESHOLD PULSEWIDTH: 0.4 ms
MDC IDC SESS DTM: 20170323163119
MDC IDC SET LEADCHNL RA PACING AMPLITUDE: 2 V
MDC IDC SET LEADCHNL RV PACING AMPLITUDE: 2.5 V
MDC IDC SET LEADCHNL RV SENSING SENSITIVITY: 4 mV
MDC IDC STAT BRADY AP VP PERCENT: 98 %

## 2015-08-20 ENCOUNTER — Other Ambulatory Visit: Payer: Self-pay

## 2015-08-20 DIAGNOSIS — M5440 Lumbago with sciatica, unspecified side: Secondary | ICD-10-CM

## 2015-08-20 MED ORDER — METHADONE HCL 5 MG PO TABS: ORAL_TABLET | ORAL | Status: DC

## 2015-08-20 NOTE — Telephone Encounter (Signed)
Rx sent to [facility] Phone: [phone] Fax: [phone]

## 2015-08-24 ENCOUNTER — Encounter: Payer: Self-pay | Admitting: Internal Medicine

## 2015-09-14 ENCOUNTER — Encounter: Payer: Self-pay | Admitting: Nurse Practitioner

## 2015-09-14 ENCOUNTER — Non-Acute Institutional Stay: Payer: Medicare Other | Admitting: Nurse Practitioner

## 2015-09-14 DIAGNOSIS — K219 Gastro-esophageal reflux disease without esophagitis: Secondary | ICD-10-CM

## 2015-09-14 DIAGNOSIS — K5900 Constipation, unspecified: Secondary | ICD-10-CM | POA: Diagnosis not present

## 2015-09-14 DIAGNOSIS — N400 Enlarged prostate without lower urinary tract symptoms: Secondary | ICD-10-CM | POA: Diagnosis not present

## 2015-09-14 DIAGNOSIS — G4700 Insomnia, unspecified: Secondary | ICD-10-CM

## 2015-09-14 DIAGNOSIS — I10 Essential (primary) hypertension: Secondary | ICD-10-CM

## 2015-09-14 DIAGNOSIS — R609 Edema, unspecified: Secondary | ICD-10-CM | POA: Diagnosis not present

## 2015-09-14 DIAGNOSIS — F329 Major depressive disorder, single episode, unspecified: Secondary | ICD-10-CM

## 2015-09-14 DIAGNOSIS — M4806 Spinal stenosis, lumbar region: Secondary | ICD-10-CM

## 2015-09-14 DIAGNOSIS — F32A Depression, unspecified: Secondary | ICD-10-CM

## 2015-09-14 DIAGNOSIS — M48061 Spinal stenosis, lumbar region without neurogenic claudication: Secondary | ICD-10-CM

## 2015-09-14 DIAGNOSIS — J069 Acute upper respiratory infection, unspecified: Secondary | ICD-10-CM | POA: Diagnosis not present

## 2015-09-14 NOTE — Progress Notes (Signed)
Patient ID: Caleb Nash, male   DOB: 25-Jul-1913, 82 y.o.   MRN: [id number]  Location:  [facility] Room Number: AL 04 Place of Service:  AL [facility] Provider:  Arna Snipe Zakaria Fromer NP  Kimber Relic, MD  Patient Care Team: Kimber Relic, MD as PCP - General (Internal Medicine) [facility] Lakeishia Truluck X, NP as Nurse Practitioner (Nurse Practitioner) Lyn Records, MD as Consulting Physician (Cardiology) Barron Alvine, MD as Consulting Physician (Urology) Francisca December, MD as Attending Physician (Cardiology) Leta Speller, MD as Consulting Physician (Dermatology) Elise Benne, MD as Consulting Physician (Ophthalmology)  Extended Emergency Contact Information Primary Emergency Contact: Mortimore,Janet Address: [address]          [place], [place] [address] [place] of [place] Home Phone: [phone] Mobile Phone: [phone] Relation: Daughter  Code Status:  DNR Goals of care: Advanced Directive information Advanced Directives 09/14/2015  Does patient have an advance directive? Yes  Type of Estate agent of [place];Out of facility DNR (pink MOST or yellow form)  Does patient want to make changes to advanced directive? No - Patient declined  Copy of advanced directive(s) in chart? Yes     Chief Complaint  Patient presents with  . Acute Visit    cough and congestion    HPI:  Pt is a 82 y.o. male seen today for medical management of chronic diseases.  HTN, controlled on Lisinopril, Metoprolol, and Furosemide, trace edema in ankles has no change, sleeps with aid of Ambien, his mood is table while taking Sertraline 50mg , not constipation while on MiraLax daily. Hx of GERD controlled symptoms with Omeprazole. Chronic back pain is better managed with Methadone 5mg  bid, Norco prn, Naproxen 220mg  bid. He is stable in AL setting.   Congestion, cough new onset, afebrile, denied chest pain, no O2 desaturation.     Past Medical History    Diagnosis Date  . Osteoarthritis   . HTN (hypertension)   . BPH (benign prostatic hyperplasia)   . Sick sinus syndrome (HCC) 10/14/2003; 10/29/2012    MDT EnPulse implanted by Dr Amil Amen for SSS; generator change 10/29/2012 by Dr Johney Frame MDT Jana Half pacemaker  . CAD (coronary artery disease)     s/p CABG 2005  . Macular degeneration   . Hyperlipidemia   . Depression     mild per pt  . Sporotrichosis     remote  . History of rhabdomyolysis   . Ingrowing nail 06/11/2012  . Abnormality of gait 06/11/2012  . Major depressive disorder, single episode, unspecified (HCC) 04/28/2012  . Unspecified glaucoma 04/28/2012  . Coronary atherosclerosis of native coronary artery 04/28/2012  . Atrial fibrillation (HCC) 04/28/2012  . Congestive heart failure, unspecified 04/28/2012  . Reflux esophagitis 04/28/2012  . Hypertrophy of prostate without urinary obstruction and other lower urinary tract symptoms (LUTS) 04/28/2012  . Insomnia, unspecified 04/28/2012  . Edema 04/28/2012  . Allergy, unspecified not elsewhere classified 04/28/2012  . Personal history of fall 04/28/2012  . Cardiac pacemaker in situ 04/28/2012  . Other specified disease of white blood cells 04/26/2012  . Alcohol abuse 04/26/2012  . Chronic airway obstruction, not elsewhere classified 04/26/2012  . Muscle weakness (generalized) 04/26/2012  . Rhabdomyolysis   . CHF (congestive heart failure) (HCC) 04/23/2012  . Degenerative joint disease 04/21/2012  . Essential hypertension 10/17/2012  . Fall 04/21/2012    Prolonged downtime 04/20/2012   . GERD (gastroesophageal reflux disease) 09/13/2012  . Hip pain, right 09/02/2013  .  Insomnia 11/15/2012  . NSTEMI (non-ST elevated myocardial infarction) (HCC) 04/23/2012    Related to the stress of rhabdomyolysis and prolonged downtime prior to admission in a patient with known coronary artery disease   . Pacemaker 04/21/2012    Near EOL.   . Paroxysmal atrial tachycardia (HCC) 04/21/2012    Brief episodes. Not felt to be an  anticoagulation candidate because of age and frailty   . Pericarditis 04/21/2012  . Seborrheic eczema 12/03/2012  . Tachy-brady syndrome (HCC) 04/21/2012    Pacemaker is near EOL   . Unspecified constipation 09/13/2012  . Lumbago   . Synovial cyst of wrist 11/10/2014    Left wrist     Past Surgical History  Procedure Laterality Date  . Pacemaker insertion  10/14/2003;10/29/2012    MDT Implanted by Dr Amil Amen for SSS; generator change 10/29/2012 by Dr Johney Frame [employer] [place]  . Coronary artery bypass graft  2005  . Bilateral knee arthroplasty  09/28/83; 05/17/1992    right then left  . Appendectomy    . Yag laser application Right 12/26/1973    eye  . Hernia repair Right 05/19/1976  . Transurethral resection of prostate  07/27/1983  . Cystoscopy  08/14/85  . Epididymis surgery  09/17/1985  . Angioplasty  9/11/190    Dr. Katrinka Blazing  . Replacement total knee bilateral  1992 and 1996  . Cataract extraction Right 10/30/1996  . Shoulder open rotator cuff repair Left 02/07/1996  . Back surgery  03/11/2001  . Yag laser application Right 04/30/2001  . Yag laser application Left 05/07/2001  . Toe surgery Right 2005    correction  . Carpal tunnel release Left   . Elbow surgery      repair  . Carpal tunnel release Right 2007  . Rotator cuff repair Right 2008  . Sigmoidoscopy  08/26/2001  . Pacemaker generator change  10/29/12    Dr. Johney Frame  . Pacemaker generator change N/A 10/29/2012    Procedure: PACEMAKER GENERATOR CHANGE;  Surgeon: Hillis Range, MD;  Location: [facility] C[facility];  Service: Cardiovascular;  Laterality: N/A;    Allergies  Allergen Reactions  . Aleve [Naproxen Sodium]   . Celebrex [Celecoxib]     Lips swelled  . Sulfur     Lips swelled  . Vioxx [Rofecoxib] Other (See Comments)    unknown      Medication List       This list is accurate as of: 09/14/15  5:17 PM.  Always use your most recent med list.               acetaminophen 650 MG CR tablet  Commonly known as:  TYLENOL  Take 650 mg  by mouth 2 (two) times daily as needed. For pain     amoxicillin 500 MG capsule  Commonly known as:  AMOXIL  Take 2,000 mg by mouth daily as needed (prior to surgery). Reported on 09/14/2015     aspirin 81 MG tablet  Take 81 mg by mouth daily.     bimatoprost 0.01 % Soln  Commonly known as:  LUMIGAN  Place 1 drop into both eyes at bedtime.     carbamide peroxide 6.5 % otic solution  Commonly known as:  DEBROX  Reported on 07/13/2015     diclofenac sodium 1 % Gel  Commonly known as:  VOLTAREN     finasteride 5 MG tablet  Commonly known as:  PROSCAR  Take one tablet by mouth once daily     fluticasone 50  MCG/ACT nasal spray  Commonly known as:  FLONASE  Place 1 spray into both nostrils 2 (two) times daily.     furosemide 20 MG tablet  Commonly known as:  LASIX  Take one tablet by mouth once daily. One twice weekly as needed for increased weight 2-3 lbs     GOLD BOND ULTIMATE HEALING Lotn  Apply 5 mLs topically 2 (two) times daily.     guaiFENesin 600 MG 12 hr tablet  Commonly known as:  MUCINEX  Take 600 mg by mouth 2 (two) times daily.     HYDROcodone-acetaminophen 5-325 MG tablet  Commonly known as:  NORCO/VICODIN  Take 1 tablet by mouth every 8 (eight) hours as needed (pain).     ICAPS PO  Take by mouth. Take one daily     KLOR-CON M10 10 MEQ tablet  Generic drug:  potassium chloride  Take 1 tablet by mouth twice weekly as needed with Lasix     lidocaine 4 % cream  Commonly known as:  ASPERCREME W/LIDOCAINE  Apply morning and evening to painful area of the lower back     lisinopril 20 MG tablet  Commonly known as:  PRINIVIL,ZESTRIL  Take one tablet twice daily for blood pressure     loratadine 10 MG tablet  Commonly known as:  CLARITIN  Take 10 mg by mouth daily. Reported on 07/13/2015     methadone 5 MG tablet  Commonly known as:  DOLOPHINE  Take 1 tablet every 12 hours to relieve pain     metoprolol tartrate 25 MG tablet  Commonly known as:   LOPRESSOR  One twice daily to control blood pressure     naproxen sodium 220 MG tablet  Commonly known as:  ANAPROX  Take 220 mg by mouth 2 (two) times daily with a meal.     nitroGLYCERIN 0.4 MG SL tablet  Commonly known as:  NITROSTAT  Place 0.4 mg under the tongue every 5 (five) minutes as needed. Reported on 07/13/2015     omeprazole 20 MG capsule  Commonly known as:  PRILOSEC  Take 20 mg by mouth daily.     Polyethyl Glycol-Propyl Glycol 0.4-0.3 % Soln  Apply to eye. One drop both eyes twice daily     polyethylene glycol packet  Commonly known as:  MIRALAX / GLYCOLAX  Take 17 g by mouth daily. Mix with 8 ounces of water or juice and take by mouth every other day for constipation.     REFRESH PLUS 0.5 % Soln  Generic drug:  carboxymethylcellulose  1 drop. Both eyes four times daily as needed for dry eyes     sertraline 50 MG tablet  Commonly known as:  ZOLOFT  One daily to help depression     tamsulosin 0.4 MG Caps capsule  Commonly known as:  FLOMAX  Take 0.4 mg by mouth daily.     zolpidem 5 MG tablet  Commonly known as:  AMBIEN  Take one tablet by mouth at bedtime for rest        Review of Systems  Constitutional: Negative.  Negative for activity change, appetite change, fatigue and unexpected weight change.  HENT: Positive for congestion. Negative for hearing loss, sinus pressure and sore throat.   Eyes:       Hx macular degeneration. Receiving injections every 6-8 weeks.  Respiratory: Positive for cough. Negative for apnea, chest tightness, shortness of breath, wheezing and stridor.   Cardiovascular: Positive for leg swelling. Negative for chest pain  and palpitations.  Gastrointestinal: Negative for nausea, vomiting, abdominal pain and abdominal distention.       Flatulence  Endocrine: Negative.   Genitourinary: Negative for urgency, frequency and decreased urine volume.       BPH.  Musculoskeletal: Positive for back pain and gait problem (uunstable. uses  walker). Negative for neck pain and neck stiffness.       Reviewed x-ray reports from 2009 and recent CT of the lumbar spine.. Patient has foraminal stenoses and  spinal stenosis now. There is multiple intervertebral degenerative disc disease. He has had steroid injections at [facility], Dr. Naaman Plummer. Some pain radiates down the right leg. He is now comfortable moving around using his 4 wheel walker. He is able to get in bed and lay down comfortably overnight.  Skin:       Dry. Itching on dorsum of hands.  Allergic/Immunologic: Negative.   Neurological: Negative.   Hematological: Negative.   Psychiatric/Behavioral: Positive for sleep disturbance (iinsomnia. Ambien helps.).    Immunization History  Administered Date(s) Administered  . Influenza Whole 02/02/2013  . Influenza-Unspecified 01/29/2014, 01/28/2015  . PPD Test 05/09/2012  . Pneumococcal Polysaccharide-23 04/24/2009  . Tdap 04/21/2012  . Zoster 04/25/2007   Pertinent  Health Maintenance Due  Topic Date Due  . PNA vac Low Risk Adult (2 of 2 - PCV13) 04/24/2010  . INFLUENZA VACCINE  11/23/2015   Fall Risk  07/13/2015 12/15/2014 08/04/2014  Falls in the past year? No No No  Risk for fall due to : - - History of fall(s);Impaired balance/gait   Functional Status Survey:    Filed Vitals:   09/14/15 1226  BP: 115/56  Pulse: 63  Temp: 97.8 F (36.6 C)  TempSrc: Oral  Resp: 18  Height:  (1.727 m)  Weight: 186 lb 6.4 oz (84.55 kg)  SpO2: 93%   Body mass index is 28.35 kg/(m^2). Physical Exam  Constitutional: He is oriented to person, place, and time. He appears well-nourished. No distress.  Frail  HENT:  Head: Normocephalic and atraumatic.  Right Ear: External ear normal.  Left Ear: External ear normal.  Nose: Nose normal.  Mouth/Throat: Oropharynx is clear and moist.  Mild decrease in hearing  Eyes: Conjunctivae and EOM are normal. Pupils are equal, round, and reactive to light.  Neck:  No JVD present. No tracheal deviation present. No thyromegaly present.  Cardiovascular: Normal rate, regular rhythm and intact distal pulses.  Exam reveals friction rub. Exam reveals no gallop.   No murmur heard. Pulmonary/Chest: Effort normal. No respiratory distress. He has no wheezes. He has rales (Right chest).  Abdominal: He exhibits no distension and no mass. There is no tenderness.  Large upper diastasis recti  Musculoskeletal: Normal range of motion. He exhibits edema (1+ bipedal). He exhibits no tenderness.  Using walker. Bilateral knee prosthesis. Tender lumbar spine and shading towards the left side of the small of the back. BLE trace edema  Lymphadenopathy:    He has no cervical adenopathy.  Neurological: He is alert and oriented to person, place, and time. No cranial nerve deficit. Coordination normal.  Diminished sensation to vibration in the right foot.  Skin: Skin is dry. No rash noted. No erythema. No pallor.  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 Multiple SK.   Psychiatric: He has a normal mood and affect. His behavior is normal. Judgment and thought content normal.    Labs reviewed:  Recent Labs  10/19/14  NA 135*  K 4.4  BUN 32*  CREATININE 1.2    Recent Labs  10/19/14  AST 23  ALT 13  ALKPHOS 56    Recent Labs  10/19/14  WBC 8.1  HGB 12.9*  HCT 37*  PLT 191   Lab Results  Component Value Date   TSH 1.10 10/19/2014   Lab Results  Component Value Date   HGBA1C 6.1* 04/22/2012   Lab Results  Component Value Date   CHOL 149 04/22/2012   HDL 35* 04/22/2012   LDLCALC 96 04/22/2012   TRIG 89 04/22/2012   CHOLHDL 4.3 04/22/2012    Significant Diagnostic Results in last 30 days:  No results found.  Assessment/Plan  Spinal stenosis of lumbar region 01/06/14 X-ray Lumbar spine: no acute osseous or soft tissue abnormality. Multilevel disc degenerative changes on the lumbar spine likely neural  foraminal narrowing at L5-S1 and possible the other levels.  10/16/14 dc Tramadol-not adequate, start Norco 5/325 1/2 q8h, Salonpas OTC, the patient stated he only tolerate bedrest 4-5hours then he sits in the chair which may contribute to the left buttock pressure injury 05/25/15 pain is controlled, continue Methadone 5mg  bid, prn Norco q8, Naproxen 220mg  bid.  Lower back pain, no change in pain management. Continue Salonpas, may increase to 2   Insomnia Sleeps well, continue Ambien 5mg  nightly.    GERD (gastroesophageal reflux disease) Stable, continue Omeprazole 20mg  daily.   Essential hypertension Controlled, takes Lisinopril 20mg , Metoprolol 25mg , Furosemide 20mg  daily.    Edema trace in BLE, continue Furosemide 20mg  daily.    Depression Mood is stable, continue Sertraline 50mg  daily.    Constipation Stable, takes MiraLax daily.    BPH (benign prostatic hyperplasia) Continue Tamsulosin 0.4mg  and Finasteride 5mg  daily. Stable.    Acute upper respiratory infection Congestion, cough, afebrile, no O2 desaturation. Z-pk, Medrol dose pk. DuoNeb q8h x 72hrs     Family/ staff Communication: continue AL care needs  Labs/tests ordered:  none

## 2015-09-14 NOTE — Assessment & Plan Note (Signed)
Stable,  takes MiraLax daily.  

## 2015-09-14 NOTE — Assessment & Plan Note (Signed)
Stable, continue Omeprazole 20mg daily.  

## 2015-09-14 NOTE — Assessment & Plan Note (Signed)
Controlled, takes Lisinopril 20mg , Metoprolol 25mg , Furosemide 20mg  daily.

## 2015-09-14 NOTE — Assessment & Plan Note (Signed)
01/06/14 X-ray Lumbar spine: no acute osseous or soft tissue abnormality. Multilevel disc degenerative changes on the lumbar spine likely neural foraminal narrowing at L5-S1 and possible the other levels.  10/16/14 dc Tramadol-not adequate, start Norco 5/325 1/2 q8h, Salonpas OTC, the patient stated he only tolerate bedrest 4-5hours then he sits in the chair which may contribute to the left buttock pressure injury 05/25/15 pain is controlled, continue Methadone 5mg  bid, prn Norco q8, Naproxen 220mg  bid.  Lower back pain, no change in pain management. Continue Salonpas, may increase to 2

## 2015-09-14 NOTE — Assessment & Plan Note (Addendum)
Congestion, cough, afebrile, no O2 desaturation. Z-pk, Medrol dose pk. DuoNeb q8h x 72hrs

## 2015-09-14 NOTE — Assessment & Plan Note (Signed)
Mood is stable, continue Sertraline 50mg daily.  

## 2015-09-14 NOTE — Assessment & Plan Note (Signed)
Continue Tamsulosin 0.4mg and Finasteride 5mg daily. Stable.  

## 2015-09-14 NOTE — Assessment & Plan Note (Signed)
trace in BLE, continue Furosemide 20mg daily. 

## 2015-09-14 NOTE — Assessment & Plan Note (Signed)
Sleeps well, continue Ambien 5mg  nightly.

## 2015-09-27 ENCOUNTER — Other Ambulatory Visit: Payer: Self-pay | Admitting: *Deleted

## 2015-09-27 DIAGNOSIS — M5440 Lumbago with sciatica, unspecified side: Secondary | ICD-10-CM

## 2015-09-27 MED ORDER — METHADONE HCL 5 MG PO TABS: ORAL_TABLET | ORAL | Status: DC

## 2015-09-27 NOTE — Telephone Encounter (Signed)
[facility]

## 2015-10-05 ENCOUNTER — Encounter: Payer: Medicare Other | Admitting: Internal Medicine

## 2015-10-07 DIAGNOSIS — H353211 Exudative age-related macular degeneration, right eye, with active choroidal neovascularization: Secondary | ICD-10-CM | POA: Diagnosis not present

## 2015-10-12 ENCOUNTER — Encounter: Payer: Self-pay | Admitting: Internal Medicine

## 2015-10-14 DIAGNOSIS — H401131 Primary open-angle glaucoma, bilateral, mild stage: Secondary | ICD-10-CM | POA: Diagnosis not present

## 2015-10-14 DIAGNOSIS — H353123 Nonexudative age-related macular degeneration, left eye, advanced atrophic without subfoveal involvement: Secondary | ICD-10-CM | POA: Diagnosis not present

## 2015-10-15 ENCOUNTER — Ambulatory Visit (INDEPENDENT_AMBULATORY_CARE_PROVIDER_SITE_OTHER): Payer: Medicare Other | Admitting: *Deleted

## 2015-10-15 ENCOUNTER — Telehealth: Payer: Self-pay | Admitting: Cardiology

## 2015-10-15 DIAGNOSIS — I495 Sick sinus syndrome: Secondary | ICD-10-CM

## 2015-10-15 NOTE — Telephone Encounter (Signed)
Spoke with pt and reminded pt of remote transmission that is due today. Pt verbalized understanding.   

## 2015-10-15 NOTE — Progress Notes (Signed)
Remote pacemaker transmission.   

## 2015-10-16 LAB — CUP PACEART REMOTE DEVICE CHECK
Battery Voltage: 2.79 V
Brady Statistic AS VP Percent: 1 %
Implantable Lead Implant Date: 20050622
Implantable Lead Location: 753860
Lead Channel Pacing Threshold Amplitude: 0.5 V
Lead Channel Pacing Threshold Amplitude: 0.625 V
Lead Channel Pacing Threshold Pulse Width: 0.4 ms
Lead Channel Setting Pacing Amplitude: 2 V
Lead Channel Setting Pacing Pulse Width: 0.4 ms
Lead Channel Setting Sensing Sensitivity: 4 mV
MDC IDC LEAD IMPLANT DT: 20050622
MDC IDC LEAD LOCATION: 753859
MDC IDC MSMT BATTERY IMPEDANCE: 305 Ohm
MDC IDC MSMT BATTERY REMAINING LONGEVITY: 86 mo
MDC IDC MSMT LEADCHNL RA IMPEDANCE VALUE: 447 Ohm
MDC IDC MSMT LEADCHNL RA PACING THRESHOLD PULSEWIDTH: 0.4 ms
MDC IDC MSMT LEADCHNL RV IMPEDANCE VALUE: 691 Ohm
MDC IDC SESS DTM: 20170623131056
MDC IDC SET LEADCHNL RV PACING AMPLITUDE: 2.5 V
MDC IDC STAT BRADY AP VP PERCENT: 99 %
MDC IDC STAT BRADY AP VS PERCENT: 0 %
MDC IDC STAT BRADY AS VS PERCENT: 0 %

## 2015-10-18 ENCOUNTER — Encounter: Payer: Self-pay | Admitting: Cardiology

## 2015-10-22 ENCOUNTER — Other Ambulatory Visit: Payer: Self-pay | Admitting: *Deleted

## 2015-10-22 DIAGNOSIS — M5440 Lumbago with sciatica, unspecified side: Secondary | ICD-10-CM

## 2015-10-22 MED ORDER — METHADONE HCL 5 MG PO TABS: ORAL_TABLET | ORAL | Status: DC

## 2015-10-22 NOTE — Telephone Encounter (Signed)
[facility] Fax order 

## 2015-11-11 DIAGNOSIS — D649 Anemia, unspecified: Secondary | ICD-10-CM | POA: Diagnosis not present

## 2015-11-11 DIAGNOSIS — I10 Essential (primary) hypertension: Secondary | ICD-10-CM | POA: Diagnosis not present

## 2015-11-11 LAB — HEPATIC FUNCTION PANEL
ALT: 10 U/L (ref 10–40)
AST: 13 U/L — AB (ref 14–40)
Alkaline Phosphatase: 57 U/L (ref 25–125)
BILIRUBIN, TOTAL: 0.5 mg/dL

## 2015-11-11 LAB — BASIC METABOLIC PANEL
BUN: 36 mg/dL — AB (ref 4–21)
Creatinine: 1.4 mg/dL — AB (ref <=1.3)
Glucose: 106 mg/dL
Potassium: 4.9 mmol/L (ref 3.4–5.3)
Sodium: 136 mmol/L — AB (ref 137–147)

## 2015-11-11 LAB — CBC AND DIFFERENTIAL
HEMATOCRIT: 35 % — AB (ref 41–53)
Hemoglobin: 11.9 g/dL — AB (ref 13.5–17.5)
PLATELETS: 157 10*3/uL (ref 150–399)
WBC: 5.9 10^3/mL

## 2015-11-12 ENCOUNTER — Other Ambulatory Visit: Payer: Self-pay | Admitting: *Deleted

## 2015-12-14 ENCOUNTER — Encounter: Payer: Self-pay | Admitting: Internal Medicine

## 2015-12-14 ENCOUNTER — Non-Acute Institutional Stay: Payer: Medicare Other | Admitting: Internal Medicine

## 2015-12-14 VITALS — BP 120/62 | HR 65 | Temp 97.4°F | Ht 68.0 in | Wt 185.0 lb

## 2015-12-14 DIAGNOSIS — M79672 Pain in left foot: Secondary | ICD-10-CM | POA: Diagnosis not present

## 2015-12-14 DIAGNOSIS — G8929 Other chronic pain: Secondary | ICD-10-CM | POA: Diagnosis not present

## 2015-12-14 DIAGNOSIS — M5440 Lumbago with sciatica, unspecified side: Secondary | ICD-10-CM

## 2015-12-14 DIAGNOSIS — R609 Edema, unspecified: Secondary | ICD-10-CM | POA: Diagnosis not present

## 2015-12-14 DIAGNOSIS — W19XXXA Unspecified fall, initial encounter: Secondary | ICD-10-CM

## 2015-12-14 DIAGNOSIS — I509 Heart failure, unspecified: Secondary | ICD-10-CM | POA: Diagnosis not present

## 2015-12-14 DIAGNOSIS — I10 Essential (primary) hypertension: Secondary | ICD-10-CM | POA: Diagnosis not present

## 2015-12-14 NOTE — Progress Notes (Signed)
Facility      Place of Service: Clinic (12)     Allergies  Allergen Reactions  . Aleve [Naproxen Sodium]   . Celebrex [Celecoxib]     Lips swelled  . Sulfur     Lips swelled  . Vioxx [Rofecoxib] Other (See Comments)    unknown    Chief Complaint  Patient presents with  . Medical Management of Chronic Issues    3 month medication management blood pressure, CHF, depression  . Fall    on 12/11/15 on injury, lost balance while turning, had walker with him.    HPI:  Fall, initial encounter - Patient fell in his room. He became over balance. He will did have his walker at the time. There were no injuries sustained.  Essential hypertension - blood pressures are low. 2 days ago his blood pressure registered 98/58.  Edema, unspecified type - improved with compression stockings  Chronic congestive heart failure, unspecified congestive heart failure type ([place]) - compensated  Heel pain, left - still has early morning awakening about 4:30 AM with left heel discomfort.  Chronic midline low back pain with sciatica, sciatica laterality unspecified - pains aren't better control with methadone. Hydrocodone/APAP was removed from his list of drug 11/12/2015. He seems to doing well without this.    Medications: Patient's Medications  New Prescriptions   No medications on file  Previous Medications   ACETAMINOPHEN (TYLENOL) 650 MG CR TABLET    Take 650 mg by mouth 2 (two) times daily as needed. For pain   AMOXICILLIN (AMOXIL) 500 MG CAPSULE    Take 2,000 mg by mouth daily as needed (prior to surgery). Reported on 09/14/2015   ASPIRIN 81 MG TABLET    Take 81 mg by mouth daily.   BIMATOPROST (LUMIGAN) 0.01 % SOLN    Place 1 drop into both eyes at bedtime.    CARBAMIDE PEROXIDE (DEBROX) 6.5 % OTIC SOLUTION    Reported on 07/13/2015   CARBOXYMETHYLCELLULOSE (REFRESH PLUS) 0.5 % SOLN    1 drop. Both eyes four times daily as needed for dry eyes   DICLOFENAC SODIUM (VOLTAREN) 1 % GEL     Apply four times a day as needed for pain   EMOLLIENT (GOLD BOND ULTIMATE HEALING) LOTN    Apply 5 mLs topically 2 (two) times daily.   FINASTERIDE (PROSCAR) 5 MG TABLET    Take one tablet by mouth once daily   FLUTICASONE (FLONASE) 50 MCG/ACT NASAL SPRAY    Place 1 spray into both nostrils 2 (two) times daily.   FUROSEMIDE (LASIX) 20 MG TABLET    Take one tablet by mouth once daily. One twice weekly as needed for increased weight 2-3 lbs   GUAIFENESIN (MUCINEX) 600 MG 12 HR TABLET    Take 600 mg by mouth 2 (two) times daily.    KLOR-CON M10 10 MEQ TABLET    Take 1 tablet by mouth twice weekly as needed with Lasix   LIDOCAINE (ASPERCREME W/LIDOCAINE) 4 % CREAM    Apply morning and evening to painful area of the lower back   LISINOPRIL (PRINIVIL,ZESTRIL) 20 MG TABLET    Take one tablet twice daily for blood pressure   LORATADINE (CLARITIN) 10 MG TABLET    Take 10 mg by mouth daily. Reported on 07/13/2015   METHADONE (DOLOPHINE) 5 MG TABLET    Take 1 tablet every 12 hours to relieve pain   METOPROLOL TARTRATE (LOPRESSOR) 25 MG TABLET    One twice  daily to control blood pressure   MULTIPLE VITAMINS-MINERALS (ICAPS PO)    Take by mouth. Take one daily   NAPROXEN SODIUM (ANAPROX) 220 MG TABLET    Take 220 mg by mouth 2 (two) times daily with a meal.   NITROGLYCERIN (NITROSTAT) 0.4 MG SL TABLET    Place 0.4 mg under the tongue every 5 (five) minutes as needed. Reported on 07/13/2015   OMEPRAZOLE (PRILOSEC) 20 MG CAPSULE    Take 20 mg by mouth daily.   POLYETHYL GLYCOL-PROPYL GLYCOL 0.4-0.3 % SOLN    Apply to eye. One drop both eyes twice daily   POLYETHYLENE GLYCOL (MIRALAX / GLYCOLAX) PACKET    Take 17 g by mouth daily. Mix with 8 ounces of water or juice and take by mouth every other day for constipation.   SERTRALINE (ZOLOFT) 50 MG TABLET    One daily to help depression   TAMSULOSIN HCL (FLOMAX) 0.4 MG CAPS    Take 0.4 mg by mouth daily.   ZOLPIDEM (AMBIEN) 5 MG TABLET    Take one tablet by mouth at  bedtime for rest  Modified Medications   No medications on file  Discontinued Medications   HYDROCODONE-ACETAMINOPHEN (NORCO/VICODIN) 5-325 MG PER TABLET    Take 1 tablet by mouth every 8 (eight) hours as needed (pain).     Review of Systems  Constitutional: Negative.  Negative for activity change, appetite change, fatigue and unexpected weight change.  HENT: Negative.  Negative for hearing loss, sinus pressure and sore throat.   Eyes:       Hx macular degeneration. Receiving injections every 6-8 weeks.  Respiratory: Positive for cough. Negative for apnea, chest tightness, shortness of breath, wheezing and stridor.   Cardiovascular: Positive for leg swelling. Negative for chest pain and palpitations.  Gastrointestinal: Negative for abdominal distention, abdominal pain, nausea and vomiting.       Flatulence  Endocrine: Negative.   Genitourinary: Negative for decreased urine volume, frequency and urgency.       BPH.  Musculoskeletal: Positive for back pain and gait problem (uunstable. uses walker). Negative for neck pain and neck stiffness.       X-ray reports from 2009 and CT of the lumbar spine shw this patient has foraminal stenoses and  spinal stenosis now. There is multiple intervertebral degenerative disc disease. He has had steroid injections at [place], Dr. Laurence Spates. Some pain radiates down the right leg. He is now comfortable moving around using his 4 wheel walker. He is able to get in bed and lay down comfortably overnight.  Skin:       Dry. Itching on dorsum of hands.  Allergic/Immunologic: Negative.   Hematological: Negative.   Psychiatric/Behavioral: Positive for sleep disturbance (insomnia. Ambien helps.).    Vitals:   12/14/15 0857  BP: 120/62  Pulse: 65  Temp: 97.4 F (36.3 C)  TempSrc: Oral  SpO2: 99%  Weight: 185 lb (83.9 kg)  Height: _0  (1.727 m)   Wt Readings from Last 3 Encounters:  12/14/15 185 lb (83.9 kg)  09/14/15 186 lb  6.4 oz (84.6 kg)  07/13/15 188 lb 3.2 oz (85.4 kg)    Body mass index is 28.13 kg/m.  Physical Exam  Constitutional: He is oriented to person, place, and time. He appears well-nourished. No distress.  Frail  HENT:  Head: Normocephalic and atraumatic.  Right Ear: External ear normal.  Left Ear: External ear normal.  Nose: Nose normal.  Mouth/Throat: Oropharynx is clear and moist.  Mild decrease in hearing  Eyes: Conjunctivae and EOM are normal. Pupils are equal, round, and reactive to light.  Neck: No JVD present. No tracheal deviation present. No thyromegaly present.  Cardiovascular: Normal rate, regular rhythm and intact distal pulses.  Exam reveals no gallop and no friction rub.   No murmur heard. Pulmonary/Chest: Effort normal. No respiratory distress. He has no wheezes. He has rales (Right chest).  Abdominal: He exhibits no distension and no mass. There is no tenderness.  Large upper diastasis recti  Musculoskeletal: Normal range of motion. He exhibits edema (1+ bipedal). He exhibits no tenderness.  Using walker. Bilateral knee prosthesis. Tender lumbar spine and shading towards the left side of the small of the back. BLE trace edema  Lymphadenopathy:    He has no cervical adenopathy.  Neurological: He is alert and oriented to person, place, and time. No cranial nerve deficit. Coordination normal.  Diminished sensation to vibration in the right foot.  Skin: Skin is dry. No rash noted. No erythema. No pallor.  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 Multiple SK.  Psychiatric: He has a normal mood and affect. His behavior is normal. Judgment and thought content normal.     Labs reviewed: Lab Summary Latest Ref Rng & Units 11/11/2015 10/19/2014  Hemoglobin 13.5 - 17.5 g/dL 11.9(A) 12.9(A)  Hematocrit 41 - 53 % 35(A) 37(A)  White count 10:3/mL 5.9 8.1  Platelet count 150 - 399 K/L 157 191  Sodium 137 - 147 mmol/L 136(A) 135(A)   Potassium 3.4 - 5.3 mmol/L 4.9 4.4  Calcium - (None) (None)  Phosphorus - (None) (None)  Creatinine 0.6 - 1.3 mg/dL 1.4(A) 1.2  AST 14 - 40 U/L 13(A) 23  Alk Phos 25 - 125 U/L 57 56  Bilirubin - (None) (None)  Glucose mg/dL 106 113  Cholesterol - (None) (None)  HDL cholesterol - (None) (None)  Triglycerides - (None) (None)  LDL Direct - (None) (None)  LDL Calc - (None) (None)  Total protein - (None) (None)  Albumin - (None) (None)  Some recent data might be hidden   Lab Results  Component Value Date   TSH 1.10 10/19/2014   Lab Results  Component Value Date   BUN 36 (A) 11/11/2015   BUN 32 (A) 10/19/2014   BUN 36 (A) 10/20/2013   Lab Results  Component Value Date   CREATININE 1.4 (A) 11/11/2015   CREATININE 1.2 10/19/2014   CREATININE 1.3 10/20/2013   Lab Results  Component Value Date   HGBA1C 6.1 (H) 04/22/2012       Assessment/Plan  1. Fall, initial encounter Main reasons for follow-up related to his gait instability and loss of balance. This is a chronic problem. Low blood pressures noted suggests that we may be able to do with less lisinopril. This dose was decreased today  2. Essential hypertension -Reduce lisinopril 20 mg daily (previously was twice daily).  3. Edema, unspecified type Continue compression stockings  4. Chronic congestive heart failure, unspecified congestive heart failure type ([place]) Compensated  5. Heel pain, left No ulcerations or focal tenderness  6. Chronic midline low back pain with sciatica, sciatica laterality unspecified Continue methadone and Tylenol.

## 2015-12-22 NOTE — Progress Notes (Signed)
Electrophysiology Office Note Date: 12/23/2015  ID:  Karthikeya, Funke 24-Dec-1913, MRN [id number]  PCP: Murray Hodgkins, MD Primary Cardiologist: Katrinka Blazing Electrophysiologist: Allred  CC: Pacemaker follow-up  Caleb Nash is a 82 y.o. male seen today for Dr Johney Frame.  He presents today for routine electrophysiology followup.  Since last being seen in our clinic, the patient reports doing relatively well.  He has problems with chronic back pain and decreased mobility.  He denies chest pain, palpitations, dyspnea, PND, orthopnea, nausea, vomiting, dizziness, syncope, edema, weight gain, or early satiety.  Device History: MDT dual chamber PPM implanted 2005 for tachy/brady; gen change 2014   Past Medical History:  Diagnosis Date  . Abnormality of gait 06/11/2012  . Alcohol abuse 04/26/2012  . Allergy, unspecified not elsewhere classified 04/28/2012  . Atrial fibrillation (HCC) 04/28/2012  . BPH (benign prostatic hyperplasia)   . CAD (coronary artery disease)    s/p CABG 2005  . Cardiac pacemaker in situ 04/28/2012  . CHF (congestive heart failure) (HCC) 04/23/2012  . Chronic airway obstruction, not elsewhere classified 04/26/2012  . Congestive heart failure, unspecified 04/28/2012  . Coronary atherosclerosis of native coronary artery 04/28/2012  . Degenerative joint disease 04/21/2012  . Depression    mild per pt  . Edema 04/28/2012  . Essential hypertension 10/17/2012  . Fall 04/21/2012   Prolonged downtime 04/20/2012   . GERD (gastroesophageal reflux disease) 09/13/2012  . Hip pain, right 09/02/2013  . History of rhabdomyolysis   . HTN (hypertension)   . Hyperlipidemia   . Hypertrophy of prostate without urinary obstruction and other lower urinary tract symptoms (LUTS) 04/28/2012  . Ingrowing nail 06/11/2012  . Insomnia 11/15/2012  . Insomnia, unspecified 04/28/2012  . Lumbago   . Macular degeneration   . Major depressive disorder, single episode, unspecified (HCC) 04/28/2012  . Muscle weakness  (generalized) 04/26/2012  . NSTEMI (non-ST elevated myocardial infarction) (HCC) 04/23/2012   Related to the stress of rhabdomyolysis and prolonged downtime prior to admission in a patient with known coronary artery disease   . Osteoarthritis   . Other specified disease of white blood cells 04/26/2012  . Pacemaker 04/21/2012   Near EOL.   . Paroxysmal atrial tachycardia (HCC) 04/21/2012   Brief episodes. Not felt to be an anticoagulation candidate because of age and frailty   . Pericarditis 04/21/2012  . Personal history of fall 04/28/2012  . Reflux esophagitis 04/28/2012  . Rhabdomyolysis   . Seborrheic eczema 12/03/2012  . Sick sinus syndrome (HCC) 10/14/2003; 10/29/2012   MDT EnPulse implanted by Dr Amil Amen for SSS; generator change 10/29/2012 by Dr Johney Frame MDT Jana Half pacemaker  . Sporotrichosis    remote  . Synovial cyst of wrist 11/10/2014   Left wrist    . Tachy-brady syndrome (HCC) 04/21/2012   Pacemaker is near EOL   . Unspecified constipation 09/13/2012  . Unspecified glaucoma 04/28/2012   Past Surgical History:  Procedure Laterality Date  . ANGIOPLASTY  9/11/190   Dr. Katrinka Blazing  . APPENDECTOMY    . BACK SURGERY  03/11/2001  . bilateral knee arthroplasty  09/28/83; 05/17/1992   right then left  . CARPAL TUNNEL RELEASE Left   . CARPAL TUNNEL RELEASE Right 2007  . CATARACT EXTRACTION Right 10/30/1996  . CORONARY ARTERY BYPASS GRAFT  2005  . CYSTOSCOPY  08/14/85  . ELBOW SURGERY     repair  . EPIDIDYMIS SURGERY  09/17/1985  . HERNIA REPAIR Right 05/19/1976  . PACEMAKER GENERATOR CHANGE  10/29/12  Dr. Johney FrameAllred  . PACEMAKER GENERATOR CHANGE N/A 10/29/2012   Procedure: PACEMAKER GENERATOR CHANGE;  Surgeon: Hillis RangeJames Allred, MD;  [facility];  Service: Cardiovascular;  Laterality: N/A;  . PACEMAKER INSERTION  10/14/2003;10/29/2012   MDT Implanted by Dr Amil AmenEdmunds for SSS; generator change 10/29/2012 by Dr Johney FrameAllred [employer] [place]  . REPLACEMENT TOTAL KNEE BILATERAL  1992 and 1996  . ROTATOR CUFF REPAIR  Right 2008  . SHOULDER OPEN ROTATOR CUFF REPAIR Left 02/07/1996  . SIGMOIDOSCOPY  08/26/2001  . TOE SURGERY Right 2005   correction  . TRANSURETHRAL RESECTION OF PROSTATE  07/27/1983  . YAG LASER APPLICATION Right 12/26/1973   eye  . YAG LASER APPLICATION Right 04/30/2001  . YAG LASER APPLICATION Left 05/07/2001    Current Outpatient Prescriptions  Medication Sig Dispense Refill  . acetaminophen (TYLENOL) 650 MG CR tablet Take 650 mg by mouth 2 (two) times daily as needed. For pain    . amoxicillin (AMOXIL) 500 MG capsule Take 2,000 mg by mouth daily as needed (prior to surgery). Reported on 09/14/2015    . aspirin 81 MG tablet Take 81 mg by mouth daily.    . bimatoprost (LUMIGAN) 0.01 % SOLN Place 1 drop into both eyes at bedtime.     . carbamide peroxide (DEBROX) 6.5 % otic solution Reported on 07/13/2015    . carboxymethylcellulose (REFRESH PLUS) 0.5 % SOLN 1 drop. Both eyes four times daily as needed for dry eyes    . diclofenac sodium (VOLTAREN) 1 % GEL Apply four times a day as needed for pain  10  . Emollient (GOLD BOND ULTIMATE HEALING) LOTN Apply 5 mLs topically 2 (two) times daily. 396 g 6  . finasteride (PROSCAR) 5 MG tablet Take one tablet by mouth once daily    . fluticasone (FLONASE) 50 MCG/ACT nasal spray Place 1 spray into both nostrils 2 (two) times daily.    . furosemide (LASIX) 20 MG tablet Take one tablet by mouth once daily. One twice weekly as needed for increased weight 2-3 lbs 30 tablet 5  . guaiFENesin (MUCINEX) 600 MG 12 hr tablet Take 600 mg by mouth 2 (two) times daily.     Marland Kitchen. KLOR-CON M10 10 MEQ tablet Take 1 tablet by mouth twice weekly as needed with Lasix    . lidocaine (ASPERCREME W/LIDOCAINE) 4 % cream Apply morning and evening to painful area of the lower back 120 g 5  . lisinopril (PRINIVIL,ZESTRIL) 20 MG tablet Take one tablet daily for blood pressure    . loratadine (CLARITIN) 10 MG tablet Take 10 mg by mouth daily. Reported on 07/13/2015    . methadone  (DOLOPHINE) 5 MG tablet Take 1 tablet every 12 hours to relieve pain 60 tablet 0  . metoprolol tartrate (LOPRESSOR) 25 MG tablet Take 25 mg by mouth 2 (two) times daily.    . Multiple Vitamins-Minerals (ICAPS PO) Take by mouth. Take one daily    . naproxen sodium (ANAPROX) 220 MG tablet Take 220 mg by mouth 2 (two) times daily with a meal.    . nitroGLYCERIN (NITROSTAT) 0.4 MG SL tablet Place 0.4 mg under the tongue every 5 (five) minutes as needed. Reported on 07/13/2015    . omeprazole (PRILOSEC) 20 MG capsule Take 20 mg by mouth daily.    Bertram Gala. Polyethyl Glycol-Propyl Glycol 0.4-0.3 % SOLN Apply to eye. One drop both eyes twice daily    . polyethylene glycol (MIRALAX / GLYCOLAX) packet Take 17 g by mouth daily. Mix with  8 ounces of water or juice and take by mouth every other day for constipation.    . sertraline (ZOLOFT) 50 MG tablet One daily to help depression (Patient taking differently: Take one tablet by mouth daily to help depression) 30 tablet 5  . Tamsulosin HCl (FLOMAX) 0.4 MG CAPS Take 0.4 mg by mouth daily.    Marland Kitchen zolpidem (AMBIEN) 5 MG tablet Take one tablet by mouth at bedtime for rest 30 tablet 0   No current facility-administered medications for this visit.     Allergies:   Aleve [naproxen sodium]; Celebrex [celecoxib]; Sulfur; and Vioxx [rofecoxib]   Social History: Social History   Social History  . Marital status: Single    Spouse name: N/A  . Number of children: N/A  . Years of education: N/A   Occupational History  . retired IT trainer    Social History Main Topics  . Smoking status: Never Smoker  . Smokeless tobacco: Never Used  . Alcohol use No  . Drug use: No  . Sexual activity: No   Other Topics Concern  . Not on file   Social History Narrative   Pt lives in assisted living at [facility] following a fall 12/13.   Retired IT trainer.   Widowed   No siblings    MOST  Form signed   DNR   Walks with walker   Exercise -self in room each morning     Family  History: Family History  Problem Relation Age of Onset  . CVA Mother   . CVA Father   . Cancer       Review of Systems: All other systems reviewed and are otherwise negative except as noted above.   Physical Exam: VS:  BP (!) 132/58   Pulse 73   Ht 5\' 8"  (1.727 m)   Wt 184 lb 6.4 oz (83.6 kg)   BMI 28.04 kg/m  , BMI Body mass index is 28.04 kg/m.  GEN- The patient is elderly appearing, alert and oriented x 3 today.   HEENT: normocephalic, atraumatic; sclera clear, conjunctiva pink; hearing intact; oropharynx clear; neck supple Lungs- Clear to ausculation bilaterally, normal work of breathing.  No wheezes, rales, rhonchi Heart- Regular rate and rhythm GI- soft, non-tender, non-distended, bowel sounds present Extremities- no clubbing, cyanosis, or edema MS- no significant deformity or atrophy Skin- warm and dry, no rash or lesion; PPM pocket well healed Psych- euthymic mood, full affect Neuro- strength and sensation are intact  PPM Interrogation- reviewed in detail today,  See PACEART report  EKG:  EKG is not ordered today.  Recent Labs: 11/11/2015: ALT 10; BUN 36; Creatinine 1.4; Hemoglobin 11.9; Platelets 157; Potassium 4.9; Sodium 136   Wt Readings from Last 3 Encounters:  12/23/15 184 lb 6.4 oz (83.6 kg)  12/14/15 185 lb (83.9 kg)  09/14/15 186 lb 6.4 oz (84.6 kg)     Other studies Reviewed: Additional studies/ records that were reviewed today include: Dr Jenel Lucks office notes  Assessment and Plan:  1.  Sick sinus syndrome Normal PPM function See Pace Art report No changes today  2.  CAD No recent ischemic symptoms Continue medical therapy  3.  HTN Stable No change required today  4.  Paroxysmal atrial fibrillation None detected by device in last 3 years Pt continues to wish to avoid OAC  5.  Decreased mobility It is becoming increasingly difficult for the patient to get out to physician appointments. We have discussed today continuing remote  monitoring and only  seeing in office if needed going forward.  He is in agreement.     Current medicines are reviewed at length with the patient today.   The patient does not have concerns regarding his medicines.  The following changes were made today:  none  Labs/ tests ordered today include:  Orders Placed This Encounter  Procedures  . EKG 12-Lead     Disposition:   Follow up with [facility], Dr Johney Frame in office prn    Signed, Gypsy Balsam, NP 12/23/2015 11:55 AM  [facility] [address] [place] [address] [phone] (office) [phone] (fax

## 2015-12-23 ENCOUNTER — Ambulatory Visit (INDEPENDENT_AMBULATORY_CARE_PROVIDER_SITE_OTHER): Payer: Medicare Other | Admitting: Nurse Practitioner

## 2015-12-23 ENCOUNTER — Encounter: Payer: Medicare Other | Admitting: Nurse Practitioner

## 2015-12-23 ENCOUNTER — Encounter: Payer: Self-pay | Admitting: Nurse Practitioner

## 2015-12-23 ENCOUNTER — Encounter: Payer: Self-pay | Admitting: Internal Medicine

## 2015-12-23 VITALS — BP 132/58 | HR 73 | Ht 68.0 in | Wt 184.4 lb

## 2015-12-23 DIAGNOSIS — I10 Essential (primary) hypertension: Secondary | ICD-10-CM

## 2015-12-23 DIAGNOSIS — I480 Paroxysmal atrial fibrillation: Secondary | ICD-10-CM | POA: Diagnosis not present

## 2015-12-23 DIAGNOSIS — I495 Sick sinus syndrome: Secondary | ICD-10-CM

## 2015-12-23 NOTE — Patient Instructions (Addendum)
Medication Instructions:  Your physician recommends that you continue on your current medications as directed. Please refer to the Current Medication list given to you today.   Labwork: None ordered  Testing/Procedures: None ordered  Follow-Up: Remote monitoring is used to monitor your Pacemaker of ICD from home. This monitoring reduces the number of office visits required to check your device to one time per year. It allows us to keep an eye on the functioning of your device to ensure it is working properly. You are scheduled for a device check from home on 03/23/2016  You may send your transmission at any time that day. If you have a wireless device, the transmission will be sent automatically. After your physician reviews your transmission, you will receive a postcard with your next transmission date.   Any Other Special Instructions Will Be Listed Below (If Applicable).    If you need a refill on your cardiac medications before your next appointment, please call your pharmacy.

## 2016-01-05 LAB — CUP PACEART INCLINIC DEVICE CHECK
Date Time Interrogation Session: 20170913145934
Implantable Lead Implant Date: 20050622
Implantable Lead Location: 753859
Implantable Lead Model: 5092
MDC IDC LEAD IMPLANT DT: 20050622
MDC IDC LEAD LOCATION: 753860

## 2016-01-06 DIAGNOSIS — H353211 Exudative age-related macular degeneration, right eye, with active choroidal neovascularization: Secondary | ICD-10-CM | POA: Diagnosis not present

## 2016-01-13 ENCOUNTER — Non-Acute Institutional Stay: Payer: Medicare Other | Admitting: Nurse Practitioner

## 2016-01-13 ENCOUNTER — Encounter: Payer: Self-pay | Admitting: Nurse Practitioner

## 2016-01-13 DIAGNOSIS — G4700 Insomnia, unspecified: Secondary | ICD-10-CM | POA: Diagnosis not present

## 2016-01-13 DIAGNOSIS — K219 Gastro-esophageal reflux disease without esophagitis: Secondary | ICD-10-CM

## 2016-01-13 DIAGNOSIS — F329 Major depressive disorder, single episode, unspecified: Secondary | ICD-10-CM

## 2016-01-13 DIAGNOSIS — I471 Supraventricular tachycardia: Secondary | ICD-10-CM | POA: Diagnosis not present

## 2016-01-13 DIAGNOSIS — W19XXXA Unspecified fall, initial encounter: Secondary | ICD-10-CM | POA: Diagnosis not present

## 2016-01-13 DIAGNOSIS — K5900 Constipation, unspecified: Secondary | ICD-10-CM | POA: Diagnosis not present

## 2016-01-13 DIAGNOSIS — R609 Edema, unspecified: Secondary | ICD-10-CM | POA: Diagnosis not present

## 2016-01-13 DIAGNOSIS — I5020 Unspecified systolic (congestive) heart failure: Secondary | ICD-10-CM | POA: Diagnosis not present

## 2016-01-13 DIAGNOSIS — I10 Essential (primary) hypertension: Secondary | ICD-10-CM

## 2016-01-13 DIAGNOSIS — M159 Polyosteoarthritis, unspecified: Secondary | ICD-10-CM

## 2016-01-13 DIAGNOSIS — F32A Depression, unspecified: Secondary | ICD-10-CM

## 2016-01-13 DIAGNOSIS — M150 Primary generalized (osteo)arthritis: Secondary | ICD-10-CM

## 2016-01-13 DIAGNOSIS — N400 Enlarged prostate without lower urinary tract symptoms: Secondary | ICD-10-CM | POA: Diagnosis not present

## 2016-01-13 NOTE — Assessment & Plan Note (Signed)
Mood is stable, continue Sertraline 50mg daily.  

## 2016-01-13 NOTE — Assessment & Plan Note (Signed)
trace in BLE, continue Furosemide 20mg daily. 

## 2016-01-13 NOTE — Assessment & Plan Note (Signed)
Stable,  takes MiraLax daily.  

## 2016-01-13 NOTE — Assessment & Plan Note (Signed)
Compensated clinically, continue Furosemide 20mg daily.  

## 2016-01-13 NOTE — Assessment & Plan Note (Signed)
Multiple sites, continue Methadone 5mg , Naproxen 220mg  bid.

## 2016-01-13 NOTE — Progress Notes (Signed)
Location:  [facility] Room # Caleb Nash 11 Service place:  ALF [phone]) Provider:  Madhav Mohon, Manxie  NP  Murray Hodgkins, MD  Patient Care Team: Kimber Relic, MD as PCP - General (Internal Medicine) [facility] Rayyan Burley Johnney Ou, NP as Nurse Practitioner (Nurse Practitioner) Lyn Records, MD as Consulting Physician (Cardiology) Barron Alvine, MD as Consulting Physician (Urology) Francisca December, MD (Inactive) as Attending Physician (Cardiology) Leta Speller, MD as Consulting Physician (Dermatology) Elise Benne, MD as Consulting Physician (Ophthalmology)  Extended Emergency Contact Information Primary Emergency Contact: Mortimore,Janet Address: [address]          [place], [place] [address] [place] of [place] Home Phone: [phone] Mobile Phone: [phone] Relation: Daughter  Code Status: DNR Goals of care: Advanced Directive information Advanced Directives 12/14/2015  Does patient have an advance directive? Yes  Type of Estate agent of [place];Living will;Out of facility DNR (pink MOST or yellow form)  Does patient want to make changes to advanced directive? -  Copy of advanced directive(s) in chart? Yes  Pre-existing out of facility DNR order (yellow form or pink MOST form) -     Chief Complaint  Patient presents with  . Acute Visit    Larey Seat on 9/20,   . Medical Management of Chronic Issues    HPI:  Pt is a 82 y.o. male seen today for an acute visit for s/p fall 01/12/16, no apparent injury, the patient denied chest pain, palpitation, focal weakness or lightheadedness associated with his falling.   Hx of HTN, controlled on Lisinopril, Metoprolol, and Furosemide, trace edema in ankles has no change, sleeps with aid of Ambien, his mood is table while taking Sertraline 50mg , not constipation while on MiraLax daily. Hx of GERD controlled symptoms with Omeprazole. Chronic back pain is better managed with Methadone 5mg  bid, Norco prn, Naproxen  220mg  bid. He is stable in AL setting.    Past Medical History:  Diagnosis Date  . Abnormality of gait 06/11/2012  . Alcohol abuse 04/26/2012  . Allergy, unspecified not elsewhere classified 04/28/2012  . Atrial fibrillation (HCC) 04/28/2012  . BPH (benign prostatic hyperplasia)   . CAD (coronary artery disease)    s/p CABG 2005  . Cardiac pacemaker in situ 04/28/2012  . CHF (congestive heart failure) (HCC) 04/23/2012  . Chronic airway obstruction, not elsewhere classified 04/26/2012  . Congestive heart failure, unspecified 04/28/2012  . Coronary atherosclerosis of native coronary artery 04/28/2012  . Degenerative joint disease 04/21/2012  . Depression    mild per pt  . Edema 04/28/2012  . Essential hypertension 10/17/2012  . Fall 04/21/2012   Prolonged downtime 04/20/2012   . GERD (gastroesophageal reflux disease) 09/13/2012  . Hip pain, right 09/02/2013  . History of rhabdomyolysis   . HTN (hypertension)   . Hyperlipidemia   . Hypertrophy of prostate without urinary obstruction and other lower urinary tract symptoms (LUTS) 04/28/2012  . Ingrowing nail 06/11/2012  . Insomnia 11/15/2012  . Insomnia, unspecified 04/28/2012  . Lumbago   . Macular degeneration   . Major depressive disorder, single episode, unspecified (HCC) 04/28/2012  . Muscle weakness (generalized) 04/26/2012  . NSTEMI (non-ST elevated myocardial infarction) (HCC) 04/23/2012   Related to the stress of rhabdomyolysis and prolonged downtime prior to admission in a patient with known coronary artery disease   . Osteoarthritis   . Other specified disease of white blood cells 04/26/2012  . Pacemaker 04/21/2012   Near EOL.   . Paroxysmal atrial tachycardia (HCC)  04/21/2012   Brief episodes. Not felt to be an anticoagulation candidate because of age and frailty   . Pericarditis 04/21/2012  . Personal history of fall 04/28/2012  . Reflux esophagitis 04/28/2012  . Rhabdomyolysis   . Seborrheic eczema 12/03/2012  . Sick sinus syndrome (HCC)  10/14/2003; 10/29/2012   MDT EnPulse implanted by Dr Amil Amen for SSS; generator change 10/29/2012 by Dr Johney Frame MDT Jana Half pacemaker  . Sporotrichosis    remote  . Synovial cyst of wrist 11/10/2014   Left wrist    . Tachy-brady syndrome (HCC) 04/21/2012   Pacemaker is near EOL   . Unspecified constipation 09/13/2012  . Unspecified glaucoma 04/28/2012   Past Surgical History:  Procedure Laterality Date  . ANGIOPLASTY  9/11/190   Dr. Katrinka Blazing  . APPENDECTOMY    . BACK SURGERY  03/11/2001  . bilateral knee arthroplasty  09/28/83; 05/17/1992   right then left  . CARPAL TUNNEL RELEASE Left   . CARPAL TUNNEL RELEASE Right 2007  . CATARACT EXTRACTION Right 10/30/1996  . CORONARY ARTERY BYPASS GRAFT  2005  . CYSTOSCOPY  08/14/85  . ELBOW SURGERY     repair  . EPIDIDYMIS SURGERY  09/17/1985  . HERNIA REPAIR Right 05/19/1976  . PACEMAKER GENERATOR CHANGE  10/29/12   Dr. Johney Frame  . PACEMAKER GENERATOR CHANGE N/A 10/29/2012   Procedure: PACEMAKER GENERATOR CHANGE;  Surgeon: Hillis Range, MD;  Location: [facility] CATH LAB;  Service: Cardiovascular;  Laterality: N/A;  . PACEMAKER INSERTION  10/14/2003;10/29/2012   MDT Implanted by Dr Amil Amen for SSS; generator change 10/29/2012 by Dr Johney Frame [employer] [place]  . REPLACEMENT TOTAL KNEE BILATERAL  1992 and 1996  . ROTATOR CUFF REPAIR Right 2008  . SHOULDER OPEN ROTATOR CUFF REPAIR Left 02/07/1996  . SIGMOIDOSCOPY  08/26/2001  . TOE SURGERY Right 2005   correction  . TRANSURETHRAL RESECTION OF PROSTATE  07/27/1983  . YAG LASER APPLICATION Right 12/26/1973   eye  . YAG LASER APPLICATION Right 04/30/2001  . YAG LASER APPLICATION Left 05/07/2001    Allergies  Allergen Reactions  . Aleve [Naproxen Sodium]   . Celebrex [Celecoxib]     Lips swelled  . Sulfur     Lips swelled  . Vioxx [Rofecoxib] Other (See Comments)    unknown      Medication List       Accurate as of 01/13/16 11:11 AM. Always use your most recent med list.          acetaminophen 650 MG CR  tablet Commonly known as:  TYLENOL Take 650 mg by mouth 2 (two) times daily as needed. For pain   amoxicillin 500 MG capsule Commonly known as:  AMOXIL Take 2,000 mg by mouth daily as needed (prior to surgery). Reported on 09/14/2015   aspirin 81 MG tablet Take 81 mg by mouth daily.   bimatoprost 0.01 % Soln Commonly known as:  LUMIGAN Place 1 drop into both eyes at bedtime.   carbamide peroxide 6.5 % otic solution Commonly known as:  DEBROX Reported on 07/13/2015   diclofenac sodium 1 % Gel Commonly known as:  VOLTAREN Apply four times a day as needed for pain   finasteride 5 MG tablet Commonly known as:  PROSCAR Take one tablet by mouth once daily   fluticasone 50 MCG/ACT nasal spray Commonly known as:  FLONASE Place 1 spray into both nostrils 2 (two) times daily.   furosemide 20 MG tablet Commonly known as:  LASIX Take one tablet by mouth once daily. One twice  weekly as needed for increased weight 2-3 lbs   GOLD BOND ULTIMATE HEALING Lotn Apply 5 mLs topically 2 (two) times daily.   guaiFENesin 600 MG 12 hr tablet Commonly known as:  MUCINEX Take 600 mg by mouth 2 (two) times daily.   ICAPS PO Take by mouth. Take one daily   KLOR-CON M10 10 MEQ tablet Generic drug:  potassium chloride Take 1 tablet by mouth twice weekly as needed with Lasix   lidocaine 4 % cream Commonly known as:  ASPERCREME W/LIDOCAINE Apply morning and evening to painful area of the lower back   lisinopril 20 MG tablet Commonly known as:  PRINIVIL,ZESTRIL Take one tablet daily for blood pressure   loratadine 10 MG tablet Commonly known as:  CLARITIN Take 10 mg by mouth daily. Reported on 07/13/2015   methadone 5 MG tablet Commonly known as:  DOLOPHINE Take 1 tablet every 12 hours to relieve pain   metoprolol tartrate 25 MG tablet Commonly known as:  LOPRESSOR Take 25 mg by mouth 2 (two) times daily.   naproxen sodium 220 MG tablet Commonly known as:  ANAPROX Take 220 mg by  mouth 2 (two) times daily with a meal.   nitroGLYCERIN 0.4 MG SL tablet Commonly known as:  NITROSTAT Place 0.4 mg under the tongue every 5 (five) minutes as needed. Reported on 07/13/2015   omeprazole 20 MG capsule Commonly known as:  PRILOSEC Take 20 mg by mouth daily.   Polyethyl Glycol-Propyl Glycol 0.4-0.3 % Soln Apply to eye. One drop both eyes twice daily   polyethylene glycol packet Commonly known as:  MIRALAX / GLYCOLAX Take 17 g by mouth daily. Mix with 8 ounces of water or juice and take by mouth every other day for constipation.   REFRESH PLUS 0.5 % Soln Generic drug:  carboxymethylcellulose 1 drop. Both eyes four times daily as needed for dry eyes   sertraline 50 MG tablet Commonly known as:  ZOLOFT One daily to help depression   tamsulosin 0.4 MG Caps capsule Commonly known as:  FLOMAX Take 0.4 mg by mouth daily.   zolpidem 5 MG tablet Commonly known as:  AMBIEN Take one tablet by mouth at bedtime for rest       Review of Systems  Constitutional: Negative.  Negative for activity change, appetite change, fatigue and unexpected weight change.  HENT: Negative.  Negative for hearing loss, sinus pressure and sore throat.   Eyes:       Hx macular degeneration. Receiving injections every 6-8 weeks.  Respiratory: Positive for cough. Negative for apnea, chest tightness, shortness of breath, wheezing and stridor.   Cardiovascular: Positive for leg swelling. Negative for chest pain and palpitations.  Gastrointestinal: Negative for abdominal distention, abdominal pain, nausea and vomiting.       Flatulence  Endocrine: Negative.   Genitourinary: Negative for decreased urine volume, frequency and urgency.       BPH.  Musculoskeletal: Positive for back pain and gait problem (uunstable. uses walker). Negative for neck pain and neck stiffness.       X-ray reports from 2009 and CT of the lumbar spine shw this patient has foraminal stenoses and  spinal stenosis now. There  is multiple intervertebral degenerative disc disease. He has had steroid injections at [facility], Dr. Naaman Plummer. Some pain radiates down the right leg. He is now comfortable moving around using his 4 wheel walker. He is able to get in bed and lay down comfortably overnight.  Skin:  Dry. Itching on dorsum of hands.  Allergic/Immunologic: Negative.   Hematological: Negative.   Psychiatric/Behavioral: Positive for sleep disturbance (insomnia. Ambien helps.).    Immunization History  Administered Date(s) Administered  . Influenza Whole 02/02/2013  . Influenza-Unspecified 01/29/2014, 01/28/2015  . PPD Test 05/09/2012  . Pneumococcal Polysaccharide-23 04/24/2009  . Tdap 04/21/2012  . Zoster 04/25/2007   Pertinent  Health Maintenance Due  Topic Date Due  . PNA vac Low Risk Adult (2 of 2 - PCV13) 04/24/2010  . INFLUENZA VACCINE  11/23/2015   Fall Risk  12/14/2015 07/13/2015 12/15/2014 08/04/2014  Falls in the past year? Yes No No No  Number falls in past yr: 1 - - -  Injury with Fall? No - - -  Risk for fall due to : - - - History of fall(s);Impaired balance/gait   Functional Status Survey:    Vitals:   01/13/16 0913  BP: 136/66  Pulse: 62  Resp: 18  Temp: (!) 96.9 F (36.1 C)  Weight: 182 lb (82.6 kg)  Height: 5\' 8"  (1.727 m)   Body mass index is 27.67 kg/m. Physical Exam  Constitutional: He is oriented to person, place, and time. He appears well-nourished. No distress.  Frail  HENT:  Head: Normocephalic and atraumatic.  Right Ear: External ear normal.  Left Ear: External ear normal.  Nose: Nose normal.  Mouth/Throat: Oropharynx is clear and moist.  Mild decrease in hearing  Eyes: Conjunctivae and EOM are normal. Pupils are equal, round, and reactive to light.  Neck: No JVD present. No tracheal deviation present. No thyromegaly present.  Cardiovascular: Normal rate, regular rhythm and intact distal pulses.  Exam reveals no gallop and no  friction rub.   No murmur heard. Pulmonary/Chest: Effort normal. No respiratory distress. He has no wheezes. He has rales (Right chest).  Abdominal: He exhibits no distension and no mass. There is no tenderness.  Large upper diastasis recti  Musculoskeletal: Normal range of motion. He exhibits edema (1+ bipedal). He exhibits no tenderness.  Using walker. Bilateral knee prosthesis. Tender lumbar spine and shading towards the left side of the small of the back. BLE trace edema  Lymphadenopathy:    He has no cervical adenopathy.  Neurological: He is alert and oriented to person, place, and time. No cranial nerve deficit. Coordination normal.  Diminished sensation to vibration in the right foot.  Skin: Skin is dry. No rash noted. No erythema. No pallor.  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 Multiple SK.  Psychiatric: He has a normal mood and affect. His behavior is normal. Judgment and thought content normal.    Labs reviewed:  Recent Labs  11/11/15  NA 136*  K 4.9  BUN 36*  CREATININE 1.4*    Recent Labs  11/11/15  AST 13*  ALT 10  ALKPHOS 57    Recent Labs  11/11/15  WBC 5.9  HGB 11.9*  HCT 35*  PLT 157   Lab Results  Component Value Date   TSH 1.10 10/19/2014   Lab Results  Component Value Date   HGBA1C 6.1 (H) 04/22/2012   Lab Results  Component Value Date   CHOL 149 04/22/2012   HDL 35 (L) 04/22/2012   LDLCALC 96 04/22/2012   TRIG 89 04/22/2012   CHOLHDL 4.3 04/22/2012    Significant Diagnostic Results in last 30 days:  No results found.  Assessment/Plan There are no diagnoses linked to this encounter.Paroxysmal atrial tachycardia Heart rate is  in control, continue Metoprolol 25mg  bid.   CHF (congestive heart failure) (HCC) Compensated clinically, continue Furosemide 20mg  daily  Essential hypertension Controlled, takes Lisinopril 20mg , Metoprolol 25mg  bid, Furosemide 20mg  daily.      Constipation Stable, takes MiraLax daily.     GERD (gastroesophageal reflux disease) Stable, continue Omeprazole 20mg  daily.    Degenerative joint disease Multiple sites, continue Methadone 5mg , Naproxen 220mg  bid.   BPH (benign prostatic hyperplasia) Continue Tamsulosin 0.4mg  and Finasteride 5mg  daily. Stable.   Fall Continue supervision for safety, lack of safety awareness and increased frailty contributory.   Depression Mood is stable, continue Sertraline 50mg  daily.    Edema trace in BLE, continue Furosemide 20mg  daily.     Insomnia Sleeps well, continue Ambien 5mg  nightly.       Family/ staff Communication: continue AL for care assistance.    Labs/tests ordered: none

## 2016-01-13 NOTE — Assessment & Plan Note (Signed)
Continue supervision for safety, lack of safety awareness and increased frailty contributory.

## 2016-01-13 NOTE — Assessment & Plan Note (Signed)
Continue Tamsulosin 0.4mg and Finasteride 5mg daily. Stable.  

## 2016-01-13 NOTE — Assessment & Plan Note (Signed)
Heart rate is in control, continue Metoprolol 25mg bid.  

## 2016-01-13 NOTE — Assessment & Plan Note (Signed)
Sleeps well, continue Ambien 5mg  nightly.

## 2016-01-13 NOTE — Assessment & Plan Note (Signed)
Stable, continue Omeprazole 20mg daily.  

## 2016-01-13 NOTE — Assessment & Plan Note (Signed)
Controlled, takes Lisinopril 20mg, Metoprolol 25mg bid, Furosemide 20mg daily.   

## 2016-01-26 ENCOUNTER — Other Ambulatory Visit: Payer: Self-pay | Admitting: *Deleted

## 2016-01-26 MED ORDER — METHADONE HCL 5 MG PO TABS: ORAL_TABLET | ORAL | 0 refills | Status: DC

## 2016-01-26 NOTE — Telephone Encounter (Signed)
[facility]

## 2016-02-15 ENCOUNTER — Encounter: Payer: Self-pay | Admitting: Nurse Practitioner

## 2016-02-15 ENCOUNTER — Non-Acute Institutional Stay: Payer: Medicare Other | Admitting: Nurse Practitioner

## 2016-02-15 DIAGNOSIS — I10 Essential (primary) hypertension: Secondary | ICD-10-CM | POA: Diagnosis not present

## 2016-02-15 DIAGNOSIS — N400 Enlarged prostate without lower urinary tract symptoms: Secondary | ICD-10-CM | POA: Diagnosis not present

## 2016-02-15 DIAGNOSIS — I4719 Other supraventricular tachycardia: Secondary | ICD-10-CM

## 2016-02-15 DIAGNOSIS — K5900 Constipation, unspecified: Secondary | ICD-10-CM

## 2016-02-15 DIAGNOSIS — M159 Polyosteoarthritis, unspecified: Secondary | ICD-10-CM

## 2016-02-15 DIAGNOSIS — R609 Edema, unspecified: Secondary | ICD-10-CM | POA: Diagnosis not present

## 2016-02-15 DIAGNOSIS — K219 Gastro-esophageal reflux disease without esophagitis: Secondary | ICD-10-CM | POA: Diagnosis not present

## 2016-02-15 DIAGNOSIS — F320 Major depressive disorder, single episode, mild: Secondary | ICD-10-CM

## 2016-02-15 DIAGNOSIS — G4700 Insomnia, unspecified: Secondary | ICD-10-CM

## 2016-02-15 DIAGNOSIS — G2581 Restless legs syndrome: Secondary | ICD-10-CM | POA: Diagnosis not present

## 2016-02-15 DIAGNOSIS — M150 Primary generalized (osteo)arthritis: Secondary | ICD-10-CM | POA: Diagnosis not present

## 2016-02-15 DIAGNOSIS — I5020 Unspecified systolic (congestive) heart failure: Secondary | ICD-10-CM | POA: Diagnosis not present

## 2016-02-15 DIAGNOSIS — M5440 Lumbago with sciatica, unspecified side: Secondary | ICD-10-CM

## 2016-02-15 DIAGNOSIS — I2510 Atherosclerotic heart disease of native coronary artery without angina pectoris: Secondary | ICD-10-CM

## 2016-02-15 DIAGNOSIS — I471 Supraventricular tachycardia: Secondary | ICD-10-CM | POA: Diagnosis not present

## 2016-02-15 NOTE — Assessment & Plan Note (Signed)
Still pain in lower back, will increase Methadone to 5mg  am, 10mg  pm, continue [place] 220mg  bid, Tylenol and Norco prn.

## 2016-02-15 NOTE — Assessment & Plan Note (Signed)
Controlled, takes Lisinopril 20mg, Metoprolol 25mg bid, Furosemide 20mg daily.   

## 2016-02-15 NOTE — Progress Notes (Signed)
Location:  [facility] Room Number: 4 Place of Service:  ALF [phone]) Provider:  Wilmore Holsomback, Manxie  NP  Murray Hodgkins, MD  Patient Care Team: Kimber Relic, MD as PCP - General (Internal Medicine) [facility] Ilya Ess Johnney Ou, NP as Nurse Practitioner (Nurse Practitioner) Lyn Records, MD as Consulting Physician (Cardiology) Barron Alvine, MD as Consulting Physician (Urology) Francisca December, MD (Inactive) as Attending Physician (Cardiology) Leta Speller, MD as Consulting Physician (Dermatology) Elise Benne, MD as Consulting Physician (Ophthalmology)  Extended Emergency Contact Information Primary Emergency Contact: Mortimore,Janet Address: [address]          [place], [place] [address] [place] of [place] Home Phone: [phone] Mobile Phone: [phone] Relation: Daughter  Code Status:  DNR Goals of care: Advanced Directive information Advanced Directives 02/15/2016  Does patient have an advance directive? -  Type of Advance Directive Out of facility DNR (pink MOST or yellow form)  Does patient want to make changes to advanced directive? -  Copy of advanced directive(s) in chart? -  Pre-existing out of facility DNR order (yellow form or pink MOST form) -     Chief Complaint  Patient presents with  . Medical Management of Chronic Issues    HPI:  Pt is a 82 y.o. male seen today for medical management of chronic diseases.     Hx of HTN, controlled on Lisinopril, Metoprolol, and Furosemide, trace edema in ankles has no change, sleeps with aid of Ambien, his mood is table while taking Sertraline 50mg , not constipation while on MiraLax daily. Hx of GERD controlled symptoms with Omeprazole. Chronic back pain is better managed with Methadone 5mg  bid, Norco prn, Naproxen 220mg  bid. He is stable in AL setting.   Past Medical History:  Diagnosis Date  . Abnormality of gait 06/11/2012  . Alcohol abuse 04/26/2012  . Allergy, unspecified not elsewhere  classified 04/28/2012  . Atrial fibrillation (HCC) 04/28/2012  . BPH (benign prostatic hyperplasia)   . CAD (coronary artery disease)    s/p CABG 2005  . Cardiac pacemaker in situ 04/28/2012  . CHF (congestive heart failure) (HCC) 04/23/2012  . Chronic airway obstruction, not elsewhere classified 04/26/2012  . Congestive heart failure, unspecified 04/28/2012  . Coronary atherosclerosis of native coronary artery 04/28/2012  . Degenerative joint disease 04/21/2012  . Depression    mild per pt  . Edema 04/28/2012  . Essential hypertension 10/17/2012  . Fall 04/21/2012   Prolonged downtime 04/20/2012   . GERD (gastroesophageal reflux disease) 09/13/2012  . Hip pain, right 09/02/2013  . History of rhabdomyolysis   . HTN (hypertension)   . Hyperlipidemia   . Hypertrophy of prostate without urinary obstruction and other lower urinary tract symptoms (LUTS) 04/28/2012  . Ingrowing nail 06/11/2012  . Insomnia 11/15/2012  . Insomnia, unspecified 04/28/2012  . Lumbago   . Macular degeneration   . Major depressive disorder, single episode, unspecified 04/28/2012  . Muscle weakness (generalized) 04/26/2012  . NSTEMI (non-ST elevated myocardial infarction) (HCC) 04/23/2012   Related to the stress of rhabdomyolysis and prolonged downtime prior to admission in a patient with known coronary artery disease   . Osteoarthritis   . Other specified disease of white blood cells 04/26/2012  . Pacemaker 04/21/2012   Near EOL.   . Paroxysmal atrial tachycardia (HCC) 04/21/2012   Brief episodes. Not felt to be an anticoagulation candidate because of age and frailty   . Pericarditis 04/21/2012  . Personal history of fall 04/28/2012  .  Reflux esophagitis 04/28/2012  . Rhabdomyolysis   . Seborrheic eczema 12/03/2012  . Sick sinus syndrome (HCC) 10/14/2003; 10/29/2012   MDT EnPulse implanted by Dr Amil AmenEdmunds for SSS; generator change 10/29/2012 by Dr Johney FrameAllred MDT Jana [employer]ensia pacemaker  . Sporotrichosis    remote  . Synovial cyst of wrist 11/10/2014     Left wrist    . Tachy-brady syndrome (HCC) 04/21/2012   Pacemaker is near EOL   . Unspecified constipation 09/13/2012  . Unspecified glaucoma(365.9) 04/28/2012   Past Surgical History:  Procedure Laterality Date  . ANGIOPLASTY  9/11/190   Dr. Katrinka BlazingSmith  . APPENDECTOMY    . BACK SURGERY  03/11/2001  . bilateral knee arthroplasty  09/28/83; 05/17/1992   right then left  . CARPAL TUNNEL RELEASE Left   . CARPAL TUNNEL RELEASE Right 2007  . CATARACT EXTRACTION Right 10/30/1996  . CORONARY ARTERY BYPASS GRAFT  2005  . CYSTOSCOPY  08/14/85  . ELBOW SURGERY     repair  . EPIDIDYMIS SURGERY  09/17/1985  . HERNIA REPAIR Right 05/19/1976  . PACEMAKER GENERATOR CHANGE  10/29/12   Dr. Johney FrameAllred  . PACEMAKER GENERATOR CHANGE N/A 10/29/2012   Procedure: PACEMAKER GENERATOR CHANGE;  Surgeon: Hillis RangeJames Allred, MD;  [facility];  Service: Cardiovascular;  Laterality: N/A;  . PACEMAKER INSERTION  10/14/2003;10/29/2012   MDT Implanted by Dr Amil AmenEdmunds for SSS; generator change 10/29/2012 by Dr Johney FrameAllred [employer]onic [employer]ensia  . REPLACEMENT TOTAL KNEE BILATERAL  1992 and 1996  . ROTATOR CUFF REPAIR Right 2008  . SHOULDER OPEN ROTATOR CUFF REPAIR Left 02/07/1996  . SIGMOIDOSCOPY  08/26/2001  . TOE SURGERY Right 2005   correction  . TRANSURETHRAL RESECTION OF PROSTATE  07/27/1983  . YAG LASER APPLICATION Right 12/26/1973   eye  . YAG LASER APPLICATION Right 04/30/2001  . YAG LASER APPLICATION Left 05/07/2001    Allergies  Allergen Reactions  . Aleve [Naproxen Sodium]   . Celebrex [Celecoxib]     Lips swelled  . Sulfur     Lips swelled  . Vioxx [Rofecoxib] Other (See Comments)    unknown      Medication List       Accurate as of 02/15/16  4:33 PM. Always use your most recent med list.          acetaminophen 650 MG CR tablet Commonly known as:  TYLENOL Take 650 mg by mouth 2 (two) times daily as needed. For pain   aspirin 81 MG tablet Take 81 mg by mouth daily.   bimatoprost 0.01 % Soln Commonly known as:   LUMIGAN Place 1 drop into both eyes at bedtime.   carbamide peroxide 6.5 % otic solution Commonly known as:  DEBROX Reported on 07/13/2015   diclofenac sodium 1 % Gel Commonly known as:  VOLTAREN Apply four times a day as needed for pain   finasteride 5 MG tablet Commonly known as:  PROSCAR Take one tablet by mouth once daily   fluticasone 50 MCG/ACT nasal spray Commonly known as:  FLONASE Place 1 spray into both nostrils daily.   furosemide 20 MG tablet Commonly known as:  LASIX Take one tablet by mouth once daily. One twice weekly as needed for increased weight 2-3 lbs   GOLD BOND ULTIMATE HEALING Lotn Apply 5 mLs topically 2 (two) times daily.   guaiFENesin 600 MG 12 hr tablet Commonly known as:  MUCINEX Take 600 mg by mouth 2 (two) times daily.   ICAPS PO Take by mouth. Take one daily  KLOR-CON M10 10 MEQ tablet Generic drug:  potassium chloride Take 1 tablet by mouth twice weekly as needed with Lasix   lidocaine 4 % cream Commonly known as:  ASPERCREME W/LIDOCAINE Apply morning and evening to painful area of the lower back   lisinopril 20 MG tablet Commonly known as:  PRINIVIL,ZESTRIL Take one tablet daily for blood pressure   loratadine 10 MG tablet Commonly known as:  CLARITIN Take 10 mg by mouth daily. Reported on 07/13/2015   methadone 5 MG tablet Commonly known as:  DOLOPHINE Take 1 tablet every 12 hours to relieve pain   metoprolol tartrate 25 MG tablet Commonly known as:  LOPRESSOR Take 25 mg by mouth 2 (two) times daily.   naproxen sodium 220 MG tablet Commonly known as:  ANAPROX Take 220 mg by mouth 2 (two) times daily.   nitroGLYCERIN 0.4 MG SL tablet Commonly known as:  NITROSTAT Place 0.4 mg under the tongue every 5 (five) minutes as needed. Reported on 07/13/2015   omeprazole 20 MG capsule Commonly known as:  PRILOSEC Take 20 mg by mouth daily.   Polyethyl Glycol-Propyl Glycol 0.4-0.3 % Soln Apply to eye. One drop both eyes twice  daily   polyethylene glycol packet Commonly known as:  MIRALAX / GLYCOLAX Take 17 g by mouth daily. Mix with 8 ounces of water or juice and take by mouth every other day for constipation.   REFRESH PLUS 0.5 % Soln Generic drug:  carboxymethylcellulose 1 drop. Both eyes four times daily as needed for dry eyes   sertraline 50 MG tablet Commonly known as:  ZOLOFT One daily to help depression   tamsulosin 0.4 MG Caps capsule Commonly known as:  FLOMAX Take 0.4 mg by mouth daily.   zolpidem 5 MG tablet Commonly known as:  AMBIEN Take one tablet by mouth at bedtime for rest       Review of Systems  Constitutional: Negative.  Negative for activity change, appetite change, fatigue and unexpected weight change.  HENT: Negative.  Negative for hearing loss, sinus pressure and sore throat.   Eyes:       Hx macular degeneration. Receiving injections every 6-8 weeks.  Respiratory: Positive for cough. Negative for apnea, chest tightness, shortness of breath, wheezing and stridor.   Cardiovascular: Positive for leg swelling. Negative for chest pain and palpitations.  Gastrointestinal: Negative for abdominal distention, abdominal pain, nausea and vomiting.       Flatulence  Endocrine: Negative.   Genitourinary: Negative for decreased urine volume, frequency and urgency.       BPH.  Musculoskeletal: Positive for back pain and gait problem (uunstable. uses walker). Negative for neck pain and neck stiffness.       X-ray reports from 2009 and CT of the lumbar spine shw this patient has foraminal stenoses and  spinal stenosis now. There is multiple intervertebral degenerative disc disease. He has had steroid injections at [facility], Dr. Naaman Plummer. Some pain radiates down the right leg. He is now comfortable moving around using his 4 wheel walker. He is able to get in bed and lay down comfortably overnight.  Skin:       Dry. Itching on dorsum of hands.  Allergic/Immunologic:  Negative.   Hematological: Negative.   Psychiatric/Behavioral: Positive for sleep disturbance (insomnia. Ambien helps.).    Immunization History  Administered Date(s) Administered  . Influenza Whole 02/02/2013  . Influenza-Unspecified 01/29/2014, 01/28/2015, 02/03/2016  . PPD Test 05/09/2012  . Pneumococcal Polysaccharide-23 04/24/2009  . Tdap  04/21/2012  . Zoster 04/25/2007   Pertinent  Health Maintenance Due  Topic Date Due  . PNA vac Low Risk Adult (2 of 2 - PCV13) 04/24/2010  . INFLUENZA VACCINE  Completed   Fall Risk  12/14/2015 07/13/2015 12/15/2014 08/04/2014  Falls in the past year? Yes No No No  Number falls in past yr: 1 - - -  Injury with Fall? No - - -  Risk for fall due to : - - - History of fall(s);Impaired balance/gait   Functional Status Survey:    Vitals:   02/15/16 1049  BP: 137/66  Pulse: 62  Resp: 18  Temp: 97.9 F (36.6 C)  Weight: 185 lb (83.9 kg)  Height: 5\' 8"  (1.727 m)   Body mass index is 28.13 kg/m. Physical Exam  Constitutional: He is oriented to person, place, and time. He appears well-nourished. No distress.  Frail  HENT:  Head: Normocephalic and atraumatic.  Right Ear: External ear normal.  Left Ear: External ear normal.  Nose: Nose normal.  Mouth/Throat: Oropharynx is clear and moist.  Mild decrease in hearing  Eyes: Conjunctivae and EOM are normal. Pupils are equal, round, and reactive to light.  Neck: No JVD present. No tracheal deviation present. No thyromegaly present.  Cardiovascular: Normal rate, regular rhythm and intact distal pulses.  Exam reveals no gallop and no friction rub.   No murmur heard. Pulmonary/Chest: Effort normal. No respiratory distress. He has no wheezes. He has rales (Right chest).  Abdominal: He exhibits no distension and no mass. There is no tenderness.  Large upper diastasis recti  Musculoskeletal: Normal range of motion. He exhibits edema (1+ bipedal). He exhibits no tenderness.  Using  walker. Bilateral knee prosthesis. Tender lumbar spine and shading towards the left side of the small of the back. BLE trace edema  Lymphadenopathy:    He has no cervical adenopathy.  Neurological: He is alert and oriented to person, place, and time. No cranial nerve deficit. Coordination normal.  Diminished sensation to vibration in the right foot.  Skin: Skin is dry. No rash noted. No erythema. No pallor.  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 Multiple SK.  Psychiatric: He has a normal mood and affect. His behavior is normal. Judgment and thought content normal.    Labs reviewed:  Recent Labs  11/11/15  NA 136*  K 4.9  BUN 36*  CREATININE 1.4*    Recent Labs  11/11/15  AST 13*  ALT 10  ALKPHOS 57    Recent Labs  11/11/15  WBC 5.9  HGB 11.9*  HCT 35*  PLT 157   Lab Results  Component Value Date   TSH 1.10 10/19/2014   Lab Results  Component Value Date   HGBA1C 6.1 (H) 04/22/2012   Lab Results  Component Value Date   CHOL 149 04/22/2012   HDL 35 (L) 04/22/2012   LDLCALC 96 04/22/2012   TRIG 89 04/22/2012   CHOLHDL 4.3 04/22/2012    Significant Diagnostic Results in last 30 days:  No results found.  Assessment/Plan Paroxysmal atrial tachycardia Heart rate is in control, continue Metoprolol 25mg  bid.    CAD (coronary artery disease) CABG 2005, ASA 81mg , NGT prn  CHF (congestive heart failure) (HCC) Compensated clinically, continue Furosemide 20mg  daily  Essential hypertension Controlled, takes Lisinopril 20mg , Metoprolol 25mg  bid, Furosemide 20mg  daily.     Constipation Stable, takes MiraLax daily.   GERD (gastroesophageal reflux disease) Stable, continue Omeprazole 20mg   daily.  Degenerative joint disease Multiple sites, continue Methadone 5mg , Naproxen 220mg  bid, Tylenol prn  BPH (benign prostatic hyperplasia) Continue Tamsulosin 0.4mg  and Finasteride 5mg  daily. Stable.    Depression Mood is stable, continue Sertraline 50mg  daily.    Edema trace in BLE, continue Furosemide 20mg  daily.   Insomnia Sleeps well, continue Ambien 5mg  nightly.    Restless leg Stable, able to sleep at night while on Ambien.   Lumbago Still pain in lower back, will increase Methadone to 5mg  am, 10mg  pm, continue Alevel 220mg  bid, Tylenol and Norco prn.      Family/ staff Communication: continue AL for care assistance  Labs/tests ordered:  none

## 2016-02-15 NOTE — Assessment & Plan Note (Signed)
Multiple sites, continue Methadone 5mg , Naproxen 220mg  bid, Tylenol prn

## 2016-02-15 NOTE — Assessment & Plan Note (Signed)
CABG 2005, ASA 81mg , NGT prn

## 2016-02-15 NOTE — Assessment & Plan Note (Signed)
Mood is stable, continue Sertraline 50mg daily.  

## 2016-02-15 NOTE — Assessment & Plan Note (Signed)
Sleeps well, continue Ambien 5mg  nightly.

## 2016-02-15 NOTE — Assessment & Plan Note (Signed)
trace in BLE, continue Furosemide 20mg daily. 

## 2016-02-15 NOTE — Assessment & Plan Note (Signed)
Stable, able to sleep at night while on Ambien.  

## 2016-02-15 NOTE — Assessment & Plan Note (Signed)
Continue Tamsulosin 0.4mg and Finasteride 5mg daily. Stable.  

## 2016-02-15 NOTE — Assessment & Plan Note (Signed)
Stable, continue Omeprazole 20mg daily.  

## 2016-02-15 NOTE — Assessment & Plan Note (Signed)
Heart rate is in control, continue Metoprolol 25mg bid.  

## 2016-02-15 NOTE — Assessment & Plan Note (Signed)
Compensated clinically, continue Furosemide 20mg daily.  

## 2016-02-15 NOTE — Assessment & Plan Note (Signed)
Stable,  takes MiraLax daily.  

## 2016-02-22 ENCOUNTER — Encounter: Payer: Self-pay | Admitting: Nurse Practitioner

## 2016-02-22 ENCOUNTER — Non-Acute Institutional Stay: Payer: Medicare Other | Admitting: Nurse Practitioner

## 2016-02-22 DIAGNOSIS — I5020 Unspecified systolic (congestive) heart failure: Secondary | ICD-10-CM

## 2016-02-22 DIAGNOSIS — I2510 Atherosclerotic heart disease of native coronary artery without angina pectoris: Secondary | ICD-10-CM

## 2016-02-22 DIAGNOSIS — R040 Epistaxis: Secondary | ICD-10-CM | POA: Diagnosis not present

## 2016-02-22 DIAGNOSIS — K5900 Constipation, unspecified: Secondary | ICD-10-CM | POA: Diagnosis not present

## 2016-02-22 DIAGNOSIS — F320 Major depressive disorder, single episode, mild: Secondary | ICD-10-CM | POA: Diagnosis not present

## 2016-02-22 DIAGNOSIS — F5101 Primary insomnia: Secondary | ICD-10-CM

## 2016-02-22 DIAGNOSIS — I471 Supraventricular tachycardia: Secondary | ICD-10-CM

## 2016-02-22 DIAGNOSIS — R609 Edema, unspecified: Secondary | ICD-10-CM | POA: Diagnosis not present

## 2016-02-22 DIAGNOSIS — I10 Essential (primary) hypertension: Secondary | ICD-10-CM | POA: Diagnosis not present

## 2016-02-22 DIAGNOSIS — N400 Enlarged prostate without lower urinary tract symptoms: Secondary | ICD-10-CM | POA: Diagnosis not present

## 2016-02-22 DIAGNOSIS — M150 Primary generalized (osteo)arthritis: Secondary | ICD-10-CM | POA: Diagnosis not present

## 2016-02-22 DIAGNOSIS — M159 Polyosteoarthritis, unspecified: Secondary | ICD-10-CM

## 2016-02-22 DIAGNOSIS — K219 Gastro-esophageal reflux disease without esophagitis: Secondary | ICD-10-CM

## 2016-02-22 NOTE — Assessment & Plan Note (Signed)
Continue Tamsulosin 0.4mg  and Finasteride 5mg  daily. Stable.

## 2016-02-22 NOTE — Assessment & Plan Note (Signed)
Sleeps well, continue Ambien 5mg  nightly. My recommendation is to dc Ambien since the patient c/o sleepiness and continue Methadone 5mg  am 10mg  pm for better pain control, but the patient desires to continue Ambien nightly, reduce Methadone to 5mg  bid in setting of c/o uncontrolled back pain.

## 2016-02-22 NOTE — Progress Notes (Signed)
Location:  [facility] Room Number: 4 Place of Service:  ALF [phone]) Provider:  Eila Runyan, Manxie  NP  Murray Hodgkins, MD  Patient Care Team: Kimber Relic, MD as PCP - General (Internal Medicine) [facility] Ar[place]the Klute Johnney Ou, NP as Nurse Practitioner (Nurse Practitioner) Lyn Records, MD as Consulting Physician (Cardiology) Barron Alvine, MD as Consulting Physician (Urology) Francisca December, MD (Inactive) as Attending Physician (Cardiology) Leta Speller, MD as Consulting Physician (Dermatology) Elise Benne, MD as Consulting Physician (Ophthalmology)  Extended Emergency Contact Information Primary Emergency Contact: Mortimore,Janet Address: [address]          [place], [place] [address] [place] of [place] Home Phone: [phone] Mobile Phone: [phone] Relation: Daughter  Code Status:  DNR Goals of care: Advanced Directive information Advanced Directives 02/22/2016  Does patient have an advance directive? Yes  Type of Advance Directive Out of facility DNR (pink MOST or yellow form);Healthcare Power of [place];Living will  Does patient want to make changes to advanced directive? No - Patient declined  Copy of advanced directive(s) in chart? Yes  Pre-existing out of facility DNR order (yellow form or pink MOST form) -     Chief Complaint  Patient presents with  . Acute Visit    nose bleed and  regarding methadone medication    HPI:  Pt is a 82 y.o. male seen today for an acute visit for 02/19/16 nose bleed, left nostril, stopped by pressure dressing    Hx of HTN, controlled on Lisinopril, Metoprolol, and Furosemide, trace edema in ankles has no change, sleeps with aid of Ambien, his mood is table while taking Sertraline 50mg , not constipation while on MiraLax daily. Hx of GERD controlled symptoms with Omeprazole. Chronic back pain is better managed with Methadone 5mg  bid, Norco prn, Naproxen 220mg  bid. He is stable in AL setting.   Past  Medical History:  Diagnosis Date  . Abnormality of gait 06/11/2012  . Alcohol abuse 04/26/2012  . Allergy, unspecified not elsewhere classified 04/28/2012  . Atrial fibrillation (HCC) 04/28/2012  . BPH (benign prostatic hyperplasia)   . CAD (coronary artery disease)    s/p CABG 2005  . Cardiac pacemaker in situ 04/28/2012  . CHF (congestive heart failure) (HCC) 04/23/2012  . Chronic airway obstruction, not elsewhere classified 04/26/2012  . Congestive heart failure, unspecified 04/28/2012  . Coronary atherosclerosis of native coronary artery 04/28/2012  . Degenerative joint disease 04/21/2012  . Depression    mild per pt  . Edema 04/28/2012  . Essential hypertension 10/17/2012  . Fall 04/21/2012   Prolonged downtime 04/20/2012   . GERD (gastroesophageal reflux disease) 09/13/2012  . Hip pain, right 09/02/2013  . History of rhabdomyolysis   . HTN (hypertension)   . Hyperlipidemia   . Hypertrophy of prostate without urinary obstruction and other lower urinary tract symptoms (LUTS) 04/28/2012  . Ingrowing nail 06/11/2012  . Insomnia 11/15/2012  . Insomnia, unspecified 04/28/2012  . Lumbago   . Macular degeneration   . Major depressive disorder, single episode, unspecified 04/28/2012  . Muscle weakness (generalized) 04/26/2012  . NSTEMI (non-ST elevated myocardial infarction) (HCC) 04/23/2012   Related to the stress of rhabdomyolysis and prolonged downtime prior to admission in a patient with known coronary artery disease   . Osteoarthritis   . Other specified disease of white blood cells 04/26/2012  . Pacemaker 04/21/2012   Near EOL.   . Paroxysmal atrial tachycardia (HCC) 04/21/2012   Brief episodes. Not felt to be an  anticoagulation candidate because of age and frailty   . Pericarditis 04/21/2012  . Personal history of fall 04/28/2012  . Reflux esophagitis 04/28/2012  . Rhabdomyolysis   . Seborrheic eczema 12/03/2012  . Sick sinus syndrome (HCC) 10/14/2003; 10/29/2012   MDT EnPulse implanted by Dr Amil Amen for  SSS; generator change 10/29/2012 by Dr Johney Frame MDT Jana Half pacemaker  . Sporotrichosis    remote  . Synovial cyst of wrist 11/10/2014   Left wrist    . Tachy-brady syndrome (HCC) 04/21/2012   Pacemaker is near EOL   . Unspecified constipation 09/13/2012  . Unspecified glaucoma(365.9) 04/28/2012   Past Surgical History:  Procedure Laterality Date  . ANGIOPLASTY  9/11/190   Dr. Katrinka Blazing  . APPENDECTOMY    . BACK SURGERY  03/11/2001  . bilateral knee arthroplasty  09/28/83; 05/17/1992   right then left  . CARPAL TUNNEL RELEASE Left   . CARPAL TUNNEL RELEASE Right 2007  . CATARACT EXTRACTION Right 10/30/1996  . CORONARY ARTERY BYPASS GRAFT  2005  . CYSTOSCOPY  08/14/85  . ELBOW SURGERY     repair  . EPIDIDYMIS SURGERY  09/17/1985  . HERNIA REPAIR Right 05/19/1976  . PACEMAKER GENERATOR CHANGE  10/29/12   Dr. Johney Frame  . PACEMAKER GENERATOR CHANGE N/A 10/29/2012   Procedure: PACEMAKER GENERATOR CHANGE;  Surgeon: Hillis Range, MD;  Location: [facility] CATH LAB;  Service: Cardiovascular;  Laterality: N/A;  . PACEMAKER INSERTION  10/14/2003;10/29/2012   MDT Implanted by Dr Amil Amen for SSS; generator change 10/29/2012 by Dr Johney Frame [employer] [place]  . REPLACEMENT TOTAL KNEE BILATERAL  1992 and 1996  . ROTATOR CUFF REPAIR Right 2008  . SHOULDER OPEN ROTATOR CUFF REPAIR Left 02/07/1996  . SIGMOIDOSCOPY  08/26/2001  . TOE SURGERY Right 2005   correction  . TRANSURETHRAL RESECTION OF PROSTATE  07/27/1983  . YAG LASER APPLICATION Right 12/26/1973   eye  . YAG LASER APPLICATION Right 04/30/2001  . YAG LASER APPLICATION Left 05/07/2001    Allergies  Allergen Reactions  . Aleve [Naproxen Sodium]   . Celebrex [Celecoxib]     Lips swelled  . Sulfur     Lips swelled  . Vioxx [Rofecoxib] Other (See Comments)    unknown      Medication List       Accurate as of 02/22/16  3:49 PM. Always use your most recent med list.          acetaminophen 650 MG CR tablet Commonly known as:  TYLENOL Take 650 mg by mouth 2 (two)  times daily as needed. For pain   aspirin 81 MG tablet Take 81 mg by mouth daily.   bimatoprost 0.01 % Soln Commonly known as:  LUMIGAN Place 1 drop into both eyes at bedtime.   carbamide peroxide 6.5 % otic solution Commonly known as:  DEBROX Reported on 07/13/2015   diclofenac sodium 1 % Gel Commonly known as:  VOLTAREN Apply four times a day as needed for pain   finasteride 5 MG tablet Commonly known as:  PROSCAR Take one tablet by mouth once daily   fluticasone 50 MCG/ACT nasal spray Commonly known as:  FLONASE Place 1 spray into both nostrils daily.   furosemide 20 MG tablet Commonly known as:  LASIX Take one tablet by mouth once daily. One twice weekly as needed for increased weight 2-3 lbs   GOLD BOND ULTIMATE HEALING Lotn Apply 5 mLs topically 2 (two) times daily.   guaiFENesin 600 MG 12 hr tablet Commonly known as:  MUCINEX Take  600 mg by mouth 2 (two) times daily.   ICAPS PO Take by mouth. Take one daily   KLOR-CON M10 10 MEQ tablet Generic drug:  potassium chloride Take 1 tablet by mouth twice weekly as needed with Lasix   lidocaine 4 % cream Commonly known as:  ASPERCREME W/LIDOCAINE Apply morning and evening to painful area of the lower back   lisinopril 20 MG tablet Commonly known as:  PRINIVIL,ZESTRIL Take one tablet daily for blood pressure   loratadine 10 MG tablet Commonly known as:  CLARITIN Take 10 mg by mouth daily. Reported on 07/13/2015   methadone 5 MG tablet Commonly known as:  DOLOPHINE Take 1 tablet every 12 hours to relieve pain   metoprolol tartrate 25 MG tablet Commonly known as:  LOPRESSOR Take 25 mg by mouth 2 (two) times daily.   naproxen sodium 220 MG tablet Commonly known as:  ANAPROX Take 220 mg by mouth 2 (two) times daily.   nitroGLYCERIN 0.4 MG SL tablet Commonly known as:  NITROSTAT Place 0.4 mg under the tongue every 5 (five) minutes as needed. Reported on 07/13/2015   omeprazole 20 MG capsule Commonly known  as:  PRILOSEC Take 20 mg by mouth daily.   Polyethyl Glycol-Propyl Glycol 0.4-0.3 % Soln Apply to eye. One drop both eyes twice daily   polyethylene glycol packet Commonly known as:  MIRALAX / GLYCOLAX Take 17 g by mouth daily. Mix with 8 ounces of water or juice and take by mouth every other day for constipation.   REFRESH PLUS 0.5 % Soln Generic drug:  carboxymethylcellulose 1 drop. Both eyes four times daily as needed for dry eyes   sertraline 50 MG tablet Commonly known as:  ZOLOFT One daily to help depression   tamsulosin 0.4 MG Caps capsule Commonly known as:  FLOMAX Take 0.4 mg by mouth daily.   zolpidem 5 MG tablet Commonly known as:  AMBIEN Take one tablet by mouth at bedtime for rest       Review of Systems  Constitutional: Negative.  Negative for activity change, appetite change, fatigue and unexpected weight change.  HENT: Negative.  Negative for hearing loss, sinus pressure and sore throat.        02/19/16 nose bleed, left nostril, stopped by pressure dressing  Eyes:       Hx macular degeneration. Receiving injections every 6-8 weeks.  Respiratory: Positive for cough. Negative for apnea, chest tightness, shortness of breath, wheezing and stridor.   Cardiovascular: Positive for leg swelling. Negative for chest pain and palpitations.  Gastrointestinal: Negative for abdominal distention, abdominal pain, nausea and vomiting.       Flatulence  Endocrine: Negative.   Genitourinary: Negative for decreased urine volume, frequency and urgency.       BPH.  Musculoskeletal: Positive for back pain and gait problem (uunstable. uses walker). Negative for neck pain and neck stiffness.       X-ray reports from 2009 and CT of the lumbar spine shw this patient has foraminal stenoses and  spinal stenosis now. There is multiple intervertebral degenerative disc disease. He has had steroid injections at [facility], Dr. Naaman Plummer. Some pain radiates down the  right leg. He is now comfortable moving around using his 4 wheel walker. He is able to get in bed and lay down comfortably overnight.  Skin:       Dry. Itching on dorsum of hands.  Allergic/Immunologic: Negative.   Hematological: Negative.   Psychiatric/Behavioral: Positive for sleep disturbance (insomnia.  Ambien helps.).    Immunization History  Administered Date(s) Administered  . Influenza Whole 02/02/2013  . Influenza-Unspecified 01/29/2014, 01/28/2015, 02/03/2016  . PPD Test 05/09/2012  . Pneumococcal Polysaccharide-23 04/24/2009  . Tdap 04/21/2012  . Zoster 04/25/2007   Pertinent  Health Maintenance Due  Topic Date Due  . PNA vac Low Risk Adult (2 of 2 - PCV13) 04/24/2010  . INFLUENZA VACCINE  Completed   Fall Risk  12/14/2015 07/13/2015 12/15/2014 08/04/2014  Falls in the past year? Yes No No No  Number falls in past yr: 1 - - -  Injury with Fall? No - - -  Risk for fall due to : - - - History of fall(s);Impaired balance/gait   Functional Status Survey:    Vitals:   02/22/16 1304  BP: 120/63  Pulse: 61  Resp: 20  Temp: 97 F (36.1 C)  Weight: 185 lb (83.9 kg)  Height: 5\' 8"  (1.727 m)   Body mass index is 28.13 kg/m. Physical Exam  Constitutional: He is oriented to person, place, and time. He appears well-nourished. No distress.  Frail  HENT:  Head: Normocephalic and atraumatic.  Right Ear: External ear normal.  Left Ear: External ear normal.  Nose: Nose normal.  Mouth/Throat: Oropharynx is clear and moist.  Mild decrease in hearing  Eyes: Conjunctivae and EOM are normal. Pupils are equal, round, and reactive to light.  Neck: No JVD present. No tracheal deviation present. No thyromegaly present.  Cardiovascular: Normal rate, regular rhythm and intact distal pulses.  Exam reveals no gallop and no friction rub.   No murmur heard. Pulmonary/Chest: Effort normal. No respiratory distress. He has no wheezes. He has rales (Right chest).  Abdominal: He exhibits no  distension and no mass. There is no tenderness.  Large upper diastasis recti  Musculoskeletal: Normal range of motion. He exhibits edema (1+ bipedal). He exhibits no tenderness.  Using walker. Bilateral knee prosthesis. Tender lumbar spine and shading towards the left side of the small of the back. BLE trace edema  Lymphadenopathy:    He has no cervical adenopathy.  Neurological: He is alert and oriented to person, place, and time. No cranial nerve deficit. Coordination normal.  Diminished sensation to vibration in the right foot.  Skin: Skin is dry. No rash noted. No erythema. No pallor.  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 Multiple SK.  Psychiatric: He has a normal mood and affect. His behavior is normal. Judgment and thought content normal.    Labs reviewed:  Recent Labs  11/11/15  NA 136*  K 4.9  BUN 36*  CREATININE 1.4*    Recent Labs  11/11/15  AST 13*  ALT 10  ALKPHOS 57    Recent Labs  11/11/15  WBC 5.9  HGB 11.9*  HCT 35*  PLT 157   Lab Results  Component Value Date   TSH 1.10 10/19/2014   Lab Results  Component Value Date   HGBA1C 6.1 (H) 04/22/2012   Lab Results  Component Value Date   CHOL 149 04/22/2012   HDL 35 (L) 04/22/2012   LDLCALC 96 04/22/2012   TRIG 89 04/22/2012   CHOLHDL 4.3 04/22/2012    Significant Diagnostic Results in last 30 days:  No results found.  Assessment/Plan Left-sided epistaxis 02/19/16 nose bleed, left nostril, stopped by pressure dressing Saline nasal spray R+L nostril tid.   CAD (coronary artery disease) CABG 2005, ASA 81mg , NGT prn, no angina since last visited.  Paroxysmal atrial tachycardia Heart rate is in control, continue Metoprolol 25mg  bid.   CHF (congestive heart failure) (HCC) Compensated clinically, continue Furosemide 20mg  daily  Essential hypertension Controlled, takes Lisinopril 20mg , Metoprolol 25mg  bid, Furosemide 20mg  daily.    Constipation Stable, takes MiraLax daily.   GERD (gastroesophageal reflux disease) Stable, continue Omeprazole 20mg  daily.  Degenerative joint disease Still pain in lower back, c/o sleepiness since increased Methadone to 5mg  am, 10mg  pm, will resume 5mg  bid, continue Alevel 220mg  bid, Tylenol and Norco prn.   BPH (benign prostatic hyperplasia) Continue Tamsulosin 0.4mg  and Finasteride 5mg  daily. Stable.   Depression Mood is stable, continue Sertraline 50mg  daily.   Edema trace in BLE, continue Furosemide 20mg  daily.  Insomnia Sleeps well, continue Ambien 5mg  nightly. My recommendation is to dc Ambien since the patient c/o sleepiness and continue Methadone 5mg  am 10mg  pm for better pain control, but the patient desires to continue Ambien nightly, reduce Methadone to 5mg  bid in setting of c/o uncontrolled back pain.      Family/ staff Communication: continue AL for care assistance.   Labs/tests ordered: none

## 2016-02-22 NOTE — Assessment & Plan Note (Signed)
CABG 2005, ASA 81mg , NGT prn, no angina since last visited.

## 2016-02-22 NOTE — Assessment & Plan Note (Signed)
Still pain in lower back, c/o sleepiness since increased Methadone to 5mg  am, 10mg  pm, will resume 5mg  bid, continue Alevel 220mg  bid, Tylenol and Norco prn.

## 2016-02-22 NOTE — Assessment & Plan Note (Signed)
Stable,  takes MiraLax daily.  

## 2016-02-22 NOTE — Assessment & Plan Note (Signed)
Mood is stable, continue Sertraline 50mg  daily.

## 2016-02-22 NOTE — Assessment & Plan Note (Signed)
02/19/16 nose bleed, left nostril, stopped by pressure dressing Saline nasal spray R+L nostril tid.

## 2016-02-22 NOTE — Assessment & Plan Note (Signed)
trace in BLE, continue Furosemide 20mg  daily.

## 2016-02-22 NOTE — Assessment & Plan Note (Signed)
Heart rate is in control, continue Metoprolol 25mg bid.  

## 2016-02-22 NOTE — Assessment & Plan Note (Signed)
Stable, continue Omeprazole 20mg daily.  

## 2016-02-22 NOTE — Assessment & Plan Note (Signed)
Compensated clinically, continue Furosemide 20mg daily.  

## 2016-02-22 NOTE — Assessment & Plan Note (Signed)
Controlled, takes Lisinopril 20mg, Metoprolol 25mg bid, Furosemide 20mg daily.   

## 2016-03-21 ENCOUNTER — Encounter: Payer: Self-pay | Admitting: Nurse Practitioner

## 2016-03-21 ENCOUNTER — Non-Acute Institutional Stay: Payer: Medicare Other | Admitting: Nurse Practitioner

## 2016-03-21 DIAGNOSIS — I471 Supraventricular tachycardia: Secondary | ICD-10-CM | POA: Diagnosis not present

## 2016-03-21 DIAGNOSIS — I5020 Unspecified systolic (congestive) heart failure: Secondary | ICD-10-CM | POA: Diagnosis not present

## 2016-03-21 DIAGNOSIS — K5900 Constipation, unspecified: Secondary | ICD-10-CM

## 2016-03-21 DIAGNOSIS — K219 Gastro-esophageal reflux disease without esophagitis: Secondary | ICD-10-CM | POA: Diagnosis not present

## 2016-03-21 DIAGNOSIS — F5101 Primary insomnia: Secondary | ICD-10-CM

## 2016-03-21 DIAGNOSIS — R609 Edema, unspecified: Secondary | ICD-10-CM | POA: Diagnosis not present

## 2016-03-21 DIAGNOSIS — G2581 Restless legs syndrome: Secondary | ICD-10-CM | POA: Diagnosis not present

## 2016-03-21 DIAGNOSIS — M5440 Lumbago with sciatica, unspecified side: Secondary | ICD-10-CM

## 2016-03-21 DIAGNOSIS — N400 Enlarged prostate without lower urinary tract symptoms: Secondary | ICD-10-CM | POA: Diagnosis not present

## 2016-03-21 DIAGNOSIS — F320 Major depressive disorder, single episode, mild: Secondary | ICD-10-CM | POA: Diagnosis not present

## 2016-03-21 DIAGNOSIS — I10 Essential (primary) hypertension: Secondary | ICD-10-CM | POA: Diagnosis not present

## 2016-03-21 NOTE — Assessment & Plan Note (Signed)
Sleeps well, continue Methadone 5mg  am 10mg  pm for better pain control,  continue Ambien nightly

## 2016-03-21 NOTE — Assessment & Plan Note (Addendum)
Still pain in lower back, c/o sleepiness since increased Methadone to 5mg  am, 10mg  pm, changed to 5mg  bid, then now taking 5mg  am, 7.5mg  pm again,  continue Alevel 220mg  bid, Tylenol and Norco prn.

## 2016-03-21 NOTE — Assessment & Plan Note (Signed)
Compensated clinically, continue Furosemide 20mg daily.  

## 2016-03-21 NOTE — Progress Notes (Signed)
Location:  [facility] Room Number: 4 Place of Service:  ALF [phone]) Provider:  Raydon Chappuis, Manxie  NP  Murray HodgkinsArthur Green, MD  Patient Care Team: Kimber RelicArthur G Green, MD as PCP - General (Internal Medicine) [facility] Mauri Tolen Johnney OuX Carlinda Ohlson, NP as Nurse Practitioner (Nurse Practitioner) Lyn RecordsHenry W Smith, MD as Consulting Physician (Cardiology) Barron Alvineavid Grapey, MD as Consulting Physician (Urology) Francisca DecemberJohn H. Edmunds, MD (Inactive) as Attending Physician (Cardiology) Leta SpellerFrank Houston, MD as Consulting Physician (Dermatology) Elise BenneSigmund Gould, MD as Consulting Physician (Ophthalmology)  Extended Emergency Contact Information Primary Emergency Contact: Mortimore,Janet Address: [address]          [place], [place] [address] [place] Home Phone: [phone] Mobile Phone: [phone] Relation: Daughter  Code Status:  DNR Goals of care: Advanced Directive information Advanced Directives 03/21/2016  Does Patient Have a Medical Advance Directive? Yes  Type of Advance Directive Out of facility DNR (pink MOST or yellow form);Healthcare Power of [place];Living will  Does patient want to make changes to medical advance directive? No - Patient declined  Copy of Healthcare Power of Attorney in Chart? Yes  Pre-existing out of facility DNR order (yellow form or pink MOST form) -     Chief Complaint  Patient presents with  . Medical Management of Chronic Issues    HPI:  Pt is a [age] y.o. male seen today for medical management of chronic diseases.    Hx of HTN, controlled on Lisinopril, Metoprolol, and Furosemide, trace edema in ankles has no change, sleeps with aid of Ambien, his mood is table while taking Sertraline 50mg , not constipation while on MiraLax qod prn. Hx of GERD controlled symptoms with Omeprazole. Chronic back pain is better managed with Methadone 5mg  am 7.5mg  pm, Norco prn, Naproxen 220mg  bid. He is stable in AL setting.    Past Medical History:  Diagnosis Date  .  Abnormality of gait 06/11/2012  . Alcohol abuse 04/26/2012  . Allergy, unspecified not elsewhere classified 04/28/2012  . Atrial fibrillation (HCC) 04/28/2012  . BPH (benign prostatic hyperplasia)   . CAD (coronary artery disease)    s/p CABG 2005  . Cardiac pacemaker in situ 04/28/2012  . CHF (congestive heart failure) (HCC) 04/23/2012  . Chronic airway obstruction, not elsewhere classified 04/26/2012  . Congestive heart failure, unspecified 04/28/2012  . Coronary atherosclerosis of native coronary artery 04/28/2012  . Degenerative joint disease 04/21/2012  . Depression    mild per pt  . Edema 04/28/2012  . Essential hypertension 10/17/2012  . Fall 04/21/2012   Prolonged downtime 04/20/2012   . GERD (gastroesophageal reflux disease) 09/13/2012  . Hip pain, right 09/02/2013  . History of rhabdomyolysis   . HTN (hypertension)   . Hyperlipidemia   . Hypertrophy of prostate without urinary obstruction and other lower urinary tract symptoms (LUTS) 04/28/2012  . Ingrowing nail 06/11/2012  . Insomnia 11/15/2012  . Insomnia, unspecified 04/28/2012  . Lumbago   . Macular degeneration   . Major depressive disorder, single episode, unspecified 04/28/2012  . Muscle weakness (generalized) 04/26/2012  . NSTEMI (non-ST elevated myocardial infarction) (HCC) 04/23/2012   Related to the stress of rhabdomyolysis and prolonged downtime prior to admission in a patient with known coronary artery disease   . Osteoarthritis   . Other specified disease of white blood cells 04/26/2012  . Pacemaker 04/21/2012   Near EOL.   . Paroxysmal atrial tachycardia (HCC) 04/21/2012   Brief episodes. Not felt to be an anticoagulation candidate because of age and frailty   .  Pericarditis 04/21/2012  . Personal history of fall 04/28/2012  . Reflux esophagitis 04/28/2012  . Rhabdomyolysis   . Seborrheic eczema 12/03/2012  . Sick sinus syndrome (HCC) 10/14/2003; 10/29/2012   MDT EnPulse implanted by Dr Amil AmenEdmunds for SSS; generator change 10/29/2012 by Dr  Johney FrameAllred MDT Jana HalfSensia pacemaker  . Sporotrichosis    remote  . Synovial cyst of wrist 11/10/2014   Left wrist    . Tachy-brady syndrome (HCC) 04/21/2012   Pacemaker is near EOL   . Unspecified constipation 09/13/2012  . Unspecified glaucoma(365.9) 04/28/2012   Past Surgical History:  Procedure Laterality Date  . ANGIOPLASTY  9/11/190   Dr. Katrinka BlazingSmith  . APPENDECTOMY    . BACK SURGERY  03/11/2001  . bilateral knee arthroplasty  09/28/83; 05/17/1992   right then left  . CARPAL TUNNEL RELEASE Left   . CARPAL TUNNEL RELEASE Right 2007  . CATARACT EXTRACTION Right 10/30/1996  . CORONARY ARTERY BYPASS GRAFT  2005  . CYSTOSCOPY  08/14/85  . ELBOW SURGERY     repair  . EPIDIDYMIS SURGERY  09/17/1985  . HERNIA REPAIR Right 05/19/1976  . PACEMAKER GENERATOR CHANGE  10/29/12   Dr. Johney FrameAllred  . PACEMAKER GENERATOR CHANGE N/A 10/29/2012   Procedure: PACEMAKER GENERATOR CHANGE;  Surgeon: Hillis RangeJames Allred, MD;  [facility];  Service: Cardiovascular;  Laterality: N/A;  . PACEMAKER INSERTION  10/14/2003;10/29/2012   MDT Implanted by Dr Amil AmenEdmunds for SSS; generator change 10/29/2012 by Dr Johney FrameAllred Medtronic CueroSensia  . REPLACEMENT TOTAL KNEE BILATERAL  1992 and 1996  . ROTATOR CUFF REPAIR Right 2008  . SHOULDER OPEN ROTATOR CUFF REPAIR Left 02/07/1996  . SIGMOIDOSCOPY  08/26/2001  . TOE SURGERY Right 2005   correction  . TRANSURETHRAL RESECTION OF PROSTATE  07/27/1983  . YAG LASER APPLICATION Right 12/26/1973   eye  . YAG LASER APPLICATION Right 04/30/2001  . YAG LASER APPLICATION Left 05/07/2001    Allergies  Allergen Reactions  . Aleve [Naproxen Sodium]   . Celebrex [Celecoxib]     Lips swelled  . Sulfur     Lips swelled  . Vioxx [Rofecoxib] Other (See Comments)    unknown      Medication List       Accurate as of 03/21/16  5:11 PM. Always use your most recent med list.          acetaminophen 650 MG CR tablet Commonly known as:  TYLENOL Take 650 mg by mouth 2 (two) times daily as needed. For pain     aspirin 81 MG tablet Take 81 mg by mouth daily.   bimatoprost 0.01 % Soln Commonly known as:  LUMIGAN Place 1 drop into both eyes at bedtime.   carbamide peroxide 6.5 % otic solution Commonly known as:  DEBROX Reported on 07/13/2015   DERMAZENE 1 % Crea Generic drug:  Iodoquinol-HC Apply topically daily as needed. For rash in groin area.   diclofenac sodium 1 % Gel Commonly known as:  VOLTAREN Apply four times a day as needed for pain   finasteride 5 MG tablet Commonly known as:  PROSCAR Take one tablet by mouth once daily   fluticasone 50 MCG/ACT nasal spray Commonly known as:  FLONASE Place 1 spray into both nostrils daily.   furosemide 20 MG tablet Commonly known as:  LASIX Take one tablet by mouth once daily. One twice weekly as needed for increased weight 2-3 lbs   GOLD BOND ULTIMATE HEALING Lotn Apply 5 mLs topically 2 (two) times daily.   guaiFENesin  600 MG 12 hr tablet Commonly known as:  MUCINEX Take 600 mg by mouth 2 (two) times daily.   ICAPS PO Take by mouth. Take one daily   KLOR-CON M10 10 MEQ tablet Generic drug:  potassium chloride Take 1 tablet by mouth twice weekly as needed with Lasix   lidocaine 4 % cream Commonly known as:  ASPERCREME W/LIDOCAINE Apply morning and evening to painful area of the lower back   lisinopril 20 MG tablet Commonly known as:  PRINIVIL,ZESTRIL Take one tablet daily for blood pressure   loratadine 10 MG tablet Commonly known as:  CLARITIN Take 10 mg by mouth daily. Reported on 07/13/2015   methadone 5 MG tablet Commonly known as:  DOLOPHINE Take 1 tablet every 12 hours to relieve pain   metoprolol tartrate 25 MG tablet Commonly known as:  LOPRESSOR Take 25 mg by mouth 2 (two) times daily.   naproxen sodium 220 MG tablet Commonly known as:  ANAPROX Take 220 mg by mouth 2 (two) times daily.   nitroGLYCERIN 0.4 MG SL tablet Commonly known as:  NITROSTAT Place 0.4 mg under the tongue every 5 (five) minutes  as needed. Reported on 07/13/2015   omeprazole 20 MG capsule Commonly known as:  PRILOSEC Take 20 mg by mouth daily.   Polyethyl Glycol-Propyl Glycol 0.4-0.3 % Soln Apply to eye. One drop both eyes twice daily   polyethylene glycol packet Commonly known as:  MIRALAX / GLYCOLAX Take 17 g by mouth daily. Mix with 8 ounces of water or juice and take by mouth every other day for constipation.   REFRESH PLUS 0.5 % Soln Generic drug:  carboxymethylcellulose 1 drop. Both eyes four times daily as needed for dry eyes   sertraline 50 MG tablet Commonly known as:  ZOLOFT One daily to help depression   tamsulosin 0.4 MG Caps capsule Commonly known as:  FLOMAX Take 0.4 mg by mouth daily.   zolpidem 5 MG tablet Commonly known as:  AMBIEN Take one tablet by mouth at bedtime for rest       Review of Systems  Constitutional: Negative.  Negative for activity change, appetite change, fatigue and unexpected weight change.  HENT: Negative.  Negative for hearing loss, sinus pressure and sore throat.        02/19/16 nose bleed, left nostril, stopped by pressure dressing  Eyes:       Hx macular degeneration. Receiving injections every 6-8 weeks.  Respiratory: Positive for cough. Negative for apnea, chest tightness, shortness of breath, wheezing and stridor.   Cardiovascular: Positive for leg swelling. Negative for chest pain and palpitations.  Gastrointestinal: Negative for abdominal distention, abdominal pain, nausea and vomiting.       Flatulence  Endocrine: Negative.   Genitourinary: Negative for decreased urine volume, frequency and urgency.       BPH.  Musculoskeletal: Positive for back pain and gait problem (uunstable. uses walker). Negative for neck pain and neck stiffness.       X-ray reports from 2009 and CT of the lumbar spine shw this patient has foraminal stenoses and  spinal stenosis now. There is multiple intervertebral degenerative disc disease. He has had steroid injections at  [facility], Dr. Naaman Plummer. Some pain radiates down the right leg. He is now comfortable moving around using his 4 wheel walker. He is able to get in bed and lay down comfortably overnight.  Skin:       Dry. Itching on dorsum of hands.  Allergic/Immunologic: Negative.  Hematological: Negative.   Psychiatric/Behavioral: Positive for sleep disturbance (insomnia. Ambien helps.).    Immunization History  Administered Date(s) Administered  . Influenza Whole 02/02/2013  . Influenza-Unspecified 01/29/2014, 01/28/2015, 02/03/2016  . PPD Test 05/09/2012  . Pneumococcal Polysaccharide-23 04/24/2009  . Tdap 04/21/2012  . Zoster 04/25/2007   Pertinent  Health Maintenance Due  Topic Date Due  . PNA vac Low Risk Adult (2 of 2 - PCV13) 04/24/2010  . INFLUENZA VACCINE  Completed   Fall Risk  12/14/2015 07/13/2015 12/15/2014 08/04/2014  Falls in the past year? Yes No No No  Number falls in past yr: 1 - - -  Injury with Fall? No - - -  Risk for fall due to : - - - History of fall(s);Impaired balance/gait   Functional Status Survey:    Vitals:   03/21/16 1207  BP: 133/69  Pulse: 61  Resp: 18  Temp: 97.8 F (36.6 C)  SpO2: 99%  Weight: 182 lb 6.4 oz (82.7 kg)  Height: 5\' 8"  (1.727 m)   Body mass index is 27.73 kg/m. Physical Exam  Constitutional: He is oriented to person, place, and time. He appears well-nourished. No distress.  Frail  HENT:  Head: Normocephalic and atraumatic.  Right Ear: External ear normal.  Left Ear: External ear normal.  Nose: Nose normal.  Mouth/Throat: Oropharynx is clear and moist.  Mild decrease in hearing  Eyes: Conjunctivae and EOM are normal. Pupils are equal, round, and reactive to light.  Neck: No JVD present. No tracheal deviation present. No thyromegaly present.  Cardiovascular: Normal rate, regular rhythm and intact distal pulses.  Exam reveals no gallop and no friction rub.   No murmur heard. Pulmonary/Chest: Effort normal.  No respiratory distress. He has no wheezes. He has rales (Right chest).  Abdominal: He exhibits no distension and no mass. There is no tenderness.  Large upper diastasis recti  Musculoskeletal: Normal range of motion. He exhibits edema (1+ bipedal). He exhibits no tenderness.  Using walker. Bilateral knee prosthesis. Tender lumbar spine and shading towards the left side of the small of the back. BLE trace edema  Lymphadenopathy:    He has no cervical adenopathy.  Neurological: He is alert and oriented to person, place, and time. No cranial nerve deficit. Coordination normal.  Diminished sensation to vibration in the right foot.  Skin: Skin is dry. No rash noted. No erythema. No pallor.  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 Multiple SK.  Psychiatric: He has a normal mood and affect. His behavior is normal. Judgment and thought content normal.    Labs reviewed:  Recent Labs  11/11/15  NA 136*  K 4.9  BUN 36*  CREATININE 1.4*    Recent Labs  11/11/15  AST 13*  ALT 10  ALKPHOS 57    Recent Labs  11/11/15  WBC 5.9  HGB 11.9*  HCT 35*  PLT 157   Lab Results  Component Value Date   TSH 1.10 10/19/2014   Lab Results  Component Value Date   HGBA1C 6.1 (H) 04/22/2012   Lab Results  Component Value Date   CHOL 149 04/22/2012   HDL 35 (L) 04/22/2012   LDLCALC 96 04/22/2012   TRIG 89 04/22/2012   CHOLHDL 4.3 04/22/2012    Significant Diagnostic Results in last 30 days:  No results found.  Assessment/Plan Constipation Stable, change MiraLax to qod prn.    Lumbago Still pain in lower back, c/o sleepiness since  increased Methadone to 5mg  am, 10mg  pm, changed to 5mg  bid, then now taking 5mg  am, 7.5mg  pm again,  continue Alevel 220mg  bid, Tylenol and Norco prn.    Paroxysmal atrial tachycardia Heart rate is in control, continue Metoprolol 25mg  bid.    CHF (congestive heart failure) (HCC) Compensated clinically,  continue Furosemide 20mg  daily   Essential hypertension Controlled, takes Lisinopril 20mg , Metoprolol 25mg  bid, Furosemide 20mg  daily.    GERD (gastroesophageal reflux disease) Stable, continue Omeprazole 20mg  daily.   BPH (benign prostatic hyperplasia) Continue Tamsulosin 0.4mg  and Finasteride 5mg  daily. Stable.    Depression Mood is stable, continue Sertraline 50mg  daily.    Edema trace in BLE, continue Furosemide 20mg  daily.   Insomnia Sleeps well, continue Methadone 5mg  am 10mg  pm for better pain control,  continue Ambien nightly   Restless leg Stable, able to sleep at night while on Ambien.      Family/ staff Communication: AL  Labs/tests ordered: none

## 2016-03-21 NOTE — Assessment & Plan Note (Signed)
Stable, able to sleep at night while on Ambien.

## 2016-03-21 NOTE — Assessment & Plan Note (Signed)
Mood is stable, continue Sertraline 50mg daily.  

## 2016-03-21 NOTE — Assessment & Plan Note (Signed)
Controlled, takes Lisinopril 20mg , Metoprolol 25mg  bid, Furosemide 20mg  daily.

## 2016-03-21 NOTE — Assessment & Plan Note (Signed)
Continue Tamsulosin 0.4mg and Finasteride 5mg daily. Stable.  

## 2016-03-21 NOTE — Assessment & Plan Note (Signed)
Stable, continue Omeprazole 20mg daily.  

## 2016-03-21 NOTE — Assessment & Plan Note (Signed)
Heart rate is in control, continue Metoprolol 25mg bid.  

## 2016-03-21 NOTE — Assessment & Plan Note (Signed)
Stable, change MiraLax to qod prn.

## 2016-03-21 NOTE — Assessment & Plan Note (Signed)
trace in BLE, continue Furosemide 20mg daily. 

## 2016-03-23 ENCOUNTER — Ambulatory Visit (INDEPENDENT_AMBULATORY_CARE_PROVIDER_SITE_OTHER): Payer: Medicare Other | Admitting: *Deleted

## 2016-03-23 DIAGNOSIS — I495 Sick sinus syndrome: Secondary | ICD-10-CM

## 2016-03-24 ENCOUNTER — Telehealth: Payer: Self-pay | Admitting: Cardiology

## 2016-03-24 NOTE — Telephone Encounter (Signed)
Confirmed remote transmission w/ pt nurse.   

## 2016-04-04 ENCOUNTER — Encounter: Payer: Self-pay | Admitting: Internal Medicine

## 2016-04-04 ENCOUNTER — Encounter: Payer: Self-pay | Admitting: Cardiology

## 2016-04-04 ENCOUNTER — Non-Acute Institutional Stay: Payer: Medicare Other | Admitting: Internal Medicine

## 2016-04-04 VITALS — BP 110/58 | HR 60 | Temp 97.8°F | Ht 68.0 in | Wt 187.0 lb

## 2016-04-04 DIAGNOSIS — M5440 Lumbago with sciatica, unspecified side: Secondary | ICD-10-CM

## 2016-04-04 DIAGNOSIS — I5020 Unspecified systolic (congestive) heart failure: Secondary | ICD-10-CM | POA: Diagnosis not present

## 2016-04-04 DIAGNOSIS — I2510 Atherosclerotic heart disease of native coronary artery without angina pectoris: Secondary | ICD-10-CM | POA: Diagnosis not present

## 2016-04-04 DIAGNOSIS — I10 Essential (primary) hypertension: Secondary | ICD-10-CM | POA: Diagnosis not present

## 2016-04-04 DIAGNOSIS — R609 Edema, unspecified: Secondary | ICD-10-CM | POA: Diagnosis not present

## 2016-04-04 NOTE — Progress Notes (Signed)
Progress Note     [facility] Room Number: CB63  Place of Service: Clinic (12)     Allergies  Allergen Reactions  . Aleve [Naproxen Sodium]   . Celebrex [Celecoxib]     Lips swelled  . Sulfur     Lips swelled  . Vioxx [Rofecoxib] Other (See Comments)    unknown    Chief Complaint  Patient presents with  . Medical Management of Chronic Issues    4 month medication management CHF, blood pressure, edema.     HPI:  Essential hypertension - controlled  Low back pain with sciatica, sciatica laterality unspecified, unspecified back pain laterality, unspecified chronicity - continues with discomfort when walking.   Systolic congestive heart failure, unspecified congestive heart failure chronicity ([place]) - compensated  Edema, unspecified type - mild and unchanged     Medications: Patient's Medications  New Prescriptions   No medications on file  Previous Medications   AMOXICILLIN (AMOXIL) 500 MG TABLET    Take 500 mg by mouth. Take one tablet one hour prior to surgery   ASPIRIN 81 MG TABLET    Take 81 mg by mouth daily.   BIMATOPROST (LUMIGAN) 0.01 % SOLN    Place 1 drop into both eyes at bedtime.    CARBAMIDE PEROXIDE (DEBROX) 6.5 % OTIC SOLUTION    Reported on 07/13/2015   CARBOXYMETHYLCELLULOSE (REFRESH PLUS) 0.5 % SOLN    1 drop. Both eyes four times daily as needed for dry eyes   DICLOFENAC SODIUM (VOLTAREN) 1 % GEL    Apply four times a day as needed for pain   EMOLLIENT (GOLD BOND ULTIMATE HEALING) LOTN    Apply 5 mLs topically 2 (two) times daily.   FINASTERIDE (PROSCAR) 5 MG TABLET    Take one tablet by mouth once daily   FLUTICASONE (FLONASE) 50 MCG/ACT NASAL SPRAY    Place 1 spray into both nostrils daily.    FUROSEMIDE (LASIX) 20 MG TABLET    Take one tablet by mouth once daily. One twice weekly as needed for increased weight 2-3 lbs   GUAIFENESIN (MUCINEX) 600 MG 12 HR TABLET    Take 600 mg by mouth 2 (two) times daily.    IODOQUINOL-HC  (DERMAZENE) 1 % CREA    Apply topically daily as needed. For rash in groin area.   KLOR-CON M10 10 MEQ TABLET    Take 1 tablet by mouth twice weekly as needed with Lasix   LIDOCAINE (ASPERCREME W/LIDOCAINE) 4 % CREAM    Apply morning and evening to painful area of the lower back   LISINOPRIL (PRINIVIL,ZESTRIL) 20 MG TABLET    Take one tablet daily for blood pressure   LORATADINE (CLARITIN) 10 MG TABLET    Take 10 mg by mouth daily. Reported on 07/13/2015   METHADONE (DOLOPHINE) 5 MG TABLET    Take 5 mg by mouth. Take one 5 mg tablet at 9:00 am, take one 7.5 mg tablet at 9:00 p m   METOPROLOL TARTRATE (LOPRESSOR) 25 MG TABLET    Take 25 mg by mouth 2 (two) times daily.   MULTIPLE VITAMINS-MINERALS (ICAPS PO)    Take by mouth. Take one daily   NAPROXEN SODIUM (ANAPROX) 220 MG TABLET    Take 220 mg by mouth 2 (two) times daily.   NITROGLYCERIN (NITROSTAT) 0.4 MG SL TABLET    Place 0.4 mg under the tongue every 5 (five) minutes as needed. Reported on 07/13/2015   OMEPRAZOLE (  PRILOSEC) 20 MG CAPSULE    Take 20 mg by mouth daily.   POLYETHYL GLYCOL-PROPYL GLYCOL 0.4-0.3 % SOLN    Apply to eye. One drop both eyes twice daily   POLYETHYLENE GLYCOL (MIRALAX / GLYCOLAX) PACKET    Take 17 g by mouth daily. Mix with 8 ounces of water or juice and take by mouth every other day for constipation.   SERTRALINE (ZOLOFT) 50 MG TABLET    One daily to help depression   TAMSULOSIN HCL (FLOMAX) 0.4 MG CAPS    Take 0.4 mg by mouth daily.   ZOLPIDEM (AMBIEN) 5 MG TABLET    Take one tablet by mouth at bedtime for rest  Modified Medications   No medications on file  Discontinued Medications   ACETAMINOPHEN (TYLENOL) 650 MG CR TABLET    Take 650 mg by mouth 2 (two) times daily as needed. For pain   METHADONE (DOLOPHINE) 5 MG TABLET    Take 1 tablet every 12 hours to relieve pain     Review of Systems  Constitutional: Negative.  Negative for activity change, appetite change, fatigue and unexpected weight change.  HENT:  Positive for postnasal drip. Negative for hearing loss, sinus pressure and sore throat.        02/19/16 nose bleed, left nostril, stopped by pressure dressing  Eyes:       Hx macular degeneration. Receiving injections every 6-8 weeks.  Respiratory: Positive for cough. Negative for apnea, chest tightness, shortness of breath, wheezing and stridor.   Cardiovascular: Positive for leg swelling. Negative for chest pain and palpitations.  Gastrointestinal: Negative for abdominal distention, abdominal pain, nausea and vomiting.       Flatulence  Endocrine: Negative.   Genitourinary: Negative for decreased urine volume, frequency and urgency.       BPH.  Musculoskeletal: Positive for back pain and gait problem (uunstable. uses walker). Negative for neck pain and neck stiffness.       X-ray reports from 2009 and CT of the lumbar spine shw this patient has foraminal stenoses and  spinal stenosis now. There is multiple intervertebral degenerative disc disease. He has had steroid injections at [facility], Dr. Laurence Spates. Some pain radiates down the right leg. He is now comfortable moving around using his 4 wheel walker. He is able to get in bed and lay down comfortably overnight.  Skin:       Dry. Itching on dorsum of hands.  Allergic/Immunologic: Negative.   Hematological: Negative.   Psychiatric/Behavioral: Positive for sleep disturbance (insomnia. Ambien helps.).    Vitals:   04/04/16 0842  BP: (!) 110/58  Pulse: 60  Temp: 97.8 F (36.6 C)  TempSrc: Oral  SpO2: 99%  Weight: 187 lb (84.8 kg)  Height: 5' 8"  (1.727 m)   Wt Readings from Last 3 Encounters:  04/04/16 187 lb (84.8 kg)  03/21/16 182 lb 6.4 oz (82.7 kg)  02/22/16 185 lb (83.9 kg)    Body mass index is 28.43 kg/m.  Physical Exam  Constitutional: He is oriented to person, place, and time. He appears well-nourished. No distress.  Frail  HENT:  Head: Normocephalic and atraumatic.  Right Ear: External ear  normal.  Left Ear: External ear normal.  Nose: Nose normal.  Mouth/Throat: Oropharynx is clear and moist.  Mild decrease in hearing  Eyes: Conjunctivae and EOM are normal. Pupils are equal, round, and reactive to light.  Neck: No JVD present. No tracheal deviation present. No thyromegaly present.  Cardiovascular: Normal rate,  regular rhythm and intact distal pulses.  Exam reveals no gallop and no friction rub.   No murmur heard. Pulmonary/Chest: Effort normal. No respiratory distress. He has no wheezes. He has rales (Right chest).  Abdominal: He exhibits no distension and no mass. There is no tenderness.  Large upper diastasis recti  Musculoskeletal: Normal range of motion. He exhibits edema (1+ bipedal). He exhibits no tenderness.  Using walker. Bilateral knee prosthesis. Tender lumbar spine and shading towards the left side of the small of the back. BLE trace edema  Lymphadenopathy:    He has no cervical adenopathy.  Neurological: He is alert and oriented to person, place, and time. No cranial nerve deficit. Coordination normal.  Diminished sensation to vibration in the right foot.  Skin: Skin is dry. No rash noted. No erythema. No pallor.  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 Multiple SK.  Psychiatric: He has a normal mood and affect. His behavior is normal. Judgment and thought content normal.     Labs reviewed: Lab Summary Latest Ref Rng & Units 11/11/2015  Hemoglobin 13.5 - 17.5 g/dL 11.9(A)  Hematocrit 41 - 53 % 35(A)  White count 10:3/mL 5.9  Platelet count 150 - 399 K/L 157  Sodium 137 - 147 mmol/L 136(A)  Potassium 3.4 - 5.3 mmol/L 4.9  Calcium - (None)  Phosphorus - (None)  Creatinine 0.6 - 1.3 mg/dL 1.4(A)  AST 14 - 40 U/L 13(A)  Alk Phos 25 - 125 U/L 57  Bilirubin - (None)  Glucose mg/dL 106  Cholesterol - (None)  HDL cholesterol - (None)  Triglycerides - (None)  LDL Direct - (None)  LDL Calc - (None)  Total  protein - (None)  Albumin - (None)  Some recent data might be hidden   Lab Results  Component Value Date   TSH 1.10 10/19/2014   Lab Results  Component Value Date   BUN 36 (A) 11/11/2015   BUN 32 (A) 10/19/2014   BUN 36 (A) 10/20/2013   Lab Results  Component Value Date   CREATININE 1.4 (A) 11/11/2015   CREATININE 1.2 10/19/2014   CREATININE 1.3 10/20/2013   Lab Results  Component Value Date   HGBA1C 6.1 (H) 04/22/2012       Assessment/Plan  1. Essential hypertension controlled  2. Low back pain with sciatica, sciatica laterality unspecified, unspecified back pain laterality, unspecified chronicity stable  3. Systolic congestive heart failure, unspecified congestive heart failure chronicity ([place]) compensated  4. Edema, unspecified type stable

## 2016-04-04 NOTE — Progress Notes (Signed)
Remote pacemaker transmission.   

## 2016-04-13 DIAGNOSIS — H353211 Exudative age-related macular degeneration, right eye, with active choroidal neovascularization: Secondary | ICD-10-CM | POA: Diagnosis not present

## 2016-04-14 DIAGNOSIS — H353123 Nonexudative age-related macular degeneration, left eye, advanced atrophic without subfoveal involvement: Secondary | ICD-10-CM | POA: Diagnosis not present

## 2016-04-14 DIAGNOSIS — H401131 Primary open-angle glaucoma, bilateral, mild stage: Secondary | ICD-10-CM | POA: Diagnosis not present

## 2016-04-21 ENCOUNTER — Other Ambulatory Visit: Payer: Self-pay | Admitting: *Deleted

## 2016-04-21 MED ORDER — METHADONE HCL 5 MG PO TABS: ORAL_TABLET | ORAL | 0 refills | Status: DC

## 2016-04-21 NOTE — Telephone Encounter (Signed)
[facility]

## 2016-04-25 LAB — CUP PACEART REMOTE DEVICE CHECK
Battery Voltage: 2.79 V
Brady Statistic AP VS Percent: 0 %
Brady Statistic AS VS Percent: 0 %
Date Time Interrogation Session: 20171201152908
Implantable Lead Location: 753860
Lead Channel Impedance Value: 649 Ohm
Lead Channel Pacing Threshold Pulse Width: 0.4 ms
Lead Channel Pacing Threshold Pulse Width: 0.4 ms
Lead Channel Setting Pacing Pulse Width: 0.4 ms
MDC IDC LEAD IMPLANT DT: 20050622
MDC IDC LEAD IMPLANT DT: 20050622
MDC IDC LEAD LOCATION: 753859
MDC IDC MSMT BATTERY IMPEDANCE: 356 Ohm
MDC IDC MSMT BATTERY REMAINING LONGEVITY: 82 mo
MDC IDC MSMT LEADCHNL RA IMPEDANCE VALUE: 429 Ohm
MDC IDC MSMT LEADCHNL RA PACING THRESHOLD AMPLITUDE: 0.5 V
MDC IDC MSMT LEADCHNL RV PACING THRESHOLD AMPLITUDE: 0.5 V
MDC IDC PG IMPLANT DT: 20140708
MDC IDC SET LEADCHNL RA PACING AMPLITUDE: 2 V
MDC IDC SET LEADCHNL RV PACING AMPLITUDE: 2.5 V
MDC IDC SET LEADCHNL RV SENSING SENSITIVITY: 4 mV
MDC IDC STAT BRADY AP VP PERCENT: 99 %
MDC IDC STAT BRADY AS VP PERCENT: 1 %

## 2016-05-04 LAB — HEPATIC FUNCTION PANEL: ALT: 10 U/L (ref 10–40)

## 2016-05-05 ENCOUNTER — Other Ambulatory Visit: Payer: Self-pay | Admitting: *Deleted

## 2016-05-05 MED ORDER — METHADONE HCL 5 MG PO TABS: ORAL_TABLET | ORAL | 0 refills | Status: DC

## 2016-05-05 NOTE — Telephone Encounter (Signed)
[facility] 

## 2016-05-09 ENCOUNTER — Encounter: Payer: Self-pay | Admitting: Nurse Practitioner

## 2016-05-09 ENCOUNTER — Non-Acute Institutional Stay: Payer: Medicare Other | Admitting: Nurse Practitioner

## 2016-05-09 DIAGNOSIS — F5101 Primary insomnia: Secondary | ICD-10-CM | POA: Diagnosis not present

## 2016-05-09 DIAGNOSIS — I5020 Unspecified systolic (congestive) heart failure: Secondary | ICD-10-CM

## 2016-05-09 DIAGNOSIS — I10 Essential (primary) hypertension: Secondary | ICD-10-CM | POA: Diagnosis not present

## 2016-05-09 DIAGNOSIS — K219 Gastro-esophageal reflux disease without esophagitis: Secondary | ICD-10-CM

## 2016-05-09 DIAGNOSIS — R609 Edema, unspecified: Secondary | ICD-10-CM | POA: Diagnosis not present

## 2016-05-09 DIAGNOSIS — I471 Supraventricular tachycardia: Secondary | ICD-10-CM

## 2016-05-09 DIAGNOSIS — I495 Sick sinus syndrome: Secondary | ICD-10-CM | POA: Diagnosis not present

## 2016-05-09 DIAGNOSIS — N400 Enlarged prostate without lower urinary tract symptoms: Secondary | ICD-10-CM

## 2016-05-09 DIAGNOSIS — F320 Major depressive disorder, single episode, mild: Secondary | ICD-10-CM

## 2016-05-09 DIAGNOSIS — M150 Primary generalized (osteo)arthritis: Secondary | ICD-10-CM | POA: Diagnosis not present

## 2016-05-09 DIAGNOSIS — K5900 Constipation, unspecified: Secondary | ICD-10-CM | POA: Diagnosis not present

## 2016-05-09 DIAGNOSIS — M159 Polyosteoarthritis, unspecified: Secondary | ICD-10-CM

## 2016-05-09 NOTE — Assessment & Plan Note (Signed)
Controlled, takes Lisinopril 20mg, Metoprolol 25mg bid, Furosemide 20mg daily.   

## 2016-05-09 NOTE — Assessment & Plan Note (Signed)
Heart rate is in control, continue Metoprolol 25mg bid.  

## 2016-05-09 NOTE — Assessment & Plan Note (Addendum)
Still pain in lower back, c/o sleepiness since increased Methadone to 5mg  am, 7.5mg  pm, will resume 5mg  bid, continue Alevel 220mg  bid, Tylenol and Norco prn.

## 2016-05-09 NOTE — Assessment & Plan Note (Signed)
Compensated clinically, continue Furosemide 20mg  daily, update CBC CMP TSH

## 2016-05-09 NOTE — Assessment & Plan Note (Signed)
Continue Tamsulosin 0.4mg and Finasteride 5mg daily. Stable.  

## 2016-05-09 NOTE — Progress Notes (Signed)
Location:  [facility] Room Number: Al 4 Place of Service:  SNF (31) Provider:  Grete Bosko, Manxie  NP  Murray Hodgkins, MD  Patient Care Team: Kimber Relic, MD as PCP - General (Internal Medicine) [facility] Kaimani Clayson Johnney Ou, NP as Nurse Practitioner (Nurse Practitioner) Lyn Records, MD as Consulting Physician (Cardiology) Barron Alvine, MD as Consulting Physician (Urology) Francisca December, MD (Inactive) as Attending Physician (Cardiology) Leta Speller, MD as Consulting Physician (Dermatology) Elise Benne, MD as Consulting Physician (Ophthalmology)  Extended Emergency Contact Information Primary Emergency Contact: Mortimore,Janet Address: [address]          [place], [place] [address] D[place] of [place] Home Phone: [phone] Mobile Phone: [phone] Relation: Daughter  Code Status:  DNR Goals of care: Advanced Directive information Advanced Directives 05/09/2016  Does Patient Have a Medical Advance Directive? Yes  Type of Estate agent of [place];Out of facility DNR (pink MOST or yellow form)  Does patient want to make changes to medical advance directive? No - Patient declined  Copy of Healthcare Power of Attorney in Chart? Yes  Pre-existing out of facility DNR order (yellow form or pink MOST form) -     Chief Complaint  Patient presents with  . Medical Management of Chronic Issues    HPI:  Pt is a 83 y.o. male seen today for medical management of chronic diseases.      Hx of HTN, controlled on Lisinopril 2mg , Metoprolol 25mg  bid, and Furosemide 20mg , trace edema in ankles has no change, sleeps with aid of Ambien, his mood is stable.Marland Kitchen Hx of GERD controlled symptoms with Omeprazole. Chronic back pain is better managed with Methadone 5mg  am 7.5mg  pm, Norco prn, Naproxen 220mg  bid. His mood is managed on Sertraline 50mg  daily.   Past Medical History:  Diagnosis Date  . Abnormality of gait 06/11/2012  . Alcohol  abuse 04/26/2012  . Allergy, unspecified not elsewhere classified 04/28/2012  . Atrial fibrillation (HCC) 04/28/2012  . BPH (benign prostatic hyperplasia)   . CAD (coronary artery disease)    s/p CABG 2005  . Cardiac pacemaker in situ 04/28/2012  . CHF (congestive heart failure) (HCC) 04/23/2012  . Chronic airway obstruction, not elsewhere classified 04/26/2012  . Congestive heart failure, unspecified 04/28/2012  . Coronary atherosclerosis of native coronary artery 04/28/2012  . Degenerative joint disease 04/21/2012  . Depression    mild per pt  . Edema 04/28/2012  . Essential hypertension 10/17/2012  . Fall 04/21/2012   Prolonged downtime 04/20/2012   . GERD (gastroesophageal reflux disease) 09/13/2012  . Hip pain, right 09/02/2013  . History of rhabdomyolysis   . HTN (hypertension)   . Hyperlipidemia   . Hypertrophy of prostate without urinary obstruction and other lower urinary tract symptoms (LUTS) 04/28/2012  . Ingrowing nail 06/11/2012  . Insomnia 11/15/2012  . Insomnia, unspecified 04/28/2012  . Lumbago   . Macular degeneration   . Major depressive disorder, single episode, unspecified 04/28/2012  . Muscle weakness (generalized) 04/26/2012  . NSTEMI (non-ST elevated myocardial infarction) (HCC) 04/23/2012   Related to the stress of rhabdomyolysis and prolonged downtime prior to admission in a patient with known coronary artery disease   . Osteoarthritis   . Other specified disease of white blood cells 04/26/2012  . Pacemaker 04/21/2012   Near EOL.   . Paroxysmal atrial tachycardia (HCC) 04/21/2012   Brief episodes. Not felt to be an anticoagulation candidate because of age and frailty   . Pericarditis  04/21/2012  . Personal history of fall 04/28/2012  . Reflux esophagitis 04/28/2012  . Rhabdomyolysis   . Seborrheic eczema 12/03/2012  . Sick sinus syndrome (HCC) 10/14/2003; 10/29/2012   MDT EnPulse implanted by Dr Amil Amen for SSS; generator change 10/29/2012 by Dr Johney Frame MDT Jana Half pacemaker  .  Sporotrichosis    remote  . Synovial cyst of wrist 11/10/2014   Left wrist    . Tachy-brady syndrome (HCC) 04/21/2012   Pacemaker is near EOL   . Unspecified constipation 09/13/2012  . Unspecified glaucoma(365.9) 04/28/2012   Past Surgical History:  Procedure Laterality Date  . ANGIOPLASTY  9/11/190   Dr. Katrinka Blazing  . APPENDECTOMY    . BACK SURGERY  03/11/2001  . bilateral knee arthroplasty  09/28/83; 05/17/1992   right then left  . CARPAL TUNNEL RELEASE Left   . CARPAL TUNNEL RELEASE Right 2007  . CATARACT EXTRACTION Right 10/30/1996  . CORONARY ARTERY BYPASS GRAFT  2005  . CYSTOSCOPY  08/14/85  . ELBOW SURGERY     repair  . EPIDIDYMIS SURGERY  09/17/1985  . HERNIA REPAIR Right 05/19/1976  . PACEMAKER GENERATOR CHANGE  10/29/12   Dr. Johney Frame  . PACEMAKER GENERATOR CHANGE N/A 10/29/2012   Procedure: PACEMAKER GENERATOR CHANGE;  Surgeon: Hillis Range, MD;  Location[facility] C[facility] LAB;  Service: Cardiovascular;  Laterality: N/A;  . PACEMAKER INSERTION  10/14/2003;10/29/2012   MDT Implanted by Dr Amil Amen for SSS; generator change 10/29/2012 by Dr Johney Frame [employer] Rheems  . REPLACEMENT TOTAL KNEE BILATERAL  1992 and 1996  . ROTATOR CUFF REPAIR Right 2008  . SHOULDER OPEN ROTATOR CUFF REPAIR Left 02/07/1996  . SIGMOIDOSCOPY  08/26/2001  . TOE SURGERY Right 2005   correction  . TRANSURETHRAL RESECTION OF PROSTATE  07/27/1983  . YAG LASER APPLICATION Right 12/26/1973   eye  . YAG LASER APPLICATION Right 04/30/2001  . YAG LASER APPLICATION Left 05/07/2001    Allergies  Allergen Reactions  . Aleve [Naproxen Sodium]   . Celebrex [Celecoxib]     Lips swelled  . Sulfur     Lips swelled  . Vioxx [Rofecoxib] Other (See Comments)    unknown    Allergies as of 05/09/2016      Reactions   Aleve [naproxen Sodium]    Celebrex [celecoxib]    Lips swelled   Sulfur    Lips swelled   Vioxx [rofecoxib] Other (See Comments)   unknown      Medication List       Accurate as of 05/09/16  3:12 PM. Always use your most  recent med list.          acetaminophen 325 MG tablet Commonly known as:  TYLENOL Take 650 mg by mouth every 4 (four) hours as needed for mild pain.   amoxicillin 500 MG tablet Commonly known as:  AMOXIL Take 500 mg by mouth. Take one tablet one hour prior to surgery   aspirin 81 MG tablet Take 81 mg by mouth daily.   bimatoprost 0.01 % Soln Commonly known as:  LUMIGAN Place 1 drop into both eyes at bedtime.   carbamide peroxide 6.5 % otic solution Commonly known as:  DEBROX Reported on 07/13/2015   DERMAZENE 1 % Crea Generic drug:  Iodoquinol-HC Apply topically daily as needed. For rash in groin area.   diclofenac sodium 1 % Gel Commonly known as:  VOLTAREN Apply four times a day as needed for pain   finasteride 5 MG tablet Commonly known as:  PROSCAR Take one tablet by  mouth once daily   fluticasone 50 MCG/ACT nasal spray Commonly known as:  FLONASE Place 1 spray into both nostrils daily.   furosemide 20 MG tablet Commonly known as:  LASIX Take one tablet by mouth once daily. One twice weekly as needed for increased weight 2-3 lbs   GOLD BOND ULTIMATE HEALING Lotn Apply 5 mLs topically 2 (two) times daily.   guaiFENesin 600 MG 12 hr tablet Commonly known as:  MUCINEX Take 600 mg by mouth 2 (two) times daily.   ICAPS PO Take by mouth. Take one daily   KLOR-CON M10 10 MEQ tablet Generic drug:  potassium chloride Take 1 tablet by mouth twice weekly as needed with Lasix   lidocaine 4 % cream Commonly known as:  ASPERCREME W/LIDOCAINE Apply morning and evening to painful area of the lower back   lisinopril 20 MG tablet Commonly known as:  PRINIVIL,ZESTRIL Take one tablet daily for blood pressure   loratadine 10 MG tablet Commonly known as:  CLARITIN Take 10 mg by mouth daily. Reported on 07/13/2015   methadone 5 MG tablet Commonly known as:  DOLOPHINE Take one tablet by mouth every morning and take One and 1/2 tablet by mouth every evening for pain    metoprolol tartrate 25 MG tablet Commonly known as:  LOPRESSOR Take 25 mg by mouth 2 (two) times daily.   naproxen sodium 220 MG tablet Commonly known as:  ANAPROX Take 220 mg by mouth 2 (two) times daily.   nitroGLYCERIN 0.4 MG SL tablet Commonly known as:  NITROSTAT Place 0.4 mg under the tongue every 5 (five) minutes as needed. Reported on 07/13/2015   omeprazole 20 MG capsule Commonly known as:  PRILOSEC Take 20 mg by mouth daily.   Polyethyl Glycol-Propyl Glycol 0.4-0.3 % Soln Apply to eye. One drop both eyes twice daily   REFRESH PLUS 0.5 % Soln Generic drug:  carboxymethylcellulose 1 drop. Both eyes four times daily as needed for dry eyes   sertraline 50 MG tablet Commonly known as:  ZOLOFT One daily to help depression   tamsulosin 0.4 MG Caps capsule Commonly known as:  FLOMAX Take 0.4 mg by mouth daily.   zolpidem 5 MG tablet Commonly known as:  AMBIEN Take one tablet by mouth at bedtime for rest       Review of Systems  Constitutional: Negative.  Negative for activity change, appetite change, fatigue and unexpected weight change.  HENT: Negative.  Negative for hearing loss, sinus pressure and sore throat.        02/19/16 nose bleed, left nostril, stopped by pressure dressing  Eyes:       Hx macular degeneration. Receiving injections every 6-8 weeks.  Respiratory: Positive for cough. Negative for apnea, chest tightness, shortness of breath, wheezing and stridor.   Cardiovascular: Positive for leg swelling. Negative for chest pain and palpitations.  Gastrointestinal: Negative for abdominal distention, abdominal pain, nausea and vomiting.       Flatulence  Endocrine: Negative.   Genitourinary: Negative for decreased urine volume, frequency and urgency.       BPH.  Musculoskeletal: Positive for back pain and gait problem (uunstable. uses walker). Negative for neck pain and neck stiffness.       X-ray reports from 2009 and CT of the lumbar spine shw this  patient has foraminal stenoses and  spinal stenosis now. There is multiple intervertebral degenerative disc disease. He has had [facility], Dr. Naaman PlummerFred Newton. Some pain radiates down the  right leg. He is now comfortable moving around using his 4 wheel walker. He is able to get in bed and lay down comfortably overnight.  Skin:       Dry. Itching on dorsum of hands.  Allergic/Immunologic: Negative.   Hematological: Negative.   Psychiatric/Behavioral: Positive for sleep disturbance (insomnia. Ambien helps.).    Immunization History  Administered Date(s) Administered  . Influenza Whole 02/02/2013  . Influenza-Unspecified 01/29/2014, 01/28/2015, 02/03/2016  . PPD Test 05/09/2012  . Pneumococcal Polysaccharide-23 04/24/2009  . Tdap 04/21/2012  . Zoster 04/25/2007   Pertinent  Health Maintenance Due  Topic Date Due  . PNA vac Low Risk Adult (2 of 2 - PCV13) 04/24/2010  . INFLUENZA VACCINE  Completed   Fall Risk  12/14/2015 07/13/2015 12/15/2014 08/04/2014  Falls in the past year? Yes No No No  Number falls in past yr: 1 - - -  Injury with Fall? No - - -  Risk for fall due to : - - - History of fall(s);Impaired balance/gait   Functional Status Survey:    Vitals:   05/09/16 1113  BP: (!) 120/54  Pulse: 60  Resp: 16  Temp: 98.2 F (36.8 C)  SpO2: 95%  Weight: 182 lb (82.6 kg)  Height: 5\' 8"  (1.727 m)   Body mass index is 27.67 kg/m. Physical Exam  Constitutional: He is oriented to person, place, and time. He appears well-nourished. No distress.  Frail  HENT:  Head: Normocephalic and atraumatic.  Right Ear: External ear normal.  Left Ear: External ear normal.  Nose: Nose normal.  Mouth/Throat: Oropharynx is clear and moist.  Mild decrease in hearing  Eyes: Conjunctivae and EOM are normal. Pupils are equal, round, and reactive to light.  Neck: No JVD present. No tracheal deviation present. No thyromegaly present.  Cardiovascular: Normal  rate, regular rhythm and intact distal pulses.  Exam reveals no gallop and no friction rub.   No murmur heard. Pulmonary/Chest: Effort normal. No respiratory distress. He has no wheezes. He has rales (Right chest).  Abdominal: He exhibits no distension and no mass. There is no tenderness.  Large upper diastasis recti  Musculoskeletal: Normal range of motion. He exhibits edema (1+ bipedal). He exhibits no tenderness.  Using walker. Bilateral knee prosthesis. Tender lumbar spine and shading towards the left side of the small of the back. BLE trace edema  Lymphadenopathy:    He has no cervical adenopathy.  Neurological: He is alert and oriented to person, place, and time. No cranial nerve deficit. Coordination normal.  Diminished sensation to vibration in the right foot.  Skin: Skin is dry. No rash noted. No erythema. No pallor.  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 Multiple SK.  Psychiatric: He has a normal mood and affect. His behavior is normal. Judgment and thought content normal.    Labs reviewed:  Recent Labs  11/11/15  NA 136*  K 4.9  BUN 36*  CREATININE 1.4*    Recent Labs  11/11/15  AST 13*  ALT 10  ALKPHOS 57    Recent Labs  11/11/15  WBC 5.9  HGB 11.9*  HCT 35*  PLT 157   Lab Results  Component Value Date   TSH 1.10 10/19/2014   Lab Results  Component Value Date   HGBA1C 6.1 (H) 04/22/2012   Lab Results  Component Value Date   CHOL 149 04/22/2012   HDL 35 (L) 04/22/2012   LDLCALC 96 04/22/2012  TRIG 89 04/22/2012   CHOLHDL 4.3 04/22/2012    Significant Diagnostic Results in last 30 days:  No results found.  Assessment/Plan Tachy-brady syndrome F/u Cardiology for pacemaker.  Paroxysmal atrial tachycardia Heart rate is in control, continue Metoprolol 25mg  bid.    CHF (congestive heart failure) (HCC) Compensated clinically, continue Furosemide 20mg  daily, update CBC CMP TSH   Essential  hypertension Controlled, takes Lisinopril 20mg , Metoprolol 25mg  bid, Furosemide 20mg  daily.    Constipation Stable, change MiraLax 1/2 daily, adding Senna S I qhs   GERD (gastroesophageal reflux disease) Stable, continue Omeprazole 20mg  daily.   Degenerative joint disease Still pain in lower back, c/o sleepiness since increased Methadone to 5mg  am, 7.5mg  pm, will resume 5mg  bid, continue Alevel 220mg  bid, Tylenol and Norco prn.   BPH (benign prostatic hyperplasia) Continue Tamsulosin 0.4mg  and Finasteride 5mg  daily. Stable.    Depression Mood is stable, continue Sertraline 50mg  daily.   Edema trace in BLE, continue Furosemide 20mg  daily.   Insomnia Stable, continue Ambien      Family/ staff Communication: AL  Labs/tests ordered:  CBC CMP TSH

## 2016-05-09 NOTE — Assessment & Plan Note (Addendum)
Mood is stable, continue Sertraline 50mg daily.  

## 2016-05-09 NOTE — Assessment & Plan Note (Addendum)
Stable, change MiraLax 1/2 daily, adding Senna S I qhs

## 2016-05-09 NOTE — Assessment & Plan Note (Signed)
Stable, continue Omeprazole 20mg daily.  

## 2016-05-09 NOTE — Assessment & Plan Note (Signed)
trace in BLE, continue Furosemide 20mg daily. 

## 2016-05-09 NOTE — Assessment & Plan Note (Signed)
F/u Cardiology for pacemaker.

## 2016-05-09 NOTE — Assessment & Plan Note (Signed)
Stable, continue Ambien 

## 2016-05-11 DIAGNOSIS — I10 Essential (primary) hypertension: Secondary | ICD-10-CM | POA: Diagnosis not present

## 2016-05-11 DIAGNOSIS — E039 Hypothyroidism, unspecified: Secondary | ICD-10-CM | POA: Diagnosis not present

## 2016-05-11 DIAGNOSIS — D649 Anemia, unspecified: Secondary | ICD-10-CM | POA: Diagnosis not present

## 2016-05-11 LAB — CBC AND DIFFERENTIAL
Hemoglobin: 12.1 g/dL — AB (ref 13.5–17.5)
Platelets: 147 10*3/uL — AB (ref 150–399)
WBC: 6.2 10*3/mL

## 2016-05-11 LAB — BASIC METABOLIC PANEL
BUN: 42 mg/dL — AB (ref 4–21)
Creatinine: 1.5 mg/dL — AB (ref <=1.3)
Glucose: 109 mg/dL
POTASSIUM: 5.2 mmol/L (ref 3.4–5.3)
SODIUM: 136 mmol/L — AB (ref 137–147)

## 2016-05-11 LAB — HEPATIC FUNCTION PANEL
ALK PHOS: 62 U/L (ref 25–125)
AST: 18 U/L (ref 14–40)
Bilirubin, Total: 0.5 mg/dL

## 2016-05-11 LAB — TSH: TSH: 3.06 u[IU]/mL (ref <=5.90)

## 2016-05-12 ENCOUNTER — Other Ambulatory Visit: Payer: Self-pay | Admitting: *Deleted

## 2016-05-16 ENCOUNTER — Other Ambulatory Visit: Payer: Self-pay | Admitting: *Deleted

## 2016-05-16 MED ORDER — METHADONE HCL 5 MG PO TABS: ORAL_TABLET | ORAL | 0 refills | Status: DC

## 2016-05-16 NOTE — Telephone Encounter (Signed)
[facility] #[phone] Fax: [phone]  

## 2016-05-22 DIAGNOSIS — R7989 Other specified abnormal findings of blood chemistry: Secondary | ICD-10-CM | POA: Diagnosis not present

## 2016-05-22 LAB — BASIC METABOLIC PANEL
BUN: 40 mg/dL — AB (ref 4–21)
Creatinine: 1.5 mg/dL — AB (ref <=1.3)
Glucose: 101 mg/dL
Potassium: 4.6 mmol/L (ref 3.4–5.3)
Sodium: 137 mmol/L (ref 137–147)

## 2016-05-23 ENCOUNTER — Encounter: Payer: Self-pay | Admitting: Nurse Practitioner

## 2016-05-23 ENCOUNTER — Other Ambulatory Visit: Payer: Self-pay | Admitting: *Deleted

## 2016-05-23 DIAGNOSIS — I120 Hypertensive chronic kidney disease with stage 5 chronic kidney disease or end stage renal disease: Secondary | ICD-10-CM | POA: Insufficient documentation

## 2016-05-23 DIAGNOSIS — N185 Chronic kidney disease, stage 5: Secondary | ICD-10-CM

## 2016-06-02 ENCOUNTER — Non-Acute Institutional Stay: Payer: Medicare Other | Admitting: Nurse Practitioner

## 2016-06-02 ENCOUNTER — Encounter: Payer: Self-pay | Admitting: Nurse Practitioner

## 2016-06-02 DIAGNOSIS — N185 Chronic kidney disease, stage 5: Secondary | ICD-10-CM

## 2016-06-02 DIAGNOSIS — N400 Enlarged prostate without lower urinary tract symptoms: Secondary | ICD-10-CM

## 2016-06-02 DIAGNOSIS — B351 Tinea unguium: Secondary | ICD-10-CM | POA: Diagnosis not present

## 2016-06-02 DIAGNOSIS — I10 Essential (primary) hypertension: Secondary | ICD-10-CM | POA: Diagnosis not present

## 2016-06-02 DIAGNOSIS — I120 Hypertensive chronic kidney disease with stage 5 chronic kidney disease or end stage renal disease: Secondary | ICD-10-CM

## 2016-06-02 DIAGNOSIS — M79672 Pain in left foot: Secondary | ICD-10-CM | POA: Diagnosis not present

## 2016-06-02 DIAGNOSIS — K219 Gastro-esophageal reflux disease without esophagitis: Secondary | ICD-10-CM

## 2016-06-02 DIAGNOSIS — I5020 Unspecified systolic (congestive) heart failure: Secondary | ICD-10-CM | POA: Diagnosis not present

## 2016-06-02 DIAGNOSIS — F320 Major depressive disorder, single episode, mild: Secondary | ICD-10-CM

## 2016-06-02 DIAGNOSIS — M2041 Other hammer toe(s) (acquired), right foot: Secondary | ICD-10-CM | POA: Diagnosis not present

## 2016-06-02 DIAGNOSIS — F5101 Primary insomnia: Secondary | ICD-10-CM | POA: Diagnosis not present

## 2016-06-02 DIAGNOSIS — K5900 Constipation, unspecified: Secondary | ICD-10-CM | POA: Diagnosis not present

## 2016-06-02 DIAGNOSIS — M79671 Pain in right foot: Secondary | ICD-10-CM | POA: Diagnosis not present

## 2016-06-02 DIAGNOSIS — R609 Edema, unspecified: Secondary | ICD-10-CM | POA: Diagnosis not present

## 2016-06-02 DIAGNOSIS — I471 Supraventricular tachycardia: Secondary | ICD-10-CM

## 2016-06-02 DIAGNOSIS — M5440 Lumbago with sciatica, unspecified side: Secondary | ICD-10-CM | POA: Diagnosis not present

## 2016-06-02 NOTE — Assessment & Plan Note (Signed)
05/22/16 Na 137, K 4.6, Bun 40, creat 1.52, BNP 206.4

## 2016-06-02 NOTE — Assessment & Plan Note (Addendum)
Compensated clinically, continue Furosemide 20mg  daily, 05/22/16 Na 137, K 4.6, Bun 40, creat 1.52, BNP 206.4

## 2016-06-02 NOTE — Assessment & Plan Note (Signed)
Increase Methadone to 10mg  qhs and continue 5mg  am for better pain control.

## 2016-06-02 NOTE — Progress Notes (Signed)
Location:  [facility] Room Number: 4 Place of Service:  ALF [phone]) Provider:  Mast, Manxie  NP  Murray Hodgkins, MD  Patient Care Team: Kimber Relic, MD as PCP - General (Internal Medicine) [facility] Johnney Ou, NP as Nurse Practitioner (Nurse Practitioner) Lyn Records, MD as Consulting Physician (Cardiology) Barron Alvine, MD as Consulting Physician (Urology) Francisca December, MD (Inactive) as Attending Physician (Cardiology) Leta Speller, MD as Consulting Physician (Dermatology) Elise Benne, MD as Consulting Physician (Ophthalmology)  Extended Emergency Contact Information Primary Emergency Contact: Mortimore,Janet Address: [address]          [place], [place] [address] [place] of [place] Home Phone: [phone] Mobile Phone: [phone] Relation: Daughter  Code Status:  DNR Goals of care: Advanced Directive information Advanced Directives 06/02/2016  Does Patient Have a Medical Advance Directive? Yes  Type of Estate agent of [place];Out of facility DNR (pink MOST or yellow form)  Does patient want to make changes to medical advance directive? No - Patient declined  Copy of Healthcare Power of Attorney in Chart? Yes  Pre-existing out of facility DNR order (yellow form or pink MOST form) Yellow form placed in chart (order not valid for inpatient use)     Chief Complaint  Patient presents with  . Acute Visit    back pain at night x 3    HPI:  Pt is a 83 y.o. male seen today for an acute visit for relapsed low back, worse at night, keep him from rest at night, taking Methadone 5mg  am and 7.5mg  pm, tolerated well.   Hx of HTN, controlled on Lisinopril 2mg , Metoprolol 25mg  bid, and Furosemide 20mg , trace edema in ankles has no change, sleeps with aid of Ambien.  Hx of GERD controlled symptoms with Omeprazole. Chronic back pain is better managed with Methadone 5mg  am 7.5mg  pm, Norco prn, Naproxen 220mg  bid. His  mood is managed on Sertraline 50mg  daily.   Past Medical History:  Diagnosis Date  . Abnormality of gait 06/11/2012  . Alcohol abuse 04/26/2012  . Allergy, unspecified not elsewhere classified 04/28/2012  . Atrial fibrillation (HCC) 04/28/2012  . BPH (benign prostatic hyperplasia)   . CAD (coronary artery disease)    s/p CABG 2005  . Cardiac pacemaker in situ 04/28/2012  . CHF (congestive heart failure) (HCC) 04/23/2012  . Chronic airway obstruction, not elsewhere classified 04/26/2012  . Congestive heart failure, unspecified 04/28/2012  . Coronary atherosclerosis of native coronary artery 04/28/2012  . Degenerative joint disease 04/21/2012  . Depression    mild per pt  . Edema 04/28/2012  . Essential hypertension 10/17/2012  . Fall 04/21/2012   Prolonged downtime 04/20/2012   . GERD (gastroesophageal reflux disease) 09/13/2012  . Hip pain, right 09/02/2013  . History of rhabdomyolysis   . HTN (hypertension)   . Hyperlipidemia   . Hypertrophy of prostate without urinary obstruction and other lower urinary tract symptoms (LUTS) 04/28/2012  . Ingrowing nail 06/11/2012  . Insomnia 11/15/2012  . Insomnia, unspecified 04/28/2012  . Lumbago   . Macular degeneration   . Major depressive disorder, single episode, unspecified 04/28/2012  . Muscle weakness (generalized) 04/26/2012  . NSTEMI (non-ST elevated myocardial infarction) (HCC) 04/23/2012   Related to the stress of rhabdomyolysis and prolonged downtime prior to admission in a patient with known coronary artery disease   . Osteoarthritis   . Other specified disease of white blood cells 04/26/2012  . Pacemaker 04/21/2012   Near  EOL.   . Paroxysmal atrial tachycardia (HCC) 04/21/2012   Brief episodes. Not felt to be an anticoagulation candidate because of age and frailty   . Pericarditis 04/21/2012  . Personal history of fall 04/28/2012  . Reflux esophagitis 04/28/2012  . Rhabdomyolysis   . Seborrheic eczema 12/03/2012  . Sick sinus syndrome (HCC) 10/14/2003;  10/29/2012   MDT EnPulse implanted by Dr Amil Amen for SSS; generator change 10/29/2012 by Dr Johney Frame MDT Jana Half pacemaker  . Sporotrichosis    remote  . Synovial cyst of wrist 11/10/2014   Left wrist    . Tachy-brady syndrome (HCC) 04/21/2012   Pacemaker is near EOL   . Unspecified constipation 09/13/2012  . Unspecified glaucoma(365.9) 04/28/2012   Past Surgical History:  Procedure Laterality Date  . ANGIOPLASTY  9/11/190   Dr. Katrinka Blazing  . APPENDECTOMY    . BACK SURGERY  03/11/2001  . bilateral knee arthroplasty  09/28/83; 05/17/1992   right then left  . CARPAL TUNNEL RELEASE Left   . CARPAL TUNNEL RELEASE Right 2007  . CATARACT EXTRACTION Right 10/30/1996  . CORONARY ARTERY BYPASS GRAFT  2005  . CYSTOSCOPY  08/14/85  . ELBOW SURGERY     repair  . EPIDIDYMIS SURGERY  09/17/1985  . HERNIA REPAIR Right 05/19/1976  . PACEMAKER GENERATOR CHANGE  10/29/12   Dr. Johney Frame  . PACEMAKER GENERATOR CHANGE N/A 10/29/2012   Procedure: PACEMAKER GENERATOR CHANGE;  Surgeon: Hillis Range, MD;  Location: [facility] CATH LAB;  Service: Cardiovascular;  Laterality: N/A;  . PACEMAKER INSERTION  10/14/2003;10/29/2012   MDT Implanted by Dr Amil Amen for SSS; generator change 10/29/2012 by Dr Johney Frame [employer] [place]  . REPLACEMENT TOTAL KNEE BILATERAL  1992 and 1996  . ROTATOR CUFF REPAIR Right 2008  . SHOULDER OPEN ROTATOR CUFF REPAIR Left 02/07/1996  . SIGMOIDOSCOPY  08/26/2001  . TOE SURGERY Right 2005   correction  . TRANSURETHRAL RESECTION OF PROSTATE  07/27/1983  . YAG LASER APPLICATION Right 12/26/1973   eye  . YAG LASER APPLICATION Right 04/30/2001  . YAG LASER APPLICATION Left 05/07/2001    Allergies  Allergen Reactions  . Aleve [Naproxen Sodium]   . Celebrex [Celecoxib]     Lips swelled  . Sulfur     Lips swelled  . Vioxx [Rofecoxib] Other (See Comments)    unknown    Allergies as of 06/02/2016      Reactions   Aleve [naproxen Sodium]    Celebrex [celecoxib]    Lips swelled   Sulfur    Lips swelled   Vioxx  [rofecoxib] Other (See Comments)   unknown      Medication List       Accurate as of 06/02/16  2:55 PM. Always use your most recent med list.          acetaminophen 325 MG tablet Commonly known as:  TYLENOL Take 650 mg by mouth every 4 (four) hours as needed for mild pain.   amoxicillin 500 MG tablet Commonly known as:  AMOXIL Take 500 mg by mouth. Take one tablet one hour prior to surgery   aspirin 81 MG tablet Take 81 mg by mouth daily.   bimatoprost 0.01 % Soln Commonly known as:  LUMIGAN Place 1 drop into both eyes at bedtime.   carbamide peroxide 6.5 % otic solution Commonly known as:  DEBROX Reported on 07/13/2015   DERMAZENE 1 % Crea Generic drug:  Iodoquinol-HC Apply topically daily as needed. For rash in groin area.   diclofenac sodium 1 % Gel  Commonly known as:  VOLTAREN Apply four times a day as needed for pain   docusate sodium 100 MG capsule Commonly known as:  COLACE Take 100 mg by mouth 2 (two) times daily as needed for mild constipation.   finasteride 5 MG tablet Commonly known as:  PROSCAR Take one tablet by mouth once daily   fluticasone 50 MCG/ACT nasal spray Commonly known as:  FLONASE Place 1 spray into both nostrils daily.   furosemide 20 MG tablet Commonly known as:  LASIX Take one tablet by mouth once daily. One twice weekly as needed for increased weight 2-3 lbs   guaiFENesin 600 MG 12 hr tablet Commonly known as:  MUCINEX Take 600 mg by mouth 2 (two) times daily.   ICAPS PO Take by mouth. Take one daily   KLOR-CON M10 10 MEQ tablet Generic drug:  potassium chloride Take 1 tablet by mouth twice weekly as needed with Lasix   lidocaine 4 % cream Commonly known as:  ASPERCREME W/LIDOCAINE Apply morning and evening to painful area of the lower back   lisinopril 20 MG tablet Commonly known as:  PRINIVIL,ZESTRIL Take one tablet daily for blood pressure   loratadine 10 MG tablet Commonly known as:  CLARITIN Take 10 mg by mouth  daily. Reported on 07/13/2015   metoprolol tartrate 25 MG tablet Commonly known as:  LOPRESSOR Take 25 mg by mouth 2 (two) times daily.   naproxen sodium 220 MG tablet Commonly known as:  ANAPROX Take 220 mg by mouth 2 (two) times daily.   nitroGLYCERIN 0.4 MG SL tablet Commonly known as:  NITROSTAT Place 0.4 mg under the tongue every 5 (five) minutes as needed. Reported on 07/13/2015   omeprazole 20 MG capsule Commonly known as:  PRILOSEC Take 20 mg by mouth daily.   Polyethyl Glycol-Propyl Glycol 0.4-0.3 % Soln Apply to eye. One drop both eyes twice daily   polyethylene glycol packet Commonly known as:  MIRALAX / GLYCOLAX Take 17 g by mouth daily.   REFRESH PLUS 0.5 % Soln Generic drug:  carboxymethylcellulose 1 drop. Both eyes four times daily as needed for dry eyes   sertraline 50 MG tablet Commonly known as:  ZOLOFT One daily to help depression   tamsulosin 0.4 MG Caps capsule Commonly known as:  FLOMAX Take 0.4 mg by mouth daily.   zolpidem 5 MG tablet Commonly known as:  AMBIEN Take one tablet by mouth at bedtime for rest       Review of Systems  Constitutional: Negative.  Negative for activity change, appetite change, fatigue and unexpected weight change.  HENT: Negative.  Negative for hearing loss, sinus pressure and sore throat.        02/19/16 nose bleed, left nostril, stopped by pressure dressing  Eyes:       Hx macular degeneration. Receiving injections every 6-8 weeks.  Respiratory: Positive for cough. Negative for apnea, chest tightness, shortness of breath, wheezing and stridor.   Cardiovascular: Positive for leg swelling. Negative for chest pain and palpitations.  Gastrointestinal: Negative for abdominal distention, abdominal pain, nausea and vomiting.       Flatulence  Endocrine: Negative.   Genitourinary: Negative for decreased urine volume, frequency and urgency.       BPH.  Musculoskeletal: Positive for back pain and gait problem (uunstable.  uses walker). Negative for neck pain and neck stiffness.       X-ray reports from 2009 and CT of the lumbar spine shw this patient has foraminal stenoses and  spinal stenosis now. There is multiple intervertebral degenerative disc disease. He has had steroid injections at [facility], Dr. Naaman Plummer. Some pain radiates down the right leg. He is now comfortable moving around using his 4 wheel walker. He is able to get in bed and lay down comfortably overnight.  Skin:       Dry. Itching on dorsum of hands.  Allergic/Immunologic: Negative.   Hematological: Negative.   Psychiatric/Behavioral: Positive for sleep disturbance (insomnia. Ambien helps.).    Immunization History  Administered Date(s) Administered  . Influenza Whole 02/02/2013  . Influenza-Unspecified 01/29/2014, 01/28/2015, 02/03/2016  . PPD Test 05/09/2012  . Pneumococcal Polysaccharide-23 04/24/2009  . Tdap 04/21/2012  . Zoster 04/25/2007   Pertinent  Health Maintenance Due  Topic Date Due  . PNA vac Low Risk Adult (2 of 2 - PCV13) 04/24/2010  . INFLUENZA VACCINE  Completed   Fall Risk  12/14/2015 07/13/2015 12/15/2014 08/04/2014  Falls in the past year? Yes No No No  Number falls in past yr: 1 - - -  Injury with Fall? No - - -  Risk for fall due to : - - - History of fall(s);Impaired balance/gait   Functional Status Survey:    Vitals:   06/02/16 1225  BP: (!) 131/58  Pulse: 64  Resp: 18  Temp: 97.9 F (36.6 C)  SpO2: 97%  Weight: 181 lb 3.2 oz (82.2 kg)  Height: 5\' 8"  (1.727 m)   Body mass index is 27.55 kg/m. Physical Exam  Constitutional: He is oriented to person, place, and time. He appears well-nourished. No distress.  Frail  HENT:  Head: Normocephalic and atraumatic.  Right Ear: External ear normal.  Left Ear: External ear normal.  Nose: Nose normal.  Mouth/Throat: Oropharynx is clear and moist.  Mild decrease in hearing  Eyes: Conjunctivae and EOM are normal. Pupils are equal,  round, and reactive to light.  Neck: No JVD present. No tracheal deviation present. No thyromegaly present.  Cardiovascular: Normal rate, regular rhythm and intact distal pulses.  Exam reveals no gallop and no friction rub.   No murmur heard. Pulmonary/Chest: Effort normal. No respiratory distress. He has no wheezes. He has rales (Right chest).  Abdominal: He exhibits no distension and no mass. There is no tenderness.  Large upper diastasis recti  Musculoskeletal: Normal range of motion. He exhibits edema (1+ bipedal) and tenderness.  Using walker. Bilateral knee prosthesis. Tender lumbar spine and shading towards the left side of the small of the back. BLE trace edema  Lymphadenopathy:    He has no cervical adenopathy.  Neurological: He is alert and oriented to person, place, and time. No cranial nerve deficit. Coordination normal.  Diminished sensation to vibration in the right foot.  Skin: Skin is dry. No rash noted. No erythema. No pallor.  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 Multiple SK.  Psychiatric: He has a normal mood and affect. His behavior is normal. Judgment and thought content normal.    Labs reviewed:  Recent Labs  11/11/15 05/11/16 05/22/16  NA 136* 136* 137  K 4.9 5.2 4.6  BUN 36* 42* 40*  CREATININE 1.4* 1.5* 1.5*    Recent Labs  11/11/15 05/04/16 05/11/16  AST 13*  --  18  ALT 10 10  --   ALKPHOS 57  --  62    Recent Labs  11/11/15 05/11/16  WBC 5.9 6.2  HGB 11.9* 12.1*  HCT 35*  --  PLT 157 147*   Lab Results  Component Value Date   TSH 3.06 05/11/2016   Lab Results  Component Value Date   HGBA1C 6.1 (H) 04/22/2012   Lab Results  Component Value Date   CHOL 149 04/22/2012   HDL 35 (L) 04/22/2012   LDLCALC 96 04/22/2012   TRIG 89 04/22/2012   CHOLHDL 4.3 04/22/2012    Significant Diagnostic Results in last 30 days:  No results found.  Assessment/Plan Lumbago Increase Methadone to  10mg  qhs and continue 5mg  am for better pain control.   Paroxysmal atrial tachycardia (HCC) Heart rate is in control, continue Metoprolol 25mg  bid.     CHF (congestive heart failure) (HCC) Compensated clinically, continue Furosemide 20mg  daily, 05/22/16 Na 137, K 4.6, Bun 40, creat 1.52, BNP 206.4   Essential hypertension Controlled, takes Lisinopril 20mg , Metoprolol 25mg  bid, Furosemide 20mg  daily.   Constipation Stable, continue MiraLax and Colace    GERD (gastroesophageal reflux disease) Stable, continue Omeprazole 20mg  daily.  BPH (benign prostatic hyperplasia) Continue Tamsulosin 0.4mg  and Finasteride 5mg  daily. Stable.   Hypertensive kidney disease with CKD (chronic kidney disease) stage V (HCC) 05/22/16 Na 137, K 4.6, Bun 40, creat 1.52, BNP 206.4  Depression Mood is stable, continue Sertraline 50mg  daily.   Edema trace in BLE, continue Furosemide 20mg  daily.  Insomnia Stable, continue Ambien     Family/ staff Communication: AL  Labs/tests ordered: none

## 2016-06-02 NOTE — Assessment & Plan Note (Signed)
Controlled, takes Lisinopril 20mg, Metoprolol 25mg bid, Furosemide 20mg daily.   

## 2016-06-02 NOTE — Assessment & Plan Note (Signed)
Stable, continue Ambien 

## 2016-06-02 NOTE — Assessment & Plan Note (Signed)
Continue Tamsulosin 0.4mg and Finasteride 5mg daily. Stable.  

## 2016-06-02 NOTE — Assessment & Plan Note (Signed)
trace in BLE, continue Furosemide 20mg daily. 

## 2016-06-02 NOTE — Assessment & Plan Note (Signed)
Heart rate is in control, continue Metoprolol 25mg bid.  

## 2016-06-02 NOTE — Assessment & Plan Note (Signed)
Stable, continue MiraLax and Colace

## 2016-06-02 NOTE — Assessment & Plan Note (Signed)
Stable, continue Omeprazole 20mg daily.  

## 2016-06-02 NOTE — Assessment & Plan Note (Signed)
Mood is stable, continue Sertraline 50mg daily.  

## 2016-06-20 DIAGNOSIS — M79632 Pain in left forearm: Secondary | ICD-10-CM | POA: Diagnosis not present

## 2016-06-20 DIAGNOSIS — M6281 Muscle weakness (generalized): Secondary | ICD-10-CM | POA: Diagnosis not present

## 2016-06-20 DIAGNOSIS — M25552 Pain in left hip: Secondary | ICD-10-CM | POA: Diagnosis not present

## 2016-06-20 DIAGNOSIS — M545 Low back pain: Secondary | ICD-10-CM | POA: Diagnosis not present

## 2016-06-22 DIAGNOSIS — M6281 Muscle weakness (generalized): Secondary | ICD-10-CM | POA: Diagnosis not present

## 2016-06-22 DIAGNOSIS — I10 Essential (primary) hypertension: Secondary | ICD-10-CM | POA: Diagnosis not present

## 2016-06-22 DIAGNOSIS — M545 Low back pain: Secondary | ICD-10-CM | POA: Diagnosis not present

## 2016-06-22 DIAGNOSIS — M25552 Pain in left hip: Secondary | ICD-10-CM | POA: Diagnosis not present

## 2016-06-22 LAB — BASIC METABOLIC PANEL
BUN: 37 mg/dL — AB (ref 4–21)
CREATININE: 1.3 mg/dL (ref <=1.3)
Glucose: 97 mg/dL
Potassium: 4.7 mmol/L (ref 3.4–5.3)
SODIUM: 137 mmol/L (ref 137–147)

## 2016-06-26 DIAGNOSIS — M25552 Pain in left hip: Secondary | ICD-10-CM | POA: Diagnosis not present

## 2016-06-26 DIAGNOSIS — M6281 Muscle weakness (generalized): Secondary | ICD-10-CM | POA: Diagnosis not present

## 2016-06-26 DIAGNOSIS — M545 Low back pain: Secondary | ICD-10-CM | POA: Diagnosis not present

## 2016-06-27 ENCOUNTER — Other Ambulatory Visit: Payer: Self-pay | Admitting: *Deleted

## 2016-06-28 DIAGNOSIS — M25552 Pain in left hip: Secondary | ICD-10-CM | POA: Diagnosis not present

## 2016-06-28 DIAGNOSIS — M6281 Muscle weakness (generalized): Secondary | ICD-10-CM | POA: Diagnosis not present

## 2016-06-28 DIAGNOSIS — M545 Low back pain: Secondary | ICD-10-CM | POA: Diagnosis not present

## 2016-07-03 ENCOUNTER — Non-Acute Institutional Stay: Payer: Medicare Other | Admitting: Internal Medicine

## 2016-07-03 ENCOUNTER — Encounter: Payer: Self-pay | Admitting: Internal Medicine

## 2016-07-03 DIAGNOSIS — M5440 Lumbago with sciatica, unspecified side: Secondary | ICD-10-CM | POA: Diagnosis not present

## 2016-07-03 DIAGNOSIS — I10 Essential (primary) hypertension: Secondary | ICD-10-CM | POA: Diagnosis not present

## 2016-07-03 DIAGNOSIS — I120 Hypertensive chronic kidney disease with stage 5 chronic kidney disease or end stage renal disease: Secondary | ICD-10-CM | POA: Diagnosis not present

## 2016-07-03 DIAGNOSIS — M48061 Spinal stenosis, lumbar region without neurogenic claudication: Secondary | ICD-10-CM

## 2016-07-03 DIAGNOSIS — R609 Edema, unspecified: Secondary | ICD-10-CM | POA: Diagnosis not present

## 2016-07-03 DIAGNOSIS — N185 Chronic kidney disease, stage 5: Secondary | ICD-10-CM

## 2016-07-03 DIAGNOSIS — I5020 Unspecified systolic (congestive) heart failure: Secondary | ICD-10-CM | POA: Diagnosis not present

## 2016-07-03 NOTE — Addendum Note (Signed)
Addended by: Kimber RelicGREEN, Carmino G on: 07/03/2016 03:54 PM   Modules accepted: Level of Service

## 2016-07-03 NOTE — Progress Notes (Signed)
Progress Note    Location:  [facility] Room Number: AL4 Place of Service:  ALF [phone]) Provider:  Murray Hodgkins, MD  Patient Care Team: Kimber Relic, MD as PCP - General (Internal Medicine) [facility] Johnney Ou, NP as Nurse Practitioner (Nurse Practitioner) Lyn Records, MD as Consulting Physician (Cardiology) Barron Alvine, MD as Consulting Physician (Urology) Francisca December, MD (Inactive) as Attending Physician (Cardiology) Leta Speller, MD as Consulting Physician (Dermatology) Elise Benne, MD as Consulting Physician (Ophthalmology)  Extended Emergency Contact Information Primary Emergency Contact: Mortimore,Janet Address: [address]          [place], [place] [address] [place] of [place] Home Phone: [phone] Mobile Phone: [phone] Relation: Daughter  Code Status:  DNR Goals of care: Advanced Directive information Advanced Directives 07/03/2016  Does Patient Have a Medical Advance Directive? Yes  Type of Estate agent of [place];Out of facility DNR (pink MOST or yellow form)  Does patient want to make changes to medical advance directive? -  Copy of Healthcare Power of Attorney in Chart? Yes  Pre-existing out of facility DNR order (yellow form or pink MOST form) Yellow form placed in chart (order not valid for inpatient use);Pink MOST form placed in chart (order not valid for inpatient use)     Chief Complaint  Patient presents with  . Medical Management of Chronic Issues    routine visit    HPI:  Pt is a 83 y.o. male seen today for medical management of chronic diseases.    Having more discomfort in his back. Higher dose of methadone made him feel off balance and dizzy, so he is back on methadone 7.5 in Am and 5 mg hs. He is applying the Aspercream to his back twice daily and finds it helpful. The physical therapy feels good the day he is there, but the following day he feels worse. Pains  never seem to interfere with walking. Standing still makes his back worse. Known SS and foraminal stenoses.  Previous problem with left arm discomfort is resolved.  Hypertensive kidney disease with CKD (chronic kidney disease) stage V (HCC) - stable lab  Essential hypertension - controlled  Edema, unspecified type - stable  Systolic congestive heart failure, unspecified congestive heart failure chronicity (HCC) - controlled     Past Medical History:  Diagnosis Date  . Abnormality of gait 06/11/2012  . Alcohol abuse 04/26/2012  . Allergy, unspecified not elsewhere classified 04/28/2012  . Atrial fibrillation (HCC) 04/28/2012  . BPH (benign prostatic hyperplasia)   . CAD (coronary artery disease)    s/p CABG 2005  . Cardiac pacemaker in situ 04/28/2012  . CHF (congestive heart failure) (HCC) 04/23/2012  . Chronic airway obstruction, not elsewhere classified 04/26/2012  . Congestive heart failure, unspecified 04/28/2012  . Coronary atherosclerosis of native coronary artery 04/28/2012  . Degenerative joint disease 04/21/2012  . Depression    mild per pt  . Edema 04/28/2012  . Essential hypertension 10/17/2012  . Fall 04/21/2012   Prolonged downtime 04/20/2012   . GERD (gastroesophageal reflux disease) 09/13/2012  . Hip pain, right 09/02/2013  . History of rhabdomyolysis   . HTN (hypertension)   . Hyperlipidemia   . Hypertrophy of prostate without urinary obstruction and other lower urinary tract symptoms (LUTS) 04/28/2012  . Ingrowing nail 06/11/2012  . Insomnia 11/15/2012  . Insomnia, unspecified 04/28/2012  . Lumbago   . Macular degeneration   . Major depressive disorder, single episode, unspecified  04/28/2012  . Muscle weakness (generalized) 04/26/2012  . NSTEMI (non-ST elevated myocardial infarction) (HCC) 04/23/2012   Related to the stress of rhabdomyolysis and prolonged downtime prior to admission in a patient with known coronary artery disease   . Osteoarthritis   . Other specified disease  of white blood cells 04/26/2012  . Pacemaker 04/21/2012   Near EOL.   . Paroxysmal atrial tachycardia (HCC) 04/21/2012   Brief episodes. Not felt to be an anticoagulation candidate because of age and frailty   . Pericarditis 04/21/2012  . Personal history of fall 04/28/2012  . Reflux esophagitis 04/28/2012  . Rhabdomyolysis   . Seborrheic eczema 12/03/2012  . Sick sinus syndrome (HCC) 10/14/2003; 10/29/2012   MDT EnPulse implanted by Dr Amil Amen for SSS; generator change 10/29/2012 by Dr Johney Frame MDT Jana Half pacemaker  . Sporotrichosis    remote  . Synovial cyst of wrist 11/10/2014   Left wrist    . Tachy-brady syndrome (HCC) 04/21/2012   Pacemaker is near EOL   . Unspecified constipation 09/13/2012  . Unspecified glaucoma(365.9) 04/28/2012   Past Surgical History:  Procedure Laterality Date  . ANGIOPLASTY  9/11/190   Dr. Katrinka Blazing  . APPENDECTOMY    . BACK SURGERY  03/11/2001  . bilateral knee arthroplasty  09/28/83; 05/17/1992   right then left  . CARPAL TUNNEL RELEASE Left   . CARPAL TUNNEL RELEASE Right 2007  . CATARACT EXTRACTION Right 10/30/1996  . CORONARY ARTERY BYPASS GRAFT  2005  . CYSTOSCOPY  08/14/85  . ELBOW SURGERY     repair  . EPIDIDYMIS SURGERY  09/17/1985  . HERNIA REPAIR Right 05/19/1976  . PACEMAKER GENERATOR CHANGE  10/29/12   Dr. Johney Frame  . PACEMAKER GENERATOR CHANGE N/A 10/29/2012   Procedure: PACEMAKER GENERATOR CHANGE;  Surgeon: Hillis Range, MD;  Location[facility] C[facility] LAB;  Service: Cardiovascular;  Laterality: N/A;  . PACEMAKER INSERTION  10/14/2003;10/29/2012   MDT Implanted by Dr Amil Amen for SSS; generator change 10/29/2012 by Dr Johney Frame [employer]  . REPLACEMENT TOTAL KNEE BILATERAL  1992 and 1996  . ROTATOR CUFF REPAIR Right 2008  . SHOULDER OPEN ROTATOR CUFF REPAIR Left 02/07/1996  . SIGMOIDOSCOPY  08/26/2001  . TOE SURGERY Right 2005   correction  . TRANSURETHRAL RESECTION OF PROSTATE  07/27/1983  . YAG LASER APPLICATION Right 12/26/1973   eye  . YAG LASER APPLICATION Right  04/30/2001  . YAG LASER APPLICATION Left 05/07/2001    Allergies  Allergen Reactions  . Aleve [Naproxen Sodium]   . Celebrex [Celecoxib]     Lips swelled  . Sulfur     Lips swelled  . Vioxx [Rofecoxib] Other (See Comments)    unknown    Allergies as of 07/03/2016      Reactions   Aleve [naproxen Sodium]    Celebrex [celecoxib]    Lips swelled   Sulfur    Lips swelled   Vioxx [rofecoxib] Other (See Comments)   unknown      Medication List       Accurate as of 07/03/16  9:51 AM. Always use your most recent med list.          acetaminophen 325 MG tablet Commonly known as:  TYLENOL Take 650 mg by mouth every 4 (four) hours as needed for mild pain.   amoxicillin 500 MG tablet Commonly known as:  AMOXIL Take 500 mg by mouth. Take one tablet one hour prior to surgery   aspirin 81 MG tablet Take 81 mg by mouth daily.  bimatoprost 0.01 % Soln Commonly known as:  LUMIGAN Place 1 drop into both eyes at bedtime.   carbamide peroxide 6.5 % otic solution Commonly known as:  DEBROX 5 drops into affected ear for 3 nights as needed for ear wax at bedtime   DERMAZENE 1 % Crea Generic drug:  Iodoquinol-HC Apply topically daily as needed. For rash in groin area.   diclofenac sodium 1 % Gel Commonly known as:  VOLTAREN Apply four times a day as needed for pain   docusate sodium 100 MG capsule Commonly known as:  COLACE Take 100 mg by mouth 2 (two) times daily as needed for mild constipation.   finasteride 5 MG tablet Commonly known as:  PROSCAR Take one tablet by mouth once daily   fluticasone 50 MCG/ACT nasal spray Commonly known as:  FLONASE Place 1 spray into both nostrils daily.   furosemide 20 MG tablet Commonly known as:  LASIX Take one tablet by mouth once daily. One twice weekly as needed for increased weight 2-3 lbs   guaiFENesin 600 MG 12 hr tablet Commonly known as:  MUCINEX Take 600 mg by mouth 2 (two) times daily.   ICAPS PO Take by mouth. Take  one daily   KLOR-CON M10 10 MEQ tablet Generic drug:  potassium chloride Take 1 tablet by mouth twice daily  as needed with Lasix   lidocaine 4 % cream Commonly known as:  ASPERCREME W/LIDOCAINE Apply morning and evening to painful area of the lower back   lisinopril 20 MG tablet Commonly known as:  PRINIVIL,ZESTRIL Take one tablet daily for blood pressure   loratadine 10 MG tablet Commonly known as:  CLARITIN Take 10 mg by mouth daily. Reported on 07/13/2015   methadone 5 MG tablet Commonly known as:  DOLOPHINE Take 5 mg by mouth. Take one 5 mg tablet at 9:00 am. Take 1 1/2 tablet = 75 mg at 9:00 pm for pain   metoprolol tartrate 25 MG tablet Commonly known as:  LOPRESSOR Take 25 mg by mouth 2 (two) times daily.   naproxen sodium 220 MG tablet Commonly known as:  ANAPROX Take 220 mg by mouth 2 (two) times daily.   nitroGLYCERIN 0.4 MG SL tablet Commonly known as:  NITROSTAT Place 0.4 mg under the tongue every 5 (five) minutes as needed. Reported on 07/13/2015   omeprazole 20 MG capsule Commonly known as:  PRILOSEC Take 20 mg by mouth daily.   Polyethyl Glycol-Propyl Glycol 0.4-0.3 % Soln Apply to eye. One drop both eyes twice daily   polyethylene glycol packet Commonly known as:  MIRALAX / GLYCOLAX Take 17 g by mouth daily.   senna-docusate 8.6-50 MG tablet Commonly known as:  Senokot-S Take 1 tablet by mouth. Take one tablet at bedtime   sertraline 50 MG tablet Commonly known as:  ZOLOFT Take 50 mg by mouth. Take one tablet at bedtime   tamsulosin 0.4 MG Caps capsule Commonly known as:  FLOMAX Take 0.4 mg by mouth daily.   zolpidem 5 MG tablet Commonly known as:  AMBIEN Take one tablet by mouth at bedtime for rest       Review of Systems  Constitutional: Negative.  Negative for activity change, appetite change, fatigue and unexpected weight change.  HENT: Negative.  Negative for hearing loss, sinus pressure and sore throat.        02/19/16 nose  bleed, left nostril, stopped by pressure dressing  Eyes:       Hx macular degeneration. Receiving injections every  6-8 weeks.  Respiratory: Negative for apnea, cough, chest tightness, shortness of breath, wheezing and stridor.   Cardiovascular: Positive for leg swelling. Negative for chest pain and palpitations.  Gastrointestinal: Negative for abdominal distention, abdominal pain, nausea and vomiting.       Flatulence  Endocrine: Negative.   Genitourinary: Negative for decreased urine volume, frequency and urgency.       BPH.  Musculoskeletal: Positive for back pain and gait problem (uunstable. uses walker). Negative for neck pain and neck stiffness.       X-ray reports from 2009 and CT of the lumbar spine shw this patient has foraminal stenoses and  spinal stenosis now. There is multiple intervertebral degenerative disc disease. He has had steroid injections at [facility], Dr. Naaman Plummer. Some pain radiates down the right leg. He is now comfortable moving around using his 4 wheel walker. He is able to get in bed and lay down comfortably overnight.  Skin:       Dry. Itching on dorsum of hands.  Allergic/Immunologic: Negative.   Neurological: Positive for light-headedness.  Hematological: Negative.   Psychiatric/Behavioral: Positive for sleep disturbance (insomnia. Ambien helps.).    Immunization History  Administered Date(s) Administered  . Influenza Whole 02/02/2013  . Influenza-Unspecified 01/29/2014, 01/28/2015, 02/03/2016  . PPD Test 05/09/2012  . Pneumococcal Polysaccharide-23 04/24/2009  . Tdap 04/21/2012  . Zoster 04/25/2007   Pertinent  Health Maintenance Due  Topic Date Due  . PNA vac Low Risk Adult (2 of 2 - PCV13) 04/24/2010  . INFLUENZA VACCINE  Completed   Fall Risk  12/14/2015 07/13/2015 12/15/2014 08/04/2014  Falls in the past year? Yes No No No  Number falls in past yr: 1 - - -  Injury with Fall? No - - -  Risk for fall due to : - - - History of  fall(s);Impaired balance/gait     Vitals:   07/03/16 0929  BP: 134/61  Pulse: 63  Resp: 14  Temp: (!) 95.6 F (35.3 C)  SpO2: 95%  Weight: 182 lb 9.6 oz (82.8 kg)  Height: 5\' 8"  (1.727 m)   Body mass index is 27.76 kg/m. Physical Exam  Constitutional: He is oriented to person, place, and time. He appears well-nourished. No distress.  Frail  HENT:  Head: Normocephalic and atraumatic.  Right Ear: External ear normal.  Left Ear: External ear normal.  Nose: Nose normal.  Mouth/Throat: Oropharynx is clear and moist.  Mild decrease in hearing. Drooling.  Eyes: Conjunctivae and EOM are normal. Pupils are equal, round, and reactive to light.  Neck: No JVD present. No tracheal deviation present. No thyromegaly present.  Cardiovascular: Normal rate, regular rhythm and intact distal pulses.  Exam reveals no gallop and no friction rub.   No murmur heard. Pulmonary/Chest: Effort normal. No respiratory distress. He has no wheezes. He has rales (Right chest).  Abdominal: He exhibits no distension and no mass. There is no tenderness.  Large upper diastasis recti  Musculoskeletal: Normal range of motion. He exhibits edema (1+ bipedal) and tenderness.  Using walker. Bilateral knee prosthesis. Tender lumbar spine and shading towards the left side of the small of the back. BLE trace edema  Lymphadenopathy:    He has no cervical adenopathy.  Neurological: He is alert and oriented to person, place, and time. No cranial nerve deficit. Coordination normal.  Diminished sensation to vibration in the right foot.  Skin: Skin is dry. No rash noted. No erythema. No pallor.  Scars at sternum from CABG,  right scapula from cyst removal, and both knees from TKR. Multiple SK.  Psychiatric: He has a normal mood and affect. His behavior is normal. Judgment and thought content normal.    Labs reviewed:  Recent Labs  05/11/16 05/22/16 06/22/16  NA 136* 137 137  K 5.2 4.6 4.7  BUN 42* 40* 37*    CREATININE 1.5* 1.5* 1.3    Recent Labs  11/11/15 05/04/16 05/11/16  AST 13*  --  18  ALT 10 10  --   ALKPHOS 57  --  62    Recent Labs  11/11/15 05/11/16  WBC 5.9 6.2  HGB 11.9* 12.1*  HCT 35*  --   PLT 157 147*   Lab Results  Component Value Date   TSH 3.06 05/11/2016   Lab Results  Component Value Date   HGBA1C 6.1 (H) 04/22/2012   Lab Results  Component Value Date   CHOL 149 04/22/2012   HDL 35 (L) 04/22/2012   LDLCALC 96 04/22/2012   TRIG 89 04/22/2012   CHOLHDL 4.3 04/22/2012    Assessment/Plan 1. Low back pain with sciatica, sciatica laterality unspecified, unspecified back pain laterality, unspecified chronicity Increase Aspercream to apply qid to back. Consider TENS in future if needed.  2. Spinal stenosis of lumbar region, unspecified whether neurogenic claudication present Source of his back pains  3. Hypertensive kidney disease with CKD (chronic kidney disease) stage V (HCC) stable  4. Essential hypertension contolled  5. Edema, unspecified type unchanged  6. Systolic congestive heart failure, unspecified congestive heart failure chronicity (HCC) Controlled onn current medds.

## 2016-07-04 ENCOUNTER — Telehealth: Payer: Self-pay | Admitting: Cardiology

## 2016-07-04 ENCOUNTER — Ambulatory Visit (INDEPENDENT_AMBULATORY_CARE_PROVIDER_SITE_OTHER): Payer: Medicare Other | Admitting: *Deleted

## 2016-07-04 DIAGNOSIS — M6281 Muscle weakness (generalized): Secondary | ICD-10-CM | POA: Diagnosis not present

## 2016-07-04 DIAGNOSIS — I495 Sick sinus syndrome: Secondary | ICD-10-CM | POA: Diagnosis not present

## 2016-07-04 DIAGNOSIS — M545 Low back pain: Secondary | ICD-10-CM | POA: Diagnosis not present

## 2016-07-04 DIAGNOSIS — M25552 Pain in left hip: Secondary | ICD-10-CM | POA: Diagnosis not present

## 2016-07-04 NOTE — Telephone Encounter (Signed)
Confirmed remote transmission w/ pt nurse.   

## 2016-07-06 DIAGNOSIS — M25552 Pain in left hip: Secondary | ICD-10-CM | POA: Diagnosis not present

## 2016-07-06 DIAGNOSIS — M545 Low back pain: Secondary | ICD-10-CM | POA: Diagnosis not present

## 2016-07-06 DIAGNOSIS — M6281 Muscle weakness (generalized): Secondary | ICD-10-CM | POA: Diagnosis not present

## 2016-07-11 DIAGNOSIS — M545 Low back pain: Secondary | ICD-10-CM | POA: Diagnosis not present

## 2016-07-11 DIAGNOSIS — M6281 Muscle weakness (generalized): Secondary | ICD-10-CM | POA: Diagnosis not present

## 2016-07-11 DIAGNOSIS — M25552 Pain in left hip: Secondary | ICD-10-CM | POA: Diagnosis not present

## 2016-07-11 NOTE — Progress Notes (Signed)
Remote pacemaker transmission.   

## 2016-07-12 ENCOUNTER — Encounter: Payer: Self-pay | Admitting: Cardiology

## 2016-07-17 DIAGNOSIS — M25552 Pain in left hip: Secondary | ICD-10-CM | POA: Diagnosis not present

## 2016-07-17 DIAGNOSIS — M545 Low back pain: Secondary | ICD-10-CM | POA: Diagnosis not present

## 2016-07-17 DIAGNOSIS — M6281 Muscle weakness (generalized): Secondary | ICD-10-CM | POA: Diagnosis not present

## 2016-07-17 LAB — CUP PACEART REMOTE DEVICE CHECK
Battery Remaining Longevity: 78 mo
Battery Voltage: 2.79 V
Brady Statistic AP VS Percent: 0 %
Brady Statistic AS VP Percent: 1 %
Date Time Interrogation Session: 20180315122441
Implantable Lead Implant Date: 20050622
Implantable Lead Location: 753860
Implantable Pulse Generator Implant Date: 20140708
Lead Channel Pacing Threshold Amplitude: 0.5 V
Lead Channel Pacing Threshold Pulse Width: 0.4 ms
Lead Channel Setting Pacing Amplitude: 2 V
Lead Channel Setting Pacing Pulse Width: 0.4 ms
MDC IDC LEAD IMPLANT DT: 20050622
MDC IDC LEAD LOCATION: 753859
MDC IDC MSMT BATTERY IMPEDANCE: 406 Ohm
MDC IDC MSMT LEADCHNL RA IMPEDANCE VALUE: 436 Ohm
MDC IDC MSMT LEADCHNL RA PACING THRESHOLD AMPLITUDE: 0.5 V
MDC IDC MSMT LEADCHNL RA PACING THRESHOLD PULSEWIDTH: 0.4 ms
MDC IDC MSMT LEADCHNL RV IMPEDANCE VALUE: 667 Ohm
MDC IDC SET LEADCHNL RV PACING AMPLITUDE: 2.5 V
MDC IDC SET LEADCHNL RV SENSING SENSITIVITY: 4 mV
MDC IDC STAT BRADY AP VP PERCENT: 99 %
MDC IDC STAT BRADY AS VS PERCENT: 0 %

## 2016-07-19 DIAGNOSIS — M545 Low back pain: Secondary | ICD-10-CM | POA: Diagnosis not present

## 2016-07-19 DIAGNOSIS — M25552 Pain in left hip: Secondary | ICD-10-CM | POA: Diagnosis not present

## 2016-07-19 DIAGNOSIS — M6281 Muscle weakness (generalized): Secondary | ICD-10-CM | POA: Diagnosis not present

## 2016-07-24 ENCOUNTER — Other Ambulatory Visit: Payer: Self-pay | Admitting: *Deleted

## 2016-07-24 MED ORDER — METHADONE HCL 5 MG PO TABS: ORAL_TABLET | ORAL | 0 refills | Status: DC

## 2016-07-24 NOTE — Telephone Encounter (Signed)
[facility] #[phone] Fax: [phone]  

## 2016-08-01 ENCOUNTER — Encounter: Payer: Self-pay | Admitting: Internal Medicine

## 2016-08-01 ENCOUNTER — Non-Acute Institutional Stay: Payer: Medicare Other | Admitting: Internal Medicine

## 2016-08-01 VITALS — BP 120/62 | HR 62 | Temp 97.7°F | Ht 68.0 in | Wt 182.6 lb

## 2016-08-01 DIAGNOSIS — M5440 Lumbago with sciatica, unspecified side: Secondary | ICD-10-CM | POA: Diagnosis not present

## 2016-08-01 DIAGNOSIS — I10 Essential (primary) hypertension: Secondary | ICD-10-CM | POA: Diagnosis not present

## 2016-08-01 DIAGNOSIS — I5020 Unspecified systolic (congestive) heart failure: Secondary | ICD-10-CM | POA: Diagnosis not present

## 2016-08-01 DIAGNOSIS — I2510 Atherosclerotic heart disease of native coronary artery without angina pectoris: Secondary | ICD-10-CM

## 2016-08-01 DIAGNOSIS — M48061 Spinal stenosis, lumbar region without neurogenic claudication: Secondary | ICD-10-CM

## 2016-08-01 NOTE — Progress Notes (Signed)
[place] Room Number: PV94  Place of Service: Clinic (12)     Allergies  Allergen Reactions  . Aleve [Naproxen Sodium]   . Celebrex [Celecoxib]     Lips swelled  . Sulfur     Lips swelled  . Vioxx [Rofecoxib] Other (See Comments)    unknown    Chief Complaint  Patient presents with  . Medical Management of Chronic Issues    medication management of low back pain, spinal stenosis, CKD, blood pressure.     HPI:   Low back pain with sciatica, sciatica laterality unspecified, unspecified back pain laterality, unspecified chronicity - Last seen 07/03/16 for increased back pain. I recommended that he use Aspercream with lidocaine qid to the back  Spinal stenosis of lumbar region, unspecified whether neurogenic claudication present  Coronary artery disease involving native coronary artery of native heart without angina pectoris - no symptoms  Systolic congestive heart failure, unspecified congestive heart failure chronicity ([place]) - compensated  Essential hypertension - controlled    Medications: Patient's Medications  New Prescriptions   No medications on file  Previous Medications   ACETAMINOPHEN (TYLENOL) 325 MG TABLET    Take 650 mg by mouth every 4 (four) hours as needed for mild pain.   AMOXICILLIN (AMOXIL) 500 MG TABLET    Take 500 mg by mouth. Take one tablet one hour prior to surgery   ASPIRIN 81 MG TABLET    Take 81 mg by mouth daily.   BIMATOPROST (LUMIGAN) 0.01 % SOLN    Place 1 drop into both eyes at bedtime.    CARBAMIDE PEROXIDE (DEBROX) 6.5 % OTIC SOLUTION    5 drops into affected ear for 3 nights as needed for ear wax at bedtime   DICLOFENAC SODIUM (VOLTAREN) 1 % GEL    Apply four times a day as needed for pain   DOCUSATE SODIUM (COLACE) 100 MG CAPSULE    Take 100 mg by mouth 2 (two) times daily as needed for mild constipation.   FINASTERIDE (PROSCAR) 5 MG TABLET    Take one tablet by mouth once daily   FLUTICASONE (FLONASE) 50 MCG/ACT  NASAL SPRAY    Place 1 spray into both nostrils daily.    FUROSEMIDE (LASIX) 20 MG TABLET    Take one tablet by mouth once daily. One twice weekly as needed for increased weight 2-3 lbs   GUAIFENESIN (MUCINEX) 600 MG 12 HR TABLET    Take 600 mg by mouth 2 (two) times daily.    IODOQUINOL-HC (DERMAZENE) 1 % CREA    Apply topically. Apply twice a day to  rash in groin area.   KLOR-CON M10 10 MEQ TABLET    Take 1 tablet by mouth twice daily  as needed with Lasix   LIDOCAINE (ASPERCREME W/LIDOCAINE) 4 % CREAM    Apply morning and evening to painful area of the lower back   LISINOPRIL (PRINIVIL,ZESTRIL) 20 MG TABLET    Take one tablet daily for blood pressure   LORATADINE (CLARITIN) 10 MG TABLET    Take 10 mg by mouth daily. Reported on 07/13/2015   METHADONE (DOLOPHINE) 5 MG TABLET    Take one tablet by mouth every morning for pain; Take One and Half tablet (7.70m) by mouth every evening for pain.   METOPROLOL TARTRATE (LOPRESSOR) 25 MG TABLET    Take 25 mg by mouth 2 (two) times daily.   MULTIPLE VITAMINS-MINERALS (ICAPS PO)    Take by mouth. Take  one daily   NAPROXEN SODIUM (ANAPROX) 220 MG TABLET    Take 220 mg by mouth 2 (two) times daily.   NITROGLYCERIN (NITROSTAT) 0.4 MG SL TABLET    Place 0.4 mg under the tongue every 5 (five) minutes as needed. Reported on 07/13/2015   OMEPRAZOLE (PRILOSEC) 20 MG CAPSULE    Take 20 mg by mouth daily.   POLYETHYL GLYCOL-PROPYL GLYCOL 0.4-0.3 % SOLN    Apply to eye. One drop both eyes twice daily   POLYETHYLENE GLYCOL (MIRALAX / GLYCOLAX) PACKET    Take 17 g by mouth daily.   SENNA-DOCUSATE (SENOKOT-S) 8.6-50 MG TABLET    Take 1 tablet by mouth. Take one tablet at bedtime   SERTRALINE (ZOLOFT) 50 MG TABLET    Take 50 mg by mouth. Take one tablet at bedtime   SODIUM CHLORIDE (OCEAN) 0.65 % SOLN NASAL SPRAY    1 spray. Spray three times a day in both nostrils   TAMSULOSIN HCL (FLOMAX) 0.4 MG CAPS    Take 0.4 mg by mouth daily.   ZOLPIDEM (AMBIEN) 5 MG TABLET     Take one tablet by mouth at bedtime for rest  Modified Medications   No medications on file  Discontinued Medications   No medications on file     Review of Systems  Constitutional: Negative.  Negative for activity change, appetite change, fatigue and unexpected weight change.  HENT: Negative.  Negative for hearing loss, sinus pressure and sore throat.        02/19/16 nose bleed, left nostril, stopped by pressure dressing  Eyes:       Hx macular degeneration. Receiving injections every 6-8 weeks.  Respiratory: Negative for apnea, cough, chest tightness, shortness of breath, wheezing and stridor.   Cardiovascular: Positive for leg swelling. Negative for chest pain and palpitations.  Gastrointestinal: Negative for abdominal distention, abdominal pain, nausea and vomiting.       Flatulence  Endocrine: Negative.   Genitourinary: Negative for decreased urine volume, frequency and urgency.       BPH.  Musculoskeletal: Positive for back pain and gait problem (uunstable. uses walker). Negative for neck pain and neck stiffness.       X-ray reports from 2009 and CT of the lumbar spine shw this patient has foraminal stenoses and  spinal stenosis now. There is multiple intervertebral degenerative disc disease. He has had steroid injections at [place], Dr. Laurence Spates. Some pain radiates down the right leg. He is now comfortable moving around using his 4 wheel walker. He is able to get in bed and lay down comfortably overnight.  Skin:       Dry. Itching on dorsum of hands.  Allergic/Immunologic: Negative.   Neurological: Positive for light-headedness.  Hematological: Negative.   Psychiatric/Behavioral: Positive for sleep disturbance (insomnia. Ambien helps.).    Vitals:   08/01/16 0851  BP: 120/62  Pulse: 62  Temp: 97.7 F (36.5 C)  TempSrc: Oral  SpO2: 99%  Weight: 182 lb 9.6 oz (82.8 kg)  Height: _0  (1.727 m)   Wt Readings from Last 3 Encounters:  08/01/16  182 lb 9.6 oz (82.8 kg)  07/03/16 182 lb 9.6 oz (82.8 kg)  06/02/16 181 lb 3.2 oz (82.2 kg)    Body mass index is 27.76 kg/m.  Physical Exam  Constitutional: He is oriented to person, place, and time. He appears well-nourished. No distress.  Frail  HENT:  Head: Normocephalic and atraumatic.  Right Ear: External ear normal.  Left  Ear: External ear normal.  Nose: Nose normal.  Mouth/Throat: Oropharynx is clear and moist.  Mild decrease in hearing. Drooling.  Eyes: Conjunctivae and EOM are normal. Pupils are equal, round, and reactive to light.  Neck: No JVD present. No tracheal deviation present. No thyromegaly present.  Cardiovascular: Normal rate, regular rhythm and intact distal pulses.  Exam reveals no gallop and no friction rub.   No murmur heard. Pulmonary/Chest: Effort normal. No respiratory distress. He has no wheezes. He has rales (Right chest).  Abdominal: He exhibits no distension and no mass. There is no tenderness.  Large upper diastasis recti  Musculoskeletal: Normal range of motion. He exhibits edema (1+ bipedal) and tenderness.  Using walker. Bilateral knee prosthesis. Tender lumbar spine and shading towards the left side of the small of the back. BLE trace edema  Lymphadenopathy:    He has no cervical adenopathy.  Neurological: He is alert and oriented to person, place, and time. No cranial nerve deficit. Coordination normal.  Diminished sensation to vibration in the right foot.  Skin: Skin is dry. No rash noted. No erythema. No pallor.  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 Multiple SK.  Psychiatric: He has a normal mood and affect. His behavior is normal. Judgment and thought content normal.     Labs reviewed: Lab Summary Latest Ref Rng & Units 06/22/2016 05/22/2016 05/11/2016 11/11/2015  Hemoglobin 13.5 - 17.5 g/dL (None) (None) 12.1(A) 11.9(A)  Hematocrit 41 - 53 % (None) (None) (None) 35(A)  White count  10:3/mL (None) (None) 6.2 5.9  Platelet count 150 - 399 K/L (None) (None) 147(A) 157  Sodium 137 - 147 mmol/L 137 137 136(A) 136(A)  Potassium 3.4 - 5.3 mmol/L 4.7 4.6 5.2 4.9  Calcium - (None) (None) (None) (None)  Phosphorus - (None) (None) (None) (None)  Creatinine 0.6 - 1.3 mg/dL 1.3 1.5(A) 1.5(A) 1.4(A)  AST 14 - 40 U/L (None) (None) 18 13(A)  Alk Phos 25 - 125 U/L (None) (None) 62 57  Bilirubin - (None) (None) (None) (None)  Glucose mg/dL 97 101 109 106  Cholesterol - (None) (None) (None) (None)  HDL cholesterol - (None) (None) (None) (None)  Triglycerides - (None) (None) (None) (None)  LDL Direct - (None) (None) (None) (None)  LDL Calc - (None) (None) (None) (None)  Total protein - (None) (None) (None) (None)  Albumin - (None) (None) (None) (None)  Some recent data might be hidden   Lab Results  Component Value Date   TSH 3.06 05/11/2016   Lab Results  Component Value Date   BUN 37 (A) 06/22/2016   BUN 40 (A) 05/22/2016   BUN 42 (A) 05/11/2016   Lab Results  Component Value Date   CREATININE 1.3 06/22/2016   CREATININE 1.5 (A) 05/22/2016   CREATININE 1.5 (A) 05/11/2016   Lab Results  Component Value Date   HGBA1C 6.1 (H) 04/22/2012       Assessment/Plan  1. Low back pain with sciatica, sciatica laterality unspecified, unspecified back pain laterality, unspecified chronicity -PT consult to start use of TENS  2. Spinal stenosis of lumbar region, unspecified whether neurogenic claudication present  3. Coronary artery disease involving native coronary artery of native heart without angina pectoris asymptomatic  4. Systolic congestive heart failure, unspecified congestive heart failure chronicity ([place]) The current medical regimen is effective;  continue present plan and medications.  5. Essential hypertension The current medical regimen is effective;  continue present plan and medications.

## 2016-08-04 ENCOUNTER — Encounter: Payer: Self-pay | Admitting: Nurse Practitioner

## 2016-08-10 DIAGNOSIS — H353211 Exudative age-related macular degeneration, right eye, with active choroidal neovascularization: Secondary | ICD-10-CM | POA: Diagnosis not present

## 2016-09-01 DIAGNOSIS — B351 Tinea unguium: Secondary | ICD-10-CM | POA: Diagnosis not present

## 2016-09-01 DIAGNOSIS — M79671 Pain in right foot: Secondary | ICD-10-CM | POA: Diagnosis not present

## 2016-09-01 DIAGNOSIS — M79672 Pain in left foot: Secondary | ICD-10-CM | POA: Diagnosis not present

## 2016-09-12 ENCOUNTER — Encounter: Payer: Self-pay | Admitting: Nurse Practitioner

## 2016-09-12 ENCOUNTER — Non-Acute Institutional Stay: Payer: Medicare Other | Admitting: Nurse Practitioner

## 2016-09-12 DIAGNOSIS — F320 Major depressive disorder, single episode, mild: Secondary | ICD-10-CM | POA: Diagnosis not present

## 2016-09-12 DIAGNOSIS — R609 Edema, unspecified: Secondary | ICD-10-CM

## 2016-09-12 DIAGNOSIS — N400 Enlarged prostate without lower urinary tract symptoms: Secondary | ICD-10-CM | POA: Diagnosis not present

## 2016-09-12 DIAGNOSIS — N185 Chronic kidney disease, stage 5: Secondary | ICD-10-CM

## 2016-09-12 DIAGNOSIS — I120 Hypertensive chronic kidney disease with stage 5 chronic kidney disease or end stage renal disease: Secondary | ICD-10-CM | POA: Diagnosis not present

## 2016-09-12 DIAGNOSIS — K5900 Constipation, unspecified: Secondary | ICD-10-CM

## 2016-09-12 DIAGNOSIS — I10 Essential (primary) hypertension: Secondary | ICD-10-CM

## 2016-09-12 DIAGNOSIS — I471 Supraventricular tachycardia: Secondary | ICD-10-CM

## 2016-09-12 DIAGNOSIS — I509 Heart failure, unspecified: Secondary | ICD-10-CM

## 2016-09-12 DIAGNOSIS — M150 Primary generalized (osteo)arthritis: Secondary | ICD-10-CM | POA: Diagnosis not present

## 2016-09-12 DIAGNOSIS — F5101 Primary insomnia: Secondary | ICD-10-CM

## 2016-09-12 DIAGNOSIS — M159 Polyosteoarthritis, unspecified: Secondary | ICD-10-CM

## 2016-09-12 NOTE — Assessment & Plan Note (Signed)
Controlled, continue Lisinopril 20mg , Metoprolol 25mg  bid, Furosemide 20mg  daily.

## 2016-09-12 NOTE — Assessment & Plan Note (Signed)
Continue Tamsulosin 0.4mg and Finasteride 5mg daily. Stable.  

## 2016-09-12 NOTE — Assessment & Plan Note (Signed)
Compensated clinically, continue Furosemide 20mg  daily, 05/22/16 Na 137, K 4.6, Bun 40, creat 1.52, BNP 206.4, update CBC CMP TSH

## 2016-09-12 NOTE — Assessment & Plan Note (Signed)
trace in BLE, continue Furosemide 20mg daily. 

## 2016-09-12 NOTE — Assessment & Plan Note (Signed)
Stable, continue MiraLax and Colace

## 2016-09-12 NOTE — Progress Notes (Signed)
Location:  [facility] Room Number: 4 Place of Service:  SNF (31) Provider:  Mast, Manxie  NP  Kimber RelicGreen, Izac G, MD  Patient Care Team: Kimber RelicGreen, Irby G, MD as PCP - General (Internal Medicine) Shelva MajesticWest, [facility] Mast, Man X, NP as Nurse Practitioner (Nurse Practitioner) Lyn RecordsSmith, Henry W, MD as Consulting Physician (Cardiology) Barron AlvineGrapey, David, MD as Consulting Physician (Urology) Francisca DecemberEdmunds, John H., MD (Inactive) as Attending Physician (Cardiology) Leta SpellerHouston, Frank, MD as Consulting Physician (Dermatology) Elise BenneGould, Sigmund, MD as Consulting Physician (Ophthalmology)  Extended Emergency Contact Information Primary Emergency Contact: Mortimore,Janet Address: [address]          [place], [place] [id number] [place] Home Phone: [phone] Mobile Phone: [phone] Relation: Daughter  Code Status:  DNR Goals of care: Advanced Directive information Advanced Directives 09/12/2016  Does Patient Have a Medical Advance Directive? Yes  Type of Estate agentAdvance Directive Healthcare Power of [place];Out of facility DNR (pink MOST or yellow form)  Does patient want to make changes to medical advance directive? No - Patient declined  Copy of Healthcare Power of Attorney in Chart? Yes  Pre-existing out of facility DNR order (yellow form or pink MOST form) Yellow form placed in chart (order not valid for inpatient use)     Chief Complaint  Patient presents with  . Medical Management of Chronic Issues    HPI:  Pt is a [age] y.o. male seen today for medical management of chronic diseases.     Past Medical History:  Diagnosis Date  . Abnormality of gait 06/11/2012  . Alcohol abuse 04/26/2012  . Allergy, unspecified not elsewhere classified 04/28/2012  . Atrial fibrillation (HCC) 04/28/2012  . BPH (benign prostatic hyperplasia)   . CAD (coronary artery disease)    s/p CABG 2005  . Cardiac pacemaker in situ 04/28/2012  . CHF (congestive heart failure) (HCC) 04/23/2012    . Chronic airway obstruction, not elsewhere classified 04/26/2012  . Congestive heart failure, unspecified 04/28/2012  . Coronary atherosclerosis of native coronary artery 04/28/2012  . Degenerative joint disease 04/21/2012  . Depression    mild per pt  . Edema 04/28/2012  . Essential hypertension 10/17/2012  . Fall 04/21/2012   Prolonged downtime 04/20/2012   . GERD (gastroesophageal reflux disease) 09/13/2012  . Hip pain, right 09/02/2013  . History of rhabdomyolysis   . HTN (hypertension)   . Hyperlipidemia   . Hypertrophy of prostate without urinary obstruction and other lower urinary tract symptoms (LUTS) 04/28/2012  . Ingrowing nail 06/11/2012  . Insomnia 11/15/2012  . Insomnia, unspecified 04/28/2012  . Lumbago   . Macular degeneration   . Major depressive disorder, single episode, unspecified 04/28/2012  . Muscle weakness (generalized) 04/26/2012  . NSTEMI (non-ST elevated myocardial infarction) (HCC) 04/23/2012   Related to the stress of rhabdomyolysis and prolonged downtime prior to admission in a patient with known coronary artery disease   . Osteoarthritis   . Other specified disease of white blood cells 04/26/2012  . Pacemaker 04/21/2012   Near EOL.   . Paroxysmal atrial tachycardia (HCC) 04/21/2012   Brief episodes. Not felt to be an anticoagulation candidate because of age and frailty   . Pericarditis 04/21/2012  . Personal history of fall 04/28/2012  . Reflux esophagitis 04/28/2012  . Rhabdomyolysis   . Seborrheic eczema 12/03/2012  . Sick sinus syndrome (HCC) 10/14/2003; 10/29/2012   MDT EnPulse implanted by Dr Amil AmenEdmunds for SSS; generator change 10/29/2012 by Dr Johney FrameAllred MDT Jana HalfSensia pacemaker  . Sporotrichosis  remote  . Synovial cyst of wrist 11/10/2014   Left wrist    . Tachy-brady syndrome (HCC) 04/21/2012   Pacemaker is near EOL   . Unspecified constipation 09/13/2012  . Unspecified glaucoma(365.9) 04/28/2012   Past Surgical History:  Procedure Laterality Date  . ANGIOPLASTY   9/11/190   Dr. Katrinka Blazing  . APPENDECTOMY    . BACK SURGERY  03/11/2001  . bilateral knee arthroplasty  09/28/83; 05/17/1992   right then left  . CARPAL TUNNEL RELEASE Left   . CARPAL TUNNEL RELEASE Right 2007  . CATARACT EXTRACTION Right 10/30/1996  . CORONARY ARTERY BYPASS GRAFT  2005  . CYSTOSCOPY  08/14/85  . ELBOW SURGERY     repair  . EPIDIDYMIS SURGERY  09/17/1985  . HERNIA REPAIR Right 05/19/1976  . PACEMAKER GENERATOR CHANGE  10/29/12   Dr. Johney Frame  . PACEMAKER GENERATOR CHANGE N/A 10/29/2012   Procedure: PACEMAKER GENERATOR CHANGE;  Surgeon: Hillis Range, MD;  Location: [facility] CATH LAB;  Service: Cardiovascular;  Laterality: N/A;  . PACEMAKER INSERTION  10/14/2003;10/29/2012   MDT Implanted by Dr Amil Amen for SSS; generator change 10/29/2012 by Dr Johney Frame [employer] Hubbell  . REPLACEMENT TOTAL KNEE BILATERAL  1992 and 1996  . ROTATOR CUFF REPAIR Right 2008  . SHOULDER OPEN ROTATOR CUFF REPAIR Left 02/07/1996  . SIGMOIDOSCOPY  08/26/2001  . TOE SURGERY Right 2005   correction  . TRANSURETHRAL RESECTION OF PROSTATE  07/27/1983  . YAG LASER APPLICATION Right 12/26/1973   eye  . YAG LASER APPLICATION Right 04/30/2001  . YAG LASER APPLICATION Left 05/07/2001    Allergies  Allergen Reactions  . Aleve [Naproxen Sodium]   . Celebrex [Celecoxib]     Lips swelled  . Sulfur     Lips swelled  . Vioxx [Rofecoxib] Other (See Comments)    unknown    Outpatient Encounter Prescriptions as of 09/12/2016  Medication Sig  . acetaminophen (TYLENOL) 325 MG tablet Take 650 mg by mouth every 4 (four) hours as needed for mild pain.  Marland Kitchen amoxicillin (AMOXIL) 500 MG tablet Take 500 mg by mouth. Take one tablet one hour prior to surgery  . aspirin 81 MG tablet Take 81 mg by mouth daily.  . bimatoprost (LUMIGAN) 0.01 % SOLN Place 1 drop into both eyes at bedtime.   . carbamide peroxide (DEBROX) 6.5 % otic solution 5 drops into affected ear for 3 nights as needed for ear wax at bedtime  . diclofenac sodium (VOLTAREN) 1 % GEL  Apply four times a day as needed for pain  . docusate sodium (COLACE) 100 MG capsule Take 100 mg by mouth 2 (two) times daily as needed for mild constipation.  . finasteride (PROSCAR) 5 MG tablet Take one tablet by mouth once daily  . fluticasone (FLONASE) 50 MCG/ACT nasal spray Place 1 spray into both nostrils daily.   . furosemide (LASIX) 20 MG tablet Take one tablet by mouth once daily. One twice weekly as needed for increased weight 2-3 lbs  . guaiFENesin (MUCINEX) 600 MG 12 hr tablet Take 600 mg by mouth 2 (two) times daily.   . Iodoquinol-HC (DERMAZENE) 1 % CREA Apply topically. Apply twice a day to  rash in groin area.  Marland Kitchen KLOR-CON M10 10 MEQ tablet Take 1 tablet by mouth twice daily  as needed with Lasix  . lidocaine (ASPERCREME W/LIDOCAINE) 4 % cream Apply morning and evening to painful area of the lower back  . lisinopril (PRINIVIL,ZESTRIL) 20 MG tablet Take one tablet daily for blood  pressure  . loratadine (CLARITIN) 10 MG tablet Take 10 mg by mouth daily. Reported on 07/13/2015  . methadone (DOLOPHINE) 5 MG tablet Take one tablet by mouth every morning for pain; Take One and Half tablet (7.5mg ) by mouth every evening for pain.  . metoprolol tartrate (LOPRESSOR) 25 MG tablet Take 25 mg by mouth 2 (two) times daily.  . Multiple Vitamins-Minerals (ICAPS PO) Take by mouth. Take one daily  . naproxen sodium (ANAPROX) 220 MG tablet Take 220 mg by mouth 2 (two) times daily.  . nitroGLYCERIN (NITROSTAT) 0.4 MG SL tablet Place 0.4 mg under the tongue every 5 (five) minutes as needed. Reported on 07/13/2015  . omeprazole (PRILOSEC) 20 MG capsule Take 20 mg by mouth daily.  Bertram Gala Glycol-Propyl Glycol 0.4-0.3 % SOLN Apply to eye. One drop both eyes twice daily  . polyethylene glycol (MIRALAX / GLYCOLAX) packet Take 17 g by mouth daily.  Marland Kitchen senna-docusate (SENOKOT-S) 8.6-50 MG tablet Take 1 tablet by mouth. Take one tablet at bedtime  . sertraline (ZOLOFT) 50 MG tablet Take 50 mg by mouth. Take  one tablet at bedtime  . sodium chloride (OCEAN) 0.65 % SOLN nasal spray 1 spray. Spray three times a day in both nostrils  . Tamsulosin HCl (FLOMAX) 0.4 MG CAPS Take 0.4 mg by mouth daily.  Marland Kitchen zolpidem (AMBIEN) 5 MG tablet Take one tablet by mouth at bedtime for rest   No facility-administered encounter medications on file as of 09/12/2016.     Review of Systems  Immunization History  Administered Date(s) Administered  . Influenza Whole 02/02/2013  . Influenza-Unspecified 01/29/2014, 01/28/2015, 02/03/2016  . PPD Test 05/09/2012  . Pneumococcal Polysaccharide-23 04/24/2009  . Tdap 04/21/2012  . Zoster 04/25/2007   Pertinent  Health Maintenance Due  Topic Date Due  . PNA vac Low Risk Adult (2 of 2 - PCV13) 04/24/2010  . INFLUENZA VACCINE  11/22/2016   Fall Risk  12/14/2015 07/13/2015 12/15/2014 08/04/2014  Falls in the past year? Yes No No No  Number falls in past yr: 1 - - -  Injury with Fall? No - - -  Risk for fall due to : - - - History of fall(s);Impaired balance/gait   Functional Status Survey:    Vitals:   09/12/16 1527  BP: (!) 114/59  Pulse: 64  Resp: 18  Temp: 98.4 F (36.9 C)  SpO2: 98%  Weight: 180 lb (81.6 kg)  Height: 5\' 8"  (1.727 m)   Body mass index is 27.37 kg/m. Physical Exam  Labs reviewed:  Recent Labs  05/11/16 05/22/16 06/22/16  NA 136* 137 137  K 5.2 4.6 4.7  BUN 42* 40* 37*  CREATININE 1.5* 1.5* 1.3    Recent Labs  11/11/15 05/04/16 05/11/16  AST 13*  --  18  ALT 10 10  --   ALKPHOS 57  --  62    Recent Labs  11/11/15 05/11/16  WBC 5.9 6.2  HGB 11.9* 12.1*  HCT 35*  --   PLT 157 147*   Lab Results  Component Value Date   TSH 3.06 05/11/2016   Lab Results  Component Value Date   HGBA1C 6.1 (H) 04/22/2012   Lab Results  Component Value Date   CHOL 149 04/22/2012   HDL 35 (L) 04/22/2012   LDLCALC 96 04/22/2012   TRIG 89 04/22/2012   CHOLHDL 4.3 04/22/2012    Significant Diagnostic Results in last 30 days:  No  results found.  Assessment/Plan There are no diagnoses  linked to this encounter.   Family/ staff Communication:   Labs/tests ordered:

## 2016-09-12 NOTE — Assessment & Plan Note (Signed)
Stable, continue Ambien 

## 2016-09-12 NOTE — Assessment & Plan Note (Signed)
Heart rate is in control, continue Metoprolol 25mg bid.  

## 2016-09-12 NOTE — Progress Notes (Signed)
Location:  [facility] Room Number: 4 Place of Service:  SNF (31) Provider:  Burnie Hank, [place]  NP  Kimber Relic, MD  Patient Care Team: Kimber Relic, MD as PCP - General (Internal Medicine) Shelva Majestic, [facility] Je[facility] Bulger X, NP as Nurse Practitioner (Nurse Practitioner) Lyn Records, MD as Consulting Physician (Cardiology) Barron Alvine, MD as Consulting Physician (Urology) Francisca December., MD (Inactive) as Attending Physician (Cardiology) Leta Speller, MD as Consulting Physician (Dermatology) Elise Benne, MD as Consulting Physician (Ophthalmology)  Extended Emergency Contact Information Primary Emergency Contact: Mortimore,Janet Address: [address]          [place], [place] [address] [place] of [place] Home Phone: [phone] Mobile Phone: [phone] Relation: Daughter  Code Status:  DNR Goals of care: Advanced Directive information Advanced Directives 09/12/2016  Does Patient Have a Medical Advance Directive? Yes  Type of Estate agent of [place];Out of facility DNR (pink MOST or yellow form)  Does patient want to make changes to medical advance directive? No - Patient declined  Copy of Healthcare Power of Attorney in Chart? Yes  Pre-existing out of facility DNR order (yellow form or pink MOST form) Yellow form placed in chart (order not valid for inpatient use)     Chief Complaint  Patient presents with  . Medical Management of Chronic Issues    HPI:  Pt is a 83 y.o. [place] seen today for evaluation of his chronic medical conditions.   Hx of HTN, controlled on Lisinopril 2mg , Metoprolol 25mg  bid, and Furosemide 20mg , trace edema in ankles has no change, sleeps with aid of Ambien.  Hx of GERD controlled symptoms with Omeprazole. Chronic back pain is better managed with Methadone 5mg  am 7.5mg  pm, Tylenol prn, Naproxen 220mg  bid. His mood is managed on Sertraline 50mg  daily.   Past Medical History:  Diagnosis  Date  . Abnormality of gait 06/11/2012  . Alcohol abuse 04/26/2012  . Allergy, unspecified not elsewhere classified 04/28/2012  . Atrial fibrillation (HCC) 04/28/2012  . BPH (benign prostatic hyperplasia)   . CAD (coronary artery disease)    s/p CABG 2005  . Cardiac pacemaker in situ 04/28/2012  . CHF (congestive heart failure) (HCC) 04/23/2012  . Chronic airway obstruction, not elsewhere classified 04/26/2012  . Congestive heart failure, unspecified 04/28/2012  . Coronary atherosclerosis of native coronary artery 04/28/2012  . Degenerative joint disease 04/21/2012  . Depression    mild per pt  . Edema 04/28/2012  . Essential hypertension 10/17/2012  . Fall 04/21/2012   Prolonged downtime 04/20/2012   . GERD (gastroesophageal reflux disease) 09/13/2012  . Hip pain, right 09/02/2013  . History of rhabdomyolysis   . HTN (hypertension)   . Hyperlipidemia   . Hypertrophy of prostate without urinary obstruction and other lower urinary tract symptoms (LUTS) 04/28/2012  . Ingrowing nail 06/11/2012  . Insomnia 11/15/2012  . Insomnia, unspecified 04/28/2012  . Lumbago   . Macular degeneration   . Major depressive disorder, single episode, unspecified 04/28/2012  . Muscle weakness (generalized) 04/26/2012  . NSTEMI (non-ST elevated myocardial infarction) (HCC) 04/23/2012   Related to the stress of rhabdomyolysis and prolonged downtime prior to admission in a patient with known coronary artery disease   . Osteoarthritis   . Other specified disease of white blood cells 04/26/2012  . Pacemaker 04/21/2012   Near EOL.   . Paroxysmal atrial tachycardia (HCC) 04/21/2012   Brief episodes. Not felt to be an anticoagulation candidate because of age and  frailty   . Pericarditis 04/21/2012  . Personal history of fall 04/28/2012  . Reflux esophagitis 04/28/2012  . Rhabdomyolysis   . Seborrheic eczema 12/03/2012  . Sick sinus syndrome (HCC) 10/14/2003; 10/29/2012   MDT EnPulse implanted by Dr Amil AmenEdmunds for SSS; generator change  10/29/2012 by Dr Johney FrameAllred MDT Jana [employer] pacemaker  . Sporotrichosis    remote  . Synovial cyst of wrist 11/10/2014   Left wrist    . Tachy-brady syndrome (HCC) 04/21/2012   Pacemaker is near EOL   . Unspecified constipation 09/13/2012  . Unspecified glaucoma(365.9) 04/28/2012   Past Surgical History:  Procedure Laterality Date  . ANGIOPLASTY  9/11/190   Dr. Katrinka BlazingSmith  . APPENDECTOMY    . BACK SURGERY  03/11/2001  . bilateral knee arthroplasty  09/28/83; 05/17/1992   right then left  . CARPAL TUNNEL RELEASE Left   . CARPAL TUNNEL RELEASE Right 2007  . CATARACT EXTRACTION Right 10/30/1996  . CORONARY ARTERY BYPASS GRAFT  2005  . CYSTOSCOPY  08/14/85  . ELBOW SURGERY     repair  . EPIDIDYMIS SURGERY  09/17/1985  . HERNIA REPAIR Right 05/19/1976  . PACEMAKER GENERATOR CHANGE  10/29/12   Dr. Johney FrameAllred  . PACEMAKER GENERATOR CHANGE N/A 10/29/2012   Procedure: PACEMAKER GENERATOR CHANGE;  Surgeon: Hillis RangeJames Allred, MD;  [facility];  Service: Cardiovascular;  Laterality: N/A;  . PACEMAKER INSERTION  10/14/2003;10/29/2012   MDT Implanted by Dr Amil AmenEdmunds for SSS; generator change 10/29/2012 by Dr Johney FrameAllred [employer]  . REPLACEMENT TOTAL KNEE BILATERAL  1992 and 1996  . ROTATOR CUFF REPAIR Right 2008  . SHOULDER OPEN ROTATOR CUFF REPAIR Left 02/07/1996  . SIGMOIDOSCOPY  08/26/2001  . TOE SURGERY Right 2005   correction  . TRANSURETHRAL RESECTION OF PROSTATE  07/27/1983  . YAG LASER APPLICATION Right 12/26/1973   eye  . YAG LASER APPLICATION Right 04/30/2001  . YAG LASER APPLICATION Left 05/07/2001    Allergies  Allergen Reactions  . Aleve [Naproxen Sodium]   . Celebrex [Celecoxib]     Lips swelled  . Sulfur     Lips swelled  . Vioxx [Rofecoxib] Other (See Comments)    unknown    Allergies as of 09/12/2016      Reactions   Aleve [naproxen Sodium]    Celebrex [celecoxib]    Lips swelled   Sulfur    Lips swelled   Vioxx [rofecoxib] Other (See Comments)   unknown      Medication List         Accurate as of 09/12/16  3:45 PM. Always use your most recent med list.          acetaminophen 325 MG tablet Commonly known as:  TYLENOL Take 650 mg by mouth every 4 (four) hours as needed for mild pain.   amoxicillin 500 MG tablet Commonly known as:  AMOXIL Take 500 mg by mouth. Take one tablet one hour prior to surgery   aspirin 81 MG tablet Take 81 mg by mouth daily.   bimatoprost 0.01 % Soln Commonly known as:  LUMIGAN Place 1 drop into both eyes at bedtime.   carbamide peroxide 6.5 % otic solution Commonly known as:  DEBROX 5 drops into affected ear for 3 nights as needed for ear wax at bedtime   DERMAZENE 1 % Crea Generic drug:  Iodoquinol-HC Apply topically. Apply twice a day to  rash in groin area.   diclofenac sodium 1 % Gel Commonly known as:  VOLTAREN Apply four times a  day as needed for pain   docusate sodium 100 MG capsule Commonly known as:  COLACE Take 100 mg by mouth 2 (two) times daily as needed for mild constipation.   finasteride 5 MG tablet Commonly known as:  PROSCAR Take one tablet by mouth once daily   fluticasone 50 MCG/ACT nasal spray Commonly known as:  FLONASE Place 1 spray into both nostrils daily.   furosemide 20 MG tablet Commonly known as:  LASIX Take one tablet by mouth once daily. One twice weekly as needed for increased weight 2-3 lbs   guaiFENesin 600 MG 12 hr tablet Commonly known as:  MUCINEX Take 600 mg by mouth 2 (two) times daily.   ICAPS PO Take by mouth. Take one daily   KLOR-CON M10 10 MEQ tablet Generic drug:  potassium chloride Take 1 tablet by mouth twice daily  as needed with Lasix   lidocaine 4 % cream Commonly known as:  ASPERCREME W/LIDOCAINE Apply morning and evening to painful area of the lower back   lisinopril 20 MG tablet Commonly known as:  PRINIVIL,ZESTRIL Take one tablet daily for blood pressure   loratadine 10 MG tablet Commonly known as:  CLARITIN Take 10 mg by mouth daily. Reported on  07/13/2015   methadone 5 MG tablet Commonly known as:  DOLOPHINE Take one tablet by mouth every morning for pain; Take One and Half tablet (7.5mg ) by mouth every evening for pain.   metoprolol tartrate 25 MG tablet Commonly known as:  LOPRESSOR Take 25 mg by mouth 2 (two) times daily.   naproxen sodium 220 MG tablet Commonly known as:  ANAPROX Take 220 mg by mouth 2 (two) times daily.   nitroGLYCERIN 0.4 MG SL tablet Commonly known as:  NITROSTAT Place 0.4 mg under the tongue every 5 (five) minutes as needed. Reported on 07/13/2015   omeprazole 20 MG capsule Commonly known as:  PRILOSEC Take 20 mg by mouth daily.   Polyethyl Glycol-Propyl Glycol 0.4-0.3 % Soln Apply to eye. One drop both eyes twice daily   polyethylene glycol packet Commonly known as:  MIRALAX / GLYCOLAX Take 17 g by mouth daily.   senna-docusate 8.6-50 MG tablet Commonly known as:  Senokot-S Take 1 tablet by mouth. Take one tablet at bedtime   sertraline 50 MG tablet Commonly known as:  ZOLOFT Take 50 mg by mouth. Take one tablet at bedtime   sodium chloride 0.65 % Soln nasal spray Commonly known as:  OCEAN 1 spray. Spray three times a day in both nostrils   tamsulosin 0.4 MG Caps capsule Commonly known as:  FLOMAX Take 0.4 mg by mouth daily.   zolpidem 5 MG tablet Commonly known as:  AMBIEN Take one tablet by mouth at bedtime for rest       Review of Systems  Constitutional: Negative.  Negative for activity change, appetite change, fatigue and unexpected weight change.  HENT: Negative.  Negative for hearing loss, sinus pressure and sore throat.        02/19/16 nose bleed, left nostril, stopped by pressure dressing  Eyes:       Hx macular degeneration. Receiving injections every 6-8 weeks.  Respiratory: Positive for cough. Negative for apnea, chest tightness, shortness of breath, wheezing and stridor.   Cardiovascular: Positive for leg swelling. Negative for chest pain and palpitations.    Gastrointestinal: Negative for abdominal distention, abdominal pain, nausea and vomiting.       Flatulence  Endocrine: Negative.   Genitourinary: Negative for decreased urine volume,  frequency and urgency.       BPH.  Musculoskeletal: Positive for back pain and gait problem (uunstable. uses walker). Negative for neck pain and neck stiffness.       X-ray reports from 2009 and CT of the lumbar spine shw this patient has foraminal stenoses and  spinal stenosis now. There is multiple intervertebral degenerative disc disease. He has [facility], Dr. Naaman Plummer. Some pain radiates down the right leg. He is now comfortable moving around using his 4 wheel walker. He is able to get in bed and lay down comfortably overnight.  Skin:       Dry. Itching on dorsum of hands.  Allergic/Immunologic: Negative.   Hematological: Negative.   Psychiatric/Behavioral: Positive for sleep disturbance (insomnia. Ambien helps.).    Immunization History  Administered Date(s) Administered  . Influenza Whole 02/02/2013  . Influenza-Unspecified 01/29/2014, 01/28/2015, 02/03/2016  . PPD Test 05/09/2012  . Pneumococcal Polysaccharide-23 04/24/2009  . Tdap 04/21/2012  . Zoster 04/25/2007   Pertinent  Health Maintenance Due  Topic Date Due  . PNA vac Low Risk Adult (2 of 2 - PCV13) 04/24/2010  . INFLUENZA VACCINE  11/22/2016   Fall Risk  12/14/2015 07/13/2015 12/15/2014 08/04/2014  Falls in the past year? Yes No No No  Number falls in past yr: 1 - - -  Injury with Fall? No - - -  Risk for fall due to : - - - History of fall(s);Impaired balance/gait   Functional Status Survey:    Vitals:   09/12/16 1527  BP: (!) 114/59  Pulse: 64  Resp: 18  Temp: Caleb.4 F (36.9 C)  SpO2: Caleb%  Weight: 180 lb (81.6 kg)  Height: 5\' 8"  (1.727 m)   Body mass index is 27.37 kg/m. Physical Exam  Constitutional: He is oriented to person, place, and time. He appears well-nourished. No  distress.  Frail  HENT:  Head: Normocephalic and atraumatic.  Right Ear: External ear normal.  Left Ear: External ear normal.  Nose: Nose normal.  Mouth/Throat: Oropharynx is clear and moist.  Mild decrease in hearing  Eyes: Conjunctivae and EOM are normal. Pupils are equal, round, and reactive to light.  Neck: No JVD present. No tracheal deviation present. No thyromegaly present.  Cardiovascular: Normal rate, regular rhythm and intact distal pulses.  Exam reveals no gallop and no friction rub.   No murmur heard. Pulmonary/Chest: Effort normal. No respiratory distress. He has no wheezes. He has rales (Right chest).  Abdominal: He exhibits no distension and no mass. There is no tenderness.  Large upper diastasis recti  Musculoskeletal: Normal range of motion. He exhibits edema (1+ bipedal) and tenderness.  Using walker. Bilateral knee prosthesis. Tender lumbar spine and shading towards the left side of the small of the back. BLE trace edema  Lymphadenopathy:    He has no cervical adenopathy.  Neurological: He is alert and oriented to person, place, and time. No cranial nerve deficit. Coordination normal.  Diminished sensation to vibration in the right foot.  Skin: Skin is dry. No rash noted. No erythema. No pallor.  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 Multiple SK.  Psychiatric: He has a normal mood and affect. His behavior is normal. Judgment and thought content normal.    Labs reviewed:  Recent Labs  05/11/16 05/22/16 06/22/16  NA 136* 137 137  K 5.2 4.6 4.7  BUN 42* 40* 37*  CREATININE 1.5* 1.5* 1.3  Recent Labs  11/11/15 05/04/16 05/11/16  AST 13*  --  18  ALT 10 10  --   ALKPHOS 57  --  62    Recent Labs  11/11/15 05/11/16  WBC 5.9 6.2  HGB 11.9* 12.1*  HCT 35*  --   PLT 157 147*   Lab Results  Component Value Date   TSH 3.06 05/11/2016   Lab Results  Component Value Date   HGBA1C 6.1 (H) 04/22/2012    Lab Results  Component Value Date   CHOL 149 04/22/2012   HDL 35 (L) 04/22/2012   LDLCALC 96 04/22/2012   TRIG 89 04/22/2012   CHOLHDL 4.3 04/22/2012    Significant Diagnostic Results in last 30 days:  No results found.  Assessment/Plan Paroxysmal atrial tachycardia (HCC) Heart rate is in control, continue Metoprolol 25mg  bid.      CHF (congestive heart failure) (HCC) Compensated clinically, continue Furosemide 20mg  daily, 05/22/16 Na 137, K 4.6, Bun 40, creat 1.52, BNP 206.4, update CBC CMP TSH  Essential hypertension Controlled, continue Lisinopril 20mg , Metoprolol 25mg  bid, Furosemide 20mg  daily.   Constipation Stable, continue MiraLax and Colace  Degenerative joint disease Continue Methadone, Tylenol, Naproxen for pain  Hypertensive kidney disease with CKD (chronic kidney disease) stage V (HCC) 06/22/16 Na 137, K 4.7, Bun 37, creat 1.32, BNP 264  BPH (benign prostatic hyperplasia) Continue Tamsulosin 0.4mg  and Finasteride 5mg  daily. Stable.   Depression Mood is stable, continue Sertraline 50mg  daily.   Edema trace in BLE, continue Furosemide 20mg  daily.  Insomnia Stable, continue Ambien     Family/ staff Communication: AL  Labs/tests ordered: CBC CMP TSH

## 2016-09-12 NOTE — Assessment & Plan Note (Signed)
Mood is stable, continue Sertraline 50mg daily.  

## 2016-09-12 NOTE — Assessment & Plan Note (Signed)
06/22/16 Na 137, K 4.7, Bun 37, creat 1.32, BNP 264

## 2016-09-12 NOTE — Assessment & Plan Note (Signed)
Continue Methadone, Tylenol, Naproxen for pain

## 2016-09-14 DIAGNOSIS — D649 Anemia, unspecified: Secondary | ICD-10-CM | POA: Diagnosis not present

## 2016-09-14 DIAGNOSIS — E039 Hypothyroidism, unspecified: Secondary | ICD-10-CM | POA: Diagnosis not present

## 2016-09-14 DIAGNOSIS — I10 Essential (primary) hypertension: Secondary | ICD-10-CM | POA: Diagnosis not present

## 2016-09-14 LAB — BASIC METABOLIC PANEL
BUN: 37 mg/dL — AB (ref 4–21)
CREATININE: 1.5 mg/dL — AB (ref 0.6–1.3)
Glucose: 150 mg/dL
POTASSIUM: 4.7 mmol/L (ref 3.4–5.3)
Sodium: 136 mmol/L — AB (ref 137–147)

## 2016-09-14 LAB — HEPATIC FUNCTION PANEL
ALK PHOS: 69 U/L (ref 25–125)
ALT: 13 U/L (ref 10–40)
AST: 15 U/L (ref 14–40)
BILIRUBIN, TOTAL: 0.5 mg/dL

## 2016-09-14 LAB — TSH: TSH: 3.55 u[IU]/mL (ref 0.41–5.90)

## 2016-09-14 LAB — CBC AND DIFFERENTIAL
HCT: 35 % — AB (ref 41–53)
Hemoglobin: 11.7 g/dL — AB (ref 13.5–17.5)
Platelets: 138 10*3/uL — AB (ref 150–399)
WBC: 5.4 10*3/mL

## 2016-10-10 ENCOUNTER — Ambulatory Visit (INDEPENDENT_AMBULATORY_CARE_PROVIDER_SITE_OTHER): Payer: Medicare Other | Admitting: *Deleted

## 2016-10-10 DIAGNOSIS — I495 Sick sinus syndrome: Secondary | ICD-10-CM

## 2016-10-12 LAB — CUP PACEART REMOTE DEVICE CHECK
Battery Impedance: 432 Ohm
Battery Voltage: 2.78 V
Brady Statistic AP VS Percent: 0 %
Brady Statistic AS VS Percent: 0 %
Date Time Interrogation Session: 20180619161406
Implantable Lead Implant Date: 20050622
Implantable Lead Location: 753859
Implantable Lead Location: 753860
Implantable Lead Model: 5076
Implantable Pulse Generator Implant Date: 20140708
Lead Channel Impedance Value: 649 Ohm
Lead Channel Pacing Threshold Pulse Width: 0.4 ms
Lead Channel Setting Pacing Amplitude: 2.5 V
Lead Channel Setting Pacing Pulse Width: 0.4 ms
Lead Channel Setting Sensing Sensitivity: 4 mV
MDC IDC LEAD IMPLANT DT: 20050622
MDC IDC MSMT BATTERY REMAINING LONGEVITY: 76 mo
MDC IDC MSMT LEADCHNL RA IMPEDANCE VALUE: 447 Ohm
MDC IDC MSMT LEADCHNL RA PACING THRESHOLD AMPLITUDE: 0.5 V
MDC IDC MSMT LEADCHNL RA PACING THRESHOLD PULSEWIDTH: 0.4 ms
MDC IDC MSMT LEADCHNL RV PACING THRESHOLD AMPLITUDE: 0.5 V
MDC IDC SET LEADCHNL RA PACING AMPLITUDE: 2 V
MDC IDC STAT BRADY AP VP PERCENT: 99 %
MDC IDC STAT BRADY AS VP PERCENT: 1 %

## 2016-10-12 NOTE — Progress Notes (Signed)
Remote pacemaker transmission.   

## 2016-10-13 ENCOUNTER — Encounter: Payer: Self-pay | Admitting: Cardiology

## 2016-11-22 ENCOUNTER — Telehealth: Payer: Self-pay | Admitting: *Deleted

## 2016-11-22 NOTE — Telephone Encounter (Signed)
Med list update only

## 2016-12-01 DIAGNOSIS — M79672 Pain in left foot: Secondary | ICD-10-CM | POA: Diagnosis not present

## 2016-12-01 DIAGNOSIS — B351 Tinea unguium: Secondary | ICD-10-CM | POA: Diagnosis not present

## 2016-12-01 DIAGNOSIS — M79671 Pain in right foot: Secondary | ICD-10-CM | POA: Diagnosis not present

## 2016-12-11 ENCOUNTER — Non-Acute Institutional Stay: Payer: Medicare Other

## 2016-12-11 DIAGNOSIS — Z0000 Encounter for general adult medical examination without abnormal findings: Secondary | ICD-10-CM | POA: Diagnosis not present

## 2016-12-11 NOTE — Progress Notes (Signed)
Subjective:   Caleb Nash is a 83 y.o. male who presents for Medicare Annual/Subsequent preventive examination at [facility]    Objective:    Vitals: BP (!) 100/54 (BP Location: Right Arm, Patient Position: Sitting)   Pulse (!) 59   Temp 97.8 F (36.6 C) (Oral)   Ht 5\' 8"  (1.727 m)   Wt 180 lb (81.6 kg)   SpO2 98%   BMI 27.37 kg/m   Body mass index is 27.37 kg/m.  Tobacco History  Smoking Status  . Never Smoker  Smokeless Tobacco  . Never Used     Counseling given: Not Answered   Past Medical History:  Diagnosis Date  . Abnormality of gait 06/11/2012  . Alcohol abuse 04/26/2012  . Allergy, unspecified not elsewhere classified 04/28/2012  . Atrial fibrillation (HCC) 04/28/2012  . BPH (benign prostatic hyperplasia)   . CAD (coronary artery disease)    s/p CABG 2005  . Cardiac pacemaker in situ 04/28/2012  . CHF (congestive heart failure) (HCC) 04/23/2012  . Chronic airway obstruction, not elsewhere classified 04/26/2012  . Congestive heart failure, unspecified 04/28/2012  . Coronary atherosclerosis of native coronary artery 04/28/2012  . Degenerative joint disease 04/21/2012  . Depression    mild per pt  . Edema 04/28/2012  . Essential hypertension 10/17/2012  . Fall 04/21/2012   Prolonged downtime 04/20/2012   . GERD (gastroesophageal reflux disease) 09/13/2012  . Hip pain, right 09/02/2013  . History of rhabdomyolysis   . HTN (hypertension)   . Hyperlipidemia   . Hypertrophy of prostate without urinary obstruction and other lower urinary tract symptoms (LUTS) 04/28/2012  . Ingrowing nail 06/11/2012  . Insomnia 11/15/2012  . Insomnia, unspecified 04/28/2012  . Lumbago   . Macular degeneration   . Major depressive disorder, single episode, unspecified 04/28/2012  . Muscle weakness (generalized) 04/26/2012  . NSTEMI (non-ST elevated myocardial infarction) (HCC) 04/23/2012   Related to the stress of rhabdomyolysis and prolonged downtime prior to admission in  a patient with known coronary artery disease   . Osteoarthritis   . Other specified disease of white blood cells 04/26/2012  . Pacemaker 04/21/2012   Near EOL.   . Paroxysmal atrial tachycardia (HCC) 04/21/2012   Brief episodes. Not felt to be an anticoagulation candidate because of age and frailty   . Pericarditis 04/21/2012  . Personal history of fall 04/28/2012  . Reflux esophagitis 04/28/2012  . Rhabdomyolysis   . Seborrheic eczema 12/03/2012  . Sick sinus syndrome (HCC) 10/14/2003; 10/29/2012   MDT EnPulse implanted by Dr Amil Amen for SSS; generator change 10/29/2012 by Dr Johney Frame MDT Jana Half pacemaker  . Sporotrichosis    remote  . Synovial cyst of wrist 11/10/2014   Left wrist    . Tachy-brady syndrome (HCC) 04/21/2012   Pacemaker is near EOL   . Unspecified constipation 09/13/2012  . Unspecified glaucoma(365.9) 04/28/2012   Past Surgical History:  Procedure Laterality Date  . ANGIOPLASTY  9/11/190   Dr. Katrinka Blazing  . APPENDECTOMY    . BACK SURGERY  03/11/2001  . bilateral knee arthroplasty  09/28/83; 05/17/1992   right then left  . CARPAL TUNNEL RELEASE Left   . CARPAL TUNNEL RELEASE Right 2007  . CATARACT EXTRACTION Right 10/30/1996  . CORONARY ARTERY BYPASS GRAFT  2005  . CYSTOSCOPY  08/14/85  . ELBOW SURGERY     repair  . EPIDIDYMIS SURGERY  09/17/1985  . HERNIA REPAIR Right 05/19/1976  . PACEMAKER GENERATOR CHANGE  10/29/12  Dr. Johney Frame  . PACEMAKER GENERATOR CHANGE N/A 10/29/2012   Procedure: PACEMAKER GENERATOR CHANGE;  Surgeon: Hillis Range, MD;  Location: [facility] CATH LAB;  Service: Cardiovascular;  Laterality: N/A;  . PACEMAKER INSERTION  10/14/2003;10/29/2012   MDT Implanted by Dr Amil Amen for SSS; generator change 10/29/2012 by Dr Johney Frame Medtronic Tonica  . REPLACEMENT TOTAL KNEE BILATERAL  1992 and 1996  . ROTATOR CUFF REPAIR Right 2008  . SHOULDER OPEN ROTATOR CUFF REPAIR Left 02/07/1996  . SIGMOIDOSCOPY  08/26/2001  . TOE SURGERY Right 2005   correction  . TRANSURETHRAL RESECTION OF PROSTATE   07/27/1983  . YAG LASER APPLICATION Right 12/26/1973   eye  . YAG LASER APPLICATION Right 04/30/2001  . YAG LASER APPLICATION Left 05/07/2001   Family History  Problem Relation Age of Onset  . CVA Mother   . CVA Father   . Cancer Unknown    History  Sexual Activity  . Sexual activity: No    Outpatient Encounter Prescriptions as of 12/11/2016  Medication Sig  . acetaminophen (TYLENOL) 325 MG tablet Take 650 mg by mouth every 4 (four) hours as needed for mild pain.  Marland Kitchen amoxicillin (AMOXIL) 500 MG tablet Take 500 mg by mouth. Take one tablet one hour prior to surgery  . aspirin 81 MG tablet Take 81 mg by mouth daily.  . bimatoprost (LUMIGAN) 0.01 % SOLN Place 1 drop into both eyes at bedtime.   . carbamide peroxide (DEBROX) 6.5 % otic solution 5 drops into affected ear for 3 nights as needed for ear wax at bedtime  . diclofenac sodium (VOLTAREN) 1 % GEL Apply four times a day as needed for pain  . docusate sodium (COLACE) 100 MG capsule Take 100 mg by mouth 2 (two) times daily as needed for mild constipation.  . finasteride (PROSCAR) 5 MG tablet Take one tablet by mouth once daily  . fluticasone (FLONASE) 50 MCG/ACT nasal spray Place 1 spray into both nostrils daily.   . furosemide (LASIX) 20 MG tablet Take one tablet by mouth once daily. One twice weekly as needed for increased weight 2-3 lbs  . guaiFENesin (MUCINEX) 600 MG 12 hr tablet Take 600 mg by mouth 2 (two) times daily.   Marland Kitchen guaiFENesin-codeine (ROBAFEN AC) 100-10 MG/5ML syrup Take 10 mLs by mouth 4 (four) times daily as needed for cough.  . Iodoquinol-HC (DERMAZENE) 1 % CREA Apply topically. Apply twice a day to  rash in groin area.  Marland Kitchen KLOR-CON M10 10 MEQ tablet Take 1 tablet by mouth twice daily  as needed with Lasix  . lidocaine (ASPERCREME W/LIDOCAINE) 4 % cream Apply morning and evening to painful area of the lower back  . lisinopril (PRINIVIL,ZESTRIL) 20 MG tablet Take one tablet daily for blood pressure  . loratadine (CLARITIN)  10 MG tablet Take 10 mg by mouth daily. Reported on 07/13/2015  . methadone (DOLOPHINE) 5 MG tablet Take one tablet by mouth every morning for pain; Take One and Half tablet (7.5mg ) by mouth every evening for pain.  . metoprolol tartrate (LOPRESSOR) 25 MG tablet Take 25 mg by mouth 2 (two) times daily.  . Multiple Vitamins-Minerals (ICAPS PO) Take by mouth. Take one daily  . naproxen sodium (ANAPROX) 220 MG tablet Take 220 mg by mouth 2 (two) times daily.  . nitroGLYCERIN (NITROSTAT) 0.4 MG SL tablet Place 0.4 mg under the tongue every 5 (five) minutes as needed. Reported on 07/13/2015  . nystatin (NYSTATIN) powder Apply topically 3 (three) times daily.  Marland Kitchen omeprazole (  PRILOSEC) 20 MG capsule Take 20 mg by mouth daily.  Bertram Gala Glycol-Propyl Glycol 0.4-0.3 % SOLN Apply to eye. One drop both eyes twice daily  . polyethylene glycol (MIRALAX / GLYCOLAX) packet Take 17 g by mouth daily.  Marland Kitchen senna-docusate (SENOKOT-S) 8.6-50 MG tablet Take 1 tablet by mouth. Take one tablet at bedtime  . sertraline (ZOLOFT) 50 MG tablet Take 50 mg by mouth. Take one tablet at bedtime  . Silver (ALLEVYN AG GENTLE) 4"X4" PADS Apply topically daily as needed (Replace as needed). Silver-sulfadiaz-foam bandage)  . sodium chloride (OCEAN) 0.65 % SOLN nasal spray 1 spray. Spray three times a day in both nostrils  . Tamsulosin HCl (FLOMAX) 0.4 MG CAPS Take 0.4 mg by mouth daily.  Marland Kitchen zolpidem (AMBIEN) 5 MG tablet Take one tablet by mouth at bedtime for rest   No facility-administered encounter medications on file as of 12/11/2016.     Activities of Daily Living In your present state of health, do you have any difficulty performing the following activities: 12/11/2016  Hearing? N  Vision? Y  Difficulty concentrating or making decisions? Y  Walking or climbing stairs? Y  Dressing or bathing? Y  Doing errands, shopping? Y  Preparing Food and eating ? Y  Using the Toilet? N  In the past six months, have you accidently  leaked urine? N  Do you have problems with loss of bowel control? N  Managing your Medications? Y  Managing your Finances? Y  Housekeeping or managing your Housekeeping? Y  Some recent data might be hidden    Patient Care Team: Oneal Grout, MD as PCP - General (Internal Medicine) Shelva Majestic, [facility], Man X, NP as Nurse Practitioner (Nurse Practitioner) Lyn Records, MD as Consulting Physician (Cardiology) Barron Alvine, MD as Consulting Physician (Urology) Francisca December., MD as Attending Physician (Cardiology) Leta Speller, MD as Consulting Physician (Dermatology) Elise Benne, MD as Consulting Physician (Ophthalmology)   Assessment:     Exercise Activities and Dietary recommendations Current Exercise Habits: The patient does not participate in regular exercise at present, Exercise limited by: orthopedic condition(s)  Goals    None     Fall Risk Fall Risk  12/11/2016 12/14/2015 07/13/2015 12/15/2014 08/04/2014  Falls in the past year? No Yes No No No  Comment - 12/11/15 - - -  Number falls in past yr: - 1 - - -  Injury with Fall? - No - - -  Risk for fall due to : - - - - History of fall(s);Impaired balance/gait   Depression Screen PHQ 2/9 Scores 12/11/2016 12/14/2015 08/04/2014  PHQ - 2 Score 0 1 0    Cognitive Function MMSE - Mini Mental State Exam 12/11/2016  Orientation to time 5  Orientation to Place 5  Registration 3  Attention/ Calculation 4  Recall 2  Language- name 2 objects 2  Language- repeat 1  Language- follow 3 step command 3  Language- read & follow direction 1  Write a sentence 1  Copy design 0  Total score 27        Immunization History  Administered Date(s) Administered  . Influenza Whole 02/02/2013  . Influenza-Unspecified 01/29/2014, 01/28/2015, 02/03/2016  . PPD Test 05/09/2012  . Pneumococcal Polysaccharide-23 04/24/2009  . Tdap 04/21/2012  . Zoster 04/25/2007   Screening Tests Health Maintenance  Topic Date Due  .  INFLUENZA VACCINE  11/22/2016  . TETANUS/TDAP  04/21/2022  . PNA vac Low Risk Adult  Completed      Plan:  I have personally reviewed and addressed the Medicare Annual Wellness questionnaire and have noted the following in the patient's chart:  A. Medical and social history B. Use of alcohol, tobacco or illicit drugs  C. Current medications and supplements D. Functional ability and status E.  Nutritional status F.  Physical activity G. Advance directives H. List of other physicians I.  Hospitalizations, surgeries, and ER visits in previous 12 months J.  Vitals K. Screenings to include hearing, vision, cognitive, depression L. Referrals and appointments - none  In addition, I have reviewed and discussed with patient certain preventive protocols, quality metrics, and best practice recommendations. A written personalized care plan for preventive services as well as general preventive health recommendations were provided to patient.  See attached scanned questionnaire for additional information.   Signed,   Annetta Maw, RN Nurse Health Advisor   Quick Notes   Health Maintenance: Up to date     Abnormal Screen: MMSE 27/30. Did not pass clock     Patient Concerns: None     Nurse Concerns: None

## 2016-12-11 NOTE — Patient Instructions (Signed)
Caleb Nash , Thank you for taking time to come for your Medicare Wellness Visit. I appreciate your ongoing commitment to your health goals. Please review the following plan we discussed and let me know if I can assist you in the future.   Screening recommendations/referrals: Colonoscopy excluded, pt over age 81 Recommended yearly ophthalmology/optometry visit for glaucoma screening and checkup Recommended yearly dental visit for hygiene and checkup  Vaccinations: Influenza vaccine due 2018 fall season Pneumococcal vaccine up to dat Tdap vaccine up to dat.e due 04/21/22 Shingles vaccine not in records  Advanced directives: In Chart  Conditions/risks identified: None  Next appointment: Dr. Glade Lloyd makes rounds  Preventive Care 55 Years and Older, Male Preventive care refers to lifestyle choices and visits with your health care provider that can promote health and wellness. What does preventive care include?  A yearly physical exam. This is also called an annual well check.  Dental exams once or twice a year.  Routine eye exams. Ask your health care provider how often you should have your eyes checked.  Personal lifestyle choices, including:  Daily care of your teeth and gums.  Regular physical activity.  Eating a healthy diet.  Avoiding tobacco and drug use.  Limiting alcohol use.  Practicing safe sex.  Taking low doses of aspirin every day.  Taking vitamin and mineral supplements as recommended by your health care provider. What happens during an annual well check? The services and screenings done by your health care provider during your annual well check will depend on your age, overall health, lifestyle risk factors, and family history of disease. Counseling  Your health care provider may ask you questions about your:  Alcohol use.  Tobacco use.  Drug use.  Emotional well-being.  Home and relationship well-being.  Sexual activity.  Eating  habits.  History of falls.  Memory and ability to understand (cognition).  Work and work Astronomer. Screening  You may have the following tests or measurements:  Height, weight, and BMI.  Blood pressure.  Lipid and cholesterol levels. These may be checked every 5 years, or more frequently if you are over 69 years old.  Skin check.  Lung cancer screening. You may have this screening every year starting at age 68 if you have a 30-pack-year history of smoking and currently smoke or have quit within the past 15 years.  Fecal occult blood test (FOBT) of the stool. You may have this test every year starting at age 43.  Flexible sigmoidoscopy or colonoscopy. You may have a sigmoidoscopy every 5 years or a colonoscopy every 10 years starting at age 1.  Prostate cancer screening. Recommendations will vary depending on your family history and other risks.  Hepatitis C blood test.  Hepatitis B blood test.  Sexually transmitted disease (STD) testing.  Diabetes screening. This is done by checking your blood sugar (glucose) after you have not eaten for a while (fasting). You may have this done every 1-3 years.  Abdominal aortic aneurysm (AAA) screening. You may need this if you are a current or former smoker.  Osteoporosis. You may be screened starting at age 50 if you are at high risk. Talk with your health care provider about your test results, treatment options, and if necessary, the need for more tests. Vaccines  Your health care provider may recommend certain vaccines, such as:  Influenza vaccine. This is recommended every year.  Tetanus, diphtheria, and acellular pertussis (Tdap, Td) vaccine. You may need a Td booster every 10 years.  Zoster vaccine. You may need this after age 61.  Pneumococcal 13-valent conjugate (PCV13) vaccine. One dose is recommended after age 41.  Pneumococcal polysaccharide (PPSV23) vaccine. One dose is recommended after age 64. Talk to your health  care provider about which screenings and vaccines you need and how often you need them. This information is not intended to replace advice given to you by your health care provider. Make sure you discuss any questions you have with your health care provider. Document Released: 05/07/2015 Document Revised: 12/29/2015 Document Reviewed: 02/09/2015 Elsevier Interactive Patient Education  2017 [place] Prevention in the Home Falls can cause injuries. They can happen to people of all ages. There are many things you can do to make your home safe and to help prevent falls. What can I do on the outside of my home?  Regularly fix the edges of walkways and driveways and fix any cracks.  Remove anything that might make you trip as you walk through a door, such as a raised step or threshold.  Trim any bushes or trees on the path to your home.  Use bright outdoor lighting.  Clear any walking paths of anything that might make someone trip, such as rocks or tools.  Regularly check to see if handrails are loose or broken. Make sure that both sides of any steps have handrails.  Any raised decks and porches should have guardrails on the edges.  Have any leaves, snow, or ice cleared regularly.  Use sand or salt on walking paths during winter.  Clean up any spills in your garage right away. This includes oil or grease spills. What can I do in the bathroom?  Use night lights.  Install grab bars by the toilet and in the tub and shower. Do not use towel bars as grab bars.  Use non-skid mats or decals in the tub or shower.  If you need to sit down in the shower, use a plastic, non-slip stool.  Keep the floor dry. Clean up any water that spills on the floor as soon as it happens.  Remove soap buildup in the tub or shower regularly.  Attach bath mats securely with double-sided non-slip rug tape.  Do not have throw rugs and other things on the floor that can make you trip. What can I do  in the bedroom?  Use night lights.  Make sure that you have a light by your bed that is easy to reach.  Do not use any sheets or blankets that are too big for your bed. They should not hang down onto the floor.  Have a firm chair that has side arms. You can use this for support while you get dressed.  Do not have throw rugs and other things on the floor that can make you trip. What can I do in the kitchen?  Clean up any spills right away.  Avoid walking on wet floors.  Keep items that you use a lot in easy-to-reach places.  If you need to reach something above you, use a strong step stool that has a grab bar.  Keep electrical cords out of the way.  Do not use floor polish or wax that makes floors slippery. If you must use wax, use non-skid floor wax.  Do not have throw rugs and other things on the floor that can make you trip. What can I do with my stairs?  Do not leave any items on the stairs.  Make sure that there are  handrails on both sides of the stairs and use them. Fix handrails that are broken or loose. Make sure that handrails are as long as the stairways.  Check any carpeting to make sure that it is firmly attached to the stairs. Fix any carpet that is loose or worn.  Avoid having throw rugs at the top or bottom of the stairs. If you do have throw rugs, attach them to the floor with carpet tape.  Make sure that you have a light switch at the top of the stairs and the bottom of the stairs. If you do not have them, ask someone to add them for you. What else can I do to help prevent falls?  Wear shoes that:  Do not have high heels.  Have rubber bottoms.  Are comfortable and fit you well.  Are closed at the toe. Do not wear sandals.  If you use a stepladder:  Make sure that it is fully opened. Do not climb a closed stepladder.  Make sure that both sides of the stepladder are locked into place.  Ask someone to hold it for you, if possible.  Clearly mark  and make sure that you can see:  Any grab bars or handrails.  First and last steps.  Where the edge of each step is.  Use tools that help you move around (mobility aids) if they are needed. These include:  Canes.  Walkers.  Scooters.  Crutches.  Turn on the lights when you go into a dark area. Replace any light bulbs as soon as they burn out.  Set up your furniture so you have a clear path. Avoid moving your furniture around.  If any of your floors are uneven, fix them.  If there are any pets around you, be aware of where they are.  Review your medicines with your doctor. Some medicines can make you feel dizzy. This can increase your chance of falling. Ask your doctor what other things that you can do to help prevent falls. This information is not intended to replace advice given to you by your health care provider. Make sure you discuss any questions you have with your health care provider. Document Released: 02/04/2009 Document Revised: 09/16/2015 Document Reviewed: 05/15/2014 Elsevier Interactive Patient Education  2017 [employer].

## 2016-12-12 ENCOUNTER — Encounter: Payer: Self-pay | Admitting: Family

## 2016-12-12 ENCOUNTER — Non-Acute Institutional Stay: Payer: Medicare Other | Admitting: Family

## 2016-12-12 DIAGNOSIS — N401 Enlarged prostate with lower urinary tract symptoms: Secondary | ICD-10-CM | POA: Diagnosis not present

## 2016-12-12 DIAGNOSIS — I132 Hypertensive heart and chronic kidney disease with heart failure and with stage 5 chronic kidney disease, or end stage renal disease: Secondary | ICD-10-CM | POA: Diagnosis not present

## 2016-12-12 DIAGNOSIS — I509 Heart failure, unspecified: Secondary | ICD-10-CM | POA: Diagnosis not present

## 2016-12-12 DIAGNOSIS — B379 Candidiasis, unspecified: Secondary | ICD-10-CM

## 2016-12-12 DIAGNOSIS — M5440 Lumbago with sciatica, unspecified side: Secondary | ICD-10-CM

## 2016-12-12 DIAGNOSIS — F32A Depression, unspecified: Secondary | ICD-10-CM

## 2016-12-12 DIAGNOSIS — R350 Frequency of micturition: Secondary | ICD-10-CM

## 2016-12-12 DIAGNOSIS — F329 Major depressive disorder, single episode, unspecified: Secondary | ICD-10-CM | POA: Diagnosis not present

## 2016-12-12 DIAGNOSIS — G8929 Other chronic pain: Secondary | ICD-10-CM | POA: Diagnosis not present

## 2016-12-12 NOTE — Progress Notes (Signed)
Location:  [facility] Room Number: 4 Place of Service:  ALF [phone]) Provider: Dinah Ngetich FNP-C   Oneal Grout, MD  Patient Care Team: Oneal Grout, MD as PCP - General (Internal Medicine) Shelva Majestic, [facility], Man X, NP as Nurse Practitioner (Nurse Practitioner) Lyn Records, MD as Consulting Physician (Cardiology) Barron Alvine, MD as Consulting Physician (Urology) Francisca December., MD as Attending Physician (Cardiology) Leta Speller, MD as Consulting Physician (Dermatology) Elise Benne, MD as Consulting Physician (Ophthalmology)  Extended Emergency Contact Information Primary Emergency Contact: Mortimore,Janet Address: [address]          [place], [place] [address] [place] Home Phone: [phone] Mobile Phone: [phone] Relation: Daughter  Code Status:  DNR Goals of care: Advanced Directive information Advanced Directives 12/11/2016  Does Patient Have a Medical Advance Directive? Yes  Type of Estate agent of [place];Living will;Out of facility DNR (pink MOST or yellow form)  Does patient want to make changes to medical advance directive? No - Patient declined  Copy of Healthcare Power of Attorney in Chart? Yes  Pre-existing out of facility DNR order (yellow form or pink MOST form) Pink MOST form placed in chart (order not valid for inpatient use);Yellow form placed in chart (order not valid for inpatient use)     Chief Complaint  Patient presents with  . Medical Management of Chronic Issues    routine visit    HPI:  Pt is a 83 y.o. male seen today [facility] for medical management of chronic diseases. He has a medical history of HTN, CAD, Afib, CHF, sick sinus syndrome,GERD,BPH, CKD stage V, depression among other conditions. He is seen in his room today with Nurse supervisor present at bedside. He request for presence of staff during his morning showers three times a week for  fall safety precautions. Nurse supervisor will arrange for supervision during showers. He also states has had increased urine leaking prior to reaching the bathroom due to BPH.Encouraged to use pull ups and change frequently with incontinence. He denies any fever, chills or urinary tract infection symptoms. Facility Nurse reports skin redness on patient's buttock. He has had no recent fall episodes. He has had progressive weight loss though states eating well. Weight 178.2 lbs (11/2016); 179.6 lbs (10/2016); 180.2 lbs ( 09/2016); 181.8 lbs ( 08/2016).       Past Medical History:  Diagnosis Date  . Abnormality of gait 06/11/2012  . Alcohol abuse 04/26/2012  . Allergy, unspecified not elsewhere classified 04/28/2012  . Atrial fibrillation (HCC) 04/28/2012  . BPH (benign prostatic hyperplasia)   . CAD (coronary artery disease)    s/p CABG 2005  . Cardiac pacemaker in situ 04/28/2012  . CHF (congestive heart failure) (HCC) 04/23/2012  . Chronic airway obstruction, not elsewhere classified 04/26/2012  . Congestive heart failure, unspecified 04/28/2012  . Coronary atherosclerosis of native coronary artery 04/28/2012  . Degenerative joint disease 04/21/2012  . Depression    mild per pt  . Edema 04/28/2012  . Essential hypertension 10/17/2012  . Fall 04/21/2012   Prolonged downtime 04/20/2012   . GERD (gastroesophageal reflux disease) 09/13/2012  . Hip pain, right 09/02/2013  . History of rhabdomyolysis   . HTN (hypertension)   . Hyperlipidemia   . Hypertrophy of prostate without urinary obstruction and other lower urinary tract symptoms (LUTS) 04/28/2012  . Ingrowing nail 06/11/2012  . Insomnia 11/15/2012  . Insomnia, unspecified 04/28/2012  . Lumbago   . Macular degeneration   .  Major depressive disorder, single episode, unspecified 04/28/2012  . Muscle weakness (generalized) 04/26/2012  . NSTEMI (non-ST elevated myocardial infarction) (HCC) 04/23/2012   Related to the stress of rhabdomyolysis and prolonged downtime  prior to admission in a patient with known coronary artery disease   . Osteoarthritis   . Other specified disease of white blood cells 04/26/2012  . Pacemaker 04/21/2012   Near EOL.   . Paroxysmal atrial tachycardia (HCC) 04/21/2012   Brief episodes. Not felt to be an anticoagulation candidate because of age and frailty   . Pericarditis 04/21/2012  . Personal history of fall 04/28/2012  . Reflux esophagitis 04/28/2012  . Rhabdomyolysis   . Seborrheic eczema 12/03/2012  . Sick sinus syndrome (HCC) 10/14/2003; 10/29/2012   MDT EnPulse implanted by Dr Amil Amen for SSS; generator change 10/29/2012 by Dr Johney Frame MDT Jana Half pacemaker  . Sporotrichosis    remote  . Synovial cyst of wrist 11/10/2014   Left wrist    . Tachy-brady syndrome (HCC) 04/21/2012   Pacemaker is near EOL   . Unspecified constipation 09/13/2012  . Unspecified glaucoma(365.9) 04/28/2012   Past Surgical History:  Procedure Laterality Date  . ANGIOPLASTY  9/11/190   Dr. Katrinka Blazing  . APPENDECTOMY    . BACK SURGERY  03/11/2001  . bilateral knee arthroplasty  09/28/83; 05/17/1992   right then left  . CARPAL TUNNEL RELEASE Left   . CARPAL TUNNEL RELEASE Right 2007  . CATARACT EXTRACTION Right 10/30/1996  . CORONARY ARTERY BYPASS GRAFT  2005  . CYSTOSCOPY  08/14/85  . ELBOW SURGERY     repair  . EPIDIDYMIS SURGERY  09/17/1985  . HERNIA REPAIR Right 05/19/1976  . PACEMAKER GENERATOR CHANGE  10/29/12   Dr. Johney Frame  . PACEMAKER GENERATOR CHANGE N/A 10/29/2012   Procedure: PACEMAKER GENERATOR CHANGE;  Surgeon: Hillis Range, MD;  Location: [facility] CATH LAB;  Service: Cardiovascular;  Laterality: N/A;  . PACEMAKER INSERTION  10/14/2003;10/29/2012   MDT Implanted by Dr Amil Amen for SSS; generator change 10/29/2012 by Dr Johney Frame [employer] [place]  . REPLACEMENT TOTAL KNEE BILATERAL  1992 and 1996  . ROTATOR CUFF REPAIR Right 2008  . SHOULDER OPEN ROTATOR CUFF REPAIR Left 02/07/1996  . SIGMOIDOSCOPY  08/26/2001  . TOE SURGERY Right 2005   correction  . TRANSURETHRAL  RESECTION OF PROSTATE  07/27/1983  . YAG LASER APPLICATION Right 12/26/1973   eye  . YAG LASER APPLICATION Right 04/30/2001  . YAG LASER APPLICATION Left 05/07/2001    Allergies  Allergen Reactions  . Aleve [Naproxen Sodium]   . Celebrex [Celecoxib]     Lips swelled  . Sulfur     Lips swelled  . Vioxx [Rofecoxib] Other (See Comments)    unknown    Allergies as of 12/12/2016      Reactions   Aleve [naproxen Sodium]    Celebrex [celecoxib]    Lips swelled   Sulfur    Lips swelled   Vioxx [rofecoxib] Other (See Comments)   unknown      Medication List       Accurate as of 12/12/16  8:14 PM. Always use your most recent med list.          acetaminophen 325 MG tablet Commonly known as:  TYLENOL Take 650 mg by mouth every 4 (four) hours as needed for mild pain.   ALLEVYN AG GENTLE 4"X4" Pads Apply topically daily as needed (Replace as needed). Silver-sulfadiaz-foam bandage)   amoxicillin 500 MG tablet Commonly known as:  AMOXIL Take 500 mg by  mouth. Take one tablet one hour prior to surgery   aspirin 81 MG tablet Take 81 mg by mouth daily.   bimatoprost 0.01 % Soln Commonly known as:  LUMIGAN Place 1 drop into both eyes at bedtime.   carbamide peroxide 6.5 % OTIC solution Commonly known as:  DEBROX 5 drops into affected ear for 3 nights as needed for ear wax at bedtime   diclofenac sodium 1 % Gel Commonly known as:  VOLTAREN Apply four times a day as needed for pain   finasteride 5 MG tablet Commonly known as:  PROSCAR Take one tablet by mouth once daily   fluticasone 50 MCG/ACT nasal spray Commonly known as:  FLONASE Place 1 spray into both nostrils daily.   furosemide 20 MG tablet Commonly known as:  LASIX Take one tablet by mouth once daily. One twice weekly as needed for increased weight 2-3 lbs   guaiFENesin 600 MG 12 hr tablet Commonly known as:  MUCINEX Take 600 mg by mouth 2 (two) times daily.   ICAPS PO Take by mouth. Take one daily     KLOR-CON M10 10 MEQ tablet Generic drug:  potassium chloride Take 1 tablet by mouth twice daily  as needed with Lasix   lidocaine 4 % cream Commonly known as:  ASPERCREME W/LIDOCAINE Apply morning and evening to painful area of the lower back   lisinopril 20 MG tablet Commonly known as:  PRINIVIL,ZESTRIL Take one tablet daily for blood pressure   loratadine 10 MG tablet Commonly known as:  CLARITIN Take 10 mg by mouth daily. Reported on 07/13/2015   methadone 5 MG tablet Commonly known as:  DOLOPHINE Take one tablet by mouth every morning for pain; Take One and Half tablet (7.5mg ) by mouth every evening for pain.   metoprolol tartrate 25 MG tablet Commonly known as:  LOPRESSOR Take 25 mg by mouth 2 (two) times daily.   naproxen sodium 220 MG tablet Commonly known as:  ANAPROX Take 220 mg by mouth 2 (two) times daily.   nitroGLYCERIN 0.4 MG SL tablet Commonly known as:  NITROSTAT Place 0.4 mg under the tongue every 5 (five) minutes as needed. Reported on 07/13/2015   nystatin-triamcinolone cream Commonly known as:  MYCOLOG II Apply 1 application topically 2 (two) times daily.   omeprazole 20 MG capsule Commonly known as:  PRILOSEC Take 20 mg by mouth daily.   Polyethyl Glycol-Propyl Glycol 0.4-0.3 % Soln Apply to eye. One drop both eyes twice daily   polyethylene glycol packet Commonly known as:  MIRALAX / GLYCOLAX Take 17 g by mouth daily.   ROBAFEN AC 100-10 MG/5ML syrup Generic drug:  guaiFENesin-codeine Take 10 mLs by mouth 4 (four) times daily as needed for cough.   senna-docusate 8.6-50 MG tablet Commonly known as:  Senokot-S Take 1 tablet by mouth. Take one tablet at bedtime   sertraline 50 MG tablet Commonly known as:  ZOLOFT Take 50 mg by mouth. Take one tablet at bedtime   sodium chloride 0.65 % Soln nasal spray Commonly known as:  OCEAN 1 spray. Spray three times a day in both nostrils   tamsulosin 0.4 MG Caps capsule Commonly known as:   FLOMAX Take 0.4 mg by mouth daily.   zolpidem 5 MG tablet Commonly known as:  AMBIEN Take one tablet by mouth at bedtime for rest       Review of Systems  Constitutional: Negative.  Negative for activity change, appetite change, fatigue and unexpected weight change.  HENT: Negative  for congestion, hearing loss, rhinorrhea, sinus pain, sinus pressure, sneezing, sore throat and trouble swallowing.   Eyes: Negative for pain, discharge, redness and itching.       Hx macular degeneration.  Respiratory: Negative for apnea, chest tightness, shortness of breath, wheezing and stridor.        Chronic cough   Cardiovascular: Positive for leg swelling. Negative for chest pain and palpitations.  Gastrointestinal: Negative for abdominal distention, abdominal pain, constipation, diarrhea, nausea and vomiting.  Endocrine: Negative for cold intolerance, heat intolerance, polydipsia, polyphagia and polyuria.  Genitourinary: Negative for decreased urine volume, dysuria, flank pain, frequency and urgency.       Urine leaking at times prior to reaching the bathroom  Musculoskeletal: Positive for arthralgias and gait problem (uunstable. uses walker). Negative for neck pain and neck stiffness.       Chronic lower back pain current pain medication eases off pain.   Skin: Negative for color change, pallor and rash.       Skin redness to buttock   Neurological: Negative for dizziness, syncope, numbness and headaches.  Hematological: Does not bruise/bleed easily.  Psychiatric/Behavioral: Positive for sleep disturbance (insomnia. Ambien helps.). Negative for agitation, confusion and hallucinations.    Immunization History  Administered Date(s) Administered  . Influenza Whole 02/02/2013  . Influenza-Unspecified 02/18/2009, 02/14/2012, 01/29/2014, 01/28/2015, 02/03/2016  . PPD Test 04/25/2012, 05/09/2012  . Pneumococcal Conjugate-13 07/31/2003  . Pneumococcal Polysaccharide-23 04/24/2009  . Tdap 04/21/2012   . Zoster 07/31/2006, 04/25/2007   Pertinent  Health Maintenance Due  Topic Date Due  . INFLUENZA VACCINE  11/22/2016  . PNA vac Low Risk Adult  Completed   Fall Risk  12/11/2016 12/14/2015 07/13/2015 12/15/2014 08/04/2014  Falls in the past year? No Yes No No No  Comment - 12/11/15 - - -  Number falls in past yr: - 1 - - -  Injury with Fall? - No - - -  Risk for fall due to : - - - - History of fall(s);Impaired balance/gait   Functional Status Survey:    Vitals:   12/12/16 0912  BP: (!) 116/96  Pulse: 60  Resp: 18  Temp: 97.8 F (36.6 C)  SpO2: 97%  Weight: 179 lb 3.2 oz (81.3 kg)  Height: 5\' 8"  (1.727 m)   Body mass index is 27.25 kg/m. Physical Exam  Constitutional: He is oriented to person, place, and time. He appears well-developed and well-nourished. No distress.  Pleasantly elderly  HENT:  Head: Normocephalic.  Right Ear: External ear normal.  Left Ear: External ear normal.  Mouth/Throat: Oropharynx is clear and moist. No oropharyngeal exudate.  Eyes: Pupils are equal, round, and reactive to light. Conjunctivae and EOM are normal. Right eye exhibits no discharge. Left eye exhibits no discharge. No scleral icterus.  Neck: Normal range of motion. No JVD present. No thyromegaly present.  Cardiovascular: Normal rate, regular rhythm, normal heart sounds and intact distal pulses.  Exam reveals no gallop and no friction rub.   No murmur heard. Pulmonary/Chest: Effort normal and breath sounds normal. No respiratory distress. He has no wheezes. He has no rales.  Abdominal: Soft. Bowel sounds are normal. He exhibits no distension. There is no tenderness. There is no rebound and no guarding.  Musculoskeletal: He exhibits no tenderness or deformity.  Moves x 4 extremities. Unsteady gait uses FWW. Bilateral lower extremities trace edema knee high ted hose in place.    Lymphadenopathy:    He has no cervical adenopathy.  Neurological: He is oriented to  person, place, and time.  Coordination normal.  Skin: Skin is warm and dry. No rash noted. No pallor.  Gluteal/sacral area skin beefy redness noted   Psychiatric: He has a normal mood and affect.    Labs reviewed:  Recent Labs  05/22/16 06/22/16 09/14/16  NA 137 137 136*  K 4.6 4.7 4.7  BUN 40* 37* 37*  CREATININE 1.5* 1.3 1.5*    Recent Labs  05/04/16 05/11/16 09/14/16  AST  --  18 15  ALT 10  --  13  ALKPHOS  --  62 69    Recent Labs  05/11/16 09/14/16  WBC 6.2 5.4  HGB 12.1* 11.7*  HCT  --  35*  PLT 147* 138*   Lab Results  Component Value Date   TSH 3.55 09/14/2016   Lab Results  Component Value Date   HGBA1C 6.1 (H) 04/22/2012   Lab Results  Component Value Date   CHOL 149 04/22/2012   HDL 35 (L) 04/22/2012   LDLCALC 96 04/22/2012   TRIG 89 04/22/2012   CHOLHDL 4.3 04/22/2012    Significant Diagnostic Results in last 30 days:  No results found.  Assessment/Plan 1. Candidiasis Gluteal/sacral area skin beefy redness noted. Nystatin discontinued then start Nystatin- triamcinolone apply to sacral/gluteal area twice daily until redness resolve. Continue to monitor.   2. Hypertensive heart and chronic kidney disease with heart failure and with stage 5 chronic kidney disease, or end stage renal disease  B/p stable. Continue metoprolol 25 mg tablet twice daily, lisinopril 20 mg tablet daily and Furosemide 20 mg Tablet daily. On ASA for chest pain prophylaxis.Monitor BMP.   3. Chronic congestive heart failure No weight gain, shortness of breath or wheezing.Continue on metoprolol 25 mg tablet twice daily, lisinopril 20 mg tablet daily and Furosemide 20 mg Tablet daily.on Potassium chloride supplement. Continue to monitor weight.   4. Chronic low back pain with sciatica Chronic.Current pain regimen eases off the pain.Continue on methadone 5 mg tablet, Naproxen and Aspercreme.    5. Benign prostatic hyperplasia with urinary frequency Continue on finasteride 5 mg tablet and Tamsulosin  0.4 mg capsule. Continue to follow up with urology as directed.   6. Depression Stable.continue on sertraline 50 mg tablet daily. Monitor for mood changes.   Family/ staff Communication: Reviewed plan of care with patient and facility Nurse supervisor.   Labs/tests ordered: None   Dinah C Ngetich, NP

## 2016-12-26 ENCOUNTER — Other Ambulatory Visit: Payer: Self-pay | Admitting: *Deleted

## 2016-12-28 DIAGNOSIS — H35352 Cystoid macular degeneration, left eye: Secondary | ICD-10-CM | POA: Diagnosis not present

## 2016-12-28 DIAGNOSIS — H353134 Nonexudative age-related macular degeneration, bilateral, advanced atrophic with subfoveal involvement: Secondary | ICD-10-CM | POA: Diagnosis not present

## 2016-12-28 DIAGNOSIS — H353212 Exudative age-related macular degeneration, right eye, with inactive choroidal neovascularization: Secondary | ICD-10-CM | POA: Diagnosis not present

## 2016-12-28 DIAGNOSIS — H35351 Cystoid macular degeneration, right eye: Secondary | ICD-10-CM | POA: Diagnosis not present

## 2017-01-01 ENCOUNTER — Encounter: Payer: Self-pay | Admitting: Family

## 2017-01-01 ENCOUNTER — Non-Acute Institutional Stay: Payer: Medicare Other | Admitting: Family

## 2017-01-01 DIAGNOSIS — L89320 Pressure ulcer of left buttock, unstageable: Secondary | ICD-10-CM

## 2017-01-01 DIAGNOSIS — L89311 Pressure ulcer of right buttock, stage 1: Secondary | ICD-10-CM | POA: Diagnosis not present

## 2017-01-01 NOTE — Progress Notes (Signed)
Location:  [facility] Room Number: 4 Place of Service:  ALF [phone]) Provider: Dinah Ngetich FNP-C  Oneal Grout, MD  Patient Care Team: Oneal Grout, MD as PCP - General (Internal Medicine) Shelva Majestic, [facility], Man X, NP as Nurse Practitioner (Nurse Practitioner) Lyn Records, MD as Consulting Physician (Cardiology) Barron Alvine, MD as Consulting Physician (Urology) Francisca December., MD as Attending Physician (Cardiology) Leta Speller, MD as Consulting Physician (Dermatology) Elise Benne, MD as Consulting Physician (Ophthalmology)  Extended Emergency Contact Information Primary Emergency Contact: Mortimore,Janet Address: [address]          [place], [place] [address] [place] Home Phone: [phone] Mobile Phone: [phone] Relation: Daughter  Code Status:  DNR Goals of care: Advanced Directive information Advanced Directives 01/01/2017  Does Patient Have a Medical Advance Directive? Yes  Type of Advance Directive Out of facility DNR (pink MOST or yellow form);Living will;Healthcare Power of Attorney  Does patient want to make changes to medical advance directive? -  Copy of Healthcare Power of Attorney in Chart? Yes  Pre-existing out of facility DNR order (yellow form or pink MOST form) Yellow form placed in chart (order not valid for inpatient use);Pink MOST form placed in chart (order not valid for inpatient use)     Chief Complaint  Patient presents with  . Acute Visit    check rash/sore on bottom    HPI:  Pt is a 83 y.o. male seen today at [facility] for an acute visit for evaluation of redness and increased skin breakdown on buttocks.He is seen in his room today per facility Nurse request. Nurse reports patient's buttock crease with redness and has new skin breakdown and increased pain.Also states sleeps on recliner and does not change position.Nystatin cream applied to redness on gluteal crease  without any relief but has improved with Nystatin powder.he complains of pain when sitting down.He has a gel cushion on chair but does not use it on recliner. No fever or chills reported.   Past Medical History:  Diagnosis Date  . Abnormality of gait 06/11/2012  . Alcohol abuse 04/26/2012  . Allergy, unspecified not elsewhere classified 04/28/2012  . Atrial fibrillation (HCC) 04/28/2012  . BPH (benign prostatic hyperplasia)   . CAD (coronary artery disease)    s/p CABG 2005  . Cardiac pacemaker in situ 04/28/2012  . CHF (congestive heart failure) (HCC) 04/23/2012  . Chronic airway obstruction, not elsewhere classified 04/26/2012  . Congestive heart failure, unspecified 04/28/2012  . Coronary atherosclerosis of native coronary artery 04/28/2012  . Degenerative joint disease 04/21/2012  . Depression    mild per pt  . Edema 04/28/2012  . Essential hypertension 10/17/2012  . Fall 04/21/2012   Prolonged downtime 04/20/2012   . GERD (gastroesophageal reflux disease) 09/13/2012  . Hip pain, right 09/02/2013  . History of rhabdomyolysis   . HTN (hypertension)   . Hyperlipidemia   . Hypertrophy of prostate without urinary obstruction and other lower urinary tract symptoms (LUTS) 04/28/2012  . Ingrowing nail 06/11/2012  . Insomnia 11/15/2012  . Insomnia, unspecified 04/28/2012  . Lumbago   . Macular degeneration   . Major depressive disorder, single episode, unspecified 04/28/2012  . Muscle weakness (generalized) 04/26/2012  . NSTEMI (non-ST elevated myocardial infarction) (HCC) 04/23/2012   Related to the stress of rhabdomyolysis and prolonged downtime prior to admission in a patient with known coronary artery disease   . Osteoarthritis   . Other specified disease of white blood  cells 04/26/2012  . Pacemaker 04/21/2012   Near EOL.   . Paroxysmal atrial tachycardia (HCC) 04/21/2012   Brief episodes. Not felt to be an anticoagulation candidate because of age and frailty   . Pericarditis 04/21/2012  . Personal  history of fall 04/28/2012  . Reflux esophagitis 04/28/2012  . Rhabdomyolysis   . Seborrheic eczema 12/03/2012  . Sick sinus syndrome (HCC) 10/14/2003; 10/29/2012   MDT EnPulse implanted by Dr Amil AmenEdmunds for SSS; generator change 10/29/2012 by Dr Johney FrameAllred MDT Jana [employer]Sensia pacemaker  . Sporotrichosis    remote  . Synovial cyst of wrist 11/10/2014   Left wrist    . Tachy-brady syndrome (HCC) 04/21/2012   Pacemaker is near EOL   . Unspecified constipation 09/13/2012  . Unspecified glaucoma(365.9) 04/28/2012   Past Surgical History:  Procedure Laterality Date  . ANGIOPLASTY  9/11/190   Dr. Katrinka BlazingSmith  . APPENDECTOMY    . BACK SURGERY  03/11/2001  . bilateral knee arthroplasty  09/28/83; 05/17/1992   right then left  . CARPAL TUNNEL RELEASE Left   . CARPAL TUNNEL RELEASE Right 2007  . CATARACT EXTRACTION Right 10/30/1996  . CORONARY ARTERY BYPASS GRAFT  2005  . CYSTOSCOPY  08/14/85  . ELBOW SURGERY     repair  . EPIDIDYMIS SURGERY  09/17/1985  . HERNIA REPAIR Right 05/19/1976  . PACEMAKER GENERATOR CHANGE  10/29/12   Dr. Johney FrameAllred  . PACEMAKER GENERATOR CHANGE N/A 10/29/2012   Procedure: PACEMAKER GENERATOR CHANGE;  Surgeon: Hillis RangeJames Allred, MD;  [facility];  Service: Cardiovascular;  Laterality: N/A;  . PACEMAKER INSERTION  10/14/2003;10/29/2012   MDT Implanted by Dr Amil AmenEdmunds for SSS; generator change 10/29/2012 by Dr Johney FrameAllred Medtronic [place]  . REPLACEMENT TOTAL KNEE BILATERAL  1992 and 1996  . ROTATOR CUFF REPAIR Right 2008  . SHOULDER OPEN ROTATOR CUFF REPAIR Left 02/07/1996  . SIGMOIDOSCOPY  08/26/2001  . TOE SURGERY Right 2005   correction  . TRANSURETHRAL RESECTION OF PROSTATE  07/27/1983  . YAG LASER APPLICATION Right 12/26/1973   eye  . YAG LASER APPLICATION Right 04/30/2001  . YAG LASER APPLICATION Left 05/07/2001    Allergies  Allergen Reactions  . Aleve [Naproxen Sodium]   . Celebrex [Celecoxib]     Lips swelled  . Sulfur     Lips swelled  . Vioxx [Rofecoxib] Other (See Comments)    unknown     Outpatient Encounter Prescriptions as of 01/01/2017  Medication Sig  . acetaminophen (TYLENOL) 325 MG tablet Take 650 mg by mouth every 4 (four) hours as needed for mild pain.  Marland Kitchen. amoxicillin (AMOXIL) 500 MG tablet Take 500 mg by mouth. Take one tablet one hour prior to surgery  . aspirin 81 MG tablet Take 81 mg by mouth daily.  . bimatoprost (LUMIGAN) 0.01 % SOLN Place 1 drop into both eyes at bedtime.   . carbamide peroxide (DEBROX) 6.5 % otic solution 5 drops into affected ear for 3 nights as needed for ear wax at bedtime  . diclofenac sodium (VOLTAREN) 1 % GEL Apply four times a day as needed for pain  . finasteride (PROSCAR) 5 MG tablet Take one tablet by mouth once daily  . fluticasone (FLONASE) 50 MCG/ACT nasal spray Place 1 spray into both nostrils daily.   . furosemide (LASIX) 20 MG tablet Take one tablet by mouth once daily. One twice weekly as needed for increased weight 2-3 lbs  . guaiFENesin (MUCINEX) 600 MG 12 hr tablet Take 600 mg by mouth 2 (two) times daily.   .Marland Kitchen  guaiFENesin-codeine (ROBAFEN AC) 100-10 MG/5ML syrup Take 10 mLs by mouth 4 (four) times daily as needed for cough.  Marland Kitchen HYDROCORTISONE EX Apply 1 application topically 2 (two) times daily as needed. For itching  . KLOR-CON M10 10 MEQ tablet Take 1 tablet by mouth twice daily  as needed with Lasix  . lidocaine (ASPERCREME W/LIDOCAINE) 4 % cream Apply morning and evening to painful area of the lower back  . lisinopril (PRINIVIL,ZESTRIL) 20 MG tablet Take one tablet daily for blood pressure  . loratadine (CLARITIN) 10 MG tablet Take 10 mg by mouth daily. Reported on 07/13/2015  . methadone (DOLOPHINE) 5 MG tablet Take one tablet by mouth every morning for pain; Take One and Half tablet (7.5mg ) by mouth every evening for pain.  . metoprolol tartrate (LOPRESSOR) 25 MG tablet Take 25 mg by mouth 2 (two) times daily.  . Multiple Vitamins-Minerals (ICAPS PO) Take by mouth. Take one daily  . naproxen sodium (ANAPROX) 220 MG  tablet Take 220 mg by mouth 2 (two) times daily.  . nitroGLYCERIN (NITROSTAT) 0.4 MG SL tablet Place 0.4 mg under the tongue every 5 (five) minutes as needed. Reported on 07/13/2015  . nystatin (MYCOSTATIN/NYSTOP) powder Apply 1 g topically 2 (two) times daily.  Marland Kitchen omeprazole (PRILOSEC) 20 MG capsule Take 20 mg by mouth daily.  Bertram Gala Glycol-Propyl Glycol 0.4-0.3 % SOLN Apply to eye. One drop both eyes twice daily  . polyethylene glycol (MIRALAX / GLYCOLAX) packet Take 17 g by mouth daily.  Marland Kitchen senna-docusate (SENOKOT-S) 8.6-50 MG tablet Take 1 tablet by mouth. Take one tablet at bedtime  . sertraline (ZOLOFT) 50 MG tablet Take 50 mg by mouth. Take one tablet at bedtime  . Silver (ALLEVYN AG GENTLE) 4"X4" PADS Apply topically daily as needed (Replace as needed). Silver-sulfadiaz-foam bandage)  . sodium chloride (OCEAN) 0.65 % SOLN nasal spray 1 spray. Spray three times a day in both nostrils  . Tamsulosin HCl (FLOMAX) 0.4 MG CAPS Take 0.4 mg by mouth daily.  Marland Kitchen zolpidem (AMBIEN) 5 MG tablet Take one tablet by mouth at bedtime for rest  . [DISCONTINUED] nystatin-triamcinolone (MYCOLOG II) cream Apply 1 application topically 2 (two) times daily.   No facility-administered encounter medications on file as of 01/01/2017.     Review of Systems  Constitutional: Negative for activity change, appetite change, chills, fatigue and fever.  HENT: Negative for congestion, rhinorrhea, sinus pain, sinus pressure, sneezing and sore throat.   Respiratory: Negative for cough, chest tightness, shortness of breath and wheezing.   Cardiovascular: Negative for chest pain, palpitations and leg swelling.  Gastrointestinal: Negative for abdominal distention, abdominal pain, constipation, diarrhea, nausea and vomiting.  Musculoskeletal: Positive for arthralgias and gait problem.  Skin: Negative for color change, pallor and rash.       redness and pressure ulcer per HPI   Neurological: Negative for dizziness, syncope,  light-headedness and headaches.  Psychiatric/Behavioral: Negative for agitation, confusion and sleep disturbance. The patient is not nervous/anxious.     Immunization History  Administered Date(s) Administered  . Influenza Whole 02/11/2013  . Influenza-Unspecified 02/18/2009, 02/14/2012, 01/29/2014, 01/28/2015, 02/03/2016  . PPD Test 12/18/2000, 04/25/2012, 05/09/2012  . Pneumococcal Conjugate-13 07/31/2003  . Pneumococcal Polysaccharide-23 04/24/2009  . Tdap 04/21/2012  . Zoster 07/31/2006, 04/25/2007   Pertinent  Health Maintenance Due  Topic Date Due  . INFLUENZA VACCINE  11/22/2016  . PNA vac Low Risk Adult  Completed   Fall Risk  12/11/2016 12/14/2015 07/13/2015 12/15/2014 08/04/2014  Falls in the past  year? No Yes No No No  Comment - 12/11/15 - - -  Number falls in past yr: - 1 - - -  Injury with Fall? - No - - -  Risk for fall due to : - - - - History of fall(s);Impaired balance/gait   Functional Status Survey:    Vitals:   01/01/17 1028  BP: (!) 148/88  Pulse: 62  Resp: 20  Temp: (!) 96.4 F (35.8 C)  SpO2: 96%  Weight: 180 lb (81.6 kg)  Height:  (1.727 m)   Body mass index is 27.37 kg/m. Physical Exam  Constitutional: He appears well-developed and well-nourished. No distress.  Elderly in no acute distress   HENT:  Head: Normocephalic.  Mouth/Throat: Oropharynx is clear and moist. No oropharyngeal exudate.  Eyes: Pupils are equal, round, and reactive to light. Conjunctivae and EOM are normal. Right eye exhibits no discharge. Left eye exhibits no discharge. No scleral icterus.  Neck: Normal range of motion. No JVD present. No thyromegaly present.  Cardiovascular: Normal rate, regular rhythm, normal heart sounds and intact distal pulses.  Exam reveals no gallop and no friction rub.   No murmur heard. Pulmonary/Chest: Effort normal and breath sounds normal. No respiratory distress. He has no wheezes. He has no rales.  Abdominal: Soft. Bowel sounds are normal.  He exhibits no distension. There is no tenderness. There is no rebound.  Musculoskeletal: He exhibits no edema or tenderness.  Unsteady gait uses walker   Lymphadenopathy:    He has no cervical adenopathy.  Neurological: Coordination normal.  Skin: Skin is warm and dry. No rash noted. No erythema. No pallor.  # 1. Right gluteal stage 1 dime size pressure ulcer wound bed red without any drainage.Surrounding skin tissue without any signs of infections.  # 2. Left gluteal unstage able ulcer without any drainage surrounding skin tissue maceration noted.     Labs reviewed:  Recent Labs  05/22/16 06/22/16 09/14/16  NA 137 137 136*  K 4.6 4.7 4.7  BUN 40* 37* 37*  CREATININE 1.5* 1.3 1.5*    Recent Labs  05/04/16 05/11/16 09/14/16  AST  --  18 15  ALT 10  --  13  ALKPHOS  --  62 69    Recent Labs  05/11/16 09/14/16  WBC 6.2 5.4  HGB 12.1* 11.7*  HCT  --  35*  PLT 147* 138*   Lab Results  Component Value Date   TSH 3.55 09/14/2016   Lab Results  Component Value Date   HGBA1C 6.1 (H) 04/22/2012   Lab Results  Component Value Date   CHOL 149 04/22/2012   HDL 35 (L) 04/22/2012   LDLCALC 96 04/22/2012   TRIG 89 04/22/2012   CHOLHDL 4.3 04/22/2012    Significant Diagnostic Results in last 30 days:  No results found.  Assessment/Plan 1. Decubitus ulcer of right buttock, stage 1 Afebrile.Right gluteal stage 1 dime size pressure ulcer wound bed red without any drainage.Surrounding skin tissue without any signs of infections.Facility Nurse to cleanse with saline, pat dry and cover with allyvn foam dressing. Change dressing every 3 days.Encourage to reposition frequently in bed.Monitor for signs of infections.  2. Decubitus ulcer of left buttock, unstageable Left gluteal unstage able ulcer without any drainage surrounding skin tissue maceration noted.dressings changes as above.    Family/ staff Communication: Reviewed plan of care with patient and facility  Nurse  Labs/tests ordered: None   Donalee Citrin Ngetich, NP

## 2017-01-04 DIAGNOSIS — H353123 Nonexudative age-related macular degeneration, left eye, advanced atrophic without subfoveal involvement: Secondary | ICD-10-CM | POA: Diagnosis not present

## 2017-01-04 DIAGNOSIS — H401131 Primary open-angle glaucoma, bilateral, mild stage: Secondary | ICD-10-CM | POA: Diagnosis not present

## 2017-01-09 ENCOUNTER — Telehealth: Payer: Self-pay | Admitting: Cardiology

## 2017-01-09 ENCOUNTER — Ambulatory Visit (INDEPENDENT_AMBULATORY_CARE_PROVIDER_SITE_OTHER): Payer: Medicare Other | Admitting: *Deleted

## 2017-01-09 DIAGNOSIS — I495 Sick sinus syndrome: Secondary | ICD-10-CM | POA: Diagnosis not present

## 2017-01-09 NOTE — Telephone Encounter (Signed)
Confirmed remote transmission w/ pt nurse.   

## 2017-01-10 LAB — CUP PACEART REMOTE DEVICE CHECK
Battery Impedance: 508 Ohm
Battery Remaining Longevity: 72 mo
Brady Statistic AP VP Percent: 98 %
Brady Statistic AS VP Percent: 2 %
Brady Statistic AS VS Percent: 0 %
Implantable Lead Implant Date: 20050622
Implantable Lead Location: 753860
Implantable Lead Model: 5076
Implantable Lead Model: 5092
Implantable Pulse Generator Implant Date: 20140708
Lead Channel Impedance Value: 448 Ohm
Lead Channel Impedance Value: 714 Ohm
Lead Channel Pacing Threshold Amplitude: 0.5 V
Lead Channel Pacing Threshold Amplitude: 0.5 V
Lead Channel Setting Pacing Amplitude: 2.5 V
MDC IDC LEAD IMPLANT DT: 20050622
MDC IDC LEAD LOCATION: 753859
MDC IDC MSMT BATTERY VOLTAGE: 2.78 V
MDC IDC MSMT LEADCHNL RA PACING THRESHOLD PULSEWIDTH: 0.4 ms
MDC IDC MSMT LEADCHNL RV PACING THRESHOLD PULSEWIDTH: 0.4 ms
MDC IDC SESS DTM: 20180918175758
MDC IDC SET LEADCHNL RA PACING AMPLITUDE: 2 V
MDC IDC SET LEADCHNL RV PACING PULSEWIDTH: 0.4 ms
MDC IDC SET LEADCHNL RV SENSING SENSITIVITY: 4 mV
MDC IDC STAT BRADY AP VS PERCENT: 0 %

## 2017-01-10 NOTE — Progress Notes (Signed)
Remote pacemaker transmission.   

## 2017-01-11 ENCOUNTER — Encounter: Payer: Self-pay | Admitting: Cardiology

## 2017-01-30 ENCOUNTER — Non-Acute Institutional Stay: Payer: Medicare Other | Admitting: Family

## 2017-01-30 DIAGNOSIS — Z792 Long term (current) use of antibiotics: Secondary | ICD-10-CM

## 2017-02-02 DIAGNOSIS — M79671 Pain in right foot: Secondary | ICD-10-CM | POA: Diagnosis not present

## 2017-02-02 DIAGNOSIS — M79672 Pain in left foot: Secondary | ICD-10-CM | POA: Diagnosis not present

## 2017-02-02 DIAGNOSIS — B351 Tinea unguium: Secondary | ICD-10-CM | POA: Diagnosis not present

## 2017-02-04 NOTE — Progress Notes (Signed)
Location:  [facility] Room Number: 4 Place of Service:  [phone]) Provider: Mirabelle Cyphers FNP-C  Oneal Grout, MD  Patient Care Team: Oneal Grout, MD as PCP - General (Internal Medicine) Shelva Majestic, [facility], Man X, NP as Nurse Practitioner (Nurse Practitioner) Lyn Records, MD as Consulting Physician (Cardiology) Barron Alvine, MD as Consulting Physician (Urology) Francisca December., MD as Attending Physician (Cardiology) Leta Speller, MD as Consulting Physician (Dermatology) Elise Benne, MD as Consulting Physician (Ophthalmology)  Extended Emergency Contact Information Primary Emergency Contact: Mortimore,Janet Address: [address]          [place], [place] [address] [place] Home Phone: [phone] Mobile Phone: [phone] Relation: Daughter  Code Status:  DNR Goals of care: Advanced Directive information Advanced Directives 01/01/2017  Does Patient Have a Medical Advance Directive? Yes  Type of Advance Directive Out of facility DNR (pink MOST or yellow form);Living will;Healthcare Power of Attorney  Does patient want to make changes to medical advance directive? -  Copy of Healthcare Power of Attorney in Chart? Yes  Pre-existing out of facility DNR order (yellow form or pink MOST form) Yellow form placed in chart (order not valid for inpatient use);Pink MOST form placed in chart (order not valid for inpatient use)     Chief Complaint  Patient presents with  . Acute Visit    evaluation prior to dental procedure    HPI:  Pt is a 83 y.o. male seen today at [facility] for an acute visit for evaluation of prior to dental procedure.He is in his room today per facility nurse report. Nurse states received request from Dentist Dr.Diane Christella Hartigan requesting PCP to order Amoxicillin 500 mg tablet patient to take 4 tablets one hour prior to major dental works. Provider discussed need for antibiotic prophylaxis with  patient. However, patient states does not understand why he needs tooth extraction and filling since his former dentist had done all the procedure. He also states does not have any pain,swelling or sensitivity to teeth. He request for dentist to order x-rays to be done prior to procedure." I was just trying to transfer from my former dentist to in house dentist for convince".  He is worried that procedure billy of $ 2890 that was mailed to him from the dentist office he too expensive to pay out of pocket.He request to hold on prophylaxis antibiotics until he talks with the dentist. Facility Nurse notify to contact dentist office and arrange appointment for patient to talk with dentist.     Past Medical History:  Diagnosis Date  . Abnormality of gait 06/11/2012  . Alcohol abuse 04/26/2012  . Allergy, unspecified not elsewhere classified 04/28/2012  . Atrial fibrillation (HCC) 04/28/2012  . BPH (benign prostatic hyperplasia)   . CAD (coronary artery disease)    s/p CABG 2005  . Cardiac pacemaker in situ 04/28/2012  . CHF (congestive heart failure) (HCC) 04/23/2012  . Chronic airway obstruction, not elsewhere classified 04/26/2012  . Congestive heart failure, unspecified 04/28/2012  . Coronary atherosclerosis of native coronary artery 04/28/2012  . Degenerative joint disease 04/21/2012  . Depression    mild per pt  . Edema 04/28/2012  . Essential hypertension 10/17/2012  . Fall 04/21/2012   Prolonged downtime 04/20/2012   . GERD (gastroesophageal reflux disease) 09/13/2012  . Hip pain, right 09/02/2013  . History of rhabdomyolysis   . HTN (hypertension)   . Hyperlipidemia   . Hypertrophy of prostate without urinary obstruction and  other lower urinary tract symptoms (LUTS) 04/28/2012  . Ingrowing nail 06/11/2012  . Insomnia 11/15/2012  . Insomnia, unspecified 04/28/2012  . Lumbago   . Macular degeneration   . Major depressive disorder, single episode, unspecified 04/28/2012  . Muscle weakness (generalized)  04/26/2012  . NSTEMI (non-ST elevated myocardial infarction) (HCC) 04/23/2012   Related to the stress of rhabdomyolysis and prolonged downtime prior to admission in a patient with known coronary artery disease   . Osteoarthritis   . Other specified disease of white blood cells 04/26/2012  . Pacemaker 04/21/2012   Near EOL.   . Paroxysmal atrial tachycardia (HCC) 04/21/2012   Brief episodes. Not felt to be an anticoagulation candidate because of age and frailty   . Pericarditis 04/21/2012  . Personal history of fall 04/28/2012  . Reflux esophagitis 04/28/2012  . Rhabdomyolysis   . Seborrheic eczema 12/03/2012  . Sick sinus syndrome (HCC) 10/14/2003; 10/29/2012   MDT EnPulse implanted by Dr Amil Amen for SSS; generator change 10/29/2012 by Dr Johney Frame MDT Jana Half pacemaker  . Sporotrichosis    remote  . Synovial cyst of wrist 11/10/2014   Left wrist    . Tachy-brady syndrome (HCC) 04/21/2012   Pacemaker is near EOL   . Unspecified constipation 09/13/2012  . Unspecified glaucoma(365.9) 04/28/2012   Past Surgical History:  Procedure Laterality Date  . ANGIOPLASTY  9/11/190   Dr. Katrinka Blazing  . APPENDECTOMY    . BACK SURGERY  03/11/2001  . bilateral knee arthroplasty  09/28/83; 05/17/1992   right then left  . CARPAL TUNNEL RELEASE Left   . CARPAL TUNNEL RELEASE Right 2007  . CATARACT EXTRACTION Right 10/30/1996  . CORONARY ARTERY BYPASS GRAFT  2005  . CYSTOSCOPY  08/14/85  . ELBOW SURGERY     repair  . EPIDIDYMIS SURGERY  09/17/1985  . HERNIA REPAIR Right 05/19/1976  . PACEMAKER GENERATOR CHANGE  10/29/12   Dr. Johney Frame  . PACEMAKER GENERATOR CHANGE N/A 10/29/2012   Procedure: PACEMAKER GENERATOR CHANGE;  Surgeon: Hillis Range, MD;  Location: [facility] CATH LAB;  Service: Cardiovascular;  Laterality: N/A;  . PACEMAKER INSERTION  10/14/2003;10/29/2012   MDT Implanted by Dr Amil Amen for SSS; generator change 10/29/2012 by Dr Johney Frame [employer] [place]  . REPLACEMENT TOTAL KNEE BILATERAL  1992 and 1996  . ROTATOR CUFF REPAIR Right  2008  . SHOULDER OPEN ROTATOR CUFF REPAIR Left 02/07/1996  . SIGMOIDOSCOPY  08/26/2001  . TOE SURGERY Right 2005   correction  . TRANSURETHRAL RESECTION OF PROSTATE  07/27/1983  . YAG LASER APPLICATION Right 12/26/1973   eye  . YAG LASER APPLICATION Right 04/30/2001  . YAG LASER APPLICATION Left 05/07/2001    Allergies  Allergen Reactions  . Aleve [Naproxen Sodium]   . Celebrex [Celecoxib]     Lips swelled  . Sulfur     Lips swelled  . Vioxx [Rofecoxib] Other (See Comments)    unknown    Outpatient Encounter Prescriptions as of 01/30/2017  Medication Sig  . acetaminophen (TYLENOL) 325 MG tablet Take 650 mg by mouth every 4 (four) hours as needed for mild pain.  Marland Kitchen amoxicillin (AMOXIL) 500 MG tablet Take 500 mg by mouth. Take one tablet one hour prior to surgery  . aspirin 81 MG tablet Take 81 mg by mouth daily.  . bimatoprost (LUMIGAN) 0.01 % SOLN Place 1 drop into both eyes at bedtime.   . carbamide peroxide (DEBROX) 6.5 % otic solution 5 drops into affected ear for 3 nights as needed for ear wax at bedtime  .  diclofenac sodium (VOLTAREN) 1 % GEL Apply four times a day as needed for pain  . finasteride (PROSCAR) 5 MG tablet Take one tablet by mouth once daily  . fluticasone (FLONASE) 50 MCG/ACT nasal spray Place 1 spray into both nostrils daily.   . furosemide (LASIX) 20 MG tablet Take one tablet by mouth once daily. One twice weekly as needed for increased weight 2-3 lbs  . guaiFENesin (MUCINEX) 600 MG 12 hr tablet Take 600 mg by mouth 2 (two) times daily.   Marland Kitchen guaiFENesin-codeine (ROBAFEN AC) 100-10 MG/5ML syrup Take 10 mLs by mouth 4 (four) times daily as needed for cough.  Marland Kitchen HYDROCORTISONE EX Apply 1 application topically 2 (two) times daily as needed. For itching  . KLOR-CON M10 10 MEQ tablet Take 1 tablet by mouth twice daily  as needed with Lasix  . lidocaine (ASPERCREME W/LIDOCAINE) 4 % cream Apply morning and evening to painful area of the lower back  . lisinopril  (PRINIVIL,ZESTRIL) 20 MG tablet Take one tablet daily for blood pressure  . loratadine (CLARITIN) 10 MG tablet Take 10 mg by mouth daily. Reported on 07/13/2015  . methadone (DOLOPHINE) 5 MG tablet Take one tablet by mouth every morning for pain; Take One and Half tablet (7.5mg ) by mouth every evening for pain.  . metoprolol tartrate (LOPRESSOR) 25 MG tablet Take 25 mg by mouth 2 (two) times daily.  . Multiple Vitamins-Minerals (ICAPS PO) Take by mouth. Take one daily  . naproxen sodium (ANAPROX) 220 MG tablet Take 220 mg by mouth 2 (two) times daily.  . nitroGLYCERIN (NITROSTAT) 0.4 MG SL tablet Place 0.4 mg under the tongue every 5 (five) minutes as needed. Reported on 07/13/2015  . nystatin (MYCOSTATIN/NYSTOP) powder Apply 1 g topically 2 (two) times daily.  Marland Kitchen omeprazole (PRILOSEC) 20 MG capsule Take 20 mg by mouth daily.  Bertram Gala Glycol-Propyl Glycol 0.4-0.3 % SOLN Apply to eye. One drop both eyes twice daily  . polyethylene glycol (MIRALAX / GLYCOLAX) packet Take 17 g by mouth daily.  Marland Kitchen senna-docusate (SENOKOT-S) 8.6-50 MG tablet Take 1 tablet by mouth. Take one tablet at bedtime  . sertraline (ZOLOFT) 50 MG tablet Take 50 mg by mouth. Take one tablet at bedtime  . Silver (ALLEVYN AG GENTLE) 4"X4" PADS Apply topically daily as needed (Replace as needed). Silver-sulfadiaz-foam bandage)  . sodium chloride (OCEAN) 0.65 % SOLN nasal spray 1 spray. Spray three times a day in both nostrils  . Tamsulosin HCl (FLOMAX) 0.4 MG CAPS Take 0.4 mg by mouth daily.  Marland Kitchen zolpidem (AMBIEN) 5 MG tablet Take one tablet by mouth at bedtime for rest   No facility-administered encounter medications on file as of 01/30/2017.     Review of Systems  Constitutional: Negative for activity change, appetite change, chills, fatigue and fever.  HENT: Negative for congestion, dental problem, ear pain, mouth sores, rhinorrhea, sinus pain, sinus pressure, sneezing, sore throat and trouble swallowing.   Eyes: Negative for  pain, discharge and redness.  Respiratory: Negative for cough, chest tightness, shortness of breath and wheezing.   Cardiovascular: Positive for leg swelling. Negative for chest pain and palpitations.  Gastrointestinal: Negative for abdominal distention, abdominal pain, constipation, diarrhea, nausea and vomiting.  Musculoskeletal: Positive for gait problem.  Skin: Negative for color change, pallor and rash.  Neurological: Negative for dizziness, seizures, syncope, light-headedness and headaches.  Hematological: Does not bruise/bleed easily.  Psychiatric/Behavioral: Negative for agitation, confusion and sleep disturbance. The patient is not nervous/anxious.     Immunization  History  Administered Date(s) Administered  . Influenza Whole 02/11/2013  . Influenza-Unspecified 02/18/2009, 02/14/2012, 01/29/2014, 01/28/2015, 02/03/2016  . PPD Test 12/18/2000, 04/25/2012, 05/09/2012  . Pneumococcal Conjugate-13 07/31/2003  . Pneumococcal Polysaccharide-23 04/24/2009  . Tdap 04/21/2012  . Zoster 07/31/2006, 04/25/2007   Pertinent  Health Maintenance Due  Topic Date Due  . INFLUENZA VACCINE  11/22/2016  . PNA vac Low Risk Adult  Completed   Fall Risk  12/11/2016 12/14/2015 07/13/2015 12/15/2014 08/04/2014  Falls in the past year? No Yes No No No  Comment - 12/11/15 - - -  Number falls in past yr: - 1 - - -  Injury with Fall? - No - - -  Risk for fall due to : - - - - History of fall(s);Impaired balance/gait    Vitals:   01/30/17 1411  BP: 109/62  Pulse: 62  Resp: 20  Temp: 98.2 F (36.8 C)  SpO2: 96%  Weight: 180 lb (81.6 kg)  Height:  (1.727 m)   Body mass index is 27.37 kg/m. Physical Exam  Constitutional: He appears well-developed and well-nourished. No distress.  elderly  HENT:  Head: Normocephalic.  Right Ear: External ear normal.  Left Ear: External ear normal.  Mouth/Throat: Oropharynx is clear and moist. No oropharyngeal exudate.  Upper and lower premolars and  molars fillings. No dental cavities, redness,swelling or bleeding noted.   Eyes: Pupils are equal, round, and reactive to light. Conjunctivae and EOM are normal. Right eye exhibits no discharge. Left eye exhibits no discharge. No scleral icterus.  Neck: Normal range of motion. No JVD present. No thyromegaly present.  Pulmonary/Chest: Effort normal and breath sounds normal. No respiratory distress. He has no wheezes. He has no rales.  Lymphadenopathy:    He has no cervical adenopathy.    Labs reviewed:  Recent Labs  05/22/16 06/22/16 09/14/16  NA 137 137 136*  K 4.6 4.7 4.7  BUN 40* 37* 37*  CREATININE 1.5* 1.3 1.5*    Recent Labs  05/04/16 05/11/16 09/14/16  AST  --  18 15  ALT 10  --  13  ALKPHOS  --  62 69    Recent Labs  05/11/16 09/14/16  WBC 6.2 5.4  HGB 12.1* 11.7*  HCT  --  35*  PLT 147* 138*   Lab Results  Component Value Date   TSH 3.55 09/14/2016   Lab Results  Component Value Date   HGBA1C 6.1 (H) 04/22/2012   Lab Results  Component Value Date   CHOL 149 04/22/2012   HDL 35 (L) 04/22/2012   LDLCALC 96 04/22/2012   TRIG 89 04/22/2012   CHOLHDL 4.3 04/22/2012    Significant Diagnostic Results in last 30 days:  No results found.  Assessment/Plan Prophylactic antibiotic prior to dental procedure:  Exam findings negative.Medical history reviewed has hx of pericarditis.Dr.Diane Christella Hartigan requesting PCP to order Amoxicillin 500 mg tablet patient to take 4 tablets one hour prior to major dental works. Provider discussed need for antibiotic prophylaxis with patient. However, patient states does not understand why he needs tooth extraction and filling since his former dentist had done all the procedure. He also states does not have any pain,swelling or sensitivity to teeth. He request for dentist to order x-rays to be done prior to procedure." I was just trying to transfer from my former dentist to in house dentist for convince".  He is worried that procedure bill  of $ 2890 that was mailed to him from the dentist office he too  expensive to pay out of pocket.He request to hold on prophylaxis antibiotics until he talks with the dentist. Facility Nurse notify to contact dentist office and arrange appointment for patient to talk with dentist.  Family/ staff Communication: Reviewed plan of care with patient and facility Nurse supervisor  Labs/tests ordered: None   Caesar Bookman, NP

## 2017-02-05 ENCOUNTER — Non-Acute Institutional Stay: Payer: Medicare Other | Admitting: Family

## 2017-02-05 ENCOUNTER — Encounter: Payer: Self-pay | Admitting: Family

## 2017-02-05 DIAGNOSIS — Y92129 Unspecified place in nursing home as the place of occurrence of the external cause: Secondary | ICD-10-CM

## 2017-02-05 DIAGNOSIS — I10 Essential (primary) hypertension: Secondary | ICD-10-CM

## 2017-02-05 DIAGNOSIS — M79652 Pain in left thigh: Secondary | ICD-10-CM | POA: Diagnosis not present

## 2017-02-05 DIAGNOSIS — R2681 Unsteadiness on feet: Secondary | ICD-10-CM

## 2017-02-05 DIAGNOSIS — W19XXXA Unspecified fall, initial encounter: Secondary | ICD-10-CM | POA: Diagnosis not present

## 2017-02-05 DIAGNOSIS — M25552 Pain in left hip: Secondary | ICD-10-CM

## 2017-02-05 DIAGNOSIS — M545 Low back pain: Secondary | ICD-10-CM | POA: Diagnosis not present

## 2017-02-05 MED ORDER — ACETAMINOPHEN 500 MG PO TABS: 500.0000 mg | ORAL_TABLET | Freq: Three times a day (TID) | ORAL | 0 refills | Status: AC

## 2017-02-05 NOTE — Progress Notes (Signed)
Location:  [facility] Room Number: 4 Place of Service:  ALF [phone]) Provider: Arnold Depinto FNP-C  Oneal Grout, MD  Patient Care Team: Oneal Grout, MD as PCP - General (Internal Medicine) Shelva Majestic, [facility], Man X, NP as Nurse Practitioner (Nurse Practitioner) Lyn Records, MD as Consulting Physician (Cardiology) Barron Alvine, MD as Consulting Physician (Urology) Francisca December., MD as Attending Physician (Cardiology) Leta Speller, MD as Consulting Physician (Dermatology) Elise Benne, MD as Consulting Physician (Ophthalmology)  Extended Emergency Contact Information Primary Emergency Contact: Mortimore,Janet Address: [address]          [place], [place] [address] [place] Home Phone: [phone] Mobile Phone: [phone] Relation: Daughter  Code Status:  DNR Goals of care: Advanced Directive information Advanced Directives 02/05/2017  Does Patient Have a Medical Advance Directive? Yes  Type of Advance Directive Out of facility DNR (pink MOST or yellow form);Living will;Healthcare Power of Attorney  Does patient want to make changes to medical advance directive? -  Copy of Healthcare Power of Attorney in Chart? Yes  Pre-existing out of facility DNR order (yellow form or pink MOST form) Yellow form placed in chart (order not valid for inpatient use);Pink MOST form placed in chart (order not valid for inpatient use)     Chief Complaint  Patient presents with  . Acute Visit    fall with bruising, swelling and pain to back    HPI:  Pt is a 83 y.o. male seen today at [facility] for an acute visit for evaluation of hip and back pain post fall. He is seen in his room today with CMA at bedside.He states was trying to pickup a napkin from the floor 02/04/2017 when he fell on the dinning room floor.He was assisted up by facility staff. He did not have pain then but now complains of left hip/back pain. Facility  Nurse reports patient has bruise to left hip. He states pain worst with movement.current pain medication has not relief pain.SB/p in the 160's-170's; b/p log readings reviewed B/p ranging in the 100's/60's-130's/60's. Elevated SBP possible due to pain.      Past Medical History:  Diagnosis Date  . Abnormality of gait 06/11/2012  . Alcohol abuse 04/26/2012  . Allergy, unspecified not elsewhere classified 04/28/2012  . Atrial fibrillation (HCC) 04/28/2012  . BPH (benign prostatic hyperplasia)   . CAD (coronary artery disease)    s/p CABG 2005  . Cardiac pacemaker in situ 04/28/2012  . CHF (congestive heart failure) (HCC) 04/23/2012  . Chronic airway obstruction, not elsewhere classified 04/26/2012  . Congestive heart failure, unspecified 04/28/2012  . Coronary atherosclerosis of native coronary artery 04/28/2012  . Degenerative joint disease 04/21/2012  . Depression    mild per pt  . Edema 04/28/2012  . Essential hypertension 10/17/2012  . Fall 04/21/2012   Prolonged downtime 04/20/2012   . GERD (gastroesophageal reflux disease) 09/13/2012  . Hip pain, right 09/02/2013  . History of rhabdomyolysis   . HTN (hypertension)   . Hyperlipidemia   . Hypertrophy of prostate without urinary obstruction and other lower urinary tract symptoms (LUTS) 04/28/2012  . Ingrowing nail 06/11/2012  . Insomnia 11/15/2012  . Insomnia, unspecified 04/28/2012  . Lumbago   . Macular degeneration   . Major depressive disorder, single episode, unspecified 04/28/2012  . Muscle weakness (generalized) 04/26/2012  . NSTEMI (non-ST elevated myocardial infarction) (HCC) 04/23/2012   Related to the stress of rhabdomyolysis and prolonged downtime prior to admission in  a patient with known coronary artery disease   . Osteoarthritis   . Other specified disease of white blood cells 04/26/2012  . Pacemaker 04/21/2012   Near EOL.   . Paroxysmal atrial tachycardia (HCC) 04/21/2012   Brief episodes. Not felt to be an anticoagulation candidate  because of age and frailty   . Pericarditis 04/21/2012  . Personal history of fall 04/28/2012  . Reflux esophagitis 04/28/2012  . Rhabdomyolysis   . Seborrheic eczema 12/03/2012  . Sick sinus syndrome (HCC) 10/14/2003; 10/29/2012   MDT EnPulse implanted by Dr Amil Amen for SSS; generator change 10/29/2012 by Dr Johney Frame MDT Jana Half pacemaker  . Sporotrichosis    remote  . Synovial cyst of wrist 11/10/2014   Left wrist    . Tachy-brady syndrome (HCC) 04/21/2012   Pacemaker is near EOL   . Unspecified constipation 09/13/2012  . Unspecified glaucoma(365.9) 04/28/2012   Past Surgical History:  Procedure Laterality Date  . ANGIOPLASTY  9/11/190   Dr. Katrinka Blazing  . APPENDECTOMY    . BACK SURGERY  03/11/2001  . bilateral knee arthroplasty  09/28/83; 05/17/1992   right then left  . CARPAL TUNNEL RELEASE Left   . CARPAL TUNNEL RELEASE Right 2007  . CATARACT EXTRACTION Right 10/30/1996  . CORONARY ARTERY BYPASS GRAFT  2005  . CYSTOSCOPY  08/14/85  . ELBOW SURGERY     repair  . EPIDIDYMIS SURGERY  09/17/1985  . HERNIA REPAIR Right 05/19/1976  . PACEMAKER GENERATOR CHANGE  10/29/12   Dr. Johney Frame  . PACEMAKER GENERATOR CHANGE N/A 10/29/2012   Procedure: PACEMAKER GENERATOR CHANGE;  Surgeon: Hillis Range, MD;  Location: [facility] CATH LAB;  Service: Cardiovascular;  Laterality: N/A;  . PACEMAKER INSERTION  10/14/2003;10/29/2012   MDT Implanted by Dr Amil Amen for SSS; generator change 10/29/2012 by Dr Johney Frame Medtronic Dante  . REPLACEMENT TOTAL KNEE BILATERAL  1992 and 1996  . ROTATOR CUFF REPAIR Right 2008  . SHOULDER OPEN ROTATOR CUFF REPAIR Left 02/07/1996  . SIGMOIDOSCOPY  08/26/2001  . TOE SURGERY Right 2005   correction  . TRANSURETHRAL RESECTION OF PROSTATE  07/27/1983  . YAG LASER APPLICATION Right 12/26/1973   eye  . YAG LASER APPLICATION Right 04/30/2001  . YAG LASER APPLICATION Left 05/07/2001    Allergies  Allergen Reactions  . Aleve [Naproxen Sodium]   . Celebrex [Celecoxib]     Lips swelled  . Sulfur     Lips  swelled  . Vioxx [Rofecoxib] Other (See Comments)    unknown    Outpatient Encounter Prescriptions as of 02/05/2017  Medication Sig  . acetaminophen (TYLENOL) 325 MG tablet Take 650 mg by mouth every 4 (four) hours as needed for mild pain.  Marland Kitchen amoxicillin (AMOXIL) 500 MG tablet Take 500 mg by mouth. Take one tablet one hour prior to surgery  . aspirin 81 MG tablet Take 81 mg by mouth daily.  . bimatoprost (LUMIGAN) 0.01 % SOLN Place 1 drop into both eyes at bedtime.   . carbamide peroxide (DEBROX) 6.5 % otic solution 5 drops into affected ear for 3 nights as needed for ear wax at bedtime  . diclofenac sodium (VOLTAREN) 1 % GEL Apply four times a day as needed for pain  . finasteride (PROSCAR) 5 MG tablet Take one tablet by mouth once daily  . fluticasone (FLONASE) 50 MCG/ACT nasal spray Place 1 spray into both nostrils daily.   . furosemide (LASIX) 20 MG tablet Take one tablet by mouth once daily. One twice weekly as needed for increased weight  2-3 lbs  . guaiFENesin (MUCINEX) 600 MG 12 hr tablet Take 600 mg by mouth 2 (two) times daily.   Marland Kitchen guaiFENesin-codeine (ROBAFEN AC) 100-10 MG/5ML syrup Take 10 mLs by mouth 4 (four) times daily as needed for cough.  Marland Kitchen HYDROCORTISONE EX Apply 1 application topically 2 (two) times daily as needed. For itching  . KLOR-CON M10 10 MEQ tablet Take 1 tablet by mouth twice daily  as needed with Lasix  . lidocaine (ASPERCREME W/LIDOCAINE) 4 % cream Apply morning and evening to painful area of the lower back  . lisinopril (PRINIVIL,ZESTRIL) 20 MG tablet Take one tablet daily for blood pressure  . loratadine (CLARITIN) 10 MG tablet Take 10 mg by mouth daily. Reported on 07/13/2015  . methadone (DOLOPHINE) 5 MG tablet Take one tablet by mouth every morning for pain; Take One and Half tablet (7.5mg ) by mouth every evening for pain.  . metoprolol tartrate (LOPRESSOR) 25 MG tablet Take 25 mg by mouth 2 (two) times daily.  . Multiple Vitamins-Minerals (ICAPS PO) Take  by mouth. Take one daily  . naproxen sodium (ANAPROX) 220 MG tablet Take 220 mg by mouth 2 (two) times daily.  . nitroGLYCERIN (NITROSTAT) 0.4 MG SL tablet Place 0.4 mg under the tongue every 5 (five) minutes as needed. Reported on 07/13/2015  . nystatin (MYCOSTATIN/NYSTOP) powder Apply 1 g topically 2 (two) times daily.  Marland Kitchen omeprazole (PRILOSEC) 20 MG capsule Take 20 mg by mouth daily.  Bertram Gala Glycol-Propyl Glycol 0.4-0.3 % SOLN Apply to eye. One drop both eyes twice daily  . polyethylene glycol (MIRALAX / GLYCOLAX) packet Take 17 g by mouth daily.  Marland Kitchen senna-docusate (SENOKOT-S) 8.6-50 MG tablet Take 1 tablet by mouth. Take one tablet at bedtime  . sertraline (ZOLOFT) 50 MG tablet Take 50 mg by mouth. Take one tablet at bedtime  . Silver (ALLEVYN AG GENTLE) 4"X4" PADS Apply topically daily as needed (Replace as needed). Silver-sulfadiaz-foam bandage)  . sodium chloride (OCEAN) 0.65 % SOLN nasal spray 1 spray. Spray three times a day in both nostrils  . Tamsulosin HCl (FLOMAX) 0.4 MG CAPS Take 0.4 mg by mouth daily.  Marland Kitchen zolpidem (AMBIEN) 5 MG tablet Take one tablet by mouth at bedtime for rest   No facility-administered encounter medications on file as of 02/05/2017.     Review of Systems  Constitutional: Negative for activity change, appetite change, chills, fatigue and fever.  Respiratory: Negative for cough, chest tightness, shortness of breath and wheezing.   Cardiovascular: Positive for leg swelling. Negative for chest pain and palpitations.  Gastrointestinal: Negative for abdominal pain, constipation, diarrhea, nausea and vomiting.  Genitourinary: Negative for dysuria, flank pain and urgency.  Musculoskeletal: Positive for arthralgias, back pain and gait problem.  Skin: Negative for color change, pallor and rash.       Left hip bruise   Neurological: Negative for dizziness, seizures, syncope, light-headedness and headaches.  Psychiatric/Behavioral: Negative for agitation and  confusion. The patient is not nervous/anxious.     Immunization History  Administered Date(s) Administered  . Influenza Whole 02/11/2013  . Influenza-Unspecified 02/18/2009, 02/14/2012, 01/29/2014, 01/28/2015, 02/03/2016, 01/31/2017  . PPD Test 12/18/2000, 04/25/2012, 05/09/2012  . Pneumococcal Conjugate-13 07/31/2003  . Pneumococcal Polysaccharide-23 04/24/2009  . Tdap 04/21/2012  . Zoster 07/31/2006, 04/25/2007   Pertinent  Health Maintenance Due  Topic Date Due  . INFLUENZA VACCINE  11/22/2016  . PNA vac Low Risk Adult  Completed   Fall Risk  12/11/2016 12/14/2015 07/13/2015 12/15/2014 08/04/2014  Falls in the  past year? No Yes No No No  Comment - 12/11/15 - - -  Number falls in past yr: - 1 - - -  Injury with Fall? - No - - -  Risk for fall due to : - - - - History of fall(s);Impaired balance/gait    Vitals:   02/05/17 1006  BP: (!) 178/82  Pulse: (!) 59  Resp: 18  Temp: 98.1 F (36.7 C)  SpO2: 96%  Weight: 180 lb 6.4 oz (81.8 kg)  Height:  (1.727 m)   Body mass index is 27.43 kg/m. Physical Exam  Constitutional: He is oriented to person, place, and time. He appears well-developed and well-nourished. No distress.  elderly   HENT:  Head: Normocephalic.  Mouth/Throat: Oropharynx is clear and moist. No oropharyngeal exudate.  Eyes: Pupils are equal, round, and reactive to light. Conjunctivae and EOM are normal. No scleral icterus.  Neck: Normal range of motion. No JVD present. No thyromegaly present.  Cardiovascular: Normal rate, regular rhythm, normal heart sounds and intact distal pulses.  Exam reveals no gallop and no friction rub.   No murmur heard. Pulmonary/Chest: Effort normal and breath sounds normal. No respiratory distress. He has no wheezes. He has no rales.  Abdominal: Soft. Bowel sounds are normal. He exhibits no distension. There is no tenderness. There is no rebound and no guarding.  Musculoskeletal:  Moves x 4 extremities except left hip limited due  to pain.Left hip/sacral area tender to palpation. Bilateral lower extremities compression hose in place.   Lymphadenopathy:    He has no cervical adenopathy.  Neurological: He is oriented to person, place, and time. Coordination normal.  Skin: Skin is warm and dry. No rash noted. No pallor.  Left hip purple bruise noted.   Psychiatric: He has a normal mood and affect.   Labs reviewed:  Recent Labs  05/22/16 06/22/16 09/14/16  NA 137 137 136*  K 4.6 4.7 4.7  BUN 40* 37* 37*  CREATININE 1.5* 1.3 1.5*    Recent Labs  05/04/16 05/11/16 09/14/16  AST  --  18 15  ALT 10  --  13  ALKPHOS  --  62 69    Recent Labs  05/11/16 09/14/16  WBC 6.2 5.4  HGB 12.1* 11.7*  HCT  --  35*  PLT 147* 138*   Lab Results  Component Value Date   TSH 3.55 09/14/2016   Lab Results  Component Value Date   HGBA1C 6.1 (H) 04/22/2012   Lab Results  Component Value Date   CHOL 149 04/22/2012   HDL 35 (L) 04/22/2012   LDLCALC 96 04/22/2012   TRIG 89 04/22/2012   CHOLHDL 4.3 04/22/2012    Significant Diagnostic Results in last 30 days:  No results found.  Assessment/Plan 1. Left hip pain Status post fall.Purple bruise noted. Hip tender/sacral area tender to palpation. Start Extra strength Tylenol 500 mg tablet every 8 hours for pain X 14 days. Portable Pelvic/femur and lumbar spine X-ray rule out fracture.   2. Fall at nursing home, initial encounter Larey Seat in the dinning room while trying to pickup Napkin. Fall and safety precaution discussed. Extra strength tylenol as above. X-ray as above.   3. Unsteady gait Uses Front wheel walker.PT/OT to evaluate and treat as indicated for gait training, exercise and muscle strengthening.  4. HTN SBP elevated this visit though suspect possible due to pain post falling. B/p reviewed B/p readings in the100's/60's-130's/60's.continue to monitor.     Family/ staff Communication: Reviewed plan  of care with patient and facility Nurse  supervisor  Labs/tests ordered: Portable Pelvic/femur and lumbar spine X-ray 2-3 views rule out fracture.   Caesar Bookman, NP

## 2017-02-07 DIAGNOSIS — I471 Supraventricular tachycardia: Secondary | ICD-10-CM | POA: Diagnosis not present

## 2017-02-07 DIAGNOSIS — I309 Acute pericarditis, unspecified: Secondary | ICD-10-CM | POA: Diagnosis not present

## 2017-02-07 DIAGNOSIS — Z950 Presence of cardiac pacemaker: Secondary | ICD-10-CM | POA: Diagnosis not present

## 2017-02-07 DIAGNOSIS — M6281 Muscle weakness (generalized): Secondary | ICD-10-CM | POA: Diagnosis not present

## 2017-02-07 DIAGNOSIS — R2681 Unsteadiness on feet: Secondary | ICD-10-CM | POA: Diagnosis not present

## 2017-02-07 DIAGNOSIS — M1990 Unspecified osteoarthritis, unspecified site: Secondary | ICD-10-CM | POA: Diagnosis not present

## 2017-02-07 DIAGNOSIS — I10 Essential (primary) hypertension: Secondary | ICD-10-CM | POA: Diagnosis not present

## 2017-02-07 DIAGNOSIS — I213 ST elevation (STEMI) myocardial infarction of unspecified site: Secondary | ICD-10-CM | POA: Diagnosis not present

## 2017-02-07 DIAGNOSIS — N400 Enlarged prostate without lower urinary tract symptoms: Secondary | ICD-10-CM | POA: Diagnosis not present

## 2017-02-07 DIAGNOSIS — R030 Elevated blood-pressure reading, without diagnosis of hypertension: Secondary | ICD-10-CM | POA: Diagnosis not present

## 2017-02-07 DIAGNOSIS — K219 Gastro-esophageal reflux disease without esophagitis: Secondary | ICD-10-CM | POA: Diagnosis not present

## 2017-02-07 DIAGNOSIS — Q245 Malformation of coronary vessels: Secondary | ICD-10-CM | POA: Diagnosis not present

## 2017-02-07 DIAGNOSIS — I509 Heart failure, unspecified: Secondary | ICD-10-CM | POA: Diagnosis not present

## 2017-02-07 DIAGNOSIS — S43429A Sprain of unspecified rotator cuff capsule, initial encounter: Secondary | ICD-10-CM | POA: Diagnosis not present

## 2017-02-07 DIAGNOSIS — I2510 Atherosclerotic heart disease of native coronary artery without angina pectoris: Secondary | ICD-10-CM | POA: Diagnosis not present

## 2017-02-07 DIAGNOSIS — K589 Irritable bowel syndrome without diarrhea: Secondary | ICD-10-CM | POA: Diagnosis not present

## 2017-02-07 DIAGNOSIS — H3530 Unspecified macular degeneration: Secondary | ICD-10-CM | POA: Diagnosis not present

## 2017-02-07 DIAGNOSIS — Z9181 History of falling: Secondary | ICD-10-CM | POA: Diagnosis not present

## 2017-02-07 DIAGNOSIS — R05 Cough: Secondary | ICD-10-CM | POA: Diagnosis not present

## 2017-02-07 DIAGNOSIS — R29898 Other symptoms and signs involving the musculoskeletal system: Secondary | ICD-10-CM | POA: Diagnosis not present

## 2017-02-08 DIAGNOSIS — R29898 Other symptoms and signs involving the musculoskeletal system: Secondary | ICD-10-CM | POA: Diagnosis not present

## 2017-02-08 DIAGNOSIS — I213 ST elevation (STEMI) myocardial infarction of unspecified site: Secondary | ICD-10-CM | POA: Diagnosis not present

## 2017-02-08 DIAGNOSIS — M6281 Muscle weakness (generalized): Secondary | ICD-10-CM | POA: Diagnosis not present

## 2017-02-08 DIAGNOSIS — R2681 Unsteadiness on feet: Secondary | ICD-10-CM | POA: Diagnosis not present

## 2017-02-08 DIAGNOSIS — I10 Essential (primary) hypertension: Secondary | ICD-10-CM | POA: Diagnosis not present

## 2017-02-08 DIAGNOSIS — H3530 Unspecified macular degeneration: Secondary | ICD-10-CM | POA: Diagnosis not present

## 2017-02-09 DIAGNOSIS — H3530 Unspecified macular degeneration: Secondary | ICD-10-CM | POA: Diagnosis not present

## 2017-02-09 DIAGNOSIS — R2681 Unsteadiness on feet: Secondary | ICD-10-CM | POA: Diagnosis not present

## 2017-02-09 DIAGNOSIS — I213 ST elevation (STEMI) myocardial infarction of unspecified site: Secondary | ICD-10-CM | POA: Diagnosis not present

## 2017-02-09 DIAGNOSIS — R29898 Other symptoms and signs involving the musculoskeletal system: Secondary | ICD-10-CM | POA: Diagnosis not present

## 2017-02-09 DIAGNOSIS — I10 Essential (primary) hypertension: Secondary | ICD-10-CM | POA: Diagnosis not present

## 2017-02-09 DIAGNOSIS — M6281 Muscle weakness (generalized): Secondary | ICD-10-CM | POA: Diagnosis not present

## 2017-02-13 DIAGNOSIS — M6281 Muscle weakness (generalized): Secondary | ICD-10-CM | POA: Diagnosis not present

## 2017-02-13 DIAGNOSIS — I10 Essential (primary) hypertension: Secondary | ICD-10-CM | POA: Diagnosis not present

## 2017-02-13 DIAGNOSIS — H3530 Unspecified macular degeneration: Secondary | ICD-10-CM | POA: Diagnosis not present

## 2017-02-13 DIAGNOSIS — I213 ST elevation (STEMI) myocardial infarction of unspecified site: Secondary | ICD-10-CM | POA: Diagnosis not present

## 2017-02-13 DIAGNOSIS — R29898 Other symptoms and signs involving the musculoskeletal system: Secondary | ICD-10-CM | POA: Diagnosis not present

## 2017-02-13 DIAGNOSIS — R2681 Unsteadiness on feet: Secondary | ICD-10-CM | POA: Diagnosis not present

## 2017-02-15 DIAGNOSIS — I213 ST elevation (STEMI) myocardial infarction of unspecified site: Secondary | ICD-10-CM | POA: Diagnosis not present

## 2017-02-15 DIAGNOSIS — I10 Essential (primary) hypertension: Secondary | ICD-10-CM | POA: Diagnosis not present

## 2017-02-15 DIAGNOSIS — R29898 Other symptoms and signs involving the musculoskeletal system: Secondary | ICD-10-CM | POA: Diagnosis not present

## 2017-02-15 DIAGNOSIS — M6281 Muscle weakness (generalized): Secondary | ICD-10-CM | POA: Diagnosis not present

## 2017-02-15 DIAGNOSIS — H3530 Unspecified macular degeneration: Secondary | ICD-10-CM | POA: Diagnosis not present

## 2017-02-15 DIAGNOSIS — R2681 Unsteadiness on feet: Secondary | ICD-10-CM | POA: Diagnosis not present

## 2017-02-16 DIAGNOSIS — R29898 Other symptoms and signs involving the musculoskeletal system: Secondary | ICD-10-CM | POA: Diagnosis not present

## 2017-02-16 DIAGNOSIS — R2681 Unsteadiness on feet: Secondary | ICD-10-CM | POA: Diagnosis not present

## 2017-02-16 DIAGNOSIS — H3530 Unspecified macular degeneration: Secondary | ICD-10-CM | POA: Diagnosis not present

## 2017-02-16 DIAGNOSIS — I10 Essential (primary) hypertension: Secondary | ICD-10-CM | POA: Diagnosis not present

## 2017-02-16 DIAGNOSIS — I213 ST elevation (STEMI) myocardial infarction of unspecified site: Secondary | ICD-10-CM | POA: Diagnosis not present

## 2017-02-16 DIAGNOSIS — M6281 Muscle weakness (generalized): Secondary | ICD-10-CM | POA: Diagnosis not present

## 2017-02-19 DIAGNOSIS — R29898 Other symptoms and signs involving the musculoskeletal system: Secondary | ICD-10-CM | POA: Diagnosis not present

## 2017-02-19 DIAGNOSIS — H3530 Unspecified macular degeneration: Secondary | ICD-10-CM | POA: Diagnosis not present

## 2017-02-19 DIAGNOSIS — I10 Essential (primary) hypertension: Secondary | ICD-10-CM | POA: Diagnosis not present

## 2017-02-19 DIAGNOSIS — M6281 Muscle weakness (generalized): Secondary | ICD-10-CM | POA: Diagnosis not present

## 2017-02-19 DIAGNOSIS — I213 ST elevation (STEMI) myocardial infarction of unspecified site: Secondary | ICD-10-CM | POA: Diagnosis not present

## 2017-02-19 DIAGNOSIS — R2681 Unsteadiness on feet: Secondary | ICD-10-CM | POA: Diagnosis not present

## 2017-02-21 DIAGNOSIS — I213 ST elevation (STEMI) myocardial infarction of unspecified site: Secondary | ICD-10-CM | POA: Diagnosis not present

## 2017-02-21 DIAGNOSIS — H3530 Unspecified macular degeneration: Secondary | ICD-10-CM | POA: Diagnosis not present

## 2017-02-21 DIAGNOSIS — R2681 Unsteadiness on feet: Secondary | ICD-10-CM | POA: Diagnosis not present

## 2017-02-21 DIAGNOSIS — M6281 Muscle weakness (generalized): Secondary | ICD-10-CM | POA: Diagnosis not present

## 2017-02-21 DIAGNOSIS — I10 Essential (primary) hypertension: Secondary | ICD-10-CM | POA: Diagnosis not present

## 2017-02-21 DIAGNOSIS — R29898 Other symptoms and signs involving the musculoskeletal system: Secondary | ICD-10-CM | POA: Diagnosis not present

## 2017-03-09 ENCOUNTER — Other Ambulatory Visit: Payer: Self-pay | Admitting: *Deleted

## 2017-03-09 MED ORDER — ZOLPIDEM TARTRATE 5 MG PO TABS: ORAL_TABLET | ORAL | 0 refills | Status: DC

## 2017-03-09 NOTE — Telephone Encounter (Signed)
Written Rx given to nursing staff at [facility].  

## 2017-03-19 ENCOUNTER — Non-Acute Institutional Stay: Payer: Medicare Other | Admitting: Internal Medicine

## 2017-03-19 ENCOUNTER — Encounter: Payer: Self-pay | Admitting: Internal Medicine

## 2017-03-19 DIAGNOSIS — I110 Hypertensive heart disease with heart failure: Secondary | ICD-10-CM | POA: Diagnosis not present

## 2017-03-19 DIAGNOSIS — F5101 Primary insomnia: Secondary | ICD-10-CM

## 2017-03-19 DIAGNOSIS — F341 Dysthymic disorder: Secondary | ICD-10-CM | POA: Diagnosis not present

## 2017-03-19 DIAGNOSIS — I209 Angina pectoris, unspecified: Secondary | ICD-10-CM

## 2017-03-19 DIAGNOSIS — R350 Frequency of micturition: Secondary | ICD-10-CM | POA: Diagnosis not present

## 2017-03-19 DIAGNOSIS — G8929 Other chronic pain: Secondary | ICD-10-CM

## 2017-03-19 DIAGNOSIS — F329 Major depressive disorder, single episode, unspecified: Secondary | ICD-10-CM

## 2017-03-19 DIAGNOSIS — N401 Enlarged prostate with lower urinary tract symptoms: Secondary | ICD-10-CM | POA: Diagnosis not present

## 2017-03-19 DIAGNOSIS — M545 Low back pain, unspecified: Secondary | ICD-10-CM

## 2017-03-19 DIAGNOSIS — I25119 Atherosclerotic heart disease of native coronary artery with unspecified angina pectoris: Secondary | ICD-10-CM | POA: Diagnosis not present

## 2017-03-19 NOTE — Progress Notes (Addendum)
Location:  [facility] Room Number: 4 Place of Service:  ALF [phone]) Provider:  Oneal Grout MD  Oneal Grout, MD  Patient Care Team: Oneal Grout, MD as PCP - General (Internal Medicine) Shelva Majestic, [facility], Man X, NP as Nurse Practitioner (Nurse Practitioner) Lyn Records, MD as Consulting Physician (Cardiology) Barron Alvine, MD as Consulting Physician (Urology) Francisca December., MD as Attending Physician (Cardiology) Leta Speller, MD as Consulting Physician (Dermatology) Elise Benne, MD as Consulting Physician (Ophthalmology)  Extended Emergency Contact Information Primary Emergency Contact: Mortimore,Janet Address: [address]          [place], [place] [address] [place] of [place] Home Phone: [phone] Mobile Phone: [phone] Relation: Daughter  Code Status:  DNR  Goals of care: Advanced Directive information Advanced Directives 03/19/2017  Does Patient Have a Medical Advance Directive? Yes  Type of Estate agent of [place];Living will;Out of facility DNR (pink MOST or yellow form)  Does patient want to make changes to medical advance directive? No - Patient declined  Copy of Healthcare Power of Attorney in Chart? Yes  Pre-existing out of facility DNR order (yellow form or pink MOST form) Yellow form placed in chart (order not valid for inpatient use);Pink MOST form placed in chart (order not valid for inpatient use)     Chief Complaint  Patient presents with  . Medical Management of Chronic Issues    Routine Visit     HPI:  Pt is a 83 y.o. male seen today for medical management of chronic diseases. He denies any concern this visit. No new concern from nursing. ehe resides in assited living. He ambulates with a walker for short distance and wheelchair for longer distance. He had a fall on 10/14 and is currently working with PT and OT. Mood has been stable. BP reading stable on review. No  recent labs for review. Has been sleeping well.    Past Medical History:  Diagnosis Date  . Abnormality of gait 06/11/2012  . Alcohol abuse 04/26/2012  . Allergy, unspecified not elsewhere classified 04/28/2012  . Atrial fibrillation (HCC) 04/28/2012  . BPH (benign prostatic hyperplasia)   . CAD (coronary artery disease)    s/p CABG 2005  . Cardiac pacemaker in situ 04/28/2012  . CHF (congestive heart failure) (HCC) 04/23/2012  . Chronic airway obstruction, not elsewhere classified 04/26/2012  . Congestive heart failure, unspecified 04/28/2012  . Coronary atherosclerosis of native coronary artery 04/28/2012  . Degenerative joint disease 04/21/2012  . Depression    mild per pt  . Edema 04/28/2012  . Essential hypertension 10/17/2012  . Fall 04/21/2012   Prolonged downtime 04/20/2012   . GERD (gastroesophageal reflux disease) 09/13/2012  . Hip pain, right 09/02/2013  . History of rhabdomyolysis   . HTN (hypertension)   . Hyperlipidemia   . Hypertrophy of prostate without urinary obstruction and other lower urinary tract symptoms (LUTS) 04/28/2012  . Ingrowing nail 06/11/2012  . Insomnia 11/15/2012  . Insomnia, unspecified 04/28/2012  . Lumbago   . Macular degeneration   . Major depressive disorder, single episode, unspecified 04/28/2012  . Muscle weakness (generalized) 04/26/2012  . NSTEMI (non-ST elevated myocardial infarction) (HCC) 04/23/2012   Related to the stress of rhabdomyolysis and prolonged downtime prior to admission in a patient with known coronary artery disease   . Osteoarthritis   . Other specified disease of white blood cells 04/26/2012  . Pacemaker 04/21/2012   Near EOL.   . Paroxysmal atrial tachycardia (  HCC) 04/21/2012   Brief episodes. Not felt to be an anticoagulation candidate because of age and frailty   . Pericarditis 04/21/2012  . Personal history of fall 04/28/2012  . Reflux esophagitis 04/28/2012  . Rhabdomyolysis   . Seborrheic eczema 12/03/2012  . Sick sinus syndrome (HCC)  10/14/2003; 10/29/2012   MDT EnPulse implanted by Dr Amil AmenEdmunds for SSS; generator change 10/29/2012 by Dr Johney FrameAllred MDT Jana HalfSensia pacemaker  . Sporotrichosis    remote  . Synovial cyst of wrist 11/10/2014   Left wrist    . Tachy-brady syndrome (HCC) 04/21/2012   Pacemaker is near EOL   . Unspecified constipation 09/13/2012  . Unspecified glaucoma(365.9) 04/28/2012   Past Surgical History:  Procedure Laterality Date  . ANGIOPLASTY  9/11/190   Dr. Katrinka BlazingSmith  . APPENDECTOMY    . BACK SURGERY  03/11/2001  . bilateral knee arthroplasty  09/28/83; 05/17/1992   right then left  . CARPAL TUNNEL RELEASE Left   . CARPAL TUNNEL RELEASE Right 2007  . CATARACT EXTRACTION Right 10/30/1996  . CORONARY ARTERY BYPASS GRAFT  2005  . CYSTOSCOPY  08/14/85  . ELBOW SURGERY     repair  . EPIDIDYMIS SURGERY  09/17/1985  . HERNIA REPAIR Right 05/19/1976  . PACEMAKER GENERATOR CHANGE  10/29/12   Dr. Johney FrameAllred  . PACEMAKER GENERATOR CHANGE N/A 10/29/2012   Procedure: PACEMAKER GENERATOR CHANGE;  Surgeon: Hillis RangeJames Allred, MD;  [facility];  Service: Cardiovascular;  Laterality: N/A;  . PACEMAKER INSERTION  10/14/2003;10/29/2012   MDT Implanted by Dr Amil AmenEdmunds for SSS; generator change 10/29/2012 by Dr Johney FrameAllred Medtronic SonoitaSensia  . REPLACEMENT TOTAL KNEE BILATERAL  1992 and 1996  . ROTATOR CUFF REPAIR Right 2008  . SHOULDER OPEN ROTATOR CUFF REPAIR Left 02/07/1996  . SIGMOIDOSCOPY  08/26/2001  . TOE SURGERY Right 2005   correction  . TRANSURETHRAL RESECTION OF PROSTATE  07/27/1983  . YAG LASER APPLICATION Right 12/26/1973   eye  . YAG LASER APPLICATION Right 04/30/2001  . YAG LASER APPLICATION Left 05/07/2001    Allergies  Allergen Reactions  . Aleve [Naproxen Sodium]   . Celebrex [Celecoxib]     Lips swelled  . Sulfur     Lips swelled  . Vioxx [Rofecoxib] Other (See Comments)    unknown    Outpatient Encounter Medications as of 03/19/2017  Medication Sig  . acetaminophen (TYLENOL) 325 MG tablet Take 650 mg by mouth every 4 (four)  hours as needed.  Marland Kitchen. amoxicillin (AMOXIL) 500 MG tablet Take 500 mg by mouth. Take one tablet one hour prior to surgery  . aspirin 81 MG tablet Take 81 mg by mouth daily.  . bimatoprost (LUMIGAN) 0.01 % SOLN Place 1 drop into both eyes at bedtime.   . carbamide peroxide (DEBROX) 6.5 % otic solution 5 drops into affected ear for 3 nights as needed for ear wax at bedtime  . diclofenac sodium (VOLTAREN) 1 % GEL Apply four times a day as needed for pain  . finasteride (PROSCAR) 5 MG tablet Take one tablet by mouth once daily  . fluticasone (FLONASE) 50 MCG/ACT nasal spray Place 1 spray into both nostrils daily.   . furosemide (LASIX) 20 MG tablet Take one tablet by mouth once daily. One twice weekly as needed for increased weight 2-3 lbs  . guaiFENesin (MUCINEX) 600 MG 12 hr tablet Take 600 mg by mouth 2 (two) times daily.   Marland Kitchen. guaiFENesin-codeine (ROBAFEN AC) 100-10 MG/5ML syrup Take 10 mLs by mouth every 6 (six) hours as  needed for cough.   Marland Kitchen. HYDROCORTISONE EX Apply 1 application topically 2 (two) times daily as needed. For itching  . KLOR-CON M10 10 MEQ tablet Take 1 tablet by mouth twice daily  as needed with Lasix  . lidocaine (ASPERCREME W/LIDOCAINE) 4 % cream Apply morning and evening to painful area of the lower back  . lisinopril (PRINIVIL,ZESTRIL) 20 MG tablet Take one tablet daily for blood pressure  . loratadine (CLARITIN) 10 MG tablet Take 10 mg by mouth daily. Reported on 07/13/2015  . methadone (DOLOPHINE) 5 MG tablet Take one tablet by mouth every morning for pain; Take One and Half tablet (7.5mg ) by mouth every evening for pain.  . metoprolol tartrate (LOPRESSOR) 25 MG tablet Take 25 mg by mouth 2 (two) times daily.  . Multiple Vitamins-Minerals (ICAPS PO) Take 1 tablet by mouth daily.   . naproxen sodium (ANAPROX) 220 MG tablet Take 220 mg by mouth 2 (two) times daily.  . nitroGLYCERIN (NITROSTAT) 0.4 MG SL tablet Place 0.4 mg under the tongue every 5 (five) minutes as needed. Reported  on 07/13/2015  . omeprazole (PRILOSEC) 20 MG capsule Take 20 mg by mouth daily.  Bertram Gala. Polyethyl Glycol-Propyl Glycol 0.4-0.3 % SOLN Apply 1 drop to eye 2 (two) times daily. Both eyes  . polyethylene glycol (MIRALAX / GLYCOLAX) packet Take 17 g by mouth daily. Can also take daily as needed at in addition to the scheduled dose  . senna-docusate (SENOKOT-S) 8.6-50 MG tablet Take 1 tablet by mouth at bedtime.   . sertraline (ZOLOFT) 50 MG tablet Take 50 mg by mouth. Take one tablet at bedtime  . Silver (ALLEVYN AG GENTLE) 4"X4" PADS Apply topically daily as needed (Replace as needed). Silver-sulfadiaz-foam bandage)  . sodium chloride (OCEAN) 0.65 % SOLN nasal spray Place 1 spray into both nostrils 3 (three) times daily.   . Tamsulosin HCl (FLOMAX) 0.4 MG CAPS Take 0.4 mg by mouth daily.  Marland Kitchen. zolpidem (AMBIEN) 5 MG tablet Take one tablet by mouth at bedtime for rest  . [DISCONTINUED] nystatin (MYCOSTATIN/NYSTOP) powder Apply 1 g topically 2 (two) times daily.   No facility-administered encounter medications on file as of 03/19/2017.     Review of Systems  Constitutional: Negative for appetite change, chills, diaphoresis and fever.  HENT: Positive for drooling, hearing loss and rhinorrhea. Negative for congestion, ear discharge, ear pain, mouth sores, sinus pressure, sinus pain, sore throat and trouble swallowing.   Eyes: Positive for visual disturbance. Negative for pain, discharge, redness and itching.  Respiratory: Positive for cough and shortness of breath. Negative for wheezing.        Occasional chronic cough. Dyspnea with exertion.   Cardiovascular: Negative for chest pain and palpitations.  Gastrointestinal: Negative for abdominal pain, blood in stool, constipation, diarrhea, nausea and vomiting.  Genitourinary: Positive for frequency. Negative for dysuria, flank pain and hematuria.       Has incontinence  Musculoskeletal: Positive for arthralgias and gait problem. Negative for back pain.    Skin: Positive for rash. Negative for wound.  Neurological: Negative for dizziness, seizures, syncope, numbness and headaches.  Hematological: Bruises/bleeds easily.  Psychiatric/Behavioral: Negative for behavioral problems, confusion and sleep disturbance. The patient is not nervous/anxious.     Immunization History  Administered Date(s) Administered  . Influenza Whole 02/11/2013  . Influenza-Unspecified 02/18/2009, 02/14/2012, 01/29/2014, 01/28/2015, 02/03/2016, 01/31/2017  . PPD Test 12/18/2000, 04/25/2012, 05/09/2012  . Pneumococcal Conjugate-13 07/31/2003  . Pneumococcal Polysaccharide-23 04/24/2009  . Tdap 04/21/2012  . Zoster 07/31/2006, 04/25/2007  Pertinent  Health Maintenance Due  Topic Date Due  . INFLUENZA VACCINE  Completed  . PNA vac Low Risk Adult  Completed   Fall Risk  12/11/2016 12/14/2015 07/13/2015 12/15/2014 08/04/2014  Falls in the past year? No Yes No No No  Comment - 12/11/15 - - -  Number falls in past yr: - 1 - - -  Injury with Fall? - No - - -  Risk for fall due to : - - - - History of fall(s);Impaired balance/gait   Functional Status Survey:    Vitals:   03/19/17 1330  BP: 114/63  Pulse: 60  Resp: 18  Temp: (!) 97.1 F (36.2 C)  TempSrc: Oral  SpO2: 91%  Weight: 176 lb 12.8 oz (80.2 kg)  Height: 5\' 8"  (1.727 m)   Body mass index is 26.88 kg/m.   Wt Readings from Last 3 Encounters:  03/19/17 176 lb 12.8 oz (80.2 kg)  02/05/17 180 lb 6.4 oz (81.8 kg)  01/30/17 180 lb (81.6 kg)   Physical Exam  Constitutional: He is oriented to person, place, and time. He appears well-developed and well-nourished. No distress.  HENT:  Head: Normocephalic and atraumatic.  Nose: Nose normal.  Mouth/Throat: Oropharynx is clear and moist. No oropharyngeal exudate.  Eyes: Conjunctivae and EOM are normal. Pupils are equal, round, and reactive to light. Right eye exhibits no discharge. Left eye exhibits no discharge.  Corrective glasses present.  Neck: Normal  range of motion. Neck supple.  Cardiovascular: Normal rate and regular rhythm.  Pulmonary/Chest: Effort normal and breath sounds normal. No respiratory distress. He has no wheezes. He has no rales. He exhibits no tenderness.  Pacemaker to left chest wall.   Abdominal: Soft. Bowel sounds are normal. He exhibits no distension. There is no guarding.  Musculoskeletal: He exhibits edema and deformity.  Able to move all 4 extremities, unsteady gait, uses walker and wheelchair, arthritis changes to his fingers. Ted hose to both legs. Feeds himself and goes to dining area for meals.   Lymphadenopathy:    He has no cervical adenopathy.  Neurological: He is alert and oriented to person, place, and time.  Skin: Skin is warm and dry. He is not diaphoretic.  Easy bruising  Psychiatric: He has a normal mood and affect. His behavior is normal.    Labs reviewed: Recent Labs    05/22/16 06/22/16 09/14/16  NA 137 137 136*  K 4.6 4.7 4.7  BUN 40* 37* 37*  CREATININE 1.5* 1.3 1.5*   Recent Labs    05/04/16 05/11/16 09/14/16  AST  --  18 15  ALT 10  --  13  ALKPHOS  --  62 69   Recent Labs    05/11/16 09/14/16  WBC 6.2 5.4  HGB 12.1* 11.7*  HCT  --  35*  PLT 147* 138*   Lab Results  Component Value Date   TSH 3.55 09/14/2016   Lab Results  Component Value Date   HGBA1C 6.1 (H) 04/22/2012   Lab Results  Component Value Date   CHOL 149 04/22/2012   HDL 35 (L) 04/22/2012   LDLCALC 96 04/22/2012   TRIG 89 04/22/2012   CHOLHDL 4.3 04/22/2012    Significant Diagnostic Results in last 30 days:  No results found.  Assessment/Plan  Hypertensive heart disease with heart failure Overall reading at goal. One reading of 177/67. Denies any symptom. Leg edema stable. Breathing overall stable. Continue lisinopril 20 mg daily, metoprolol tartrate 25 mg bid and lasix 20  mg daily. Check BMP. Continue baby aspirin.   BPH With UI. Continue finasteride 5 mg daily and tamsulosin.   Chronic back  pain With lumbar stenosis and arthritis changes. Currently on methadone 5 mg daily and naproxen 220 mg bid and monitor. Also on tylenol as needed. Tolerating it well. Monitor renal function with him being on naproxen. Continue lidocaine and voltaren gel. He does not want his naproxen changed. Check cbc and cmp with ckd.   CAD Chest pain free. Continue aspirin, b blocker, ACEI and prn NTG.   Insomnia Sleeps well. Continue zolpidem 5 mg daily.  Chronic depression Mood stable. Attempt GDR to 25 mg daily and monitor,   Family/ staff Communication: reviewed care plan with patient and charge nurse.    Labs/tests ordered:  Cbc, cmp   Oneal Grout, MD Internal Medicine [facility] [address] [place], [place] [address] Cell Phone (Monday-Friday 8 am - 5 pm): [phone] On Call: [phone] and follow prompts after 5 pm and on weekends Office Phone: [phone] Office Fax: [phone]

## 2017-03-22 ENCOUNTER — Encounter: Payer: Self-pay | Admitting: *Deleted

## 2017-03-22 DIAGNOSIS — I509 Heart failure, unspecified: Secondary | ICD-10-CM | POA: Diagnosis not present

## 2017-03-22 DIAGNOSIS — I10 Essential (primary) hypertension: Secondary | ICD-10-CM | POA: Diagnosis not present

## 2017-03-22 LAB — COMPLETE METABOLIC PANEL WITH GFR
ALBUMIN: 3.9
ALT: 11
ALT: 11
AST: 7
AST: 7
Albumin: 3.9
Alkaline Phosphatase: 67
Alkaline Phosphatase: 67
BILIRUBIN TOTAL: 0.6
BUN: 40 — AB (ref 4–21)
BUN: 40 — AB (ref 4–21)
CALCIUM: 9.4
CREATININE: 1.4
Calcium: 9.4
Creat: 1.4
GLUCOSE: 92
Glucose: 92
POTASSIUM: 4.5
Potassium: 4.5
SODIUM: 136
SODIUM: 136
TOTAL PROTEIN: 6.6 g/dL
TOTAL PROTEIN: 6.6 g/dL
Total Bilirubin: 0.6

## 2017-03-22 LAB — CBC
HCT: 34
HGB: 11.8
WBC: 5.8
platelet count: 130

## 2017-04-10 ENCOUNTER — Telehealth: Payer: Self-pay | Admitting: Cardiology

## 2017-04-10 ENCOUNTER — Encounter: Payer: Medicare Other | Admitting: *Deleted

## 2017-04-10 NOTE — Telephone Encounter (Signed)
Spoke with pt and reminded pt of remote transmission that is due today. Pt verbalized understanding.   

## 2017-04-12 ENCOUNTER — Encounter: Payer: Self-pay | Admitting: Cardiology

## 2017-04-13 DIAGNOSIS — M79672 Pain in left foot: Secondary | ICD-10-CM | POA: Diagnosis not present

## 2017-04-13 DIAGNOSIS — B351 Tinea unguium: Secondary | ICD-10-CM | POA: Diagnosis not present

## 2017-04-13 DIAGNOSIS — M79671 Pain in right foot: Secondary | ICD-10-CM | POA: Diagnosis not present

## 2017-04-16 DIAGNOSIS — E612 Magnesium deficiency: Secondary | ICD-10-CM | POA: Diagnosis not present

## 2017-04-16 DIAGNOSIS — D510 Vitamin B12 deficiency anemia due to intrinsic factor deficiency: Secondary | ICD-10-CM | POA: Diagnosis not present

## 2017-04-16 LAB — VITAMIN B12: VITAMIN B 12: 540

## 2017-04-20 ENCOUNTER — Telehealth: Payer: Self-pay | Admitting: *Deleted

## 2017-04-20 NOTE — Telephone Encounter (Signed)
Spoke with caregiver who reported difficulty sending a transmission and requested a new home monitor. I requested a support call from [facility] to reach out to the caregiver for troubleshooting support.

## 2017-04-20 NOTE — Telephone Encounter (Signed)
Eppie Gibsonarolyn Sams (RN) [facility],   1. Has your device fired?no  2. Is you device beeping? n/a  3. Are you experiencing draining or swelling at device site? n/a  4. Are you calling to see if we received your device transmission? yes  5. Have you passed out? N/a     Please route to Device Clinic Pool

## 2017-05-03 DIAGNOSIS — H35351 Cystoid macular degeneration, right eye: Secondary | ICD-10-CM | POA: Diagnosis not present

## 2017-05-03 DIAGNOSIS — H353212 Exudative age-related macular degeneration, right eye, with inactive choroidal neovascularization: Secondary | ICD-10-CM | POA: Diagnosis not present

## 2017-05-03 DIAGNOSIS — H35352 Cystoid macular degeneration, left eye: Secondary | ICD-10-CM | POA: Diagnosis not present

## 2017-05-03 DIAGNOSIS — H35053 Retinal neovascularization, unspecified, bilateral: Secondary | ICD-10-CM | POA: Diagnosis not present

## 2017-05-03 DIAGNOSIS — H353134 Nonexudative age-related macular degeneration, bilateral, advanced atrophic with subfoveal involvement: Secondary | ICD-10-CM | POA: Diagnosis not present

## 2017-05-10 ENCOUNTER — Ambulatory Visit (INDEPENDENT_AMBULATORY_CARE_PROVIDER_SITE_OTHER): Payer: Medicare Other | Admitting: *Deleted

## 2017-05-10 DIAGNOSIS — I495 Sick sinus syndrome: Secondary | ICD-10-CM | POA: Diagnosis not present

## 2017-05-10 NOTE — Progress Notes (Signed)
Remote pacemaker transmission.   

## 2017-05-11 ENCOUNTER — Encounter: Payer: Self-pay | Admitting: Cardiology

## 2017-05-30 LAB — CUP PACEART REMOTE DEVICE CHECK
Brady Statistic AP VS Percent: 0 %
Brady Statistic AS VS Percent: 0 %
Date Time Interrogation Session: 20190117151826
Implantable Lead Implant Date: 20050622
Implantable Lead Model: 5092
Implantable Pulse Generator Implant Date: 20140708
Lead Channel Impedance Value: 642 Ohm
Lead Channel Pacing Threshold Pulse Width: 0.4 ms
Lead Channel Pacing Threshold Pulse Width: 0.4 ms
Lead Channel Setting Pacing Amplitude: 2 V
MDC IDC LEAD IMPLANT DT: 20050622
MDC IDC LEAD LOCATION: 753859
MDC IDC LEAD LOCATION: 753860
MDC IDC MSMT BATTERY IMPEDANCE: 587 Ohm
MDC IDC MSMT BATTERY REMAINING LONGEVITY: 66 mo
MDC IDC MSMT BATTERY VOLTAGE: 2.78 V
MDC IDC MSMT LEADCHNL RA IMPEDANCE VALUE: 416 Ohm
MDC IDC MSMT LEADCHNL RA PACING THRESHOLD AMPLITUDE: 0.5 V
MDC IDC MSMT LEADCHNL RV PACING THRESHOLD AMPLITUDE: 0.5 V
MDC IDC SET LEADCHNL RV PACING AMPLITUDE: 2.5 V
MDC IDC SET LEADCHNL RV PACING PULSEWIDTH: 0.4 ms
MDC IDC SET LEADCHNL RV SENSING SENSITIVITY: 4 mV
MDC IDC STAT BRADY AP VP PERCENT: 98 %
MDC IDC STAT BRADY AS VP PERCENT: 2 %

## 2017-06-14 ENCOUNTER — Encounter: Payer: Self-pay | Admitting: Family

## 2017-06-14 ENCOUNTER — Non-Acute Institutional Stay: Payer: Medicare Other | Admitting: Family

## 2017-06-14 DIAGNOSIS — I509 Heart failure, unspecified: Secondary | ICD-10-CM | POA: Diagnosis not present

## 2017-06-14 DIAGNOSIS — I132 Hypertensive heart and chronic kidney disease with heart failure and with stage 5 chronic kidney disease, or end stage renal disease: Secondary | ICD-10-CM | POA: Diagnosis not present

## 2017-06-14 DIAGNOSIS — F341 Dysthymic disorder: Secondary | ICD-10-CM | POA: Diagnosis not present

## 2017-06-14 DIAGNOSIS — F329 Major depressive disorder, single episode, unspecified: Secondary | ICD-10-CM

## 2017-06-14 NOTE — Progress Notes (Signed)
Location:  [facility] Room Number: 4 Place of Service:  ALF [phone]) Provider: Kainoah Bartosiewicz FNP-C   Oneal Grout, MD  Patient Care Team: Oneal Grout, MD as PCP - General (Internal Medicine) Shelva Majestic, [facility], Man X, NP as Nurse Practitioner (Nurse Practitioner) Lyn Records, MD as Consulting Physician (Cardiology) Barron Alvine, MD as Consulting Physician (Urology) Francisca December., MD as Attending Physician (Cardiology) Leta Speller, MD as Consulting Physician (Dermatology) Elise Benne, MD as Consulting Physician (Ophthalmology)  Extended Emergency Contact Information Primary Emergency Contact: Mortimore,Janet Address: [address]          [place], [place] [address] [place] Home Phone: [phone] Mobile Phone: [phone] Relation: Daughter  Code Status:  DNR  Goals of care: Advanced Directive information Advanced Directives 06/14/2017  Does Patient Have a Medical Advance Directive? Yes  Type of Estate agent of [place];Out of facility DNR (pink MOST or yellow form);Living will  Does patient want to make changes to medical advance directive? -  Copy of Healthcare Power of Attorney in Chart? Yes  Pre-existing out of facility DNR order (yellow form or pink MOST form) Yellow form placed in chart (order not valid for inpatient use);Pink MOST form placed in chart (order not valid for inpatient use)     Chief Complaint  Patient presents with  . Medical Management of Chronic Issues    3 month routine visit    HPI:  Pt is a 84 y.o. Nash seen today [facility] for medical management of chronic diseases.He has a medical history of HTN,CAD,CHF,Depression,DDD,OA,BPH among other conditions.He is seen in his room today with facility Nurse present at bedside.he denies any acute issues during visit.He states continues to exercise in the mornings in his room. He walks to the dinning with walker though  states requires rest due to dyspnea with execision.No recent fall episodes or weight changes. He state feeling worried today because his daughter was supposed to go for surgery tomorrow but had to go to the ER. Family members have been updating him of the daughter's condition.He would like to speak to the daughter in the skilled care facility but doesn't have the number. ALF Nurse supervisor notified will ask family to provider number for the patient if possible.       Past Medical History:  Diagnosis Date  . Abnormality of gait 06/11/2012  . Alcohol abuse 04/26/2012  . Allergy, unspecified not elsewhere classified 04/28/2012  . Atrial fibrillation (HCC) 04/28/2012  . BPH (benign prostatic hyperplasia)   . CAD (coronary artery disease)    s/p CABG 2005  . Cardiac pacemaker in situ 04/28/2012  . CHF (congestive heart failure) (HCC) 04/23/2012  . Chronic airway obstruction, not elsewhere classified 04/26/2012  . Congestive heart failure, unspecified 04/28/2012  . Coronary atherosclerosis of native coronary artery 04/28/2012  . Degenerative joint disease 04/21/2012  . Depression    mild per pt  . Edema 04/28/2012  . Essential hypertension 10/17/2012  . Fall 04/21/2012   Prolonged downtime 04/20/2012   . GERD (gastroesophageal reflux disease) 09/13/2012  . Hip pain, right 09/02/2013  . History of rhabdomyolysis   . HTN (hypertension)   . Hyperlipidemia   . Hypertrophy of prostate without urinary obstruction and other lower urinary tract symptoms (LUTS) 04/28/2012  . Ingrowing nail 06/11/2012  . Insomnia 11/15/2012  . Insomnia, unspecified 04/28/2012  . Lumbago   . Macular degeneration   . Major depressive disorder, single episode, unspecified 04/28/2012  .  Muscle weakness (generalized) 04/26/2012  . NSTEMI (non-ST elevated myocardial infarction) (HCC) 04/23/2012   Related to the stress of rhabdomyolysis and prolonged downtime prior to admission in a patient with known coronary artery disease   . Osteoarthritis     . Other specified disease of white blood cells 04/26/2012  . Pacemaker 04/21/2012   Near EOL.   . Paroxysmal atrial tachycardia (HCC) 04/21/2012   Brief episodes. Not felt to be an anticoagulation candidate because of age and frailty   . Pericarditis 04/21/2012  . Personal history of fall 04/28/2012  . Reflux esophagitis 04/28/2012  . Rhabdomyolysis   . Seborrheic eczema 12/03/2012  . Sick sinus syndrome (HCC) 10/14/2003; 10/29/2012   MDT EnPulse implanted by Dr Amil Amen for SSS; generator change 10/29/2012 by Dr Johney Frame MDT Jana Half pacemaker  . Sporotrichosis    remote  . Synovial cyst of wrist 11/10/2014   Left wrist    . Tachy-brady syndrome (HCC) 04/21/2012   Pacemaker is near EOL   . Unspecified constipation 09/13/2012  . Unspecified glaucoma(365.9) 04/28/2012   Past Surgical History:  Procedure Laterality Date  . ANGIOPLASTY  9/11/190   Dr. Katrinka Blazing  . APPENDECTOMY    . BACK SURGERY  03/11/2001  . bilateral knee arthroplasty  09/28/83; 05/17/1992   right then left  . CARPAL TUNNEL RELEASE Left   . CARPAL TUNNEL RELEASE Right 2007  . CATARACT EXTRACTION Right 10/30/1996  . CORONARY ARTERY BYPASS GRAFT  2005  . CYSTOSCOPY  08/14/85  . ELBOW SURGERY     repair  . EPIDIDYMIS SURGERY  09/17/1985  . HERNIA REPAIR Right 05/19/1976  . PACEMAKER GENERATOR CHANGE  10/29/12   Dr. Johney Frame  . PACEMAKER GENERATOR CHANGE N/A 10/29/2012   Procedure: PACEMAKER GENERATOR CHANGE;  Surgeon: Hillis Range, MD;  Location: [facility];  Service: Cardiovascular;  Laterality: N/A;  . PACEMAKER INSERTION  10/14/2003;10/29/2012   MDT Implanted by Dr Amil Amen for SSS; generator change 10/29/2012 by Dr Johney Frame [employer]  . REPLACEMENT TOTAL KNEE BILATERAL  1992 and 1996  . ROTATOR CUFF REPAIR Right 2008  . SHOULDER OPEN ROTATOR CUFF REPAIR Left 02/07/1996  . SIGMOIDOSCOPY  08/26/2001  . TOE SURGERY Right 2005   correction  . TRANSURETHRAL RESECTION OF PROSTATE  07/27/1983  . YAG LASER APPLICATION Right 12/26/1973   eye  . YAG  LASER APPLICATION Right 04/30/2001  . YAG LASER APPLICATION Left 05/07/2001    Allergies  Allergen Reactions  . Aleve [Naproxen Sodium]   . Celebrex [Celecoxib]     Lips swelled  . Sulfur     Lips swelled  . Vioxx [Rofecoxib] Other (See Comments)    unknown    Allergies as of 06/14/2017      Reactions   Aleve [naproxen Sodium]    Celebrex [celecoxib]    Lips swelled   Sulfur    Lips swelled   Vioxx [rofecoxib] Other (See Comments)   unknown      Medication List        Accurate as of 06/14/17  2:16 PM. Always use your most recent med list.          acetaminophen 325 MG tablet Commonly known as:  TYLENOL Take 650 mg by mouth every 4 (four) hours as needed.   ALLEVYN AG GENTLE 4"X4" Pads Apply topically daily as needed (Replace as needed). Silver-sulfadiaz-foam bandage)   amoxicillin 500 MG tablet Commonly known as:  AMOXIL Take 500 mg by mouth. Take one tablet one hour prior to surgery  aspirin 81 MG tablet Take 81 mg by mouth daily.   bimatoprost 0.01 % Soln Commonly known as:  LUMIGAN Place 1 drop into both eyes at bedtime.   carbamide peroxide 6.5 % OTIC solution Commonly known as:  DEBROX 5 drops into affected ear for 3 nights as needed for ear wax at bedtime   diclofenac sodium 1 % Gel Commonly known as:  VOLTAREN Apply four times a day as needed for pain   finasteride 5 MG tablet Commonly known as:  PROSCAR Take one tablet by mouth once daily   fluticasone 50 MCG/ACT nasal spray Commonly known as:  FLONASE Place 1 spray into both nostrils daily.   furosemide 20 MG tablet Commonly known as:  LASIX Take one tablet by mouth once daily. One twice weekly as needed for increased weight 2-3 lbs   guaiFENesin 600 MG 12 hr tablet Commonly known as:  MUCINEX Take 600 mg by mouth 2 (two) times daily.   HYDROCORTISONE EX Apply 1 application topically 2 (two) times daily as needed. For itching   ICAPS PO Take 1 tablet by mouth daily.   KLOR-CON M10  10 MEQ tablet Generic drug:  potassium chloride Take 1 tablet by mouth twice daily  as needed with Lasix   lidocaine 4 % cream Commonly known as:  ASPERCREME W/LIDOCAINE Apply morning and evening to painful area of the lower back   LIQUID TEARS OP Apply to eye 4 (four) times daily as needed.   lisinopril 20 MG tablet Commonly known as:  PRINIVIL,ZESTRIL Take one tablet daily for blood pressure   loratadine 10 MG tablet Commonly known as:  CLARITIN Take 10 mg by mouth daily. Reported on 07/13/2015   methadone 5 MG tablet Commonly known as:  DOLOPHINE Take one tablet by mouth every morning for pain; Take One and Half tablet (7.5mg ) by mouth every evening for pain.   metoprolol tartrate 25 MG tablet Commonly known as:  LOPRESSOR Take 25 mg by mouth 2 (two) times daily.   naproxen sodium 220 MG tablet Commonly known as:  ALEVE Take 220 mg by mouth 2 (two) times daily.   nitroGLYCERIN 0.4 MG SL tablet Commonly known as:  NITROSTAT Place 0.4 mg under the tongue every 5 (five) minutes as needed. Reported on 07/13/2015   omeprazole 20 MG capsule Commonly known as:  PRILOSEC Take 20 mg by mouth daily.   Polyethyl Glycol-Propyl Glycol 0.4-0.3 % Soln Apply 1 drop to eye 2 (two) times daily. Both eyes   polyethylene glycol packet Commonly known as:  MIRALAX / GLYCOLAX Take 17 g by mouth daily. Can also take daily as needed at in addition to the scheduled dose   senna-docusate 8.6-50 MG tablet Commonly known as:  Senokot-S Take 1 tablet by mouth at bedtime.   sertraline 50 MG tablet Commonly known as:  ZOLOFT Take 50 mg by mouth. Take one tablet at bedtime   sodium chloride 0.65 % Soln nasal spray Commonly known as:  OCEAN Place 1 spray into both nostrils 3 (three) times daily.   tamsulosin 0.4 MG Caps capsule Commonly known as:  FLOMAX Take 0.4 mg by mouth daily.   zolpidem 5 MG tablet Commonly known as:  AMBIEN Take one tablet by mouth at bedtime for rest        Review of Systems  Constitutional: Negative for appetite change, chills, fatigue and fever.  HENT: Positive for drooling. Negative for congestion, rhinorrhea, sinus pressure, sinus pain, sneezing, sore throat and trouble swallowing.  Eyes: Negative for discharge and redness.  Respiratory: Negative for chest tightness and wheezing.        Chronic occasional cough   Cardiovascular: Negative for chest pain, palpitations and leg swelling.  Gastrointestinal: Negative for abdominal distention, abdominal pain, constipation, diarrhea, nausea and vomiting.  Endocrine: Negative for cold intolerance, heat intolerance, polydipsia, polyphagia and polyuria.  Genitourinary: Negative for dysuria, flank pain and urgency.  Musculoskeletal: Positive for arthralgias and gait problem.  Skin: Negative for color change and rash.       Facility Nurse applies Zinc oxide to sacral area on previous skin redness.   Neurological: Negative for dizziness, light-headedness and headaches.  Psychiatric/Behavioral: Negative for agitation, confusion and sleep disturbance. The patient is not nervous/anxious.     Immunization History  Administered Date(s) Administered  . Influenza Whole 02/11/2013  . Influenza-Unspecified 02/18/2009, 02/14/2012, 01/29/2014, 01/28/2015, 02/03/2016, 01/31/2017  . PPD Test 12/18/2000, 04/25/2012, 05/09/2012  . Pneumococcal Conjugate-13 07/31/2003  . Pneumococcal Polysaccharide-23 04/24/2009  . Tdap 04/21/2012  . Zoster 07/31/2006, 04/25/2007   Pertinent  Health Maintenance Due  Topic Date Due  . INFLUENZA VACCINE  Completed  . PNA vac Low Risk Adult  Completed   Fall Risk  12/11/2016 12/14/2015 07/13/2015 12/15/2014 08/04/2014  Falls in the past year? No Yes No No No  Comment - 12/11/15 - - -  Number falls in past yr: - 1 - - -  Injury with Fall? - No - - -  Risk for fall due to : - - - - History of fall(s);Impaired balance/gait    Vitals:   06/14/17 1012  BP: 100/60  Pulse: 61   Resp: 14  Temp: 98 F (36.7 C)  SpO2: 90%  Weight: 182 lb 3.2 oz (82.6 kg)  Height: 5\' 8"  (1.727 m)   Body mass index is 27.7 kg/m. Physical Exam  Constitutional: He is oriented to person, place, and time. He appears well-developed.  Frail elderly in no acute distress   HENT:  Head: Normocephalic.  Right Ear: External ear normal.  Left Ear: External ear normal.  Mouth/Throat: Oropharynx is clear and moist. No oropharyngeal exudate.  Eyes: Conjunctivae and EOM are normal. Pupils are equal, round, and reactive to light. Right eye exhibits no discharge. Left eye exhibits no discharge. No scleral icterus.  Neck: Normal range of motion. No JVD present. No thyromegaly present.  Cardiovascular: Normal rate, regular rhythm and intact distal pulses. Exam reveals no gallop and no friction rub.  No murmur heard. Pace maker   Pulmonary/Chest: Effort normal and breath sounds normal. No respiratory distress. He has no wheezes. He has no rales.  Abdominal: Soft. Bowel sounds are normal. He exhibits no distension. There is no tenderness. There is no rebound and no guarding.  Musculoskeletal: He exhibits no tenderness.  Moves x 4 extremities unsteady gait  Spends most time on wheelchair.Uses FWW for short distance. Bilateral lower extremities trace edema. Knee high ted hose in place.   Lymphadenopathy:    He has no cervical adenopathy.  Neurological: He is oriented to person, place, and time. Coordination normal.  Skin: Skin is warm and dry. No rash noted. No erythema.  Psychiatric: He has a normal mood and affect. His behavior is normal. Thought content normal.   Labs reviewed: Recent Labs    06/22/16 09/14/16 03/22/17  NA 137 136* 136  136  K 4.7 4.7 4.5  4.5  BUN 37* 37* 40*  40*  CREATININE 1.3 1.5* 1.40  1.40  CALCIUM  --   --  9.4  9.4   Recent Labs    09/14/16 03/22/17  AST 15 7  7   ALT 13 11  11   ALKPHOS 69 67  67  BILITOT  --  0.6  0.6  PROT  --  6.6  6.6  ALBUMIN   --  3.9  3.9   Recent Labs    09/14/16 03/22/17  WBC 5.4 5.8  HGB 11.7* 11.8  HCT 35* 34  PLT 138*  --    Lab Results  Component Value Date   TSH 3.55 09/14/2016   Lab Results  Component Value Date   HGBA1C 6.1 (H) 04/22/2012   Lab Results  Component Value Date   CHOL 149 04/22/2012   HDL 35 (L) 04/22/2012   LDLCALC 96 04/22/2012   TRIG 89 04/22/2012   CHOLHDL 4.3 04/22/2012    Significant Diagnostic Results in last 30 days:  No results found.  Assessment/Plan 1. Hypertensive heart and chronic kidney disease with heart failure and with stage 5 chronic kidney disease, or end stage renal disease B/p stable.continue on metoprolol 25 mg tablet twice daily and Lisinopril 20 mg tablet daily.on ASA 81 mg for prophylaxis.check CBC/diff and CMP 06/18/2017.   2. Congestive heart failure, unspecified HF chronicity Weight stable.bilateral lower extremities trace edema.occasional shortness of breath exertion. Lungs CTA.continue on Furosemide 20 mg tablet daily and 20 mg  twice weekly as needed for increased weight 2-3 lbs,metoprolol 25 mg tablet twice daily and Lisinopril 20 mg tablet daily.On Potassium supplement.check CBC/diff and CMP 06/18/2017.     3. Major depression, chronic Feeling down today due to his daughter being sick but states coping well.continue on sertraline 50 mg tablet daily.continue to monitor for mood changes.check TSH level 06/18/2017.   Family/ staff Communication: Reviewed plan of care with patient and facility Nurse.   Labs/tests ordered:  CBC/diff and CMP and TSH level 06/18/2017.    Caesar Bookman, NP

## 2017-06-15 DIAGNOSIS — B351 Tinea unguium: Secondary | ICD-10-CM | POA: Diagnosis not present

## 2017-06-15 DIAGNOSIS — M79672 Pain in left foot: Secondary | ICD-10-CM | POA: Diagnosis not present

## 2017-06-15 DIAGNOSIS — M79671 Pain in right foot: Secondary | ICD-10-CM | POA: Diagnosis not present

## 2017-06-18 ENCOUNTER — Encounter: Payer: Self-pay | Admitting: *Deleted

## 2017-06-18 DIAGNOSIS — I132 Hypertensive heart and chronic kidney disease with heart failure and with stage 5 chronic kidney disease, or end stage renal disease: Secondary | ICD-10-CM | POA: Diagnosis not present

## 2017-06-18 DIAGNOSIS — F341 Dysthymic disorder: Secondary | ICD-10-CM | POA: Diagnosis not present

## 2017-06-18 DIAGNOSIS — I509 Heart failure, unspecified: Secondary | ICD-10-CM | POA: Diagnosis not present

## 2017-06-18 LAB — COMPLETE METABOLIC PANEL WITH GFR
ALBUMIN: 3.9
ALK PHOS: 69
ALT: 12
AST: 14
BUN: 38 — AB (ref 4–21)
CALCIUM: 9.2
Creat: 1.46
GLUCOSE: 118
Potassium: 4.5
Sodium: 136
Total Bilirubin: 0.5
Total Protein: 6.4 g/dL

## 2017-06-18 LAB — CBC
HCT: 33
HGB: 11.6
WBC: 5.4
platelet count: 123

## 2017-06-18 LAB — CBC AND DIFFERENTIAL
HCT: 33 — AB (ref 41–53)
HEMOGLOBIN: 11.6 — AB (ref 13.5–17.5)
Platelets: 123 — AB (ref 150–399)
WBC: 5.4

## 2017-06-18 LAB — HEPATIC FUNCTION PANEL
ALK PHOS: 69 (ref 25–125)
ALT: 12 (ref 10–40)
AST: 14 (ref 14–40)
BILIRUBIN, TOTAL: 0.5

## 2017-06-18 LAB — BASIC METABOLIC PANEL
CREATININE: 1.5 — AB (ref 0.6–1.3)
GLUCOSE: 118
POTASSIUM: 4.5 (ref 3.4–5.3)
SODIUM: 136 — AB (ref 137–147)

## 2017-06-18 LAB — TSH
TSH: 5.52
TSH: 5.52 (ref 0.41–5.90)

## 2017-06-21 DIAGNOSIS — E039 Hypothyroidism, unspecified: Secondary | ICD-10-CM | POA: Diagnosis not present

## 2017-06-21 LAB — TSH: TSH: 4.53 (ref 0.41–5.90)

## 2017-06-21 LAB — THYROID PANEL
T3 TOTAL: 83
T4 TOTAL: 6.8
TSH: 4.53

## 2017-06-22 ENCOUNTER — Encounter: Payer: Self-pay | Admitting: *Deleted

## 2017-07-02 ENCOUNTER — Encounter: Payer: Self-pay | Admitting: Family

## 2017-07-02 ENCOUNTER — Non-Acute Institutional Stay: Payer: Medicare Other | Admitting: Family

## 2017-07-02 DIAGNOSIS — L89621 Pressure ulcer of left heel, stage 1: Secondary | ICD-10-CM | POA: Diagnosis not present

## 2017-07-02 NOTE — Progress Notes (Signed)
Location:  [facility] Room Number: 4 Place of Service:  ALF [phone] Provider: Ryu Cerreta FNP-C  Oneal GroutPandey, Mahima, MD  Patient Care Team: Oneal GroutPandey, Mahima, MD as PCP - General (Internal Medicine) Shelva MajesticWest, [facility]ast, Man X, NP as Nurse Practitioner (Nurse Practitioner) Lyn RecordsSmith, Henry W, MD as Consulting Physician (Cardiology) Barron AlvineGrapey, David, MD as Consulting Physician (Urology) Francisca DecemberEdmunds, John H., MD as Attending Physician (Cardiology) Leta SpellerHouston, Frank, MD as Consulting Physician (Dermatology) Elise BenneGould, Sigmund, MD as Consulting Physician (Ophthalmology)  Extended Emergency Contact Information Primary Emergency Contact: Mortimore,Janet Address: [address]          [place], [place] [address] [place] Home Phone: [phone] Mobile Phone: [phone] Relation: Daughter  Code Status:  DNR Goals of care: Advanced Directive information Advanced Directives 07/02/2017  Does Patient Have a Medical Advance Directive? Yes  Type of Estate agentAdvance Directive Healthcare Power of BlodgettAttorney;Living will;Out of facility DNR (pink MOST or yellow form)  Does patient want to make changes to medical advance directive? No - Patient declined  Copy of Healthcare Power of Attorney in Chart? Yes  Pre-existing out of facility DNR order (yellow form or pink MOST form) Yellow form placed in chart (order not valid for inpatient use);Pink MOST form placed in chart (order not valid for inpatient use)     Chief Complaint  Patient presents with  . Acute Visit    Left heel redness/tenderness    HPI:  Pt is a [age] y.o. [facility] for evaluation of left heel redness and tenderness.He is seen in his room today with facility Nurse present at bedside.He states left heel hurts especially at 5 in the morning.Heel also tender when touched.Facility Nurse states patient was encouraged to use pillow to keep heel off the mattress.patient states does  not like the pillow " very uncomforted".He describes his appetite as good.He denies any fever or chills.   Past Medical History:  Diagnosis Date  . Abnormality of gait 06/11/2012  . Alcohol abuse 04/26/2012  . Allergy, unspecified not elsewhere classified 04/28/2012  . Atrial fibrillation (HCC) 04/28/2012  . BPH (benign prostatic hyperplasia)   . CAD (coronary artery disease)    s/p CABG 2005  . Cardiac pacemaker in situ 04/28/2012  . CHF (congestive heart failure) (HCC) 04/23/2012  . Chronic airway obstruction, not elsewhere classified 04/26/2012  . Congestive heart failure, unspecified 04/28/2012  . Coronary atherosclerosis of native coronary artery 04/28/2012  . Degenerative joint disease 04/21/2012  . Depression    mild per pt  . Edema 04/28/2012  . Essential hypertension 10/17/2012  . Fall 04/21/2012   Prolonged downtime 04/20/2012   . GERD (gastroesophageal reflux disease) 09/13/2012  . Hip pain, right 09/02/2013  . History of rhabdomyolysis   . HTN (hypertension)   . Hyperlipidemia   . Hypertrophy of prostate without urinary obstruction and other lower urinary tract symptoms (LUTS) 04/28/2012  . Ingrowing nail 06/11/2012  . Insomnia 11/15/2012  . Insomnia, unspecified 04/28/2012  . Lumbago   . Macular degeneration   . Major depressive disorder, single episode, unspecified 04/28/2012  . Muscle weakness (generalized) 04/26/2012  . NSTEMI (non-ST elevated myocardial infarction) (HCC) 04/23/2012   Related to the stress of rhabdomyolysis and prolonged downtime prior to admission in a patient with known coronary artery disease   . Osteoarthritis   . Other specified disease of white blood cells 04/26/2012  . Pacemaker 04/21/2012   Near EOL.   . Paroxysmal atrial  tachycardia (HCC) 04/21/2012   Brief episodes. Not felt to be an anticoagulation candidate because of age and frailty   . Pericarditis 04/21/2012  . Personal history of fall 04/28/2012  . Reflux esophagitis 04/28/2012  . Rhabdomyolysis   .  Seborrheic eczema 12/03/2012  . Sick sinus syndrome (HCC) 10/14/2003; 10/29/2012   MDT EnPulse implanted by Dr Amil Amen for SSS; generator change 10/29/2012 by Dr Johney Frame MDT Jana Half pacemaker  . Sporotrichosis    remote  . Synovial cyst of wrist 11/10/2014   Left wrist    . Tachy-brady syndrome (HCC) 04/21/2012   Pacemaker is near EOL   . Unspecified constipation 09/13/2012  . Unspecified glaucoma(365.9) 04/28/2012   Past Surgical History:  Procedure Laterality Date  . ANGIOPLASTY  9/11/190   Dr. Katrinka Blazing  . APPENDECTOMY    . BACK SURGERY  03/11/2001  . bilateral knee arthroplasty  09/28/83; 05/17/1992   right then left  . CARPAL TUNNEL RELEASE Left   . CARPAL TUNNEL RELEASE Right 2007  . CATARACT EXTRACTION Right 10/30/1996  . CORONARY ARTERY BYPASS GRAFT  2005  . CYSTOSCOPY  08/14/85  . ELBOW SURGERY     repair  . EPIDIDYMIS SURGERY  09/17/1985  . HERNIA REPAIR Right 05/19/1976  . PACEMAKER GENERATOR CHANGE  10/29/12   Dr. Johney Frame  . PACEMAKER GENERATOR CHANGE N/A 10/29/2012   Procedure: PACEMAKER GENERATOR CHANGE;  Surgeon: Hillis Range, MD;  Location: [facility] CATH LAB;  Service: Cardiovascular;  Laterality: N/A;  . PACEMAKER INSERTION  10/14/2003;10/29/2012   MDT Implanted by Dr Amil Amen for SSS; generator change 10/29/2012 by Dr Johney Frame [employer] [place]  . REPLACEMENT TOTAL KNEE BILATERAL  1992 and 1996  . ROTATOR CUFF REPAIR Right 2008  . SHOULDER OPEN ROTATOR CUFF REPAIR Left 02/07/1996  . SIGMOIDOSCOPY  08/26/2001  . TOE SURGERY Right 2005   correction  . TRANSURETHRAL RESECTION OF PROSTATE  07/27/1983  . YAG LASER APPLICATION Right 12/26/1973   eye  . YAG LASER APPLICATION Right 04/30/2001  . YAG LASER APPLICATION Left 05/07/2001    Allergies  Allergen Reactions  . Aleve [Naproxen Sodium]   . Celebrex [Celecoxib]     Lips swelled  . Sulfur     Lips swelled  . Vioxx [Rofecoxib] Other (See Comments)    unknown    Outpatient Encounter Medications as of 07/02/2017  Medication Sig  . acetaminophen  (TYLENOL) 325 MG tablet Take 650 mg by mouth every 4 (four) hours as needed.  Marland Kitchen amoxicillin (AMOXIL) 500 MG tablet Take 500 mg by mouth. Take one tablet one hour prior to surgery  . aspirin 81 MG tablet Take 81 mg by mouth daily.  . bimatoprost (LUMIGAN) 0.01 % SOLN Place 1 drop into both eyes at bedtime.   . carbamide peroxide (DEBROX) 6.5 % otic solution 5 drops into affected ear for 3 nights as needed for ear wax at bedtime  . Dentifrices (MI PASTE) PSTE Place 1 application onto teeth at bedtime as needed.  . diclofenac sodium (VOLTAREN) 1 % GEL Apply four times a day as needed for pain  . finasteride (PROSCAR) 5 MG tablet Take one tablet by mouth once daily  . fluticasone (FLONASE) 50 MCG/ACT nasal spray Place 1 spray into both nostrils daily.   . furosemide (LASIX) 20 MG tablet Take one tablet by mouth once daily. One twice weekly as needed for increased weight 2-3 lbs  . guaiFENesin (MUCINEX) 600 MG 12 hr tablet Take 600 mg by mouth 2 (two) times daily.   Marland Kitchen  HYDROCORTISONE EX Apply 1 application topically 2 (two) times daily as needed. For itching  . Iodoquinol-HC (DERMAZENE) 1 % CREA Apply 1 application topically 2 (two) times daily as needed.  Marland Kitchen KLOR-CON M10 10 MEQ tablet Take 1 tablet by mouth twice daily  as needed with Lasix  . lidocaine (ASPERCREME W/LIDOCAINE) 4 % cream Apply morning and evening to painful area of the lower back  . lisinopril (PRINIVIL,ZESTRIL) 20 MG tablet Take one tablet daily for blood pressure  . loratadine (CLARITIN) 10 MG tablet Take 10 mg by mouth daily. Reported on 07/13/2015  . methadone (DOLOPHINE) 5 MG tablet Take one tablet by mouth every morning for pain; Take One and Half tablet (7.5mg ) by mouth every evening for pain.  . metoprolol tartrate (LOPRESSOR) 25 MG tablet Take 25 mg by mouth 2 (two) times daily.  . Multiple Vitamins-Minerals (ICAPS PO) Take 1 tablet by mouth daily.   . naproxen sodium (ANAPROX) 220 MG tablet Take 220 mg by mouth 2 (two) times  daily.  . nitroGLYCERIN (NITROSTAT) 0.4 MG SL tablet Place 0.4 mg under the tongue every 5 (five) minutes as needed. Reported on 07/13/2015  . omeprazole (PRILOSEC) 20 MG capsule Take 20 mg by mouth daily.  Bertram Gala Glycol-Propyl Glycol 0.4-0.3 % SOLN Apply 1 drop to eye 2 (two) times daily. Both eyes  . polyethylene glycol (MIRALAX / GLYCOLAX) packet Take 17 g by mouth daily. Can also take daily as needed at in addition to the scheduled dose  . Polyvinyl Alcohol (LIQUID TEARS OP) Apply to eye 4 (four) times daily as needed.  . senna-docusate (SENOKOT-S) 8.6-50 MG tablet Take 1 tablet by mouth at bedtime.   . sertraline (ZOLOFT) 25 MG tablet Take 25 mg by mouth at bedtime.  Marland Kitchen Silver (ALLEVYN AG GENTLE) 4"X4" PADS Apply topically daily as needed (Replace as needed). Silver-sulfadiaz-foam bandage)  . sodium chloride (OCEAN) 0.65 % SOLN nasal spray Place 1 spray into both nostrils 3 (three) times daily.   . Tamsulosin HCl (FLOMAX) 0.4 MG CAPS Take 0.4 mg by mouth daily.  Marland Kitchen zinc oxide (BALMEX) 11.3 % CREA cream Apply 1 application topically 2 (two) times daily.  Marland Kitchen zolpidem (AMBIEN) 5 MG tablet Take one tablet by mouth at bedtime for rest  . [DISCONTINUED] sertraline (ZOLOFT) 50 MG tablet Take 50 mg by mouth. Take one tablet at bedtime   No facility-administered encounter medications on file as of 07/02/2017.     Review of Systems  Constitutional: Negative for chills and fever.  Respiratory: Negative for cough, chest tightness, shortness of breath and wheezing.   Cardiovascular: Positive for leg swelling. Negative for chest pain and palpitations.  Gastrointestinal: Negative for abdominal distention, abdominal pain, constipation, diarrhea, nausea and vomiting.  Musculoskeletal: Positive for arthralgias and gait problem.  Skin: Negative for pallor and rash.       Left heel skin redness and tenderness   Psychiatric/Behavioral: Negative for agitation, confusion and sleep disturbance.     Immunization History  Administered Date(s) Administered  . Influenza Whole 02/11/2013  . Influenza-Unspecified 02/18/2009, 02/14/2012, 01/29/2014, 01/28/2015, 02/03/2016, 01/31/2017  . PPD Test 12/18/2000, 04/25/2012, 05/09/2012  . Pneumococcal Conjugate-13 07/31/2003  . Pneumococcal Polysaccharide-23 04/24/2009  . Tdap 04/21/2012  . Zoster 07/31/2006, 04/25/2007   Pertinent  Health Maintenance Due  Topic Date Due  . INFLUENZA VACCINE  Completed  . PNA vac Low Risk Adult  Completed   Fall Risk  12/11/2016 12/14/2015 07/13/2015 12/15/2014 08/04/2014  Falls in the past year? No Yes  No No No  Comment - 12/11/15 - - -  Number falls in past yr: - 1 - - -  Injury with Fall? - No - - -  Risk for fall due to : - - - - History of fall(s);Impaired balance/gait    Vitals:   07/02/17 0957  BP: 134/62  Pulse: 60  Resp: 18  Temp: 98.6 F (37 C)  TempSrc: Oral  SpO2: 96%  Weight: 181 lb (82.1 kg)  Height: 5\' 8"  (1.727 m)   Body mass index is 27.52 kg/m. Physical Exam  Constitutional: He is oriented to person, place, and time. He appears well-developed and well-nourished.  Elderly in no acute distress   HENT:  Head: Normocephalic.  Mouth/Throat: Oropharynx is clear and moist. No oropharyngeal exudate.  Eyes: Conjunctivae and EOM are normal. Pupils are equal, round, and reactive to light. Right eye exhibits no discharge. Left eye exhibits no discharge. No scleral icterus.  Cardiovascular: Normal rate, regular rhythm, normal heart sounds and intact distal pulses. Exam reveals no gallop and no friction rub.  No murmur heard. Pacemaker   Pulmonary/Chest: Effort normal and breath sounds normal. No respiratory distress. He has no wheezes. He has no rales.  Abdominal: Soft. Bowel sounds are normal. He exhibits no distension. There is no tenderness. There is no rebound.  Musculoskeletal:  Moves x 4 extremities.unsteady gait uses wheelchair.bilateral lower extremities pitting edema. Knee  high ted hose in place.   Neurological: He is oriented to person, place, and time. Coordination normal.  Skin: Skin is warm and dry. No rash noted.  Left heel blanchable skin redness tender to touch.Skin peeling also noted. Surrounding skin tissue without any signs of infection.    Psychiatric: He has a normal mood and affect.    Labs reviewed: Recent Labs    09/14/16 03/22/17 06/18/17  NA 136* 136  136 136*  136  K 4.7 4.5  4.5 4.5  4.5  BUN 37* 40*  40* 38*  CREATININE 1.5* 1.40  1.40 1.5*  1.46  CALCIUM  --  9.4  9.4 9.2   Recent Labs    03/22/17 06/18/17  AST 7  7 14  14   ALT 11  11 12  12   ALKPHOS 67  67 69  69  BILITOT 0.6  0.6 0.5  PROT 6.6  6.6 6.4  ALBUMIN 3.9  3.9 3.9   Recent Labs    09/14/16 03/22/17 06/18/17  WBC 5.4 5.8 5.4  5.4  HGB 11.7* 11.8 11.6*  11.6  HCT 35* 34 33*  33  PLT 138*  --  123*   Lab Results  Component Value Date   TSH 4.53 06/21/2017   TSH 4.53 06/21/2017   Lab Results  Component Value Date   HGBA1C 6.1 (H) 04/22/2012   Lab Results  Component Value Date   CHOL 149 04/22/2012   HDL 35 (L) 04/22/2012   LDLCALC 96 04/22/2012   TRIG 89 04/22/2012   CHOLHDL 4.3 04/22/2012    Significant Diagnostic Results in last 30 days:  No results found.  Assessment/Plan   Pressure ulcer of left heel, stage 1 Afebrile.Left heel blanchable redness and tender to touch.Peeling of the skin also noted.No signs of infections noted.Facility Nurse cleanse heel with saline,pat dry and cover with foam dressing.change foam dressing every 3 days and as needed if soiled until ulcer resolve.    Family/ staff Communication: Reviewed plan of care with patient and facility Nurse.   Labs/tests ordered: None  Sandrea Hughs, NP

## 2017-07-06 ENCOUNTER — Non-Acute Institutional Stay: Payer: Medicare Other | Admitting: Family

## 2017-07-06 DIAGNOSIS — Z7189 Other specified counseling: Secondary | ICD-10-CM | POA: Diagnosis not present

## 2017-07-06 DIAGNOSIS — R609 Edema, unspecified: Secondary | ICD-10-CM

## 2017-07-06 NOTE — Progress Notes (Signed)
Location:  [facility] Room Number: 4  Place of Service:  ALF [phone] Provider: Dinah Anetic FP-C  Oneal GroutPandey, Mahima, MD  Patient Care Team: Oneal GroutPandey, Mahima, MD as PCP - General (Internal Medicine) Shelva MajesticWest, [facility], Man X, NP as Nurse Practitioner (Nurse Practitioner) Lyn RecordsSmith, Henry W, MD as Consulting Physician (Cardiology) Barron AlvineGrapey, David, MD as Consulting Physician (Urology) Francisca DecemberEdmunds, John H., MD as Attending Physician (Cardiology) Leta SpellerHouston, Frank, MD as Consulting Physician (Dermatology) Elise BenneGould, Sigmund, MD as Consulting Physician (Ophthalmology)  Extended Emergency Contact Information Primary Emergency Contact: Mortimore,Janet Address: [address]          [place], [place] [address] [place] Home Phone: [phone] Mobile Phone: [phone] Relation: Daughter  Code Status:  DNR Goals of care: Advanced Directive information Advanced Directives 07/02/2017  Does Patient Have a Medical Advance Directive? Yes  Type of Estate agentAdvance Directive Healthcare Power of StewartAttorney;Living will;Out of facility DNR (pink MOST or yellow form)  Does patient want to make changes to medical advance directive? No - Patient declined  Copy of Healthcare Power of Attorney in Chart? Yes  Pre-existing out of facility DNR order (yellow form or pink MOST form) Yellow form placed in chart (order not valid for inpatient use);Pink MOST form placed in chart (order not valid for inpatient use)     Chief Complaint  Patient presents with  . Acute Visit    MOST Form, Edema     HPI:  Pt is a [age] y.o. [facility] for evaluation of lower extremities edema and update MOST Form.He is seen in his room today with Assisted Living House Coordinator present at bedside.His lower extremities edema persist.He denies any shortness of breath,wheezing or recent abrupt weight gain.He spends of the day sitting up on wheelchair.Facility Nurse states  patient had Knee high ted hose but did not like to wear them.Importance of Ted hose discussed with patient verbalized understanding and willing to wear them.   Patient's MOST form from 11/27/2013 reviewed in the presence of patient and Assisted Living House Coordinator.MOST Form indicates Do not resuscitate in case of a cardiopulmonary event.Patient also indicated limited additional interventions but may transfer to hospital but avoid intensive care.May determine use or limitation of antibiotics when infection occurs,IVF if indicated but no feeding tube.Patient does not wish to make any changes to previous MOST form. He wishes to remain DNR for now.     Past Medical History:  Diagnosis Date  . Abnormality of gait 06/11/2012  . Alcohol abuse 04/26/2012  . Allergy, unspecified not elsewhere classified 04/28/2012  . Atrial fibrillation (HCC) 04/28/2012  . BPH (benign prostatic hyperplasia)   . CAD (coronary artery disease)    s/p CABG 2005  . Cardiac pacemaker in situ 04/28/2012  . CHF (congestive heart failure) (HCC) 04/23/2012  . Chronic airway obstruction, not elsewhere classified 04/26/2012  . Congestive heart failure, unspecified 04/28/2012  . Coronary atherosclerosis of native coronary artery 04/28/2012  . Degenerative joint disease 04/21/2012  . Depression    mild per pt  . Edema 04/28/2012  . Essential hypertension 10/17/2012  . Fall 04/21/2012   Prolonged downtime 04/20/2012   . GERD (gastroesophageal reflux disease) 09/13/2012  . Hip pain, right 09/02/2013  . History of rhabdomyolysis   . HTN (hypertension)   . Hyperlipidemia   . Hypertrophy of prostate without urinary obstruction and other lower urinary tract symptoms (LUTS) 04/28/2012  . Ingrowing nail 06/11/2012  . Insomnia 11/15/2012  .  Insomnia, unspecified 04/28/2012  . Lumbago   . Macular degeneration   . Major depressive disorder, single episode, unspecified 04/28/2012  . Muscle weakness (generalized) 04/26/2012  . NSTEMI (non-ST elevated  myocardial infarction) (HCC) 04/23/2012   Related to the stress of rhabdomyolysis and prolonged downtime prior to admission in a patient with known coronary artery disease   . Osteoarthritis   . Other specified disease of white blood cells 04/26/2012  . Pacemaker 04/21/2012   Near EOL.   . Paroxysmal atrial tachycardia (HCC) 04/21/2012   Brief episodes. Not felt to be an anticoagulation candidate because of age and frailty   . Pericarditis 04/21/2012  . Personal history of fall 04/28/2012  . Reflux esophagitis 04/28/2012  . Rhabdomyolysis   . Seborrheic eczema 12/03/2012  . Sick sinus syndrome (HCC) 10/14/2003; 10/29/2012   MDT EnPulse implanted by Dr Amil Amen for SSS; generator change 10/29/2012 by Dr Johney Frame MDT Jana Half pacemaker  . Sporotrichosis    remote  . Synovial cyst of wrist 11/10/2014   Left wrist    . Tachy-brady syndrome (HCC) 04/21/2012   Pacemaker is near EOL   . Unspecified constipation 09/13/2012  . Unspecified glaucoma(365.9) 04/28/2012   Past Surgical History:  Procedure Laterality Date  . ANGIOPLASTY  9/11/190   Dr. Katrinka Blazing  . APPENDECTOMY    . BACK SURGERY  03/11/2001  . bilateral knee arthroplasty  09/28/83; 05/17/1992   right then left  . CARPAL TUNNEL RELEASE Left   . CARPAL TUNNEL RELEASE Right 2007  . CATARACT EXTRACTION Right 10/30/1996  . CORONARY ARTERY BYPASS GRAFT  2005  . CYSTOSCOPY  08/14/85  . ELBOW SURGERY     repair  . EPIDIDYMIS SURGERY  09/17/1985  . HERNIA REPAIR Right 05/19/1976  . PACEMAKER GENERATOR CHANGE  10/29/12   Dr. Johney Frame  . PACEMAKER GENERATOR CHANGE N/A 10/29/2012   Procedure: PACEMAKER GENERATOR CHANGE;  Surgeon: Hillis Range, MD;  Location: [facility] C[facility] LAB;  Service: Cardiovascular;  Laterality: N/A;  . PACEMAKER INSERTION  10/14/2003;10/29/2012   MDT Implanted by Dr Amil Amen for SSS; generator change 10/29/2012 by Dr Johney Frame [employer] [place]  . REPLACEMENT TOTAL KNEE BILATERAL  1992 and 1996  . ROTATOR CUFF REPAIR Right 2008  . SHOULDER OPEN ROTATOR CUFF  REPAIR Left 02/07/1996  . SIGMOIDOSCOPY  08/26/2001  . TOE SURGERY Right 2005   correction  . TRANSURETHRAL RESECTION OF PROSTATE  07/27/1983  . YAG LASER APPLICATION Right 12/26/1973   eye  . YAG LASER APPLICATION Right 04/30/2001  . YAG LASER APPLICATION Left 05/07/2001    Allergies  Allergen Reactions  . Aleve [Naproxen Sodium]   . Celebrex [Celecoxib]     Lips swelled  . Sulfur     Lips swelled  . Vioxx [Rofecoxib] Other (See Comments)    unknown    Outpatient Encounter Medications as of 07/06/2017  Medication Sig  . acetaminophen (TYLENOL) 325 MG tablet Take 650 mg by mouth every 4 (four) hours as needed.  Marland Kitchen amoxicillin (AMOXIL) 500 MG tablet Take 500 mg by mouth. Take one tablet one hour prior to surgery  . aspirin 81 MG tablet Take 81 mg by mouth daily.  . bimatoprost (LUMIGAN) 0.01 % SOLN Place 1 drop into both eyes at bedtime.   . carbamide peroxide (DEBROX) 6.5 % otic solution 5 drops into affected ear for 3 nights as needed for ear wax at bedtime  . Dentifrices (MI PASTE) PSTE Place 1 application onto teeth at bedtime as needed.  . diclofenac sodium (VOLTAREN) 1 %  GEL Apply four times a day as needed for pain  . finasteride (PROSCAR) 5 MG tablet Take one tablet by mouth once daily  . fluticasone (FLONASE) 50 MCG/ACT nasal spray Place 1 spray into both nostrils daily.   . furosemide (LASIX) 20 MG tablet Take one tablet by mouth once daily. One twice weekly as needed for increased weight 2-3 lbs  . guaiFENesin (MUCINEX) 600 MG 12 hr tablet Take 600 mg by mouth 2 (two) times daily.   Marland Kitchen HYDROCORTISONE EX Apply 1 application topically 2 (two) times daily as needed. For itching  . Iodoquinol-HC (DERMAZENE) 1 % CREA Apply 1 application topically 2 (two) times daily as needed.  Marland Kitchen KLOR-CON M10 10 MEQ tablet Take 1 tablet by mouth twice daily  as needed with Lasix  . lidocaine (ASPERCREME W/LIDOCAINE) 4 % cream Apply morning and evening to painful area of the lower back  . lisinopril  (PRINIVIL,ZESTRIL) 20 MG tablet Take one tablet daily for blood pressure  . loratadine (CLARITIN) 10 MG tablet Take 10 mg by mouth daily. Reported on 07/13/2015  . methadone (DOLOPHINE) 5 MG tablet Take one tablet by mouth every morning for pain; Take One and Half tablet (7.5mg ) by mouth every evening for pain.  . metoprolol tartrate (LOPRESSOR) 25 MG tablet Take 25 mg by mouth 2 (two) times daily.  . Multiple Vitamins-Minerals (ICAPS PO) Take 1 tablet by mouth daily.   . naproxen sodium (ANAPROX) 220 MG tablet Take 220 mg by mouth 2 (two) times daily.  . nitroGLYCERIN (NITROSTAT) 0.4 MG SL tablet Place 0.4 mg under the tongue every 5 (five) minutes as needed. Reported on 07/13/2015  . omeprazole (PRILOSEC) 20 MG capsule Take 20 mg by mouth daily.  Bertram Gala Glycol-Propyl Glycol 0.4-0.3 % SOLN Apply 1 drop to eye 2 (two) times daily. Both eyes  . polyethylene glycol (MIRALAX / GLYCOLAX) packet Take 17 g by mouth daily. Can also take daily as needed at in addition to the scheduled dose  . Polyvinyl Alcohol (LIQUID TEARS OP) Apply to eye 4 (four) times daily as needed.  . senna-docusate (SENOKOT-S) 8.6-50 MG tablet Take 1 tablet by mouth at bedtime.   . sertraline (ZOLOFT) 25 MG tablet Take 25 mg by mouth at bedtime.  Marland Kitchen Silver (ALLEVYN AG GENTLE) 4"X4" PADS Apply topically daily as needed (Replace as needed). Silver-sulfadiaz-foam bandage)  . sodium chloride (OCEAN) 0.65 % SOLN nasal spray Place 1 spray into both nostrils 3 (three) times daily.   . Tamsulosin HCl (FLOMAX) 0.4 MG CAPS Take 0.4 mg by mouth daily.  Marland Kitchen zinc oxide (BALMEX) 11.3 % CREA cream Apply 1 application topically 2 (two) times daily.  Marland Kitchen zolpidem (AMBIEN) 5 MG tablet Take one tablet by mouth at bedtime for rest   No facility-administered encounter medications on file as of 07/06/2017.     Review of Systems  Constitutional: Negative for appetite change, chills, fatigue and fever.  HENT: Negative for congestion, rhinorrhea, sinus  pressure, sinus pain, sneezing and sore throat.   Eyes: Negative for discharge, redness and itching.  Respiratory: Negative for cough, chest tightness, shortness of breath and wheezing.   Cardiovascular: Positive for leg swelling. Negative for chest pain and palpitations.  Gastrointestinal: Negative for abdominal distention, abdominal pain, constipation, diarrhea, nausea and vomiting.  Genitourinary: Negative for flank pain, frequency and urgency.  Musculoskeletal: Positive for arthralgias and gait problem.  Skin: Negative for color change, pallor and rash.  Neurological: Negative for dizziness, light-headedness and headaches.  Psychiatric/Behavioral: Negative  for agitation, confusion and sleep disturbance. The patient is not nervous/anxious.     Immunization History  Administered Date(s) Administered  . Influenza Whole 02/11/2013  . Influenza-Unspecified 02/18/2009, 02/14/2012, 01/29/2014, 01/28/2015, 02/03/2016, 01/31/2017  . PPD Test 12/18/2000, 04/25/2012, 05/09/2012  . Pneumococcal Conjugate-13 07/31/2003  . Pneumococcal Polysaccharide-23 04/24/2009  . Tdap 04/21/2012  . Zoster 07/31/2006, 04/25/2007   Pertinent  Health Maintenance Due  Topic Date Due  . INFLUENZA VACCINE  Completed  . PNA vac Low Risk Adult  Completed   Fall Risk  12/11/2016 12/14/2015 07/13/2015 12/15/2014 08/04/2014  Falls in the past year? No Yes No No No  Comment - 12/11/15 - - -  Number falls in past yr: - 1 - - -  Injury with Fall? - No - - -  Risk for fall due to : - - - - History of fall(s);Impaired balance/gait    Vitals:   07/06/17 1100  BP: (!) 160/79  Pulse: 62  Resp: 18  Temp: 97.6 F (36.4 C)  SpO2: 97%  Weight: 178 lb (80.7 kg)  Height: 5\' 8"  (1.727 m)   Body mass index is 27.06 kg/m. Physical Exam  Constitutional: He is oriented to person, place, and time. He appears well-developed.  Elderly in no acute distress   HENT:  Head: Normocephalic.  Mouth/Throat: Oropharynx is clear and  moist. No oropharyngeal exudate.  Corrective eye glasses in place   Eyes: Pupils are equal, round, and reactive to light. Conjunctivae and EOM are normal. Right eye exhibits no discharge. Left eye exhibits no discharge. No scleral icterus.  Neck: Normal range of motion. No JVD present. No thyromegaly present.  Cardiovascular: Normal rate, regular rhythm, normal heart sounds and intact distal pulses. Exam reveals no gallop and no friction rub.  No murmur heard. Pulmonary/Chest: Effort normal and breath sounds normal. No respiratory distress. He has no wheezes. He has no rales.  Abdominal: Soft. Bowel sounds are normal. He exhibits no distension. There is no tenderness. There is no rebound and no guarding.  Musculoskeletal: He exhibits no tenderness.  Unsteady gait uses wheelchair.bilateral lower extremities pitting edema.   Lymphadenopathy:    He has no cervical adenopathy.  Neurological: He is oriented to person, place, and time. Coordination normal.  Skin: Skin is warm and dry. No rash noted. No erythema. No pallor.  Psychiatric: He has a normal mood and affect.    Labs reviewed: Recent Labs    09/14/16 03/22/17 06/18/17  NA 136* 136  136 136*  136  K 4.7 4.5  4.5 4.5  4.5  BUN 37* 40*  40* 38*  CREATININE 1.5* 1.40  1.40 1.5*  1.46  CALCIUM  --  9.4  9.4 9.2   Recent Labs    03/22/17 06/18/17  AST 7  7 14  14   ALT 11  11 12  12   ALKPHOS 67  67 69  69  BILITOT 0.6  0.6 0.5  PROT 6.6  6.6 6.4  ALBUMIN 3.9  3.9 3.9   Recent Labs    09/14/16 03/22/17 06/18/17  WBC 5.4 5.8 5.4  5.4  HGB 11.7* 11.8 11.6*  11.6  HCT 35* 34 33*  33  PLT 138*  --  123*   Lab Results  Component Value Date   TSH 4.53 06/21/2017   TSH 4.53 06/21/2017   Lab Results  Component Value Date   HGBA1C 6.1 (H) 04/22/2012   Lab Results  Component Value Date   CHOL 149 04/22/2012  HDL 35 (L) 04/22/2012   LDLCALC 96 04/22/2012   TRIG 89 04/22/2012   CHOLHDL 4.3 04/22/2012     Significant Diagnostic Results in last 30 days:  No results found.  Assessment/Plan 1. Edema, unspecified type Bilateral lower extremities pitting edema noted.bilateral lungs CTA. Weight log reviewed no abrupt weight gain.Encouraged to weigh knee high ted hose on in the morning and off at bedtime and to elevate legs whenever he is seated.Facility staff to assist with ted hose.continue on furosemide 20 mg tablet daily.continue to monitor weight.monitor BMP.    2. Advanced care planning/counseling discussion Patient's MOST form from 11/27/2013 reviewed in the presence of patient and Assisted Living House Coordinator from 11:05 -1125 AM.MOST Form indicates Do not resuscitate in case of a cardiopulmonary event.Patient also indicated limited additional interventions but may transfer to hospital but avoid intensive care.May determine use or limitation of antibiotics when infection occurs,IVF if indicated but no feeding tube.Patient does not wish to make any changes to previous MOST form. He wishes to remain DNR for now.Annual review signed for no change by patient,Assisted Living House Coordinator and provider.All questions answered to the best of my knowledge.    Family/ staff Communication: Reviewed plan of care with patient, Assisted Living House Coordinator and facility Nurse.   Labs/tests ordered: None   Dinah C Ngetich, NP

## 2017-07-09 ENCOUNTER — Other Ambulatory Visit: Payer: Self-pay

## 2017-07-09 MED ORDER — ZOLPIDEM TARTRATE 5 MG PO TABS: ORAL_TABLET | ORAL | 0 refills | Status: DC

## 2017-07-09 MED ORDER — METHADONE HCL 5 MG PO TABS: ORAL_TABLET | ORAL | 0 refills | Status: DC

## 2017-08-09 ENCOUNTER — Ambulatory Visit (INDEPENDENT_AMBULATORY_CARE_PROVIDER_SITE_OTHER): Payer: Medicare Other | Admitting: *Deleted

## 2017-08-09 DIAGNOSIS — I495 Sick sinus syndrome: Secondary | ICD-10-CM

## 2017-08-10 ENCOUNTER — Encounter: Payer: Self-pay | Admitting: Cardiology

## 2017-08-10 NOTE — Progress Notes (Signed)
Remote pacemaker transmission.   

## 2017-08-14 LAB — CUP PACEART REMOTE DEVICE CHECK
Battery Impedance: 691 Ohm
Battery Remaining Longevity: 62 mo
Battery Voltage: 2.78 V
Brady Statistic AP VP Percent: 98 %
Brady Statistic AP VS Percent: 0 %
Brady Statistic AS VP Percent: 2 %
Brady Statistic AS VS Percent: 0 %
Date Time Interrogation Session: 20190418162020
Implantable Lead Implant Date: 20050622
Implantable Lead Implant Date: 20050622
Implantable Lead Location: 753859
Implantable Lead Location: 753860
Implantable Lead Model: 5076
Implantable Lead Model: 5092
Implantable Pulse Generator Implant Date: 20140708
Lead Channel Impedance Value: 435 Ohm
Lead Channel Impedance Value: 669 Ohm
Lead Channel Pacing Threshold Amplitude: 0.5 V
Lead Channel Pacing Threshold Amplitude: 0.5 V
Lead Channel Pacing Threshold Pulse Width: 0.4 ms
Lead Channel Pacing Threshold Pulse Width: 0.4 ms
Lead Channel Setting Pacing Amplitude: 2 V
Lead Channel Setting Pacing Amplitude: 2.5 V
Lead Channel Setting Pacing Pulse Width: 0.4 ms
Lead Channel Setting Sensing Sensitivity: 4 mV

## 2017-09-12 ENCOUNTER — Non-Acute Institutional Stay: Payer: Self-pay | Admitting: Internal Medicine

## 2017-09-12 DIAGNOSIS — Z593 Problems related to living in residential institution: Secondary | ICD-10-CM

## 2017-09-12 NOTE — Progress Notes (Signed)
Chart note to update nursing home status.   Marcy Siren, D.O. 09/12/2017, 2:46 PM PGY-3, [facility]

## 2017-09-20 DIAGNOSIS — M79672 Pain in left foot: Secondary | ICD-10-CM | POA: Diagnosis not present

## 2017-09-20 DIAGNOSIS — M722 Plantar fascial fibromatosis: Secondary | ICD-10-CM | POA: Diagnosis not present

## 2017-09-21 ENCOUNTER — Encounter: Payer: Self-pay | Admitting: Internal Medicine

## 2017-09-21 ENCOUNTER — Non-Acute Institutional Stay: Payer: Medicare Other | Admitting: Internal Medicine

## 2017-09-21 DIAGNOSIS — M722 Plantar fascial fibromatosis: Secondary | ICD-10-CM | POA: Insufficient documentation

## 2017-09-21 DIAGNOSIS — R3915 Urgency of urination: Secondary | ICD-10-CM

## 2017-09-21 DIAGNOSIS — R05 Cough: Secondary | ICD-10-CM | POA: Diagnosis not present

## 2017-09-21 DIAGNOSIS — R059 Cough, unspecified: Secondary | ICD-10-CM

## 2017-09-21 DIAGNOSIS — I10 Essential (primary) hypertension: Secondary | ICD-10-CM

## 2017-09-21 DIAGNOSIS — N401 Enlarged prostate with lower urinary tract symptoms: Secondary | ICD-10-CM

## 2017-09-21 DIAGNOSIS — H3530 Unspecified macular degeneration: Secondary | ICD-10-CM

## 2017-09-21 DIAGNOSIS — M48061 Spinal stenosis, lumbar region without neurogenic claudication: Secondary | ICD-10-CM | POA: Diagnosis not present

## 2017-09-21 NOTE — Progress Notes (Signed)
Location:  [facility] Room Number: 4 Place of Service:  ALF [phone]) Provider:  Oneal Grout MD  Oneal Grout, MD  Patient Care Team: Oneal Grout, MD as PCP - General (Internal Medicine) Shelva Majestic, [facility], Man X, NP as Nurse Practitioner (Nurse Practitioner) Lyn Records, MD as Consulting Physician (Cardiology) Barron Alvine, MD as Consulting Physician (Urology) Francisca December., MD as Attending Physician (Cardiology) Leta Speller, MD as Consulting Physician (Dermatology) Elise Benne, MD as Consulting Physician (Ophthalmology)  Extended Emergency Contact Information Primary Emergency Contact: Mortimore,Janet Address: [address]          [place], [place] [address] [place] of [place] Home Phone: [phone] Mobile Phone: [phone] Relation: Daughter  Code Status:  DNR  Goals of care: Advanced Directive information Advanced Directives 09/21/2017  Does Patient Have a Medical Advance Directive? Yes  Type of Estate agent of [place];Living will;Out of facility DNR (pink MOST or yellow form)  Does patient want to make changes to medical advance directive? No - Patient declined  Copy of Healthcare Power of Attorney in Chart? Yes  Pre-existing out of facility DNR order (yellow form or pink MOST form) Yellow form placed in chart (order not valid for inpatient use);Pink MOST form placed in chart (order not valid for inpatient use)     Chief Complaint  Patient presents with  . Medical Management of Chronic Issues    Routine Visit     HPI:  Pt is a 84 y.o. male seen today for medical management of chronic diseases.   Left heel fascitis- He got steroid injection to left heel for plantar fascitis yesterday. He feels this has helped with his pain.   Cough- He complaints of cough mostly with meals and brings up clear phlegm. Denies any dyspnea.   Hypertension- one SBP of 170, otherwise all below 130. Takes  lisinopril 20 mg daily, metoprolol tartrate 25 mg bid, lasix 20 mg daily with prn as well and baby aspirin.  BPH with urinary symptom- has urgency, takes finasteride 5 mg daily and flomax 0.4 mg daily  Chronic pain- with spinal stenosis. taking methadone 5 mg am and 7.5 mg qhs, naproxen 220 mg bid  Macular degeneration- followed by ophthalmology, was getting injection to both eyes until 03/2017.  Past Medical History:  Diagnosis Date  . Abnormality of gait 06/11/2012  . Alcohol abuse 04/26/2012  . Allergy, unspecified not elsewhere classified 04/28/2012  . Atrial fibrillation (HCC) 04/28/2012  . BPH (benign prostatic hyperplasia)   . CAD (coronary artery disease)    s/p CABG 2005  . Cardiac pacemaker in situ 04/28/2012  . CHF (congestive heart failure) (HCC) 04/23/2012  . Chronic airway obstruction, not elsewhere classified 04/26/2012  . Congestive heart failure, unspecified 04/28/2012  . Coronary atherosclerosis of native coronary artery 04/28/2012  . Degenerative joint disease 04/21/2012  . Depression    mild per pt  . Edema 04/28/2012  . Essential hypertension 10/17/2012  . Fall 04/21/2012   Prolonged downtime 04/20/2012   . GERD (gastroesophageal reflux disease) 09/13/2012  . Hip pain, right 09/02/2013  . History of rhabdomyolysis   . HTN (hypertension)   . Hyperlipidemia   . Hypertrophy of prostate without urinary obstruction and other lower urinary tract symptoms (LUTS) 04/28/2012  . Ingrowing nail 06/11/2012  . Insomnia 11/15/2012  . Insomnia, unspecified 04/28/2012  . Lumbago   . Macular degeneration   . Major depressive disorder, single episode, unspecified 04/28/2012  . Muscle weakness (generalized) 04/26/2012  .  NSTEMI (non-ST elevated myocardial infarction) (HCC) 04/23/2012   Related to the stress of rhabdomyolysis and prolonged downtime prior to admission in a patient with known coronary artery disease   . Osteoarthritis   . Other specified disease of white blood cells 04/26/2012  .  Pacemaker 04/21/2012   Near EOL.   . Paroxysmal atrial tachycardia (HCC) 04/21/2012   Brief episodes. Not felt to be an anticoagulation candidate because of age and frailty   . Pericarditis 04/21/2012  . Personal history of fall 04/28/2012  . Reflux esophagitis 04/28/2012  . Rhabdomyolysis   . Seborrheic eczema 12/03/2012  . Sick sinus syndrome (HCC) 10/14/2003; 10/29/2012   MDT EnPulse implanted by Dr Amil Amen for SSS; generator change 10/29/2012 by Dr Johney Frame MDT Jana Half pacemaker  . Sporotrichosis    remote  . Synovial cyst of wrist 11/10/2014   Left wrist    . Tachy-brady syndrome (HCC) 04/21/2012   Pacemaker is near EOL   . Unspecified constipation 09/13/2012  . Unspecified glaucoma(365.9) 04/28/2012   Past Surgical History:  Procedure Laterality Date  . ANGIOPLASTY  9/11/190   Dr. Katrinka Blazing  . APPENDECTOMY    . BACK SURGERY  03/11/2001  . bilateral knee arthroplasty  09/28/83; 05/17/1992   right then left  . CARPAL TUNNEL RELEASE Left   . CARPAL TUNNEL RELEASE Right 2007  . CATARACT EXTRACTION Right 10/30/1996  . CORONARY ARTERY BYPASS GRAFT  2005  . CYSTOSCOPY  08/14/85  . ELBOW SURGERY     repair  . EPIDIDYMIS SURGERY  09/17/1985  . HERNIA REPAIR Right 05/19/1976  . PACEMAKER GENERATOR CHANGE  10/29/12   Dr. Johney Frame  . PACEMAKER GENERATOR CHANGE N/A 10/29/2012   Procedure: PACEMAKER GENERATOR CHANGE;  Surgeon: Hillis Range, MD;  Location: [facility] CATH LAB;  Service: Cardiovascular;  Laterality: N/A;  . PACEMAKER INSERTION  10/14/2003;10/29/2012   MDT Implanted by Dr Amil Amen for SSS; generator change 10/29/2012 by Dr Johney Frame [employer] [place]  . REPLACEMENT TOTAL KNEE BILATERAL  1992 and 1996  . ROTATOR CUFF REPAIR Right 2008  . SHOULDER OPEN ROTATOR CUFF REPAIR Left 02/07/1996  . SIGMOIDOSCOPY  08/26/2001  . TOE SURGERY Right 2005   correction  . TRANSURETHRAL RESECTION OF PROSTATE  07/27/1983  . YAG LASER APPLICATION Right 12/26/1973   eye  . YAG LASER APPLICATION Right 04/30/2001  . YAG LASER APPLICATION  Left 05/07/2001    Allergies  Allergen Reactions  . Aleve [Naproxen Sodium]   . Celebrex [Celecoxib]     Lips swelled  . Sulfur     Lips swelled  . Vioxx [Rofecoxib] Other (See Comments)    unknown    Outpatient Encounter Medications as of 09/21/2017  Medication Sig  . acetaminophen (TYLENOL) 325 MG tablet Take 650 mg by mouth every 4 (four) hours as needed.  Marland Kitchen amoxicillin (AMOXIL) 500 MG tablet Take 500 mg by mouth. Take one tablet one hour prior to surgery  . aspirin 81 MG tablet Take 81 mg by mouth daily.  . bimatoprost (LUMIGAN) 0.01 % SOLN Place 1 drop into both eyes at bedtime.   . carbamide peroxide (DEBROX) 6.5 % otic solution 5 drops into affected ear for 3 nights as needed for ear wax at bedtime  . Chlorhexidine Gluconate (PERIDEX MT) Use as directed in the mouth or throat as needed (10 cc).  . Dentifrices (MI PASTE) PSTE Place 1 application onto teeth at bedtime as needed.  . diclofenac sodium (VOLTAREN) 1 % GEL Apply four times a day as needed for pain  .  finasteride (PROSCAR) 5 MG tablet Take one tablet by mouth once daily  . fluticasone (FLONASE) 50 MCG/ACT nasal spray Place 1 spray into both nostrils daily.   . furosemide (LASIX) 20 MG tablet Take 20 mg by mouth daily.  . furosemide (LASIX) 20 MG tablet Take 20 mg by mouth 2 (two) times daily as needed for fluid or edema (for increased weight of 2-3 lbs).  Marland Kitchen guaiFENesin (MUCINEX) 600 MG 12 hr tablet Take 600 mg by mouth 2 (two) times daily.   Marland Kitchen HYDROCORTISONE EX Apply 1 application topically 2 (two) times daily as needed. For itching  . hydroxypropyl methylcellulose / hypromellose (ISOPTO TEARS / GONIOVISC) 2.5 % ophthalmic solution Place 1 drop into both eyes 4 (four) times daily as needed (macular degeneration).  Marland Kitchen KLOR-CON M10 10 MEQ tablet Take 1 tablet by mouth twice daily  as needed with Lasix  . lidocaine (ASPERCREME W/LIDOCAINE) 4 % cream Apply morning and evening to painful area of the lower back  . lisinopril  (PRINIVIL,ZESTRIL) 20 MG tablet Take one tablet daily for blood pressure  . loratadine (CLARITIN) 10 MG tablet Take 10 mg by mouth daily. Reported on 07/13/2015  . magnesium hydroxide (MILK OF MAGNESIA) 400 MG/5ML suspension Take 30 mLs by mouth as needed for mild constipation.  . methadone (DOLOPHINE) 5 MG tablet Take one tablet by mouth every morning for pain; Take One and Half tablet (7.5mg ) by mouth every evening for pain.  . metoprolol tartrate (LOPRESSOR) 25 MG tablet Take 25 mg by mouth 2 (two) times daily.  . Multiple Vitamins-Minerals (ICAPS PO) Take 1 tablet by mouth daily.   . naproxen sodium (ANAPROX) 220 MG tablet Take 220 mg by mouth 2 (two) times daily.  . nitroGLYCERIN (NITROSTAT) 0.4 MG SL tablet Place 0.4 mg under the tongue every 5 (five) minutes as needed. Reported on 07/13/2015  . omeprazole (PRILOSEC) 20 MG capsule Take 20 mg by mouth daily.  Bertram Gala Glycol-Propyl Glycol 0.4-0.3 % SOLN Apply 1 drop to eye 2 (two) times daily. Both eyes  . polyethylene glycol (MIRALAX / GLYCOLAX) packet Take 17 g by mouth daily.   . sennosides-docusate sodium (SENOKOT-S) 8.6-50 MG tablet Take 1 tablet by mouth at bedtime.  . sertraline (ZOLOFT) 25 MG tablet Take 25 mg by mouth at bedtime.  . sodium chloride (OCEAN) 0.65 % SOLN nasal spray Place 1 spray into both nostrils 3 (three) times daily.   . Tamsulosin HCl (FLOMAX) 0.4 MG CAPS Take 0.4 mg by mouth daily.  Marland Kitchen zinc oxide (BALMEX) 11.3 % CREA cream Apply 1 application topically 3 (three) times daily as needed.   . zolpidem (AMBIEN) 5 MG tablet Take one tablet by mouth at bedtime for rest  . [DISCONTINUED] Polyvinyl Alcohol (LIQUID TEARS OP) Apply to eye 4 (four) times daily as needed.  . [DISCONTINUED] furosemide (LASIX) 20 MG tablet Take one tablet by mouth once daily. One twice weekly as needed for increased weight 2-3 lbs  . [DISCONTINUED] Iodoquinol-HC (DERMAZENE) 1 % CREA Apply 1 application topically 2 (two) times daily as needed.    . [DISCONTINUED] senna-docusate (SENOKOT-S) 8.6-50 MG tablet Take 1 tablet by mouth at bedtime.   . [DISCONTINUED] Silver (ALLEVYN AG GENTLE) 4"X4" PADS Apply topically daily as needed (Replace as needed). Silver-sulfadiaz-foam bandage)   No facility-administered encounter medications on file as of 09/21/2017.     Review of Systems  Constitutional: Negative for appetite change, chills and fever.  HENT: Positive for hearing loss. Negative for congestion, rhinorrhea,  sore throat and trouble swallowing.   Eyes: Positive for visual disturbance.  Respiratory: Positive for cough. Negative for shortness of breath and wheezing.   Cardiovascular: Negative for chest pain, palpitations and leg swelling.  Gastrointestinal: Negative for abdominal pain, blood in stool, constipation, diarrhea, nausea and vomiting.  Genitourinary: Positive for urgency. Negative for dysuria.  Musculoskeletal: Positive for arthralgias, back pain and gait problem.       No fall reported  Skin: Negative for wound.  Neurological: Negative for dizziness and headaches.  Hematological: Bruises/bleeds easily.  Psychiatric/Behavioral: Negative for behavioral problems, confusion and sleep disturbance.    Immunization History  Administered Date(s) Administered  . Influenza Whole 02/11/2013  . Influenza-Unspecified 02/18/2009, 02/14/2012, 01/29/2014, 01/28/2015, 02/03/2016, 01/31/2017  . PPD Test 12/18/2000, 04/25/2012, 05/09/2012  . Pneumococcal Conjugate-13 07/31/2003  . Pneumococcal Polysaccharide-23 04/24/2009  . Tdap 04/21/2012  . Zoster 07/31/2006, 04/25/2007   Pertinent  Health Maintenance Due  Topic Date Due  . INFLUENZA VACCINE  11/22/2017  . PNA vac Low Risk Adult  Completed   Fall Risk  12/11/2016 12/14/2015 07/13/2015 12/15/2014 08/04/2014  Falls in the past year? No Yes No No No  Comment - 12/11/15 - - -  Number falls in past yr: - 1 - - -  Injury with Fall? - No - - -  Risk for fall due to : - - - - History of  fall(s);Impaired balance/gait   Functional Status Survey:    Vitals:   09/21/17 1325  BP: (!) 132/58  Pulse: 62  Resp: 18  Temp: (!) 96.6 F (35.9 C)  TempSrc: Oral  SpO2: 98%  Weight: 180 lb 9.6 oz (81.9 kg)  Height: 5\' 8"  (1.727 m)   Body mass index is 27.46 kg/m.   Wt Readings from Last 3 Encounters:  09/21/17 180 lb 9.6 oz (81.9 kg)  07/06/17 178 lb (80.7 kg)  07/02/17 181 lb (82.1 kg)   Physical Exam  Constitutional: He is oriented to person, place, and time. He appears well-developed and well-nourished. No distress.  HENT:  Head: Normocephalic and atraumatic.  Right Ear: External ear normal.  Left Ear: External ear normal.  Nose: Nose normal.  Mouth/Throat: Oropharynx is clear and moist. No oropharyngeal exudate.  Eyes: Pupils are equal, round, and reactive to light. Conjunctivae and EOM are normal. Right eye exhibits no discharge. Left eye exhibits no discharge.  Has corrective glasses  Neck: Normal range of motion. Neck supple.  Cardiovascular: Normal rate and regular rhythm.  Pulmonary/Chest: Effort normal and breath sounds normal. He has no wheezes. He has no rales.  Abdominal: Soft. Bowel sounds are normal. There is no tenderness.  Musculoskeletal:  Able to move all 4 extremities, no spinal tenderness, has arthritis changes, unsteady gait and uses walker and wheelchair for ambulation  Lymphadenopathy:    He has no cervical adenopathy.  Neurological: He is alert and oriented to person, place, and time.  Skin: Skin is warm and dry. He is not diaphoretic.  Psychiatric: He has a normal mood and affect. His behavior is normal.    Labs reviewed: Recent Labs    03/22/17 06/18/17  NA 136  136 136*  136  K 4.5  4.5 4.5  4.5  BUN 40*  40* 38*  CREATININE 1.40  1.40 1.5*  1.46  CALCIUM 9.4  9.4 9.2   Recent Labs    03/22/17 06/18/17  AST 7  7 14  14   ALT 11  11 12  12   ALKPHOS 67  67  69  69  BILITOT 0.6  0.6 0.5  PROT 6.6  6.6 6.4    ALBUMIN 3.9  3.9 3.9   Recent Labs    03/22/17 06/18/17  WBC 5.8 5.4  5.4  HGB 11.8 11.6*  11.6  HCT 34 33*  33  PLT  --  123*   Lab Results  Component Value Date   TSH 4.53 06/21/2017   TSH 4.53 06/21/2017   Lab Results  Component Value Date   HGBA1C 6.1 (H) 04/22/2012   Lab Results  Component Value Date   CHOL 149 04/22/2012   HDL 35 (L) 04/22/2012   LDLCALC 96 04/22/2012   TRIG 89 04/22/2012   CHOLHDL 4.3 04/22/2012    Significant Diagnostic Results in last 30 days:  No results found.  Assessment/Plan  1. Essential hypertension Overall stable, continue current regimen as above. Monitor BP and BMP.   2. Spinal stenosis of lumbar region, unspecified whether neurogenic claudication present Continue current pain regimen. Fall precautions.   3. Macular degeneration of both eyes, unspecified type Continue f/u with ophthalmology.   4. Cough SLP consult with coughing with meals. Small bites, sip of water in between, aspiration precautions.   5. Plantar fasciitis, left S/p steroid injection with improvement of pain. Monitor clinically. Pt to work with PT.   6. Benign prostatic hyperplasia (BPH) with urinary urgency Continue flomax and proscar   Family/ staff Communication: reviewed care plan with patient and charge nurse.    Labs/tests ordered:  SLP consult, PT consult   Oneal Grout, MD Internal Medicine [facility] [address] [place], [place] [address] Cell Phone (Monday-Friday 8 am - 5 pm): [phone] On Call: [phone] and follow prompts after 5 pm and on weekends Office Phone: [phone] Office Fax: [phone]

## 2017-09-25 ENCOUNTER — Non-Acute Institutional Stay: Payer: Medicare Other | Admitting: Family

## 2017-09-25 ENCOUNTER — Encounter: Payer: Self-pay | Admitting: Family

## 2017-09-25 DIAGNOSIS — R059 Cough, unspecified: Secondary | ICD-10-CM

## 2017-09-25 DIAGNOSIS — R05 Cough: Secondary | ICD-10-CM | POA: Diagnosis not present

## 2017-09-25 NOTE — Progress Notes (Signed)
Location:  [facility] Room Number: 4 Place of Service:  ALF [phone]) Provider: Dinah Ngetich FNP-C  Oneal Grout, MD  Patient Care Team: Oneal Grout, MD as PCP - General (Internal Medicine) Shelva Majestic, [facility], Man X, NP as Nurse Practitioner (Nurse Practitioner) Lyn Records, MD as Consulting Physician (Cardiology) Barron Alvine, MD as Consulting Physician (Urology) Francisca December., MD as Attending Physician (Cardiology) Leta Speller, MD as Consulting Physician (Dermatology) Elise Benne, MD as Consulting Physician (Ophthalmology)  Extended Emergency Contact Information Primary Emergency Contact: Mortimore,Janet Address: [address]          [place], [place] [address] [place] Home Phone: [phone] Mobile Phone: [phone] Relation: Daughter  Code Status:  DNR Goals of care: Advanced Directive information Advanced Directives 09/25/2017  Does Patient Have a Medical Advance Directive? Yes  Type of Estate agent of [place];Out of facility DNR (pink MOST or yellow form);Living will  Does patient want to make changes to medical advance directive? -  Copy of Healthcare Power of Attorney in Chart? Yes  Pre-existing out of facility DNR order (yellow form or pink MOST form) Yellow form placed in chart (order not valid for inpatient use);Pink MOST form placed in chart (order not valid for inpatient use)     Chief Complaint  Patient presents with  . Acute Visit    cough    HPI:  Pt is a 84 y.o. male seen today at [facility] for an acute visit for evaluation of worsening cough.He is seen in his room today per facility charge Nurse request.Nurse reports patient waking up at night with cough and congestion that has worsen for the past three days.patient states coughing up thick yellow phlegm.Also states coughing during meals.chart reviewed noted order by MD on 09/21/2017 for speech therapy to evaluate  for high risk for aspiration.Patient denies any fever,chills,sweating,shortness of breath or wheezing.He further denies any feelings of acid reflux,chest pain or nasal drainage.He is currently on Mucinex twice daily,loratadine and fluticasone nasal spray.      Past Medical History:  Diagnosis Date  . Abnormality of gait 06/11/2012  . Alcohol abuse 04/26/2012  . Allergy, unspecified not elsewhere classified 04/28/2012  . Atrial fibrillation (HCC) 04/28/2012  . BPH (benign prostatic hyperplasia)   . CAD (coronary artery disease)    s/p CABG 2005  . Cardiac pacemaker in situ 04/28/2012  . CHF (congestive heart failure) (HCC) 04/23/2012  . Chronic airway obstruction, not elsewhere classified 04/26/2012  . Congestive heart failure, unspecified 04/28/2012  . Coronary atherosclerosis of native coronary artery 04/28/2012  . Degenerative joint disease 04/21/2012  . Depression    mild per pt  . Edema 04/28/2012  . Essential hypertension 10/17/2012  . Fall 04/21/2012   Prolonged downtime 04/20/2012   . GERD (gastroesophageal reflux disease) 09/13/2012  . Hip pain, right 09/02/2013  . History of rhabdomyolysis   . HTN (hypertension)   . Hyperlipidemia   . Hypertrophy of prostate without urinary obstruction and other lower urinary tract symptoms (LUTS) 04/28/2012  . Ingrowing nail 06/11/2012  . Insomnia 11/15/2012  . Insomnia, unspecified 04/28/2012  . Lumbago   . Macular degeneration   . Major depressive disorder, single episode, unspecified 04/28/2012  . Muscle weakness (generalized) 04/26/2012  . NSTEMI (non-ST elevated myocardial infarction) (HCC) 04/23/2012   Related to the stress of rhabdomyolysis and prolonged downtime prior to admission in a patient with known coronary artery disease   . Osteoarthritis   . Other  specified disease of white blood cells 04/26/2012  . Pacemaker 04/21/2012   Near EOL.   . Paroxysmal atrial tachycardia (HCC) 04/21/2012   Brief episodes. Not felt to be an anticoagulation candidate  because of age and frailty   . Pericarditis 04/21/2012  . Personal history of fall 04/28/2012  . Reflux esophagitis 04/28/2012  . Rhabdomyolysis   . Seborrheic eczema 12/03/2012  . Sick sinus syndrome (HCC) 10/14/2003; 10/29/2012   MDT EnPulse implanted by Dr Amil AmenEdmunds for SSS; generator change 10/29/2012 by Dr Johney FrameAllred MDT Jana HalfSensia pacemaker  . Sporotrichosis    remote  . Synovial cyst of wrist 11/10/2014   Left wrist    . Tachy-brady syndrome (HCC) 04/21/2012   Pacemaker is near EOL   . Unspecified constipation 09/13/2012  . Unspecified glaucoma(365.9) 04/28/2012   Past Surgical History:  Procedure Laterality Date  . ANGIOPLASTY  9/11/190   Dr. Katrinka BlazingSmith  . APPENDECTOMY    . BACK SURGERY  03/11/2001  . bilateral knee arthroplasty  09/28/83; 05/17/1992   right then left  . CARPAL TUNNEL RELEASE Left   . CARPAL TUNNEL RELEASE Right 2007  . CATARACT EXTRACTION Right 10/30/1996  . CORONARY ARTERY BYPASS GRAFT  2005  . CYSTOSCOPY  08/14/85  . ELBOW SURGERY     repair  . EPIDIDYMIS SURGERY  09/17/1985  . HERNIA REPAIR Right 05/19/1976  . PACEMAKER GENERATOR CHANGE  10/29/12   Dr. Johney FrameAllred  . PACEMAKER GENERATOR CHANGE N/A 10/29/2012   Procedure: PACEMAKER GENERATOR CHANGE;  Surgeon: Hillis RangeJames Allred, MD;  [facility];  Service: Cardiovascular;  Laterality: N/A;  . PACEMAKER INSERTION  10/14/2003;10/29/2012   MDT Implanted by Dr Amil AmenEdmunds for SSS; generator change 10/29/2012 by Dr Johney FrameAllred Medtronic [employer]Sensia  . REPLACEMENT TOTAL KNEE BILATERAL  1992 and 1996  . ROTATOR CUFF REPAIR Right 2008  . SHOULDER OPEN ROTATOR CUFF REPAIR Left 02/07/1996  . SIGMOIDOSCOPY  08/26/2001  . TOE SURGERY Right 2005   correction  . TRANSURETHRAL RESECTION OF PROSTATE  07/27/1983  . YAG LASER APPLICATION Right 12/26/1973   eye  . YAG LASER APPLICATION Right 04/30/2001  . YAG LASER APPLICATION Left 05/07/2001    Allergies  Allergen Reactions  . Aleve [Naproxen Sodium]   . Celebrex [Celecoxib]     Lips swelled  . Sulfur     Lips  swelled  . Vioxx [Rofecoxib] Other (See Comments)    unknown    Outpatient Encounter Medications as of 09/25/2017  Medication Sig  . acetaminophen (TYLENOL) 325 MG tablet Take 650 mg by mouth every 4 (four) hours as needed.  Marland Kitchen. amoxicillin (AMOXIL) 500 MG tablet Take 500 mg by mouth. Take one tablet one hour prior to surgery  . aspirin 81 MG tablet Take 81 mg by mouth daily.  . bimatoprost (LUMIGAN) 0.01 % SOLN Place 1 drop into both eyes at bedtime.   . carbamide peroxide (DEBROX) 6.5 % otic solution 5 drops into affected ear for 3 nights as needed for ear wax at bedtime  . Chlorhexidine Gluconate (PERIDEX MT) Use as directed in the mouth or throat as needed (10 cc).  . Dentifrices (MI PASTE) PSTE Place 1 application onto teeth at bedtime as needed.  . diclofenac sodium (VOLTAREN) 1 % GEL Apply four times a day as needed for pain  . finasteride (PROSCAR) 5 MG tablet Take one tablet by mouth once daily  . fluticasone (FLONASE) 50 MCG/ACT nasal spray Place 1 spray into both nostrils daily.   . furosemide (LASIX) 20 MG tablet  Take 20 mg by mouth daily.  . furosemide (LASIX) 20 MG tablet Take 20 mg by mouth 2 (two) times daily as needed for fluid or edema (for increased weight of 2-3 lbs).  Marland Kitchen guaiFENesin (MUCINEX) 600 MG 12 hr tablet Take 600 mg by mouth 2 (two) times daily.   . hydroxypropyl methylcellulose / hypromellose (ISOPTO TEARS / GONIOVISC) 2.5 % ophthalmic solution Place 1 drop into both eyes 4 (four) times daily as needed (macular degeneration).  Marland Kitchen KLOR-CON M10 10 MEQ tablet Take 1 tablet by mouth twice daily  as needed with Lasix  . lidocaine (ASPERCREME W/LIDOCAINE) 4 % cream Apply morning and evening to painful area of the lower back  . lisinopril (PRINIVIL,ZESTRIL) 20 MG tablet Take one tablet daily for blood pressure  . loratadine (CLARITIN) 10 MG tablet Take 10 mg by mouth daily. Reported on 07/13/2015  . magnesium hydroxide (MILK OF MAGNESIA) 400 MG/5ML suspension Take 30 mLs by  mouth as needed for mild constipation.  . methadone (DOLOPHINE) 5 MG tablet Take one tablet by mouth every morning for pain; Take One and Half tablet (7.5mg ) by mouth every evening for pain.  . metoprolol tartrate (LOPRESSOR) 25 MG tablet Take 25 mg by mouth 2 (two) times daily.  . Multiple Vitamins-Minerals (ICAPS PO) Take 1 tablet by mouth daily.   . naproxen sodium (ANAPROX) 220 MG tablet Take 220 mg by mouth 2 (two) times daily.  . nitroGLYCERIN (NITROSTAT) 0.4 MG SL tablet Place 0.4 mg under the tongue every 5 (five) minutes as needed. Reported on 07/13/2015  . omeprazole (PRILOSEC) 20 MG capsule Take 20 mg by mouth daily.  Bertram Gala Glycol-Propyl Glycol 0.4-0.3 % SOLN Apply 1 drop to eye 2 (two) times daily. Both eyes  . polyethylene glycol (MIRALAX / GLYCOLAX) packet Take 17 g by mouth daily.   . sennosides-docusate sodium (SENOKOT-S) 8.6-50 MG tablet Take 1 tablet by mouth at bedtime.  . sertraline (ZOLOFT) 25 MG tablet Take 25 mg by mouth at bedtime.  . sodium chloride (OCEAN) 0.65 % SOLN nasal spray Place 1 spray into both nostrils 3 (three) times daily.   . Tamsulosin HCl (FLOMAX) 0.4 MG CAPS Take 0.4 mg by mouth daily.  Marland Kitchen zinc oxide (BALMEX) 11.3 % CREA cream Apply 1 application topically 3 (three) times daily as needed.   . zolpidem (AMBIEN) 5 MG tablet Take one tablet by mouth at bedtime for rest  . HYDROCORTISONE EX Apply 1 application topically 2 (two) times daily as needed. For itching   No facility-administered encounter medications on file as of 09/25/2017.     Review of Systems  Reason unable to perform ROS: additional information provided by facility charge Nurse.  Constitutional: Negative for appetite change, chills, fatigue, fever and unexpected weight change.  HENT: Negative for congestion, rhinorrhea, sinus pressure, sinus pain, sneezing and sore throat.        Chronic drooling  Eyes: Positive for visual disturbance. Negative for discharge, redness and itching.        Wears eye glasses   Respiratory: Positive for cough. Negative for chest tightness, shortness of breath and wheezing.   Cardiovascular: Positive for leg swelling. Negative for chest pain and palpitations.  Gastrointestinal: Negative for abdominal distention, abdominal pain, constipation, diarrhea, nausea and vomiting.  Skin: Negative for color change, pallor and rash.  Neurological: Negative for dizziness, light-headedness and headaches.  Psychiatric/Behavioral: Negative for agitation and sleep disturbance. The patient is not nervous/anxious.     Immunization History  Administered Date(s)  Administered  . Influenza Whole 02/11/2013  . Influenza-Unspecified 02/18/2009, 02/14/2012, 01/29/2014, 01/28/2015, 02/03/2016, 01/31/2017  . PPD Test 12/18/2000, 04/25/2012, 05/09/2012  . Pneumococcal Conjugate-13 07/31/2003  . Pneumococcal Polysaccharide-23 04/24/2009  . Tdap 04/21/2012  . Zoster 07/31/2006, 04/25/2007   Pertinent  Health Maintenance Due  Topic Date Due  . INFLUENZA VACCINE  11/22/2017  . PNA vac Low Risk Adult  Completed   Fall Risk  12/11/2016 12/14/2015 07/13/2015 12/15/2014 08/04/2014  Falls in the past year? No Yes No No No  Comment - 12/11/15 - - -  Number falls in past yr: - 1 - - -  Injury with Fall? - No - - -  Risk for fall due to : - - - - History of fall(s);Impaired balance/gait    Vitals:   09/25/17 1048  BP: 136/62  Pulse: 68  Resp: 19  Temp: 98.6 F (37 C)  SpO2: 95%  Weight: 180 lb 9.6 oz (81.9 kg)  Height: 5\' 8"  (1.727 m)   Body mass index is 27.46 kg/m. Physical Exam  Constitutional: He is oriented to person, place, and time.  Frail elderly in no acute distress   HENT:  Head: Normocephalic.  Right Ear: External ear normal.  Left Ear: External ear normal.  Mouth/Throat: Oropharynx is clear and moist. No oropharyngeal exudate.  Eyes: Pupils are equal, round, and reactive to light. Conjunctivae and EOM are normal. Right eye exhibits no discharge. Left  eye exhibits no discharge. No scleral icterus.  Neck: Normal range of motion. No JVD present. No thyromegaly present.  Cardiovascular: Intact distal pulses. Exam reveals no gallop and no friction rub.  pacemaker  Pulmonary/Chest: Effort normal. No stridor. No respiratory distress. He has decreased breath sounds in the left upper field and the left lower field. He has no wheezes. He has rales in the right upper field, the right middle field, the right lower field and the left middle field.  Abdominal: Soft. Bowel sounds are normal. He exhibits no distension and no mass. There is no tenderness. There is no rebound and no guarding.  Musculoskeletal: He exhibits no tenderness.  Unsteady gait ambulates using FWW but spends most time on wheelchair.bilateral lower extremities edema 1+ edema.Knee high ted hose in place.   Lymphadenopathy:    He has no cervical adenopathy.  Neurological: He is oriented to person, place, and time.  Skin: Skin is warm and dry. No rash noted. No erythema. No pallor.  Psychiatric: He has a normal mood and affect. His behavior is normal. Judgment and thought content normal.  Difficult finding words at times.    Labs reviewed: Recent Labs    03/22/17 06/18/17  NA 136  136 136*  136  K 4.5  4.5 4.5  4.5  BUN 40*  40* 38*  CREATININE 1.40  1.40 1.5*  1.46  CALCIUM 9.4  9.4 9.2   Recent Labs    03/22/17 06/18/17  AST 7  7 14  14   ALT 11  11 12  12   ALKPHOS 67  67 69  69  BILITOT 0.6  0.6 0.5  PROT 6.6  6.6 6.4  ALBUMIN 3.9  3.9 3.9   Recent Labs    03/22/17 06/18/17  WBC 5.8 5.4  5.4  HGB 11.8 11.6*  11.6  HCT 34 33*  33  PLT  --  123*   Lab Results  Component Value Date   TSH 4.53 06/21/2017   TSH 4.53 06/21/2017   Lab Results  Component Value Date  HGBA1C 6.1 (H) 04/22/2012   Lab Results  Component Value Date   CHOL 149 04/22/2012   HDL 35 (L) 04/22/2012   LDLCALC 96 04/22/2012   TRIG 89 04/22/2012   CHOLHDL 4.3 04/22/2012      Significant Diagnostic Results in last 30 days:  No results found.  Assessment/Plan  Cough/congestion Afebrile.No abrupt weight gain or worsening edema.Has worsen for the past three days with thick green-yellowish sputum.Right lung and Left lower lobe rales.High risk for aspiration due to coughing during meals.continue with speech therapy ordered 09/21/2017 by MD.Obtain portable CXR Pa/lat to rule out for pneumonia.Portable due to high risk for falls. Continue on Mucinex 600 mg tablet twice daily.continue to monitor.   Family/ staff Communication: Reviewed plan of care with patient and facility charge Nurse and Nurse supervisor  Labs/tests ordered: Portable CXR Pa/lat to rule out for pneumonia.Portable due to high risk for falls. Caesar Bookman, NP

## 2017-09-28 DIAGNOSIS — M79671 Pain in right foot: Secondary | ICD-10-CM | POA: Diagnosis not present

## 2017-09-28 DIAGNOSIS — M79672 Pain in left foot: Secondary | ICD-10-CM | POA: Diagnosis not present

## 2017-09-28 DIAGNOSIS — B351 Tinea unguium: Secondary | ICD-10-CM | POA: Diagnosis not present

## 2017-10-12 DIAGNOSIS — M79672 Pain in left foot: Secondary | ICD-10-CM | POA: Diagnosis not present

## 2017-10-12 DIAGNOSIS — M722 Plantar fascial fibromatosis: Secondary | ICD-10-CM | POA: Diagnosis not present

## 2017-10-19 ENCOUNTER — Encounter: Payer: Self-pay | Admitting: Internal Medicine

## 2017-10-19 ENCOUNTER — Other Ambulatory Visit: Payer: Self-pay

## 2017-10-19 ENCOUNTER — Non-Acute Institutional Stay: Payer: Medicare Other | Admitting: Internal Medicine

## 2017-10-19 DIAGNOSIS — R29898 Other symptoms and signs involving the musculoskeletal system: Secondary | ICD-10-CM

## 2017-10-19 DIAGNOSIS — L304 Erythema intertrigo: Secondary | ICD-10-CM | POA: Diagnosis not present

## 2017-10-19 DIAGNOSIS — Z741 Need for assistance with personal care: Secondary | ICD-10-CM

## 2017-10-19 DIAGNOSIS — R159 Full incontinence of feces: Secondary | ICD-10-CM

## 2017-10-19 MED ORDER — METHADONE HCL 5 MG PO TABS: ORAL_TABLET | ORAL | 0 refills | Status: DC

## 2017-10-19 NOTE — Progress Notes (Signed)
Location:  [facility] Room Number: 4 Place of Service:  ALF [phone]) Provider:  Blanchie Serve, MD  Blanchie Serve, MD  Patient Care Team: Blanchie Serve, MD as PCP - General (Internal Medicine) Melina Modena, [facility], Man X, NP as Nurse Practitioner (Nurse Practitioner) Belva Crome, MD as Consulting Physician (Cardiology) Rana Snare, MD as Consulting Physician (Urology) Barnett Abu., MD as Attending Physician (Cardiology) Lindwood Coke, MD as Consulting Physician (Dermatology) Sharyne Peach, MD as Consulting Physician (Ophthalmology)  Extended Emergency Contact Information Primary Emergency Contact: Mortimore,Janet Address: [address]          [place], [place] [address] Johnnette Litter of [place] Phone: [phone] Mobile Phone: [phone] Relation: Daughter  Code Status:  DNR  Goals of care: Advanced Directive information Advanced Directives 10/19/2017  Does Patient Have a Medical Advance Directive? Yes  Type of Paramedic of [place];Living will;Out of facility DNR (pink MOST or yellow form)  Does patient want to make changes to medical advance directive? No - Patient declined  Copy of [place] in Chart? Yes  Pre-existing out of facility DNR order (yellow form or pink MOST form) Yellow form placed in chart (order not valid for inpatient use);Pink MOST form placed in chart (order not valid for inpatient use)     Chief Complaint  Patient presents with  . Acute Visit    incontinence, decline    HPI:  Pt is a 84 y.o. male seen today for an acute visit for urinary and bowel incontinence and ongoing decline. Per nursing, he was struggling with constipation having and having abdominal pain until last week. After getting milk of magnesia and miralax besides his scheduled senokot s and miralax, he started having loose stool. He was noted to have episodes of bowel and bladder incontinence. He  was requiring help going to the toilet which is new for him. He is seen in his room today with charge nurse present. He denies any further loose stool but has accidents with urine if nurses are late to come. He denies any new urinary frequency or urgency but feels weak to go to the bathroom by himself especially at night.  On chart review, pt had an unwitnessed fall to side of his bathroom on 10/14/17 night. No apparent injury reported. He had episodes of incontinence with bowel x 2 on 10/16/17 and 10/18/17. No loose stool reported today. Had episode of urinary incontinence x 1 today per nursing note.    Past Medical History:  Diagnosis Date  . Abnormality of gait 06/11/2012  . Alcohol abuse 04/26/2012  . Allergy, unspecified not elsewhere classified 04/28/2012  . Atrial fibrillation ([place]) 04/28/2012  . BPH (benign prostatic hyperplasia)   . CAD (coronary artery disease)    s/p CABG 2005  . Cardiac pacemaker in situ 04/28/2012  . CHF (congestive heart failure) ([place]) 04/23/2012  . Chronic airway obstruction, not elsewhere classified 04/26/2012  . Congestive heart failure, unspecified 04/28/2012  . Coronary atherosclerosis of native coronary artery 04/28/2012  . Degenerative joint disease 04/21/2012  . Depression    mild per pt  . Edema 04/28/2012  . Essential hypertension 10/17/2012  . Fall 04/21/2012   Prolonged downtime 04/20/2012   . GERD (gastroesophageal reflux disease) 09/13/2012  . Hip pain, right 09/02/2013  . History of rhabdomyolysis   . HTN (hypertension)   . Hyperlipidemia   . Hypertrophy of prostate without urinary obstruction and other lower urinary tract symptoms (LUTS) 04/28/2012  .  Ingrowing nail 06/11/2012  . Insomnia 11/15/2012  . Insomnia, unspecified 04/28/2012  . Lumbago   . Macular degeneration   . Major depressive disorder, single episode, unspecified 04/28/2012  . Muscle weakness (generalized) 04/26/2012  . NSTEMI (non-ST elevated myocardial infarction) ([place]) 04/23/2012   Related to the  stress of rhabdomyolysis and prolonged downtime prior to admission in a patient with known coronary artery disease   . Osteoarthritis   . Other specified disease of white blood cells 04/26/2012  . Pacemaker 04/21/2012   Near EOL.   . Paroxysmal atrial tachycardia ([place]) 04/21/2012   Brief episodes. Not felt to be an anticoagulation candidate because of age and frailty   . Pericarditis 04/21/2012  . Personal history of fall 04/28/2012  . Reflux esophagitis 04/28/2012  . Rhabdomyolysis   . Seborrheic eczema 12/03/2012  . Sick sinus syndrome ([place]) 10/14/2003; 10/29/2012   MDT EnPulse implanted by Dr Leonia Reeves for SSS; generator change 10/29/2012 by Dr Rayann Heman MDT Sherril Croon pacemaker  . Sporotrichosis    remote  . Synovial cyst of wrist 11/10/2014   Left wrist    . Tachy-brady syndrome ([place]) 04/21/2012   Pacemaker is near EOL   . Unspecified constipation 09/13/2012  . Unspecified glaucoma(365.9) 04/28/2012   Past Surgical History:  Procedure Laterality Date  . ANGIOPLASTY  9/11/190   Dr. Tamala Julian  . APPENDECTOMY    . BACK SURGERY  03/11/2001  . bilateral knee arthroplasty  09/28/83; 05/17/1992   right then left  . CARPAL TUNNEL RELEASE Left   . CARPAL TUNNEL RELEASE Right 2007  . CATARACT EXTRACTION Right 10/30/1996  . CORONARY ARTERY BYPASS GRAFT  2005  . CYSTOSCOPY  08/14/85  . ELBOW SURGERY     repair  . EPIDIDYMIS SURGERY  09/17/1985  . HERNIA REPAIR Right 05/19/1976  . PACEMAKER GENERATOR CHANGE  10/29/12   Dr. Rayann Heman  . PACEMAKER GENERATOR CHANGE N/A 10/29/2012   Procedure: PACEMAKER GENERATOR CHANGE;  Surgeon: Thompson Grayer, MD;  Location[facility] C[facility] LAB;  Service: Cardiovascular;  Laterality: N/A;  . PACEMAKER INSERTION  10/14/2003;10/29/2012   MDT Implanted by Dr Leonia Reeves for SSS; generator change 10/29/2012 by Dr Rayann Heman [employer] [place]  . REPLACEMENT TOTAL KNEE BILATERAL  1992 and 1996  . ROTATOR CUFF REPAIR Right 2008  . SHOULDER OPEN ROTATOR CUFF REPAIR Left 02/07/1996  . SIGMOIDOSCOPY  08/26/2001  . TOE  SURGERY Right 2005   correction  . TRANSURETHRAL RESECTION OF PROSTATE  07/27/1983  . YAG LASER APPLICATION Right 12/29/4161   eye  . YAG LASER APPLICATION Right 11/26/5362  . YAG LASER APPLICATION Left 6/80/3212    Allergies  Allergen Reactions  . Aleve [Naproxen Sodium]   . Celebrex [Celecoxib]     Lips swelled  . Sulfur     Lips swelled  . Vioxx [Rofecoxib] Other (See Comments)    unknown    Outpatient Encounter Medications as of 10/19/2017  Medication Sig  . acetaminophen (TYLENOL) 325 MG tablet Take 650 mg by mouth every 4 (four) hours as needed.  Marland Kitchen amoxicillin (AMOXIL) 500 MG tablet Take 500 mg by mouth. Take one tablet one hour prior to surgery  . aspirin 81 MG tablet Take 81 mg by mouth daily.  . bimatoprost (LUMIGAN) 0.01 % SOLN Place 1 drop into both eyes at bedtime.   . carbamide peroxide (DEBROX) 6.5 % otic solution 5 drops into affected ear for 3 nights as needed for ear wax at bedtime  . Chlorhexidine Gluconate (PERIDEX MT) Use as directed in the mouth  or throat as needed (10 cc).  . Dentifrices (MI PASTE) PSTE Place 1 application onto teeth at bedtime as needed.  . diclofenac sodium (VOLTAREN) 1 % GEL Apply four times a day as needed for pain  . finasteride (PROSCAR) 5 MG tablet Take one tablet by mouth once daily  . fluticasone (FLONASE) 50 MCG/ACT nasal spray Place 1 spray into both nostrils daily.   . furosemide (LASIX) 20 MG tablet Take 20 mg by mouth daily.  . furosemide (LASIX) 20 MG tablet Take 20 mg by mouth 2 (two) times daily as needed for fluid or edema (for increased weight of 2-3 lbs).  Marland Kitchen guaiFENesin (MUCINEX) 600 MG 12 hr tablet Take 600 mg by mouth 2 (two) times daily.   Marland Kitchen HYDROCORTISONE EX Apply 1 application topically 2 (two) times daily as needed. For itching  . hydroxypropyl methylcellulose / hypromellose (ISOPTO TEARS / GONIOVISC) 2.5 % ophthalmic solution Place 1 drop into both eyes 4 (four) times daily as needed (macular degeneration).  Marland Kitchen KLOR-CON  M10 10 MEQ tablet Take 1 tablet by mouth twice daily  as needed with Lasix  . lidocaine (ASPERCREME W/LIDOCAINE) 4 % cream Apply morning and evening to painful area of the lower back  . lisinopril (PRINIVIL,ZESTRIL) 20 MG tablet Take one tablet daily for blood pressure  . loratadine (CLARITIN) 10 MG tablet Take 10 mg by mouth daily. Reported on 07/13/2015  . magnesium hydroxide (MILK OF MAGNESIA) 400 MG/5ML suspension Take 30 mLs by mouth as needed for mild constipation.  . methadone (DOLOPHINE) 5 MG tablet Take one tablet by mouth every morning for pain; Take One and Half tablet (7.9m) by mouth every evening for pain.  . metoprolol tartrate (LOPRESSOR) 25 MG tablet Take 25 mg by mouth 2 (two) times daily.  . Multiple Vitamins-Minerals (ICAPS PO) Take 1 tablet by mouth daily.   . naproxen sodium (ANAPROX) 220 MG tablet Take 220 mg by mouth 2 (two) times daily.  . nitroGLYCERIN (NITROSTAT) 0.4 MG SL tablet Place 0.4 mg under the tongue every 5 (five) minutes as needed. Reported on 07/13/2015  . omeprazole (PRILOSEC) 20 MG capsule Take 20 mg by mouth daily.  .Vladimir FasterGlycol-Propyl Glycol 0.4-0.3 % SOLN Apply 1 drop to eye 2 (two) times daily. Both eyes  . polyethylene glycol (MIRALAX / GLYCOLAX) packet Take 17 g by mouth daily.   . sennosides-docusate sodium (SENOKOT-S) 8.6-50 MG tablet Take 1 tablet by mouth at bedtime.  . sertraline (ZOLOFT) 25 MG tablet Take 25 mg by mouth at bedtime.  . sodium chloride (OCEAN) 0.65 % SOLN nasal spray Place 1 spray into both nostrils 3 (three) times daily.   . Tamsulosin HCl (FLOMAX) 0.4 MG CAPS Take 0.4 mg by mouth daily.  .Marland Kitchenzinc oxide (BALMEX) 11.3 % CREA cream Apply 1 application topically 3 (three) times daily as needed.   . zolpidem (AMBIEN) 5 MG tablet Take one tablet by mouth at bedtime for rest   No facility-administered encounter medications on file as of 10/19/2017.     Review of Systems  Constitutional: Positive for fatigue. Negative for appetite  change, chills and fever.  HENT: Positive for hearing loss. Negative for congestion, mouth sores, rhinorrhea, sore throat and trouble swallowing.   Respiratory: Negative for cough and shortness of breath.   Cardiovascular: Negative for chest pain and palpitations.  Gastrointestinal: Negative for abdominal pain, blood in stool, constipation, diarrhea, nausea, rectal pain and vomiting.       Denies any further loose stool  Genitourinary: Negative for dysuria, flank pain, frequency, hematuria and urgency.  Musculoskeletal: Positive for arthralgias and gait problem. Negative for back pain.       Complaints of heel pain. Uses walker and wheelchair. Requiring increased assistance with ADLs compared to last week especially toileting and dressing  Neurological: Positive for weakness. Negative for dizziness and headaches.       Has reported weakness to legs  Psychiatric/Behavioral: Positive for decreased concentration. Negative for confusion.    Immunization History  Administered Date(s) Administered  . Influenza Whole 02/11/2013  . Influenza-Unspecified 02/18/2009, 02/14/2012, 01/29/2014, 01/28/2015, 02/03/2016, 01/31/2017  . PPD Test 12/18/2000, 04/25/2012, 05/09/2012  . Pneumococcal Conjugate-13 07/31/2003  . Pneumococcal Polysaccharide-23 04/24/2009  . Tdap 04/21/2012  . Zoster 07/31/2006, 04/25/2007   Pertinent  Health Maintenance Due  Topic Date Due  . INFLUENZA VACCINE  11/22/2017  . PNA vac Low Risk Adult  Completed   Fall Risk  12/11/2016 12/14/2015 07/13/2015 12/15/2014 08/04/2014  Falls in the past year? No Yes No No No  Comment - 12/11/15 - - -  Number falls in past yr: - 1 - - -  Injury with Fall? - No - - -  Risk for fall due to : - - - - History of fall(s);Impaired balance/gait   Functional Status Survey:    Vitals:   10/19/17 1225  BP: 140/63  Pulse: 70  Resp: 18  Temp: 98.7 F (37.1 C)  TempSrc: Oral  SpO2: 95%  Weight: 175 lb (79.4 kg)  Height: 5' 8"  (1.727 m)    Body mass index is 26.61 kg/m.   Wt Readings from Last 3 Encounters:  10/19/17 175 lb (79.4 kg)  09/25/17 180 lb 9.6 oz (81.9 kg)  09/21/17 180 lb 9.6 oz (81.9 kg)   Physical Exam  Constitutional: He is oriented to person, place, and time.  Well built, elderly pleasant male in no acute distress  HENT:  Head: Normocephalic and atraumatic.  Right Ear: External ear normal.  Left Ear: External ear normal.  Mouth/Throat: Oropharynx is clear and moist.  Eyes: Pupils are equal, round, and reactive to light. Conjunctivae and EOM are normal. Right eye exhibits no discharge. Left eye exhibits no discharge.  Neck: Normal range of motion. Neck supple.  Cardiovascular: Normal rate and regular rhythm.  Pulmonary/Chest: Effort normal and breath sounds normal. No respiratory distress. He has no wheezes. He has no rales.  Abdominal: Soft. Bowel sounds are normal. There is no tenderness. There is no guarding.  Genitourinary:  Genitourinary Comments: Multiple small open areas noted in skin between the buttock/ butt crack, erythema to this area is blanchable. No drainage noted  Musculoskeletal: He exhibits no edema or tenderness.  Can move all 4 extremities, adequate strength with him sitting, wheelchair and walker for ambulation, unsteady gait  Lymphadenopathy:    He has no cervical adenopathy.  Neurological: He is alert and oriented to person, place, and time. He exhibits normal muscle tone.  Skin: Skin is warm and dry. He is not diaphoretic.  Psychiatric: He has a normal mood and affect.    Labs reviewed: Recent Labs    03/22/17 06/18/17  NA 136  136 136*  136  K 4.5  4.5 4.5  4.5  BUN 40*  40* 38*  CREATININE 1.40  1.40 1.5*  1.46  CALCIUM 9.4  9.4 9.2   Recent Labs    03/22/17 06/18/17  AST 7  7 14  14   ALT 11  11 12  12   ALKPHOS  67  67 69  69  BILITOT 0.6  0.6 0.5  PROT 6.6  6.6 6.4  ALBUMIN 3.9  3.9 3.9   Recent Labs    03/22/17 06/18/17  WBC 5.8 5.4  5.4   HGB 11.8 11.6*  11.6  HCT 34 33*  33  PLT  --  123*   Lab Results  Component Value Date   TSH 4.53 06/21/2017   TSH 4.53 06/21/2017   Lab Results  Component Value Date   HGBA1C 6.1 (H) 04/22/2012   Lab Results  Component Value Date   CHOL 149 04/22/2012   HDL 35 (L) 04/22/2012   LDLCALC 96 04/22/2012   TRIG 89 04/22/2012   CHOLHDL 4.3 04/22/2012    Significant Diagnostic Results in last 30 days:  No results found.  Assessment/Plan  1. Full incontinence of feces Likely from recent excess treatment for constipation with miralax double dosing, senokot s and milk of magnesia and ongoing receiving of miralax and senokot s. Change miralax to daily as needed only and continue senokot s with instruction to hold for loose stool. Add probiotic and monitor. Maintain hydration  2. Bilateral leg weakness New and requiring assistance with ADLs. His recent episodes of loose stool and recent episode of pnuemonia could have added to the deconditioning. Will have patient work with PT/OT as tolerated to regain strength and restore function.  Fall precautions are to be in place. Rule out metabolic causes.   3. Need for assistance with personal care Likely from deconditioning. Will have cbc with diff and cmp to rule out metabolic etiology. PT and OT consult as above. Assistance to be provided in ALF for now, if need for assistance increases and infectious and metabolic abnormality are ruled out, will need to review goals of care for transfer to SNF.   4. Intertriginous dermatitis associated with moisture From recent episodes of incontinence with stool. Recommend using wet wipes for incontinence care. Let skin area dry and apply a paste of 1:1 zinc oxide and triamcinolone bid and prn until healed. Monitor for signs of infection. Monitor for pressure ulcer and advised to use gel cushion for wheelchair to help relieve pressure while sitting/    Family/ staff Communication: reviewed care plan with  patient and charge nurse.    Labs/tests ordered:  Cbc with diff, CMP with eGFR  Blanchie Serve, MD Internal Medicine [facility] [address] [place], [place] [address] Cell Phone (Monday-Friday 8 am - 5 pm): [phone] On Call: [phone] and follow prompts after 5 pm and on weekends Office Phone: [phone] Office Fax: [phone]

## 2017-10-22 DIAGNOSIS — R32 Unspecified urinary incontinence: Secondary | ICD-10-CM | POA: Diagnosis not present

## 2017-10-22 DIAGNOSIS — I2510 Atherosclerotic heart disease of native coronary artery without angina pectoris: Secondary | ICD-10-CM | POA: Diagnosis not present

## 2017-10-22 DIAGNOSIS — K219 Gastro-esophageal reflux disease without esophagitis: Secondary | ICD-10-CM | POA: Diagnosis not present

## 2017-10-22 DIAGNOSIS — M6281 Muscle weakness (generalized): Secondary | ICD-10-CM | POA: Diagnosis not present

## 2017-10-22 DIAGNOSIS — Q245 Malformation of coronary vessels: Secondary | ICD-10-CM | POA: Diagnosis not present

## 2017-10-22 DIAGNOSIS — I10 Essential (primary) hypertension: Secondary | ICD-10-CM | POA: Diagnosis not present

## 2017-10-22 DIAGNOSIS — N400 Enlarged prostate without lower urinary tract symptoms: Secondary | ICD-10-CM | POA: Diagnosis not present

## 2017-10-22 DIAGNOSIS — R030 Elevated blood-pressure reading, without diagnosis of hypertension: Secondary | ICD-10-CM | POA: Diagnosis not present

## 2017-10-22 DIAGNOSIS — I213 ST elevation (STEMI) myocardial infarction of unspecified site: Secondary | ICD-10-CM | POA: Diagnosis not present

## 2017-10-22 DIAGNOSIS — M1990 Unspecified osteoarthritis, unspecified site: Secondary | ICD-10-CM | POA: Diagnosis not present

## 2017-10-22 DIAGNOSIS — G4700 Insomnia, unspecified: Secondary | ICD-10-CM | POA: Diagnosis not present

## 2017-10-22 DIAGNOSIS — K589 Irritable bowel syndrome without diarrhea: Secondary | ICD-10-CM | POA: Diagnosis not present

## 2017-10-22 DIAGNOSIS — S43429A Sprain of unspecified rotator cuff capsule, initial encounter: Secondary | ICD-10-CM | POA: Diagnosis not present

## 2017-10-22 DIAGNOSIS — Z9181 History of falling: Secondary | ICD-10-CM | POA: Diagnosis not present

## 2017-10-22 DIAGNOSIS — H3530 Unspecified macular degeneration: Secondary | ICD-10-CM | POA: Diagnosis not present

## 2017-10-22 DIAGNOSIS — I509 Heart failure, unspecified: Secondary | ICD-10-CM | POA: Diagnosis not present

## 2017-10-22 DIAGNOSIS — R29898 Other symptoms and signs involving the musculoskeletal system: Secondary | ICD-10-CM | POA: Diagnosis not present

## 2017-10-22 DIAGNOSIS — D649 Anemia, unspecified: Secondary | ICD-10-CM | POA: Diagnosis not present

## 2017-10-22 DIAGNOSIS — M79672 Pain in left foot: Secondary | ICD-10-CM | POA: Diagnosis not present

## 2017-10-22 DIAGNOSIS — R05 Cough: Secondary | ICD-10-CM | POA: Diagnosis not present

## 2017-10-22 DIAGNOSIS — Z950 Presence of cardiac pacemaker: Secondary | ICD-10-CM | POA: Diagnosis not present

## 2017-10-22 DIAGNOSIS — I309 Acute pericarditis, unspecified: Secondary | ICD-10-CM | POA: Diagnosis not present

## 2017-10-22 DIAGNOSIS — I471 Supraventricular tachycardia: Secondary | ICD-10-CM | POA: Diagnosis not present

## 2017-10-22 LAB — CBC AND DIFFERENTIAL
HCT: 34 — AB (ref 41–53)
Hemoglobin: 10.9 — AB (ref 13.5–17.5)
PLATELETS: 179 (ref 150–399)
WBC: 8.6

## 2017-10-22 LAB — HEPATIC FUNCTION PANEL
ALK PHOS: 92 (ref 25–125)
ALT: 19 (ref 10–40)
AST: 19 (ref 14–40)
Bilirubin, Total: 0.4

## 2017-10-22 LAB — BASIC METABOLIC PANEL
BUN: 34 — AB (ref 4–21)
CREATININE: 1.5 — AB (ref 0.6–1.3)
GLUCOSE: 100
Potassium: 4.8 (ref 3.4–5.3)
Sodium: 136 — AB (ref 137–147)

## 2017-10-22 LAB — CMP 10231
ALBUMIN: 3.6
CO2: 24
Calcium: 8.9
Chloride: 103
Globulin: 2.9
Total Protein: 6.5

## 2017-10-23 DIAGNOSIS — M79672 Pain in left foot: Secondary | ICD-10-CM | POA: Diagnosis not present

## 2017-10-23 DIAGNOSIS — M6281 Muscle weakness (generalized): Secondary | ICD-10-CM | POA: Diagnosis not present

## 2017-10-23 DIAGNOSIS — R32 Unspecified urinary incontinence: Secondary | ICD-10-CM | POA: Diagnosis not present

## 2017-10-23 DIAGNOSIS — R29898 Other symptoms and signs involving the musculoskeletal system: Secondary | ICD-10-CM | POA: Diagnosis not present

## 2017-10-23 DIAGNOSIS — H3530 Unspecified macular degeneration: Secondary | ICD-10-CM | POA: Diagnosis not present

## 2017-10-23 DIAGNOSIS — G4700 Insomnia, unspecified: Secondary | ICD-10-CM | POA: Diagnosis not present

## 2017-10-24 DIAGNOSIS — R32 Unspecified urinary incontinence: Secondary | ICD-10-CM | POA: Diagnosis not present

## 2017-10-24 DIAGNOSIS — G4700 Insomnia, unspecified: Secondary | ICD-10-CM | POA: Diagnosis not present

## 2017-10-24 DIAGNOSIS — H3530 Unspecified macular degeneration: Secondary | ICD-10-CM | POA: Diagnosis not present

## 2017-10-24 DIAGNOSIS — R29898 Other symptoms and signs involving the musculoskeletal system: Secondary | ICD-10-CM | POA: Diagnosis not present

## 2017-10-24 DIAGNOSIS — M79672 Pain in left foot: Secondary | ICD-10-CM | POA: Diagnosis not present

## 2017-10-24 DIAGNOSIS — M6281 Muscle weakness (generalized): Secondary | ICD-10-CM | POA: Diagnosis not present

## 2017-10-29 DIAGNOSIS — M6281 Muscle weakness (generalized): Secondary | ICD-10-CM | POA: Diagnosis not present

## 2017-10-29 DIAGNOSIS — R29898 Other symptoms and signs involving the musculoskeletal system: Secondary | ICD-10-CM | POA: Diagnosis not present

## 2017-10-29 DIAGNOSIS — G4700 Insomnia, unspecified: Secondary | ICD-10-CM | POA: Diagnosis not present

## 2017-10-29 DIAGNOSIS — H3530 Unspecified macular degeneration: Secondary | ICD-10-CM | POA: Diagnosis not present

## 2017-10-29 DIAGNOSIS — M79672 Pain in left foot: Secondary | ICD-10-CM | POA: Diagnosis not present

## 2017-10-29 DIAGNOSIS — R32 Unspecified urinary incontinence: Secondary | ICD-10-CM | POA: Diagnosis not present

## 2017-10-30 DIAGNOSIS — M79672 Pain in left foot: Secondary | ICD-10-CM | POA: Diagnosis not present

## 2017-10-30 DIAGNOSIS — R32 Unspecified urinary incontinence: Secondary | ICD-10-CM | POA: Diagnosis not present

## 2017-10-30 DIAGNOSIS — G4700 Insomnia, unspecified: Secondary | ICD-10-CM | POA: Diagnosis not present

## 2017-10-30 DIAGNOSIS — R29898 Other symptoms and signs involving the musculoskeletal system: Secondary | ICD-10-CM | POA: Diagnosis not present

## 2017-10-30 DIAGNOSIS — M6281 Muscle weakness (generalized): Secondary | ICD-10-CM | POA: Diagnosis not present

## 2017-10-30 DIAGNOSIS — H3530 Unspecified macular degeneration: Secondary | ICD-10-CM | POA: Diagnosis not present

## 2017-10-31 DIAGNOSIS — M79672 Pain in left foot: Secondary | ICD-10-CM | POA: Diagnosis not present

## 2017-10-31 DIAGNOSIS — R32 Unspecified urinary incontinence: Secondary | ICD-10-CM | POA: Diagnosis not present

## 2017-10-31 DIAGNOSIS — M6281 Muscle weakness (generalized): Secondary | ICD-10-CM | POA: Diagnosis not present

## 2017-10-31 DIAGNOSIS — R29898 Other symptoms and signs involving the musculoskeletal system: Secondary | ICD-10-CM | POA: Diagnosis not present

## 2017-10-31 DIAGNOSIS — G4700 Insomnia, unspecified: Secondary | ICD-10-CM | POA: Diagnosis not present

## 2017-10-31 DIAGNOSIS — H3530 Unspecified macular degeneration: Secondary | ICD-10-CM | POA: Diagnosis not present

## 2017-11-01 DIAGNOSIS — M6281 Muscle weakness (generalized): Secondary | ICD-10-CM | POA: Diagnosis not present

## 2017-11-01 DIAGNOSIS — R32 Unspecified urinary incontinence: Secondary | ICD-10-CM | POA: Diagnosis not present

## 2017-11-01 DIAGNOSIS — G4700 Insomnia, unspecified: Secondary | ICD-10-CM | POA: Diagnosis not present

## 2017-11-01 DIAGNOSIS — R29898 Other symptoms and signs involving the musculoskeletal system: Secondary | ICD-10-CM | POA: Diagnosis not present

## 2017-11-01 DIAGNOSIS — H3530 Unspecified macular degeneration: Secondary | ICD-10-CM | POA: Diagnosis not present

## 2017-11-01 DIAGNOSIS — M79672 Pain in left foot: Secondary | ICD-10-CM | POA: Diagnosis not present

## 2017-11-05 DIAGNOSIS — G4700 Insomnia, unspecified: Secondary | ICD-10-CM | POA: Diagnosis not present

## 2017-11-05 DIAGNOSIS — M79672 Pain in left foot: Secondary | ICD-10-CM | POA: Diagnosis not present

## 2017-11-05 DIAGNOSIS — R32 Unspecified urinary incontinence: Secondary | ICD-10-CM | POA: Diagnosis not present

## 2017-11-05 DIAGNOSIS — M6281 Muscle weakness (generalized): Secondary | ICD-10-CM | POA: Diagnosis not present

## 2017-11-05 DIAGNOSIS — R29898 Other symptoms and signs involving the musculoskeletal system: Secondary | ICD-10-CM | POA: Diagnosis not present

## 2017-11-05 DIAGNOSIS — H3530 Unspecified macular degeneration: Secondary | ICD-10-CM | POA: Diagnosis not present

## 2017-11-06 DIAGNOSIS — G4700 Insomnia, unspecified: Secondary | ICD-10-CM | POA: Diagnosis not present

## 2017-11-06 DIAGNOSIS — R32 Unspecified urinary incontinence: Secondary | ICD-10-CM | POA: Diagnosis not present

## 2017-11-06 DIAGNOSIS — M79672 Pain in left foot: Secondary | ICD-10-CM | POA: Diagnosis not present

## 2017-11-06 DIAGNOSIS — H3530 Unspecified macular degeneration: Secondary | ICD-10-CM | POA: Diagnosis not present

## 2017-11-06 DIAGNOSIS — R29898 Other symptoms and signs involving the musculoskeletal system: Secondary | ICD-10-CM | POA: Diagnosis not present

## 2017-11-06 DIAGNOSIS — M6281 Muscle weakness (generalized): Secondary | ICD-10-CM | POA: Diagnosis not present

## 2017-11-07 DIAGNOSIS — R29898 Other symptoms and signs involving the musculoskeletal system: Secondary | ICD-10-CM | POA: Diagnosis not present

## 2017-11-07 DIAGNOSIS — M79672 Pain in left foot: Secondary | ICD-10-CM | POA: Diagnosis not present

## 2017-11-07 DIAGNOSIS — M6281 Muscle weakness (generalized): Secondary | ICD-10-CM | POA: Diagnosis not present

## 2017-11-07 DIAGNOSIS — R32 Unspecified urinary incontinence: Secondary | ICD-10-CM | POA: Diagnosis not present

## 2017-11-07 DIAGNOSIS — H3530 Unspecified macular degeneration: Secondary | ICD-10-CM | POA: Diagnosis not present

## 2017-11-07 DIAGNOSIS — G4700 Insomnia, unspecified: Secondary | ICD-10-CM | POA: Diagnosis not present

## 2017-11-08 ENCOUNTER — Ambulatory Visit (INDEPENDENT_AMBULATORY_CARE_PROVIDER_SITE_OTHER): Payer: Medicare Other | Admitting: *Deleted

## 2017-11-08 DIAGNOSIS — H3530 Unspecified macular degeneration: Secondary | ICD-10-CM | POA: Diagnosis not present

## 2017-11-08 DIAGNOSIS — R32 Unspecified urinary incontinence: Secondary | ICD-10-CM | POA: Diagnosis not present

## 2017-11-08 DIAGNOSIS — R29898 Other symptoms and signs involving the musculoskeletal system: Secondary | ICD-10-CM | POA: Diagnosis not present

## 2017-11-08 DIAGNOSIS — M6281 Muscle weakness (generalized): Secondary | ICD-10-CM | POA: Diagnosis not present

## 2017-11-08 DIAGNOSIS — I495 Sick sinus syndrome: Secondary | ICD-10-CM

## 2017-11-08 DIAGNOSIS — M79672 Pain in left foot: Secondary | ICD-10-CM | POA: Diagnosis not present

## 2017-11-08 DIAGNOSIS — G4700 Insomnia, unspecified: Secondary | ICD-10-CM | POA: Diagnosis not present

## 2017-11-08 NOTE — Progress Notes (Signed)
Remote pacemaker transmission.   

## 2017-11-09 ENCOUNTER — Encounter: Payer: Self-pay | Admitting: Cardiology

## 2017-11-09 DIAGNOSIS — M79672 Pain in left foot: Secondary | ICD-10-CM | POA: Diagnosis not present

## 2017-11-09 DIAGNOSIS — G4700 Insomnia, unspecified: Secondary | ICD-10-CM | POA: Diagnosis not present

## 2017-11-09 DIAGNOSIS — R29898 Other symptoms and signs involving the musculoskeletal system: Secondary | ICD-10-CM | POA: Diagnosis not present

## 2017-11-09 DIAGNOSIS — M6281 Muscle weakness (generalized): Secondary | ICD-10-CM | POA: Diagnosis not present

## 2017-11-09 DIAGNOSIS — R32 Unspecified urinary incontinence: Secondary | ICD-10-CM | POA: Diagnosis not present

## 2017-11-09 DIAGNOSIS — H3530 Unspecified macular degeneration: Secondary | ICD-10-CM | POA: Diagnosis not present

## 2017-11-11 DIAGNOSIS — G4700 Insomnia, unspecified: Secondary | ICD-10-CM | POA: Diagnosis not present

## 2017-11-11 DIAGNOSIS — H3530 Unspecified macular degeneration: Secondary | ICD-10-CM | POA: Diagnosis not present

## 2017-11-11 DIAGNOSIS — R29898 Other symptoms and signs involving the musculoskeletal system: Secondary | ICD-10-CM | POA: Diagnosis not present

## 2017-11-11 DIAGNOSIS — M6281 Muscle weakness (generalized): Secondary | ICD-10-CM | POA: Diagnosis not present

## 2017-11-11 DIAGNOSIS — R32 Unspecified urinary incontinence: Secondary | ICD-10-CM | POA: Diagnosis not present

## 2017-11-11 DIAGNOSIS — M79672 Pain in left foot: Secondary | ICD-10-CM | POA: Diagnosis not present

## 2017-11-13 DIAGNOSIS — G4700 Insomnia, unspecified: Secondary | ICD-10-CM | POA: Diagnosis not present

## 2017-11-13 DIAGNOSIS — H3530 Unspecified macular degeneration: Secondary | ICD-10-CM | POA: Diagnosis not present

## 2017-11-13 DIAGNOSIS — M79672 Pain in left foot: Secondary | ICD-10-CM | POA: Diagnosis not present

## 2017-11-13 DIAGNOSIS — R32 Unspecified urinary incontinence: Secondary | ICD-10-CM | POA: Diagnosis not present

## 2017-11-13 DIAGNOSIS — R29898 Other symptoms and signs involving the musculoskeletal system: Secondary | ICD-10-CM | POA: Diagnosis not present

## 2017-11-13 DIAGNOSIS — M6281 Muscle weakness (generalized): Secondary | ICD-10-CM | POA: Diagnosis not present

## 2017-11-14 DIAGNOSIS — G4700 Insomnia, unspecified: Secondary | ICD-10-CM | POA: Diagnosis not present

## 2017-11-14 DIAGNOSIS — H3530 Unspecified macular degeneration: Secondary | ICD-10-CM | POA: Diagnosis not present

## 2017-11-14 DIAGNOSIS — M79672 Pain in left foot: Secondary | ICD-10-CM | POA: Diagnosis not present

## 2017-11-14 DIAGNOSIS — R29898 Other symptoms and signs involving the musculoskeletal system: Secondary | ICD-10-CM | POA: Diagnosis not present

## 2017-11-14 DIAGNOSIS — R32 Unspecified urinary incontinence: Secondary | ICD-10-CM | POA: Diagnosis not present

## 2017-11-14 DIAGNOSIS — M6281 Muscle weakness (generalized): Secondary | ICD-10-CM | POA: Diagnosis not present

## 2017-11-15 DIAGNOSIS — R32 Unspecified urinary incontinence: Secondary | ICD-10-CM | POA: Diagnosis not present

## 2017-11-15 DIAGNOSIS — M6281 Muscle weakness (generalized): Secondary | ICD-10-CM | POA: Diagnosis not present

## 2017-11-15 DIAGNOSIS — R29898 Other symptoms and signs involving the musculoskeletal system: Secondary | ICD-10-CM | POA: Diagnosis not present

## 2017-11-15 DIAGNOSIS — G4700 Insomnia, unspecified: Secondary | ICD-10-CM | POA: Diagnosis not present

## 2017-11-15 DIAGNOSIS — H3530 Unspecified macular degeneration: Secondary | ICD-10-CM | POA: Diagnosis not present

## 2017-11-15 DIAGNOSIS — M79672 Pain in left foot: Secondary | ICD-10-CM | POA: Diagnosis not present

## 2017-11-15 LAB — CUP PACEART REMOTE DEVICE CHECK
Battery Impedance: 743 Ohm
Battery Voltage: 2.78 V
Brady Statistic AP VP Percent: 97 %
Brady Statistic AP VS Percent: 0 %
Brady Statistic AS VP Percent: 3 %
Brady Statistic AS VS Percent: 0 %
Date Time Interrogation Session: 20190715195109
Implantable Lead Implant Date: 20050622
Implantable Lead Implant Date: 20050622
Implantable Lead Location: 753859
Implantable Lead Model: 5076
Implantable Lead Model: 5092
Implantable Pulse Generator Implant Date: 20140708
Lead Channel Impedance Value: 430 Ohm
Lead Channel Impedance Value: 652 Ohm
Lead Channel Pacing Threshold Amplitude: 0.5 V
Lead Channel Pacing Threshold Pulse Width: 0.4 ms
Lead Channel Setting Pacing Amplitude: 2 V
Lead Channel Setting Pacing Amplitude: 2.5 V
Lead Channel Setting Pacing Pulse Width: 0.4 ms
MDC IDC LEAD LOCATION: 753860
MDC IDC MSMT BATTERY REMAINING LONGEVITY: 59 mo
MDC IDC MSMT LEADCHNL RV PACING THRESHOLD AMPLITUDE: 0.5 V
MDC IDC MSMT LEADCHNL RV PACING THRESHOLD PULSEWIDTH: 0.4 ms
MDC IDC SET LEADCHNL RV SENSING SENSITIVITY: 4 mV

## 2017-11-16 DIAGNOSIS — M6281 Muscle weakness (generalized): Secondary | ICD-10-CM | POA: Diagnosis not present

## 2017-11-16 DIAGNOSIS — M79672 Pain in left foot: Secondary | ICD-10-CM | POA: Diagnosis not present

## 2017-11-16 DIAGNOSIS — R32 Unspecified urinary incontinence: Secondary | ICD-10-CM | POA: Diagnosis not present

## 2017-11-16 DIAGNOSIS — R29898 Other symptoms and signs involving the musculoskeletal system: Secondary | ICD-10-CM | POA: Diagnosis not present

## 2017-11-16 DIAGNOSIS — H3530 Unspecified macular degeneration: Secondary | ICD-10-CM | POA: Diagnosis not present

## 2017-11-16 DIAGNOSIS — G4700 Insomnia, unspecified: Secondary | ICD-10-CM | POA: Diagnosis not present

## 2017-11-19 ENCOUNTER — Other Ambulatory Visit: Payer: Self-pay

## 2017-11-19 ENCOUNTER — Emergency Department (HOSPITAL_COMMUNITY): Payer: Medicare Other

## 2017-11-19 ENCOUNTER — Encounter (HOSPITAL_COMMUNITY): Payer: Self-pay

## 2017-11-19 ENCOUNTER — Inpatient Hospital Stay (HOSPITAL_COMMUNITY)
Admission: EM | Admit: 2017-11-19 | Discharge: 2017-11-23 | DRG: 177 | Disposition: A | Discharge status: Skilled Nursing Facility | Payer: Medicare Other | Attending: Internal Medicine | Admitting: Internal Medicine

## 2017-11-19 DIAGNOSIS — Z79899 Other long term (current) drug therapy: Secondary | ICD-10-CM

## 2017-11-19 DIAGNOSIS — I130 Hypertensive heart and chronic kidney disease with heart failure and stage 1 through stage 4 chronic kidney disease, or unspecified chronic kidney disease: Secondary | ICD-10-CM | POA: Diagnosis present

## 2017-11-19 DIAGNOSIS — I252 Old myocardial infarction: Secondary | ICD-10-CM | POA: Diagnosis not present

## 2017-11-19 DIAGNOSIS — Z23 Encounter for immunization: Secondary | ICD-10-CM

## 2017-11-19 DIAGNOSIS — Z9079 Acquired absence of other genital organ(s): Secondary | ICD-10-CM

## 2017-11-19 DIAGNOSIS — I959 Hypotension, unspecified: Secondary | ICD-10-CM | POA: Diagnosis not present

## 2017-11-19 DIAGNOSIS — I10 Essential (primary) hypertension: Secondary | ICD-10-CM | POA: Diagnosis present

## 2017-11-19 DIAGNOSIS — K5900 Constipation, unspecified: Secondary | ICD-10-CM | POA: Diagnosis present

## 2017-11-19 DIAGNOSIS — N401 Enlarged prostate with lower urinary tract symptoms: Secondary | ICD-10-CM | POA: Diagnosis present

## 2017-11-19 DIAGNOSIS — I5032 Chronic diastolic (congestive) heart failure: Secondary | ICD-10-CM | POA: Diagnosis present

## 2017-11-19 DIAGNOSIS — R3915 Urgency of urination: Secondary | ICD-10-CM | POA: Diagnosis not present

## 2017-11-19 DIAGNOSIS — E785 Hyperlipidemia, unspecified: Secondary | ICD-10-CM | POA: Diagnosis present

## 2017-11-19 DIAGNOSIS — R338 Other retention of urine: Secondary | ICD-10-CM | POA: Diagnosis present

## 2017-11-19 DIAGNOSIS — Y95 Nosocomial condition: Secondary | ICD-10-CM | POA: Diagnosis present

## 2017-11-19 DIAGNOSIS — Z9841 Cataract extraction status, right eye: Secondary | ICD-10-CM

## 2017-11-19 DIAGNOSIS — Z950 Presence of cardiac pacemaker: Secondary | ICD-10-CM

## 2017-11-19 DIAGNOSIS — R4182 Altered mental status, unspecified: Secondary | ICD-10-CM | POA: Diagnosis not present

## 2017-11-19 DIAGNOSIS — Z515 Encounter for palliative care: Secondary | ICD-10-CM | POA: Diagnosis not present

## 2017-11-19 DIAGNOSIS — N179 Acute kidney failure, unspecified: Secondary | ICD-10-CM | POA: Diagnosis not present

## 2017-11-19 DIAGNOSIS — R945 Abnormal results of liver function studies: Secondary | ICD-10-CM | POA: Diagnosis present

## 2017-11-19 DIAGNOSIS — Z7189 Other specified counseling: Secondary | ICD-10-CM

## 2017-11-19 DIAGNOSIS — L8990 Pressure ulcer of unspecified site, unspecified stage: Secondary | ICD-10-CM

## 2017-11-19 DIAGNOSIS — Z9189 Other specified personal risk factors, not elsewhere classified: Secondary | ICD-10-CM | POA: Diagnosis not present

## 2017-11-19 DIAGNOSIS — I2510 Atherosclerotic heart disease of native coronary artery without angina pectoris: Secondary | ICD-10-CM | POA: Diagnosis present

## 2017-11-19 DIAGNOSIS — H3530 Unspecified macular degeneration: Secondary | ICD-10-CM | POA: Diagnosis present

## 2017-11-19 DIAGNOSIS — N200 Calculus of kidney: Secondary | ICD-10-CM | POA: Diagnosis not present

## 2017-11-19 DIAGNOSIS — J449 Chronic obstructive pulmonary disease, unspecified: Secondary | ICD-10-CM | POA: Diagnosis present

## 2017-11-19 DIAGNOSIS — G4700 Insomnia, unspecified: Secondary | ICD-10-CM | POA: Diagnosis present

## 2017-11-19 DIAGNOSIS — Z66 Do not resuscitate: Secondary | ICD-10-CM | POA: Diagnosis present

## 2017-11-19 DIAGNOSIS — R531 Weakness: Secondary | ICD-10-CM | POA: Diagnosis not present

## 2017-11-19 DIAGNOSIS — E1122 Type 2 diabetes mellitus with diabetic chronic kidney disease: Secondary | ICD-10-CM | POA: Diagnosis present

## 2017-11-19 DIAGNOSIS — J181 Lobar pneumonia, unspecified organism: Secondary | ICD-10-CM | POA: Diagnosis not present

## 2017-11-19 DIAGNOSIS — N184 Chronic kidney disease, stage 4 (severe): Secondary | ICD-10-CM | POA: Diagnosis not present

## 2017-11-19 DIAGNOSIS — G9341 Metabolic encephalopathy: Secondary | ICD-10-CM | POA: Diagnosis present

## 2017-11-19 DIAGNOSIS — G2581 Restless legs syndrome: Secondary | ICD-10-CM | POA: Diagnosis present

## 2017-11-19 DIAGNOSIS — R410 Disorientation, unspecified: Secondary | ICD-10-CM | POA: Diagnosis not present

## 2017-11-19 DIAGNOSIS — Z7401 Bed confinement status: Secondary | ICD-10-CM | POA: Diagnosis not present

## 2017-11-19 DIAGNOSIS — J189 Pneumonia, unspecified organism: Secondary | ICD-10-CM | POA: Diagnosis present

## 2017-11-19 DIAGNOSIS — Z96653 Presence of artificial knee joint, bilateral: Secondary | ICD-10-CM | POA: Diagnosis present

## 2017-11-19 DIAGNOSIS — G894 Chronic pain syndrome: Secondary | ICD-10-CM | POA: Diagnosis present

## 2017-11-19 DIAGNOSIS — I480 Paroxysmal atrial fibrillation: Secondary | ICD-10-CM | POA: Diagnosis present

## 2017-11-19 DIAGNOSIS — J690 Pneumonitis due to inhalation of food and vomit: Secondary | ICD-10-CM | POA: Diagnosis not present

## 2017-11-19 DIAGNOSIS — K219 Gastro-esophageal reflux disease without esophagitis: Secondary | ICD-10-CM | POA: Diagnosis present

## 2017-11-19 DIAGNOSIS — F329 Major depressive disorder, single episode, unspecified: Secondary | ICD-10-CM | POA: Diagnosis present

## 2017-11-19 DIAGNOSIS — J90 Pleural effusion, not elsewhere classified: Secondary | ICD-10-CM | POA: Diagnosis not present

## 2017-11-19 DIAGNOSIS — Z886 Allergy status to analgesic agent status: Secondary | ICD-10-CM

## 2017-11-19 DIAGNOSIS — E870 Hyperosmolality and hypernatremia: Secondary | ICD-10-CM | POA: Diagnosis present

## 2017-11-19 DIAGNOSIS — I495 Sick sinus syndrome: Secondary | ICD-10-CM | POA: Diagnosis present

## 2017-11-19 DIAGNOSIS — Z7982 Long term (current) use of aspirin: Secondary | ICD-10-CM

## 2017-11-19 DIAGNOSIS — Z882 Allergy status to sulfonamides status: Secondary | ICD-10-CM

## 2017-11-19 DIAGNOSIS — E878 Other disorders of electrolyte and fluid balance, not elsewhere classified: Secondary | ICD-10-CM | POA: Diagnosis present

## 2017-11-19 DIAGNOSIS — R339 Retention of urine, unspecified: Secondary | ICD-10-CM | POA: Diagnosis present

## 2017-11-19 DIAGNOSIS — Z823 Family history of stroke: Secondary | ICD-10-CM

## 2017-11-19 DIAGNOSIS — R918 Other nonspecific abnormal finding of lung field: Secondary | ICD-10-CM | POA: Diagnosis not present

## 2017-11-19 DIAGNOSIS — M2550 Pain in unspecified joint: Secondary | ICD-10-CM | POA: Diagnosis not present

## 2017-11-19 DIAGNOSIS — Z888 Allergy status to other drugs, medicaments and biological substances status: Secondary | ICD-10-CM

## 2017-11-19 DIAGNOSIS — Z8701 Personal history of pneumonia (recurrent): Secondary | ICD-10-CM

## 2017-11-19 LAB — CBC WITH DIFFERENTIAL/PLATELET
Basophils Absolute: 0 10*3/uL (ref 0.0–0.1)
Basophils Relative: 0 %
EOS PCT: 0 %
Eosinophils Absolute: 0 10*3/uL (ref 0.0–0.7)
HCT: 30.4 % — ABNORMAL LOW (ref 39.0–52.0)
HEMOGLOBIN: 9.6 g/dL — AB (ref 13.0–17.0)
LYMPHS ABS: 0.8 10*3/uL (ref 0.7–4.0)
LYMPHS PCT: 8 %
MCH: 32.4 pg (ref 26.0–34.0)
MCHC: 31.6 g/dL (ref 30.0–36.0)
MCV: 102.7 fL — ABNORMAL HIGH (ref 78.0–100.0)
MONOS PCT: 7 %
Monocytes Absolute: 0.7 10*3/uL (ref 0.1–1.0)
Neutro Abs: 8.9 10*3/uL — ABNORMAL HIGH (ref 1.7–7.7)
Neutrophils Relative %: 85 %
Platelets: 259 10*3/uL (ref 150–400)
RBC: 2.96 MIL/uL — AB (ref 4.22–5.81)
RDW: 14.2 % (ref 11.5–15.5)
WBC Morphology: INCREASED
WBC: 10.4 10*3/uL (ref 4.0–10.5)

## 2017-11-19 LAB — URINALYSIS, ROUTINE W REFLEX MICROSCOPIC
Bilirubin Urine: NEGATIVE
GLUCOSE, UA: NEGATIVE mg/dL
Hgb urine dipstick: NEGATIVE
KETONES UR: NEGATIVE mg/dL
Leukocytes, UA: NEGATIVE
Nitrite: NEGATIVE
Protein, ur: NEGATIVE mg/dL
Specific Gravity, Urine: 1.015 (ref 1.005–1.030)
pH: 5 (ref 5.0–8.0)

## 2017-11-19 LAB — COMPREHENSIVE METABOLIC PANEL
ALBUMIN: 2.2 g/dL — AB (ref 3.5–5.0)
ALT: 64 U/L — AB (ref 0–44)
AST: 56 U/L — AB (ref 15–41)
Alkaline Phosphatase: 135 U/L — ABNORMAL HIGH (ref 38–126)
Anion gap: 9 (ref 5–15)
BUN: 79 mg/dL — AB (ref 8–23)
CHLORIDE: 112 mmol/L — AB (ref 98–111)
CO2: 21 mmol/L — ABNORMAL LOW (ref 22–32)
CREATININE: 2.39 mg/dL — AB (ref 0.61–1.24)
Calcium: 8.5 mg/dL — ABNORMAL LOW (ref 8.9–10.3)
GFR calc Af Amer: 24 mL/min — ABNORMAL LOW (ref >60)
GFR calc non Af Amer: 21 mL/min — ABNORMAL LOW (ref >60)
Glucose, Bld: 179 mg/dL — ABNORMAL HIGH (ref 70–99)
Potassium: 4.5 mmol/L (ref 3.5–5.1)
Sodium: 142 mmol/L (ref 135–145)
Total Bilirubin: 1 mg/dL (ref 0.3–1.2)
Total Protein: 6.2 g/dL — ABNORMAL LOW (ref 6.5–8.1)

## 2017-11-19 LAB — AMMONIA: Ammonia: 14 umol/L (ref 9–35)

## 2017-11-19 LAB — LIPASE, BLOOD: Lipase: 24 U/L (ref 11–51)

## 2017-11-19 LAB — STREP PNEUMONIAE URINARY ANTIGEN: STREP PNEUMO URINARY ANTIGEN: NEGATIVE

## 2017-11-19 LAB — PROCALCITONIN: Procalcitonin: 0.65 ng/mL

## 2017-11-19 LAB — I-STAT CG4 LACTIC ACID, ED: Lactic Acid, Venous: 1.7 mmol/L (ref 0.5–1.9)

## 2017-11-19 LAB — GLUCOSE, CAPILLARY: Glucose-Capillary: 142 mg/dL — ABNORMAL HIGH (ref 70–99)

## 2017-11-19 MED ORDER — VANCOMYCIN HCL 10 G IV SOLR
1500.0000 mg | Freq: Once | INTRAVENOUS | Status: AC
  Administered 2017-11-19: 1500 mg via INTRAVENOUS
  Filled 2017-11-19: qty 1500

## 2017-11-19 MED ORDER — PHENYLEPHRINE IN HARD FAT 0.25 % RE SUPP
1.0000 | RECTAL | Status: DC | PRN
  Filled 2017-11-19: qty 1

## 2017-11-19 MED ORDER — SODIUM CHLORIDE 0.9 % IV BOLUS
1000.0000 mL | Freq: Once | INTRAVENOUS | Status: AC
  Administered 2017-11-19: 1000 mL via INTRAVENOUS

## 2017-11-19 MED ORDER — METOPROLOL TARTRATE 25 MG PO TABS: 25.0000 mg | ORAL_TABLET | Freq: Two times a day (BID) | ORAL | Status: DC

## 2017-11-19 MED ORDER — TAMSULOSIN HCL 0.4 MG PO CAPS: 0.4000 mg | ORAL_CAPSULE | Freq: Every day | ORAL | Status: DC

## 2017-11-19 MED ORDER — POLYVINYL ALCOHOL 1.4 % OP SOLN
1.0000 [drp] | Freq: Two times a day (BID) | OPHTHALMIC | Status: DC
  Administered 2017-11-19 – 2017-11-23 (×8): 1 [drp] via OPHTHALMIC
  Filled 2017-11-19: qty 15

## 2017-11-19 MED ORDER — ENOXAPARIN SODIUM 30 MG/0.3ML ~~LOC~~ SOLN
30.0000 mg | SUBCUTANEOUS | Status: DC
  Administered 2017-11-19 – 2017-11-22 (×4): 30 mg via SUBCUTANEOUS
  Filled 2017-11-19 (×4): qty 0.3

## 2017-11-19 MED ORDER — HYPROMELLOSE (GONIOSCOPIC) 2.5 % OP SOLN
1.0000 [drp] | Freq: Four times a day (QID) | OPHTHALMIC | Status: DC | PRN
  Filled 2017-11-19: qty 15

## 2017-11-19 MED ORDER — FLUTICASONE PROPIONATE 50 MCG/ACT NA SUSP: 1.0000 | Freq: Every day | NASAL | Status: DC

## 2017-11-19 MED ORDER — LATANOPROST 0.005 % OP SOLN
1.0000 [drp] | Freq: Every day | OPHTHALMIC | Status: DC
  Administered 2017-11-19 – 2017-11-22 (×4): 1 [drp] via OPHTHALMIC
  Filled 2017-11-19: qty 2.5

## 2017-11-19 MED ORDER — FINASTERIDE 5 MG PO TABS: 5.0000 mg | ORAL_TABLET | Freq: Every day | ORAL | Status: DC

## 2017-11-19 MED ORDER — VANCOMYCIN HCL 10 G IV SOLR: 1500.0000 mg | Freq: Once | INTRAVENOUS | Status: DC

## 2017-11-19 MED ORDER — SODIUM CHLORIDE 0.9 % IV SOLN
500.0000 mg | Freq: Once | INTRAVENOUS | Status: DC
  Administered 2017-11-19: 500 mg via INTRAVENOUS
  Filled 2017-11-19: qty 500

## 2017-11-19 MED ORDER — SODIUM CHLORIDE 0.9 % IV SOLN: 1.0000 g | Freq: Three times a day (TID) | INTRAVENOUS | Status: DC

## 2017-11-19 MED ORDER — POLYETHYLENE GLYCOL 3350 17 G PO PACK: 17.0000 g | PACK | Freq: Every day | ORAL | Status: DC

## 2017-11-19 MED ORDER — SERTRALINE HCL 25 MG PO TABS: 25.0000 mg | ORAL_TABLET | Freq: Every day | ORAL | Status: DC

## 2017-11-19 MED ORDER — ONDANSETRON HCL 4 MG/2ML IJ SOLN: 4.0000 mg | Freq: Four times a day (QID) | INTRAMUSCULAR | Status: DC | PRN

## 2017-11-19 MED ORDER — SODIUM CHLORIDE 0.9 % IV SOLN
INTRAVENOUS | Status: DC
  Administered 2017-11-19 – 2017-11-21 (×4): via INTRAVENOUS

## 2017-11-19 MED ORDER — ACETAMINOPHEN 325 MG PO TABS: 650.0000 mg | ORAL_TABLET | Freq: Four times a day (QID) | ORAL | Status: DC | PRN

## 2017-11-19 MED ORDER — SODIUM CHLORIDE 0.9 % IV SOLN
1.0000 g | Freq: Once | INTRAVENOUS | Status: AC
  Administered 2017-11-19: 1 g via INTRAVENOUS
  Filled 2017-11-19: qty 10

## 2017-11-19 MED ORDER — METOPROLOL TARTRATE 5 MG/5ML IV SOLN
2.5000 mg | Freq: Two times a day (BID) | INTRAVENOUS | Status: DC
  Administered 2017-11-19 – 2017-11-21 (×5): 2.5 mg via INTRAVENOUS
  Filled 2017-11-19 (×7): qty 5

## 2017-11-19 MED ORDER — INSULIN ASPART 100 UNIT/ML ~~LOC~~ SOLN
0.0000 [IU] | Freq: Three times a day (TID) | SUBCUTANEOUS | Status: DC
  Administered 2017-11-19 – 2017-11-23 (×6): 1 [IU] via SUBCUTANEOUS

## 2017-11-19 MED ORDER — PANTOPRAZOLE SODIUM 40 MG PO TBEC: 40.0000 mg | DELAYED_RELEASE_TABLET | Freq: Every day | ORAL | Status: DC

## 2017-11-19 MED ORDER — ASPIRIN EC 81 MG PO TBEC
81.0000 mg | DELAYED_RELEASE_TABLET | Freq: Every day | ORAL | Status: DC
  Administered 2017-11-20: 81 mg via ORAL
  Filled 2017-11-19 (×2): qty 1

## 2017-11-19 MED ORDER — VANCOMYCIN HCL IN DEXTROSE 1-5 GM/200ML-% IV SOLN
1000.0000 mg | INTRAVENOUS | Status: DC
  Administered 2017-11-21: 1000 mg via INTRAVENOUS
  Filled 2017-11-19: qty 200

## 2017-11-19 NOTE — ED Notes (Signed)
Attempted in&out catheter. Unable to reach bladder because of obstruction. Condom catheter placed. MD notified.

## 2017-11-19 NOTE — H&P (Signed)
History and Physical    Caleb Nash:[id number] DOB: [birth year] DOA: 11/19/2017  PCP: Oneal Grout, MD Patient coming from:  [facility]   Chief Complaint:  Weakness  HPI: Caleb Nash is a 84 y.o. male with medical history significant of PAT/afib not on anticoagulation; pacemaker placement; BPH; HTN; HLD; and CAD presenting with weakness. He was unaccompanied and unable to answer questions at the time of my evaluation.  HPI per PA Joy:   Arrives via EMS from assisted living facility.  Patient's daughter and granddaughter are at the bedside and provide all the history.  Patient has reportedly had less activity and generalized weakness over the past 2 weeks. Decreased appetite and oral intake over the last few days. Was responding normally as of yesterday, but the facility contacted patient's family today and let them know that he was responding less appropriately and had mild tachycardia.  Last bowel movement was reportedly prior to arrival. Facility does report abnormal urine smell.  Patient was treated for pneumonia 3 weeks ago and has not had a cough since then.  They deny knowledge of recent complaints, fever, nausea, vomiting, urinary symptoms, abdominal pain, chest pain, shortness of breath, falls/trauma, or any other complaints.   ED Course:   AMS, steady decline for days.  Found to have LLL PNA.  He is from a facility, usually interactive and vibrant in AL.  2 weeks of weakness, recent decrease in PO intake, 24 hours with decline in MS.  AKI  - Creatinine 2.39, up from 1.5 1 month ago.  Goals of care - palliative consult in AM, unlikely to improve but the family would like to try for now.  Review of Systems: Unable to assess   Ambulatory Status:   Ambulates with a walker  Past Medical History:  Diagnosis Date  . Abnormality of gait 06/11/2012  . Alcohol abuse 04/26/2012  . Allergy, unspecified not elsewhere classified 04/28/2012  . Atrial fibrillation  (HCC) 04/28/2012  . BPH (benign prostatic hyperplasia)   . Cardiac pacemaker in situ 04/28/2012  . CHF (congestive heart failure) (HCC) 04/23/2012  . Chronic airway obstruction, not elsewhere classified 04/26/2012  . Coronary atherosclerosis of native coronary artery 04/28/2012  . Degenerative joint disease 04/21/2012  . Depression    mild per pt  . Essential hypertension 10/17/2012  . GERD (gastroesophageal reflux disease) 09/13/2012  . History of rhabdomyolysis    s/p fall with prolonged downtime  . Hyperlipidemia   . Ingrowing nail 06/11/2012  . Insomnia 11/15/2012  . Insomnia, unspecified 04/28/2012  . Lumbago   . Macular degeneration   . Muscle weakness (generalized) 04/26/2012  . Osteoarthritis   . Other specified disease of white blood cells 04/26/2012  . Paroxysmal atrial tachycardia (HCC) 04/21/2012   Brief episodes. Not felt to be an anticoagulation candidate because of age and frailty   . Pericarditis 04/21/2012  . Personal history of fall 04/28/2012  . Reflux esophagitis 04/28/2012  . Seborrheic eczema 12/03/2012  . Sick sinus syndrome (HCC) 10/14/2003; 10/29/2012   MDT EnPulse implanted by Dr Amil Amen for SSS; generator change 10/29/2012 by Dr Johney Frame MDT Jana Half pacemaker  . Sporotrichosis    remote  . Synovial cyst of wrist 11/10/2014   Left wrist    . Tachy-brady syndrome (HCC) 04/21/2012   Pacemaker is near EOL   . Unspecified constipation 09/13/2012  . Unspecified glaucoma(365.9) 04/28/2012    Past Surgical History:  Procedure Laterality Date  . ANGIOPLASTY  9/11/190   Dr.  Smith  . APPENDECTOMY    . BACK SURGERY  03/11/2001  . bilateral knee arthroplasty  09/28/83; 05/17/1992   right then left  . CARPAL TUNNEL RELEASE Left   . CARPAL TUNNEL RELEASE Right 2007  . CATARACT EXTRACTION Right 10/30/1996  . CORONARY ARTERY BYPASS GRAFT  2005  . CYSTOSCOPY  08/14/85  . ELBOW SURGERY     repair  . EPIDIDYMIS SURGERY  09/17/1985  . HERNIA REPAIR Right 05/19/1976  . PACEMAKER GENERATOR CHANGE   10/29/12   Dr. Johney Frame  . PACEMAKER GENERATOR CHANGE N/A 10/29/2012   Procedure: PACEMAKER GENERATOR CHANGE;  Surgeon: Hillis Range, MD;  Location: [facility] CATH LAB;  Service: Cardiovascular;  Laterality: N/A;  . PACEMAKER INSERTION  10/14/2003;10/29/2012   MDT Implanted by Dr Amil Amen for SSS; generator change 10/29/2012 by Dr Johney Frame [employer] [place]  . REPLACEMENT TOTAL KNEE BILATERAL  1992 and 1996  . ROTATOR CUFF REPAIR Right 2008  . SHOULDER OPEN ROTATOR CUFF REPAIR Left 02/07/1996  . SIGMOIDOSCOPY  08/26/2001  . TOE SURGERY Right 2005   correction  . TRANSURETHRAL RESECTION OF PROSTATE  07/27/1983  . YAG LASER APPLICATION Right 12/26/1973   eye  . YAG LASER APPLICATION Right 04/30/2001  . YAG LASER APPLICATION Left 05/07/2001    Social History   Socioeconomic History  . Marital status: Single    Spouse name: Not on file  . Number of children: Not on file  . Years of education: Not on file  . Highest education level: Not on file  Occupational History  . Occupation: retired IT trainer  Social Needs  . Financial resource strain: Not on file  . Food insecurity:    Worry: Not on file    Inability: Not on file  . Transportation needs:    Medical: Not on file    Non-medical: Not on file  Tobacco Use  . Smoking status: Never Smoker  . Smokeless tobacco: Never Used  Substance and Sexual Activity  . Alcohol use: No  . Drug use: No  . Sexual activity: Never  Lifestyle  . Physical activity:    Days per week: Not on file    Minutes per session: Not on file  . Stress: Not on file  Relationships  . Social connections:    Talks on phone: Not on file    Gets together: Not on file    Attends religious service: Not on file    Active member of club or organization: Not on file    Attends meetings of clubs or organizations: Not on file    Relationship status: Not on file  . Intimate partner violence:    Fear of current or ex partner: Not on file    Emotionally abused: Not on file    Physically abused:  Not on file    Forced sexual activity: Not on file  Other Topics Concern  . Not on file  Social History Narrative   Pt lives in assisted living at [facility] following a fall 12/13.   Retired IT trainer.   Widowed   No siblings    MOST  Form signed   DNR   Walks with walker   Exercise -self in room each morning     Allergies  Allergen Reactions  . Aleve [Naproxen Sodium]   . Celebrex [Celecoxib]     Lips swelled  . Sulfur     Lips swelled  . Vioxx [Rofecoxib] Other (See Comments)    unknown    Family History  Problem Relation Age of Onset  . CVA Mother   . CVA Father   . Cancer Unknown     Prior to Admission medications   Medication Sig Start Date End Date Taking? Authorizing Provider  acetaminophen (TYLENOL) 325 MG tablet Take 650 mg by mouth every 4 (four) hours as needed.   Yes [provider]  amoxicillin (AMOXIL) 500 MG tablet Take 2,000 mg by mouth as needed. Take one tablet one hour prior to surgery    Yes [provider]  aspirin 81 MG tablet Take 81 mg by mouth daily.   Yes [provider]  bimatoprost (LUMIGAN) 0.01 % SOLN Place 1 drop into both eyes at bedtime.    Yes [provider]  carbamide peroxide (DEBROX) 6.5 % otic solution 5 drops into affected ear for 3 nights as needed for ear wax at bedtime   Yes [provider]  finasteride (PROSCAR) 5 MG tablet Take 5 mg by mouth daily. Take one tablet by mouth once daily    Yes [provider]  fluticasone (FLONASE) 50 MCG/ACT nasal spray Place 1 spray into both nostrils daily.  11/27/13  Yes [provider]  furosemide (LASIX) 20 MG tablet Take 20 mg by mouth 2 (two) times daily as needed for fluid or edema (for increased weight of 2-3 lbs).   Yes [provider]  guaiFENesin (MUCINEX) 600 MG 12 hr tablet Take 600 mg by mouth 2 (two) times daily.    Yes [provider]  hydroxypropyl methylcellulose / hypromellose (ISOPTO TEARS /  GONIOVISC) 2.5 % ophthalmic solution Place 1 drop into both eyes 4 (four) times daily as needed (macular degeneration).   Yes [provider]  KLOR-CON M10 10 MEQ tablet Take 1 tablet by mouth twice daily  as needed with Lasix 12/04/12  Yes [provider]  lisinopril (PRINIVIL,ZESTRIL) 20 MG tablet Take 20 mg by mouth daily. Take one tablet daily for blood pressure 12/13/13  Yes [provider]  loperamide (IMODIUM) 2 MG capsule Take 4 mg by mouth as needed for diarrhea or loose stools.   Yes [provider]  loratadine (CLARITIN) 10 MG tablet Take 10 mg by mouth daily. Reported on 07/13/2015   Yes [provider]  magnesium hydroxide (MILK OF MAGNESIA) 400 MG/5ML suspension Take 30 mLs by mouth as needed for mild constipation or moderate constipation (MOM 30 ml po, QD PRN 24 hours, Ckeck for hard stool in the rectum. Remove digitally of present. Fleet's enema for rectum c1 dose ater removing hard stool from rectum. DO NOT USE FOR CHRONIC KIDNEY DISEASE stage 4 or HEMODIALSIS PT.).    Yes [provider]  methadone (DOLOPHINE) 5 MG tablet Take one tablet by mouth every morning for pain; Take One and Half tablet (7.5mg ) by mouth every evening for pain. Patient taking differently: Take 5-7.5 mg by mouth See admin instructions. Take 5 mg  tablet by mouth every morning for pain; Take  (7.5mg ) by mouth every evening for pain. 10/19/17  Yes Oneal Grout, MD  metoprolol tartrate (LOPRESSOR) 25 MG tablet Take 25 mg by mouth 2 (two) times daily.   Yes [provider]  Multiple Vitamins-Minerals (ICAPS PO) Take 1 tablet by mouth daily.    Yes [provider]  naproxen sodium (ANAPROX) 220 MG tablet Take 220 mg by mouth 2 (two) times daily.   Yes [provider]  nitroGLYCERIN (NITROSTAT) 0.4 MG SL tablet Place 0.4 mg under the tongue every  5 (five) minutes as needed. Reported on 07/13/2015   Yes [provider]  omeprazole  (PRILOSEC) 20 MG capsule Take 20 mg by mouth daily.   Yes [provider]  phenylephrine (,USE FOR PREPARATION-H,) 0.25 % suppository Place 1 suppository rectally as needed for hemorrhoids (Apply to hemorrohids area up to 6 x day PRn for Hemorrhoids).   Yes [provider]  Polyethyl Glycol-Propyl Glycol 0.4-0.3 % SOLN Apply 1 drop to eye 2 (two) times daily. Both eyes   Yes [provider]  polyethylene glycol (MIRALAX / GLYCOLAX) packet Take 17 g by mouth at bedtime.    Yes [provider]  potassium chloride (K-DUR) 10 MEQ tablet Take 10 mEq by mouth 2 (two) times daily as needed.   Yes [provider]  saccharomyces boulardii (FLORASTOR) 250 MG capsule Take 250 mg by mouth daily.   Yes [provider]  sertraline (ZOLOFT) 25 MG tablet Take 25 mg by mouth at bedtime.   Yes [provider]  Tamsulosin HCl (FLOMAX) 0.4 MG CAPS Take 0.4 mg by mouth daily.   Yes [provider]  zolpidem (AMBIEN) 5 MG tablet Take one tablet by mouth at bedtime for rest 07/09/17  Yes Oneal GroutPandey, Mahima, MD  furosemide (LASIX) 20 MG tablet Take 20 mg by mouth 2 (two) times daily as needed for fluid or edema (Take one tablet by mouth 2 times a day as needed for increaseed weight og 2-3 lbs).     [provider]  lidocaine (ASPERCREME W/LIDOCAINE) 4 % cream Apply morning and evening to painful area of the lower back Patient not taking: Reported on 11/19/2017 07/13/15   Kimber RelicGreen, Markas G, MD    Physical Exam: Vitals:   11/19/17 1500 11/19/17 1515 11/19/17 1530 11/19/17 1545  BP: 127/60 122/68 (!) 105/54 124/67  Pulse:  (!) 57  79  Resp: 15 (!) 22 19 (!) 22  Temp:      TempSrc:      SpO2:  (!) 81%  95%  Weight:      Height:         General: Obtunded, awakens eyes and makes 1-word nonsensical responses to questions Eyes:  PERRL, EOMI, normal lids, iris ENT: Dry lips & tongue, dry mm Neck:  no LAD, masses or thyromegaly Cardiovascular:   RRR, no m/r/g. 1+ LE edema.  Respiratory:   CTA bilaterally with no wheezes/rales/rhonchi.  Normal respiratory effort. Abdomen:  soft, NT, ND, NABS Skin:  no rash or induration seen on limited exam Musculoskeletal:  grossly normal tone BUE/BLE, good ROM, no bony abnormality Lower extremity:   1+ LE edema.  Limited foot exam with no ulcerations.  2+ distal pulses. Psychiatric: Obtunded, nonsensical speech Neurologic: Unable to perform    Radiological Exams on Admission: Ct Abdomen Pelvis Wo Contrast  Result Date: 11/19/2017 CLINICAL DATA:  Abdominal pain EXAM: CT ABDOMEN AND PELVIS WITHOUT CONTRAST TECHNIQUE: Multidetector CT imaging of the abdomen and pelvis was performed following the standard protocol without IV contrast. COMPARISON:  None. FINDINGS: Lower chest: Considerable left-sided pleural effusion is noted. This may be complicated in nature given its appearance superiorly. Some increased density is noted within the collapsed left lower lobe which may be related to aspirated material. Hepatobiliary: No focal liver abnormality is seen. No gallstones, gallbladder wall thickening, or biliary dilatation. Pancreas: Unremarkable. No pancreatic ductal dilatation or surrounding inflammatory changes. Spleen: Normal in size without focal abnormality. Adrenals/Urinary Tract: Adrenal glands are within normal limits. The kidneys demonstrate no  obstructive change. A nonobstructing left renal stone measuring 2 mm is noted. The ureters are within normal limits. The bladder is partially distended. Stomach/Bowel: Fecal material is noted within the rectum which may be related to a mild degree of impaction. No obstructive changes are seen. No inflammatory changes are identified. The appendix has been surgically removed Vascular/Lymphatic: Aortic atherosclerosis. No enlarged abdominal or pelvic lymph nodes. Reproductive: Prostate is within normal limits. Other: No abdominal wall hernia or abnormality. No  abdominopelvic ascites. Musculoskeletal: Degenerative changes of lumbar spine are seen. No acute bony abnormality is noted. IMPRESSION: Large left-sided pleural effusion with findings suggestive of aspiration and possible complicated nature of the effusion. Nonobstructing left renal stone. Fecal material within the rectum which may represent a mild impaction. Electronically Signed   By: Alcide Clever M.D.   On: 11/19/2017 14:41   Ct Head Wo Contrast  Result Date: 11/19/2017 CLINICAL DATA:  Altered mental status EXAM: CT HEAD WITHOUT CONTRAST TECHNIQUE: Contiguous axial images were obtained from the base of the skull through the vertex without intravenous contrast. COMPARISON:  CT head 04/21/2012 FINDINGS: Brain: Moderate atrophy with mild progression. Chronic microvascular ischemic changes in the white matter, with progression. Negative for acute infarct, hemorrhage, mass.  No midline shift. Vascular: Negative for hyperdense vessel Skull: Negative Sinuses/Orbits: Mild mucosal edema paranasal sinuses. Bilateral cataract surgery Other: None IMPRESSION: Atrophy and chronic microvascular ischemia have progressed since 2013. No acute abnormality. Electronically Signed   By: Marlan Palau M.D.   On: 11/19/2017 14:37   Dg Chest Portable 1 View  Result Date: 11/19/2017 CLINICAL DATA:  Acute mental status change. EXAM: PORTABLE CHEST 1 VIEW COMPARISON:  April 23, 2012 FINDINGS: There is significant opacity in the left lower lung with convex border superiorly. An oval density is also seen in the left upper lung. No pneumothorax. The cardiomediastinal silhouette is unchanged. No other acute abnormalities. IMPRESSION: There is significant opacity in the left mid and lower lung. While this could represent an unusual effusion with underlying opacity, an underlying pneumonia or mass are not excluded. The oval low-density finding in the left upper lobe could represent fluid in a fissure or a mass. The patient may benefit  from a PA and lateral chest x-ray or chest CT for further evaluation. Electronically Signed   By: Gerome Sam III M.D   On: 11/19/2017 12:53    EKG: Independently reviewed.  NSR with rate 98; nonspecific ST changes with no evidence of acute ischemia   Labs on Admission: I have personally reviewed the available labs and imaging studies at the time of the admission.  Pertinent labs:   CO2 21 Glucose 179 BUN 79/Creatinine 2.39/GFR 21 - baseline appears to be about 1.5 Albumin 2.2 AST 56/ALT 64 Normal NH4 Normal lipase Normal lactate Normal WBC count Hgb 9.6   Assessment/Plan Principal Problem:   HCAP (healthcare-associated pneumonia) Active Problems:   Benign prostatic hyperplasia (BPH) with urinary urgency   Constipation   Essential hypertension   Pleural effusion on left   Acute renal failure superimposed on stage 4 chronic kidney disease (HCC)   Goals of care, counseling/discussion   HCAP -Patient presenting from AL with progressive decline and subacute AMS -Imaging appears to indicate a large left pleural effusion with possible aspiration PNA -Due to possible plan for transition to comfort care measures only, palliative care consult requested; family was not available at the time of admission -For now, will provide antibiotic treatment with Vanc/Cefepime -No current evidence of sepsis  -Will  not plan for thoracentesis at this time given limited intervention strategies and lack of significant respiratory compromise evident on exam -Will admit to Med Surg - will get Procalcitonin - IVF: 1L of NS bolus in ED, followed by 100 mL per hour of NS - Zofran for Nausea  -Given concern for aspiration, will keep NPO until speech therapy swallow evaluation is performed -There does appear to be a possible LUL mass visualized on CXR but not on A/P CT - per ER PA, the family understands that further evaluation/treatment for a possible mass is not in accordance with his current  goals  BPH -Nursing staff was unable to pass a foley in the ER and so condom cath was placed -He has not made urine yet -Bladder scan shows roughly 450 cc of urine -Will see if nursing staff on the floor/ER staff is able to use a coude; if not, he is likely to need urology consult for foley placement  Acute renal failure on stage IV-V CKD  -Likely a combination of prerenal azotemia from decreased PO intake and post-obstructive from BPH -Will hydrate and attempt to place coude catheter -Will avoid nephrotoxic agents when possible -Follow daily BMP  Constipation -Soap suds enema prn -Preparation H prn  HTN -Hold ACE due to renal failure -Use IV lopressor BID for now  Goals of care -Family not available at time of admission -Per ER staff, they currently desire gentle treatment efforts with plan to transition to comfort care if patient does not respond -Palliative care consult placed by ER -He is DNR and has a MOST form    DVT prophylaxis: Lovenox  Code Status:  DNR  Family Communication: None present  Disposition Plan:  To be determined Consults called: Palliative care; speech therapy Admission status: Admit - It is my clinical opinion that admission to INPATIENT is reasonable and necessary because of the expectation that this patient will require hospital care that crosses at least 2 midnights to treat this condition based on the medical complexity of the problems presented.  Given the aforementioned information, the predictability of an adverse outcome is felt to be significant.    Jonah Blue MD [facility]  If note is complete, please contact covering daytime or nighttime physician. [email or web address] Password [email or web address]  11/19/2017, 4:16 PM

## 2017-11-19 NOTE — Progress Notes (Signed)
11/19/2017  Patient transfer from the emergency room to 2W21 he is alert, oriented to self. Patient right now is non ambulatory. Foley was placed in the emergency room because patient was having urinary retention. Patient have bruises on arms, behind right knee and moisture associated skin damage on the sacrum. Barrier cream was applied and foam dressing was place on sacrum. [facility] Surgicenter IncNadine Yashua Bracco RN.

## 2017-11-19 NOTE — ED Notes (Signed)
Bladder scan pt 467 mL. RN notified.

## 2017-11-19 NOTE — ED Notes (Signed)
Attempted report 

## 2017-11-19 NOTE — ED Notes (Signed)
Family in room at this time. 

## 2017-11-19 NOTE — ED Provider Notes (Signed)
[facility] EMERGENCY DEPARTMENT Provider Note   CSN: [id number] Arrival date & time: 11/19/17  1040     History   Chief Complaint Chief Complaint  Patient presents with  . Weakness    HPI BROWN DUNLAP is a 84 y.o. male.  HPI   Level 5 caveat due to altered mental status.  NASHUA HOMEWOOD is a 84 y.o. male, with a history of HTN, GERD, CHF, MI, pacemaker, presenting to the ED with altered mental status over the last 24 hours.  Arrives via EMS from assisted living facility. Patient's daughter and granddaughter are at the bedside and provide all the history. Patient has reportedly had less activity and generalized weakness over the past 2 weeks.  Decreased appetite and oral intake over the last few days.  Was responding normally as of yesterday, but the facility contacted patient's family today and let them know that he was responding less appropriately and had mild tachycardia. Last bowel movement was reportedly prior to arrival.  Facility does report abnormal urine smell.  Patient was treated for pneumonia 3 weeks ago and has not had a cough since then.  They deny knowledge of recent complaints, fever, nausea, vomiting, urinary symptoms, abdominal pain, chest pain, shortness of breath, falls/trauma, or any other complaints.    Past Medical History:  Diagnosis Date  . Abnormality of gait 06/11/2012  . Alcohol abuse 04/26/2012  . Allergy, unspecified not elsewhere classified 04/28/2012  . Atrial fibrillation (HCC) 04/28/2012  . BPH (benign prostatic hyperplasia)   . Cardiac pacemaker in situ 04/28/2012  . CHF (congestive heart failure) (HCC) 04/23/2012  . Chronic airway obstruction, not elsewhere classified 04/26/2012  . Coronary atherosclerosis of native coronary artery 04/28/2012  . Degenerative joint disease 04/21/2012  . Depression    mild per pt  . Essential hypertension 10/17/2012  . GERD (gastroesophageal reflux disease) 09/13/2012  . History of  rhabdomyolysis    s/p fall with prolonged downtime  . Hyperlipidemia   . Ingrowing nail 06/11/2012  . Insomnia 11/15/2012  . Insomnia, unspecified 04/28/2012  . Lumbago   . Macular degeneration   . Muscle weakness (generalized) 04/26/2012  . Osteoarthritis   . Other specified disease of white blood cells 04/26/2012  . Paroxysmal atrial tachycardia (HCC) 04/21/2012   Brief episodes. Not felt to be an anticoagulation candidate because of age and frailty   . Pericarditis 04/21/2012  . Personal history of fall 04/28/2012  . Reflux esophagitis 04/28/2012  . Seborrheic eczema 12/03/2012  . Sick sinus syndrome (HCC) 10/14/2003; 10/29/2012   MDT EnPulse implanted by Dr Amil Amen for SSS; generator change 10/29/2012 by Dr Johney Frame MDT Jana Half pacemaker  . Sporotrichosis    remote  . Synovial cyst of wrist 11/10/2014   Left wrist    . Tachy-brady syndrome (HCC) 04/21/2012   Pacemaker is near EOL   . Unspecified constipation 09/13/2012  . Unspecified glaucoma(365.9) 04/28/2012    Patient Active Problem List   Diagnosis Date Noted  . HCAP (healthcare-associated pneumonia) 11/19/2017  . Plantar fasciitis, left 09/21/2017  . Hypertensive heart disease with heart failure (HCC) 03/19/2017  . Congestive heart failure (CHF) (HCC) 12/12/2016  . Hypertensive kidney disease with CKD (chronic kidney disease) stage V (HCC) 05/23/2016  . Heel pain 07/13/2015  . Spinal stenosis of lumbar region 12/15/2014  . Synovial cyst of wrist 11/10/2014  . Edema 07/07/2014  . Cough 07/07/2014  . Seborrheic keratosis 01/13/2014  . Flatulence 01/13/2014  . DNR (do not resuscitate) 11/28/2013  .  Restless leg 10/21/2013  . Dysphagia, unspecified(787.20) 09/09/2013  . Osteoarthritis   . Lumbago   . Hip pain, right 09/02/2013  . Seborrheic eczema 12/03/2012  . Insomnia 11/15/2012  . Sick sinus syndrome (HCC) 10/17/2012  . Essential hypertension 10/17/2012  . Constipation 09/13/2012  . GERD (gastroesophageal reflux disease)  09/13/2012  . Hypertensive heart and chronic kidney disease with heart failure and with stage 5 chronic kidney disease, or end stage renal disease (HCC) 04/23/2012  . NSTEMI (non-ST elevated myocardial infarction) (HCC) 04/23/2012    Class: Acute  . Pericarditis 04/21/2012    Class: Acute  . CAD (coronary artery disease) 04/21/2012    Class: Chronic  . Tachy-brady syndrome (HCC) 04/21/2012    Class: Chronic  . Fall 04/21/2012    Class: Acute  . Pacemaker 04/21/2012    Class: Chronic  . Major depression, chronic 04/21/2012    Class: Chronic  . Paroxysmal atrial tachycardia (HCC) 04/21/2012    Class: Chronic  . Macular degeneration 04/21/2012    Class: Chronic  . Degenerative joint disease 04/21/2012    Class: Chronic  . Benign prostatic hyperplasia (BPH) with urinary urgency     Past Surgical History:  Procedure Laterality Date  . ANGIOPLASTY  9/11/190   Dr. Katrinka BlazingSmith  . APPENDECTOMY    . BACK SURGERY  03/11/2001  . bilateral knee arthroplasty  09/28/83; 05/17/1992   right then left  . CARPAL TUNNEL RELEASE Left   . CARPAL TUNNEL RELEASE Right 2007  . CATARACT EXTRACTION Right 10/30/1996  . CORONARY ARTERY BYPASS GRAFT  2005  . CYSTOSCOPY  08/14/85  . ELBOW SURGERY     repair  . EPIDIDYMIS SURGERY  09/17/1985  . HERNIA REPAIR Right 05/19/1976  . PACEMAKER GENERATOR CHANGE  10/29/12   Dr. Johney FrameAllred  . PACEMAKER GENERATOR CHANGE N/A 10/29/2012   Procedure: PACEMAKER GENERATOR CHANGE;  Surgeon: Hillis RangeJames Allred, MD;  [facility];  Service: Cardiovascular;  Laterality: N/A;  . PACEMAKER INSERTION  10/14/2003;10/29/2012   MDT Implanted by Dr Amil AmenEdmunds for SSS; generator change 10/29/2012 by Dr Johney FrameAllred [employer]  . REPLACEMENT TOTAL KNEE BILATERAL  1992 and 1996  . ROTATOR CUFF REPAIR Right 2008  . SHOULDER OPEN ROTATOR CUFF REPAIR Left 02/07/1996  . SIGMOIDOSCOPY  08/26/2001  . TOE SURGERY Right 2005   correction  . TRANSURETHRAL RESECTION OF PROSTATE  07/27/1983  . YAG LASER  APPLICATION Right 12/26/1973   eye  . YAG LASER APPLICATION Right 04/30/2001  . YAG LASER APPLICATION Left 05/07/2001        Home Medications    Prior to Admission medications   Medication Sig Start Date End Date Taking? Authorizing Provider  acetaminophen (TYLENOL) 325 MG tablet Take 650 mg by mouth every 4 (four) hours as needed.   Yes [provider]  amoxicillin (AMOXIL) 500 MG tablet Take 2,000 mg by mouth as needed. Take one tablet one hour prior to surgery    Yes [provider]  aspirin 81 MG tablet Take 81 mg by mouth daily.   Yes [provider]  bimatoprost (LUMIGAN) 0.01 % SOLN Place 1 drop into both eyes at bedtime.    Yes [provider]  carbamide peroxide (DEBROX) 6.5 % otic solution 5 drops into affected ear for 3 nights as needed for ear wax at bedtime   Yes [provider]  finasteride (PROSCAR) 5 MG tablet Take 5 mg by mouth daily. Take one tablet by mouth once daily    Yes [provider]  fluticasone (FLONASE) 50 MCG/ACT nasal spray Place 1 spray into both nostrils daily.  11/27/13  Yes [provider]  furosemide (LASIX) 20 MG tablet Take 20 mg by mouth 2 (two) times daily as needed for fluid or edema (for increased weight of 2-3 lbs).   Yes [provider]  guaiFENesin (MUCINEX) 600 MG 12 hr tablet Take 600 mg by mouth 2 (two) times daily.    Yes [provider]  hydroxypropyl methylcellulose / hypromellose (ISOPTO TEARS / GONIOVISC) 2.5 % ophthalmic solution Place 1 drop into both eyes 4 (four) times daily as needed (macular degeneration).   Yes [provider]  KLOR-CON M10 10 MEQ tablet Take 1 tablet by mouth twice daily  as needed with Lasix 12/04/12  Yes [provider]  lisinopril (PRINIVIL,ZESTRIL) 20 MG tablet Take 20 mg by mouth daily. Take one tablet daily for blood pressure 12/13/13  Yes [provider]  loperamide (IMODIUM) 2 MG capsule Take 4 mg by mouth as  needed for diarrhea or loose stools.   Yes [provider]  loratadine (CLARITIN) 10 MG tablet Take 10 mg by mouth daily. Reported on 07/13/2015   Yes [provider]  magnesium hydroxide (MILK OF MAGNESIA) 400 MG/5ML suspension Take 30 mLs by mouth as needed for mild constipation or moderate constipation (MOM 30 ml po, QD PRN 24 hours, Ckeck for hard stool in the rectum. Remove digitally of present. Fleet's enema for rectum c1 dose ater removing hard stool from rectum. DO NOT USE FOR CHRONIC KIDNEY DISEASE stage 4 or HEMODIALSIS PT.).    Yes [provider]  methadone (DOLOPHINE) 5 MG tablet Take one tablet by mouth every morning for pain; Take One and Half tablet (7.5mg ) by mouth every evening for pain. Patient taking differently: Take 5-7.5 mg by mouth See admin instructions. Take 5 mg  tablet by mouth every morning for pain; Take  (7.5mg ) by mouth every evening for pain. 10/19/17  Yes Oneal Grout, MD  metoprolol tartrate (LOPRESSOR) 25 MG tablet Take 25 mg by mouth 2 (two) times daily.   Yes [provider]  Multiple Vitamins-Minerals (ICAPS PO) Take 1 tablet by mouth daily.    Yes [provider]  nitroGLYCERIN (NITROSTAT) 0.4 MG SL tablet Place 0.4 mg under the tongue every 5 (five) minutes as needed. Reported on 07/13/2015   Yes [provider]  omeprazole (PRILOSEC) 20 MG capsule Take 20 mg by mouth daily.   Yes [provider]  phenylephrine (,USE FOR PREPARATION-H,) 0.25 % suppository Place 1 suppository rectally as needed for hemorrhoids (Apply to hemorrohids area up to 6 x day PRn for Hemorrhoids).   Yes [provider]  Polyethyl Glycol-Propyl Glycol 0.4-0.3 % SOLN Apply 1 drop to eye 2 (two) times daily. Both eyes   Yes [provider]  polyethylene glycol (MIRALAX / GLYCOLAX) packet Take 17 g by mouth at bedtime.    Yes [provider]  potassium chloride (K-DUR) 10 MEQ tablet Take 10 mEq by mouth 2  (two) times daily as needed.   Yes [provider]  saccharomyces boulardii (FLORASTOR) 250 MG capsule Take 250 mg by mouth daily.   Yes [provider]  sertraline (ZOLOFT) 25 MG tablet Take 25 mg by mouth at bedtime.   Yes [provider]  Tamsulosin HCl (FLOMAX) 0.4 MG CAPS Take 0.4 mg by mouth daily.   Yes [provider]  zolpidem (AMBIEN) 5 MG tablet Take one tablet  by mouth at bedtime for rest 07/09/17  Yes Oneal Grout, MD    Family History Family History  Problem Relation Age of Onset  . CVA Mother   . CVA Father   . Cancer Unknown     Social History Social History   Tobacco Use  . Smoking status: Never Smoker  . Smokeless tobacco: Never Used  Substance Use Topics  . Alcohol use: No  . Drug use: No     Allergies   Aleve [naproxen sodium]; Celebrex [celecoxib]; Sulfur; and Vioxx [rofecoxib]   Review of Systems Review of Systems  Unable to perform ROS: Mental status change     Physical Exam Updated Vital Signs BP (!) 99/48   Pulse 76   Temp 97.8 F (36.6 C) (Oral)   Resp (!) 25   Ht 5\' 10"  (1.778 m)   Wt 77.1 kg (170 lb)   SpO2 100%   BMI 24.39 kg/m   Physical Exam  Constitutional: He appears cachectic. He has a sickly appearance. No distress.  HENT:  Head: Normocephalic and atraumatic.  Eyes: Conjunctivae are normal.  Pupils approximately 2 mm, equal bilaterally.  Neck: Neck supple.  Cardiovascular: Normal rate, regular rhythm, normal heart sounds and intact distal pulses.  Pulmonary/Chest: Effort normal and breath sounds normal. No respiratory distress.  Abdominal: Soft. There is no tenderness. There is no guarding.  Musculoskeletal: He exhibits no edema.  Lymphadenopathy:    He has no cervical adenopathy.  Neurological:  Patient appears somnolent.  Will not follow commands. Noted to have motor function in each of his extremities.  Skin: Skin is warm and dry. He is not diaphoretic.  Psychiatric: He has a  normal mood and affect. His behavior is normal.  Nursing note and vitals reviewed.    ED Treatments / Results  Labs (all labs ordered are listed, but only abnormal results are displayed) Labs Reviewed  COMPREHENSIVE METABOLIC PANEL - Abnormal; Notable for the following components:      Result Value   Chloride 112 (*)    CO2 21 (*)    Glucose, Bld 179 (*)    BUN 79 (*)    Creatinine, Ser 2.39 (*)    Calcium 8.5 (*)    Total Protein 6.2 (*)    Albumin 2.2 (*)    AST 56 (*)    ALT 64 (*)    Alkaline Phosphatase 135 (*)    GFR calc non Af Amer 21 (*)    GFR calc Af Amer 24 (*)    All other components within normal limits  CBC WITH DIFFERENTIAL/PLATELET - Abnormal; Notable for the following components:   RBC 2.96 (*)    Hemoglobin 9.6 (*)    HCT 30.4 (*)    MCV 102.7 (*)    Neutro Abs 8.9 (*)    All other components within normal limits  URINE CULTURE  CULTURE, BLOOD (SINGLE)  LIPASE, BLOOD  AMMONIA  URINALYSIS, ROUTINE W REFLEX MICROSCOPIC  STREP PNEUMONIAE URINARY ANTIGEN  I-STAT CG4 LACTIC ACID, ED    EKG EKG Interpretation  Date/Time:  Monday November 19 2017 10:52:32 EDT Ventricular Rate:  98 PR Interval:    QRS Duration: 161 QT Interval:  447 QTC Calculation: 571 R Axis:   -158 Text Interpretation:  Sinus rhythm Prolonged PR interval Nonspecific intraventricular conduction delay Anterolateral infarct, old Since last EKG, rate has increased Otherwise no significant change Confirmed by Shaune Pollack [phone]) on 11/19/2017 12:40:26 PM   Radiology Ct Abdomen Pelvis Wo Contrast  Result Date: 11/19/2017 CLINICAL DATA:  Abdominal pain EXAM: CT ABDOMEN AND PELVIS WITHOUT CONTRAST TECHNIQUE: Multidetector CT imaging of the abdomen and pelvis was performed following the standard protocol without IV contrast. COMPARISON:  None. FINDINGS: Lower chest: Considerable left-sided pleural effusion is noted. This may be complicated in nature given its appearance superiorly. Some  increased density is noted within the collapsed left lower lobe which may be related to aspirated material. Hepatobiliary: No focal liver abnormality is seen. No gallstones, gallbladder wall thickening, or biliary dilatation. Pancreas: Unremarkable. No pancreatic ductal dilatation or surrounding inflammatory changes. Spleen: Normal in size without focal abnormality. Adrenals/Urinary Tract: Adrenal glands are within normal limits. The kidneys demonstrate no obstructive change. A nonobstructing left renal stone measuring 2 mm is noted. The ureters are within normal limits. The bladder is partially distended. Stomach/Bowel: Fecal material is noted within the rectum which may be related to a mild degree of impaction. No obstructive changes are seen. No inflammatory changes are identified. The appendix has been surgically removed Vascular/Lymphatic: Aortic atherosclerosis. No enlarged abdominal or pelvic lymph nodes. Reproductive: Prostate is within normal limits. Other: No abdominal wall hernia or abnormality. No abdominopelvic ascites. Musculoskeletal: Degenerative changes of lumbar spine are seen. No acute bony abnormality is noted. IMPRESSION: Large left-sided pleural effusion with findings suggestive of aspiration and possible complicated nature of the effusion. Nonobstructing left renal stone. Fecal material within the rectum which may represent a mild impaction. Electronically Signed   By: Alcide Clever M.D.   On: 11/19/2017 14:41   Ct Head Wo Contrast  Result Date: 11/19/2017 CLINICAL DATA:  Altered mental status EXAM: CT HEAD WITHOUT CONTRAST TECHNIQUE: Contiguous axial images were obtained from the base of the skull through the vertex without intravenous contrast. COMPARISON:  CT head 04/21/2012 FINDINGS: Brain: Moderate atrophy with mild progression. Chronic microvascular ischemic changes in the white matter, with progression. Negative for acute infarct, hemorrhage, mass.  No midline shift. Vascular:  Negative for hyperdense vessel Skull: Negative Sinuses/Orbits: Mild mucosal edema paranasal sinuses. Bilateral cataract surgery Other: None IMPRESSION: Atrophy and chronic microvascular ischemia have progressed since 2013. No acute abnormality. Electronically Signed   By: Marlan Palau M.D.   On: 11/19/2017 14:37   Dg Chest Portable 1 View  Result Date: 11/19/2017 CLINICAL DATA:  Acute mental status change. EXAM: PORTABLE CHEST 1 VIEW COMPARISON:  April 23, 2012 FINDINGS: There is significant opacity in the left lower lung with convex border superiorly. An oval density is also seen in the left upper lung. No pneumothorax. The cardiomediastinal silhouette is unchanged. No other acute abnormalities. IMPRESSION: There is significant opacity in the left mid and lower lung. While this could represent an unusual effusion with underlying opacity, an underlying pneumonia or mass are not excluded. The oval low-density finding in the left upper lobe could represent fluid in a fissure or a mass. The patient may benefit from a PA and lateral chest x-ray or chest CT for further evaluation. Electronically Signed   By: Gerome Sam III M.D   On: 11/19/2017 12:53    Procedures Procedures (including critical care time)  Medications Ordered in ED Medications  azithromycin (ZITHROMAX) 500 mg in sodium chloride 0.9 % 250 mL IVPB (500 mg Intravenous New Bag/Given 11/19/17 1553)  aspirin tablet 81 mg (has no administration in time range)  latanoprost (XALATAN) 0.005 % ophthalmic solution 1 drop (has no administration in time range)  finasteride (PROSCAR) tablet 5 mg (has no administration in time range)  fluticasone (FLONASE) 50 MCG/ACT  nasal spray 1 spray (has no administration in time range)  hydroxypropyl methylcellulose / hypromellose (ISOPTO TEARS / GONIOVISC) 2.5 % ophthalmic solution 1 drop (has no administration in time range)  metoprolol tartrate (LOPRESSOR) tablet 25 mg (has no administration in time  range)  pantoprazole (PROTONIX) EC tablet 40 mg (has no administration in time range)  phenylephrine ((USE for PREPARATION-H)) 0.25 % suppository 1 suppository (has no administration in time range)  Polyethyl Glycol-Propyl Glycol 0.4-0.3 % SOLN 1 drop (has no administration in time range)  polyethylene glycol (MIRALAX / GLYCOLAX) packet 17 g (has no administration in time range)  sertraline (ZOLOFT) tablet 25 mg (has no administration in time range)  tamsulosin (FLOMAX) capsule 0.4 mg (has no administration in time range)  0.9 %  sodium chloride infusion (has no administration in time range)  enoxaparin (LOVENOX) injection 30 mg (has no administration in time range)  ceFEPIme (MAXIPIME) 1 g in sodium chloride 0.9 % 100 mL IVPB (has no administration in time range)  insulin aspart (novoLOG) injection 0-9 Units (has no administration in time range)  sodium chloride 0.9 % bolus 1,000 mL (0 mLs Intravenous Stopped 11/19/17 1545)  cefTRIAXone (ROCEPHIN) 1 g in sodium chloride 0.9 % 100 mL IVPB (0 g Intravenous Stopped 11/19/17 1522)     Initial Impression / Assessment and Plan / ED Course  I have reviewed the triage vital signs and the nursing notes.  Pertinent labs & imaging results that were available during my care of the patient were reviewed by me and considered in my medical decision making (see chart for details).  Clinical Course as of Nov 20 1606  Mon Nov 19, 2017  1231 Noreene Larsson, granddaughter, also at bedside. [phone] home [phone] cell (use only in emergency)   [SJ]  1343 Updated family on lab results   [SJ]  1442 Hemoglobin seems to be dropping slowly over time.  Hemoglobin(!): 9.6 [SJ]  1511 Spoke with Melanie, palliative care team. States she will have someone from her team follow up with the patient tomorrow.    [SJ]  1551 yates   [SJ]    Clinical Course User Index [SJ] Rabon Scholle C, PA-C    Patient presents with decline in health and mental status. Extensive  discussion was had regarding goals of care.  Family states they would not be opposed to initiating palliative care with an option for possible hospice care depending on the work-up today.  We agreed we would provide treatment for comfort and for acute findings, but not for more long-term issues, such as strokes, cancer, etc. Single blood culture obtained due to family's request to minimize needlesticks. Patient admitted for further management of his AKI and possible pneumonia.  Findings and plan of care discussed with Shaune Pollack, MD. Dr. Erma Heritage personally evaluated and examined this patient.   Final Clinical Impressions(s) / ED Diagnoses   Final diagnoses:  AKI (acute kidney injury) (HCC)  Community acquired pneumonia of left lower lobe of lung [facility])    ED Discharge Orders    None       Concepcion Living 11/19/17 1610    Shaune Pollack, MD 11/20/17 [phone]

## 2017-11-19 NOTE — ED Notes (Signed)
Patient transported to CT 

## 2017-11-19 NOTE — ED Notes (Signed)
No UA in condom cath bag

## 2017-11-19 NOTE — Progress Notes (Addendum)
Pharmacy Antibiotic Note  Caleb Nash is a [age] y.o. male admitted on 11/19/2017 with pneumonia.  Pharmacy has been consulted for Vancomycin dosing.  Plan: Vancomycin 1500mg  IV once then 100mg  IV every 48 hours.  Goal trough 15-20 mcg/mL. Monitor clinical progression and LOT Order troughs as appropriate  Height: 5\' 10"  (177.8 cm) Weight: 170 lb (77.1 kg) IBW/kg (Calculated) : 73  Temp (24hrs), Avg:97.8 F (36.6 C), Min:97.8 F (36.6 C), Max:97.8 F (36.6 C)  Recent Labs  Lab 11/19/17 1150 11/19/17 1300  WBC 10.4  --   CREATININE 2.39*  --   LATICACIDVEN  --  1.70    Estimated Creatinine Clearance: 16.5 mL/min (A) (by C-G formula based on SCr of 2.39 mg/dL (H)).    Allergies  Allergen Reactions  . Aleve [Naproxen Sodium]   . Celebrex [Celecoxib]     Lips swelled  . Sulfur     Lips swelled  . Vioxx [Rofecoxib] Other (See Comments)    unknown    Thank you for allowing pharmacy to be a part of this patient's care.  Toniann Failony L Rudisill 11/19/2017 4:24 PM   Admitting MD decided to add cefepime to vanc but order wasn't added. He got rocephin/azith yesterday. Will add cefepime today.   Cefepime 1g IV q24  Ulyses SouthwardMinh Elius Etheredge, PharmD, [place], [place], CPP Infectious Disease Pharmacist Pager: [phone] 11/20/2017 9:31 AM

## 2017-11-19 NOTE — Progress Notes (Signed)
11/19/2017 patient admission was not completed, because patient is unable to give information. RN will completed admission with daughter in the am. Lovie MacadamiaNadine Talyn Dessert RN.

## 2017-11-19 NOTE — ED Triage Notes (Signed)
[facility] Last normal Friday. Usually uses walker. Per report urine "smells".

## 2017-11-20 ENCOUNTER — Encounter (HOSPITAL_COMMUNITY): Payer: Self-pay | Admitting: Emergency Medicine

## 2017-11-20 DIAGNOSIS — J90 Pleural effusion, not elsewhere classified: Secondary | ICD-10-CM

## 2017-11-20 DIAGNOSIS — R338 Other retention of urine: Secondary | ICD-10-CM

## 2017-11-20 LAB — GLUCOSE, CAPILLARY
GLUCOSE-CAPILLARY: 118 mg/dL — AB (ref 70–99)
GLUCOSE-CAPILLARY: 118 mg/dL — AB (ref 70–99)
Glucose-Capillary: 128 mg/dL — ABNORMAL HIGH (ref 70–99)
Glucose-Capillary: 130 mg/dL — ABNORMAL HIGH (ref 70–99)
Glucose-Capillary: 127 mg/dL — ABNORMAL HIGH (ref 70–99)
Glucose-Capillary: 117 mg/dL — ABNORMAL HIGH (ref 70–99)

## 2017-11-20 LAB — CBC WITH DIFFERENTIAL/PLATELET
Basophils Absolute: 0 10*3/uL (ref 0.0–0.1)
Basophils Relative: 0 %
EOS ABS: 0 10*3/uL (ref 0.0–0.7)
Eosinophils Relative: 0 %
HCT: 31.3 % — ABNORMAL LOW (ref 39.0–52.0)
Hemoglobin: 9.9 g/dL — ABNORMAL LOW (ref 13.0–17.0)
LYMPHS ABS: 1 10*3/uL (ref 0.7–4.0)
LYMPHS PCT: 8 %
MCH: 32.5 pg (ref 26.0–34.0)
MCHC: 31.6 g/dL (ref 30.0–36.0)
MCV: 102.6 fL — ABNORMAL HIGH (ref 78.0–100.0)
Monocytes Absolute: 0.9 10*3/uL (ref 0.1–1.0)
Monocytes Relative: 7 %
NEUTROS ABS: 10.9 10*3/uL — AB (ref 1.7–7.7)
Neutrophils Relative %: 85 %
Platelets: 271 10*3/uL (ref 150–400)
RBC: 3.05 MIL/uL — ABNORMAL LOW (ref 4.22–5.81)
RDW: 14.5 % (ref 11.5–15.5)
WBC Morphology: INCREASED
WBC: 12.8 10*3/uL — ABNORMAL HIGH (ref 4.0–10.5)

## 2017-11-20 LAB — URINE CULTURE: CULTURE: NO GROWTH

## 2017-11-20 LAB — BASIC METABOLIC PANEL
ANION GAP: 14 (ref 5–15)
BUN: 71 mg/dL — AB (ref 8–23)
CALCIUM: 8.3 mg/dL — AB (ref 8.9–10.3)
CO2: 17 mmol/L — ABNORMAL LOW (ref 22–32)
Chloride: 115 mmol/L — ABNORMAL HIGH (ref 98–111)
Creatinine, Ser: 2.1 mg/dL — ABNORMAL HIGH (ref 0.61–1.24)
GFR calc Af Amer: 28 mL/min — ABNORMAL LOW (ref >60)
GFR, EST NON AFRICAN AMERICAN: 24 mL/min — AB (ref >60)
GLUCOSE: 128 mg/dL — AB (ref 70–99)
Potassium: 4.2 mmol/L (ref 3.5–5.1)
SODIUM: 146 mmol/L — AB (ref 135–145)

## 2017-11-20 MED ORDER — ORAL CARE MOUTH RINSE
15.0000 mL | Freq: Two times a day (BID) | OROMUCOSAL | Status: DC
  Administered 2017-11-20 – 2017-11-22 (×5): 15 mL via OROMUCOSAL

## 2017-11-20 MED ORDER — SODIUM CHLORIDE 0.9 % IV SOLN
1.0000 g | INTRAVENOUS | Status: DC
  Administered 2017-11-20 – 2017-11-21 (×2): 1 g via INTRAVENOUS
  Filled 2017-11-20 (×2): qty 1

## 2017-11-20 MED ORDER — PNEUMOCOCCAL VAC POLYVALENT 25 MCG/0.5ML IJ INJ
0.5000 mL | INJECTION | INTRAMUSCULAR | Status: AC
  Administered 2017-11-21: 0.5 mL via INTRAMUSCULAR
  Filled 2017-11-20: qty 0.5

## 2017-11-20 MED ORDER — TAMSULOSIN HCL 0.4 MG PO CAPS
0.4000 mg | ORAL_CAPSULE | Freq: Every day | ORAL | Status: DC
  Filled 2017-11-20: qty 1

## 2017-11-20 MED ORDER — BISACODYL 10 MG RE SUPP
10.0000 mg | Freq: Once | RECTAL | Status: AC
  Administered 2017-11-20: 10 mg via RECTAL
  Filled 2017-11-20: qty 1

## 2017-11-20 NOTE — Evaluation (Signed)
Clinical/Bedside Swallow Evaluation Patient Details  Name: Caleb Nash MRN: [id number] Date of Birth: [birth year]  Today's Date: 11/20/2017 Time: SLP Start Time (ACUTE ONLY): 1330 SLP Stop Time (ACUTE ONLY): 1405 SLP Time Calculation (min) (ACUTE ONLY): 35 min  Past Medical History:  Past Medical History:  Diagnosis Date  . Abnormality of gait 06/11/2012  . Alcohol abuse 04/26/2012  . Allergy, unspecified not elsewhere classified 04/28/2012  . Atrial fibrillation (HCC) 04/28/2012  . BPH (benign prostatic hyperplasia)   . Cardiac pacemaker in situ 04/28/2012  . CHF (congestive heart failure) (HCC) 04/23/2012  . Chronic airway obstruction, not elsewhere classified 04/26/2012  . Coronary atherosclerosis of native coronary artery 04/28/2012  . Degenerative joint disease 04/21/2012  . Depression    mild per pt  . Essential hypertension 10/17/2012  . GERD (gastroesophageal reflux disease) 09/13/2012  . History of rhabdomyolysis    s/p fall with prolonged downtime  . Hyperlipidemia   . Ingrowing nail 06/11/2012  . Insomnia 11/15/2012  . Insomnia, unspecified 04/28/2012  . Lumbago   . Macular degeneration   . Muscle weakness (generalized) 04/26/2012  . Osteoarthritis   . Other specified disease of white blood cells 04/26/2012  . Paroxysmal atrial tachycardia (HCC) 04/21/2012   Brief episodes. Not felt to be an anticoagulation candidate because of age and frailty   . Pericarditis 04/21/2012  . Personal history of fall 04/28/2012  . Reflux esophagitis 04/28/2012  . Seborrheic eczema 12/03/2012  . Sick sinus syndrome (HCC) 10/14/2003; 10/29/2012   MDT EnPulse implanted by Dr Amil AmenEdmunds for SSS; generator change 10/29/2012 by Dr Johney FrameAllred MDT Jana [employer] pacemaker  . Sporotrichosis    remote  . Synovial cyst of wrist 11/10/2014   Left wrist    . Tachy-brady syndrome (HCC) 04/21/2012   Pacemaker is near EOL   . Unspecified constipation 09/13/2012  . Unspecified glaucoma(365.9) 04/28/2012   Past Surgical History:  Past  Surgical History:  Procedure Laterality Date  . ANGIOPLASTY  9/11/190   Dr. Katrinka BlazingSmith  . APPENDECTOMY    . BACK SURGERY  03/11/2001  . bilateral knee arthroplasty  09/28/83; 05/17/1992   right then left  . CARPAL TUNNEL RELEASE Left   . CARPAL TUNNEL RELEASE Right 2007  . CATARACT EXTRACTION Right 10/30/1996  . CORONARY ARTERY BYPASS GRAFT  2005  . CYSTOSCOPY  08/14/85  . ELBOW SURGERY     repair  . EPIDIDYMIS SURGERY  09/17/1985  . HERNIA REPAIR Right 05/19/1976  . PACEMAKER GENERATOR CHANGE  10/29/12   Dr. Johney FrameAllred  . PACEMAKER GENERATOR CHANGE N/A 10/29/2012   Procedure: PACEMAKER GENERATOR CHANGE;  Surgeon: Hillis RangeJames Allred, MD;  [facility];  Service: Cardiovascular;  Laterality: N/A;  . PACEMAKER INSERTION  10/14/2003;10/29/2012   MDT Implanted by Dr Amil AmenEdmunds for SSS; generator change 10/29/2012 by Dr Johney FrameAllred [employer] [place]  . REPLACEMENT TOTAL KNEE BILATERAL  1992 and 1996  . ROTATOR CUFF REPAIR Right 2008  . SHOULDER OPEN ROTATOR CUFF REPAIR Left 02/07/1996  . SIGMOIDOSCOPY  08/26/2001  . TOE SURGERY Right 2005   correction  . TRANSURETHRAL RESECTION OF PROSTATE  07/27/1983  . YAG LASER APPLICATION Right 12/26/1973   eye  . YAG LASER APPLICATION Right 04/30/2001  . YAG LASER APPLICATION Left 05/07/2001   HPI:  [age] y.o. male with medical history significant for PAT/afib not on anticoagulation; pacemaker placement; BPH; HTN; HLD; and CAD presenting with weakness.   He was brought in from his assisted living facility.  Evaluation raised concern for left-sided pneumonia and  pleural effusion.  Dx acute renal failure, HCAP.  Remote hx of dysphagia 2013 with MBS finding only mild deficits.    Assessment / Plan / Recommendation Clinical Impression  Pt presents with significant swallowing difficulty - there is consistent regurgitation of both applesauce and water, with coughing elicited with each bolus of water, regardless of size.  There are concerns for gross aspiration, as well as questionable  mechanical obstruction leading to regurgitation of undigested food. D/W Dr. Rito Ehrlich. Family will determine GOC after seeing how pt progresses.  Will proceed with MBS next date to determine nature of deficits; allow ice chips/small sips of water after oral care for tonight.  Will expedite MBS in am.    SLP Visit Diagnosis: Dysphagia, unspecified (R13.10)    Aspiration Risk       Diet Recommendation   npo except ice chips/small sips water       Other  Recommendations Oral Care Recommendations: Oral care QID;Oral care prior to ice chip/H20   Follow up Recommendations        Frequency and Duration            Prognosis        Swallow Study   General Date of Onset: 11/19/17 HPI: 84 y.o. male with medical history significant for PAT/afib not on anticoagulation; pacemaker placement; BPH; HTN; HLD; and CAD presenting with weakness.   He was brought in from his assisted living facility.  Evaluation raised concern for left-sided pneumonia and pleural effusion.  Dx acute renal failure, HCAP.  Remote hx of dysphagia 2013 with MBS finding only mild deficits.  Type of Study: Bedside Swallow Evaluation Diet Prior to this Study: NPO Temperature Spikes Noted: No Respiratory Status: Room air History of Recent Intubation: No Behavior/Cognition: Alert;Cooperative;Pleasant mood Oral Cavity Assessment: Dry Oral Care Completed by SLP: Recent completion by staff Oral Cavity - Dentition: Adequate natural dentition Vision: Functional for self-feeding Self-Feeding Abilities: Needs assist Patient Positioning: Upright in bed Baseline Vocal Quality: Normal Volitional Cough: Strong Volitional Swallow: Able to elicit    Oral/Motor/Sensory Function Overall Oral Motor/Sensory Function: Within functional limits   Ice Chips Ice chips: Within functional limits   Thin Liquid Thin Liquid: Impaired Presentation: Cup;Straw Oral Phase Functional Implications: Oral holding Pharyngeal  Phase Impairments:  Suspected delayed Swallow;Cough - Immediate    Nectar Thick Nectar Thick Liquid: Not tested   Honey Thick Honey Thick Liquid: Not tested   Puree Puree: Impaired Presentation: Spoon Pharyngeal Phase Impairments: Cough - Delayed   Solid     Solid: Not tested      Blenda Mounts Laurice 11/20/2017,2:13 PM    Marchelle Folks L. Samson Frederic, [place] CCC/SLP Pager [phone]

## 2017-11-20 NOTE — Progress Notes (Signed)
[facility] PROGRESS NOTE  Caleb Nash ZOX:[id number] DOB: 07/26/1913 DOA: 11/19/2017  PCP: Oneal Grout, MD  Brief History/Interval Summary: 84 y.o. male with medical history significant of PAT/afib not on anticoagulation; pacemaker placement; BPH; HTN; HLD; and CAD presenting with weakness.   He was brought in from his assisted living facility.  Evaluation raised concern for left-sided pneumonia and pleural effusion.  He was also found to have acute renal failure.  He was hospitalized for further management.   Reason for Visit: Healthcare associated pneumonia  Consultants: Palliative medicine  Procedures: None  Antibiotics: Vancomycin and cefepime  Subjective/Interval History: Patient complains of having cough.  Denies any chest pain.  No difficulty breathing currently.  ROS: No nausea or vomiting  Objective:  Vital Signs  Vitals:   11/19/17 1700 11/19/17 1804 11/19/17 2300 11/20/17 0844  BP: 102/63 (!) 148/69 130/65 126/70  Pulse: 87 78 70 60  Resp: 18 (!) 21 16   Temp:  97.7 F (36.5 C) (!) 97.5 F (36.4 C) (!) 97.4 F (36.3 C)  TempSrc:  Oral Oral   SpO2: 96% 95% 98% 96%  Weight:  80 kg (176 lb 5.9 oz) 80.2 kg (176 lb 12.9 oz)   Height:        Intake/Output Summary (Last 24 hours) at 11/20/2017 1155 Last data filed at 11/20/2017 0600 Gross per 24 hour  Intake 688.33 ml  Output 700 ml  Net -11.67 ml   Filed Weights   11/19/17 1142 11/19/17 1804 11/19/17 2300  Weight: 77.1 kg (170 lb) 80 kg (176 lb 5.9 oz) 80.2 kg (176 lb 12.9 oz)    General appearance: alert, cooperative, appears stated age and no distress Head: Normocephalic, without obvious abnormality, atraumatic Resp: At rest.  Diminished air entry on the left side.  Few crackles noted.  No wheezing.  No rhonchi. Cardio: regular rate and rhythm, S1, S2 normal, no murmur, click, rub or gallop GI: soft, non-tender; bowel sounds normal; no masses,  no organomegaly Extremities: extremities  normal, atraumatic, no cyanosis or edema Neurologic: Distracted but no obvious focal neurological deficits.  Lab Results:  Data Reviewed: I have personally reviewed following labs and imaging studies  CBC: Recent Labs  Lab 11/19/17 1150 11/20/17 0351  WBC 10.4 12.8*  NEUTROABS 8.9* 10.9*  HGB 9.6* 9.9*  HCT 30.4* 31.3*  MCV 102.7* 102.6*  PLT 259 271    Basic Metabolic Panel: Recent Labs  Lab 11/19/17 1150 11/20/17 0351  NA 142 146*  K 4.5 4.2  CL 112* 115*  CO2 21* 17*  GLUCOSE 179* 128*  BUN 79* 71*  CREATININE 2.39* 2.10*  CALCIUM 8.5* 8.3*    GFR: Estimated Creatinine Clearance: 18.8 mL/min (A) (by C-G formula based on SCr of 2.1 mg/dL (H)).  Liver Function Tests: Recent Labs  Lab 11/19/17 1150  AST 56*  ALT 64*  ALKPHOS 135*  BILITOT 1.0  PROT 6.2*  ALBUMIN 2.2*    Recent Labs  Lab 11/19/17 1150  LIPASE 24   Recent Labs  Lab 11/19/17 1255  AMMONIA 14    CBG: Recent Labs  Lab 11/19/17 1842 11/20/17 0053 11/20/17 0842  GLUCAP 142* 118* 128*     Radiology Studies: Ct Abdomen Pelvis Wo Contrast  Result Date: 11/19/2017 CLINICAL DATA:  Abdominal pain EXAM: CT ABDOMEN AND PELVIS WITHOUT CONTRAST TECHNIQUE: Multidetector CT imaging of the abdomen and pelvis was performed following the standard protocol without IV contrast. COMPARISON:  None. FINDINGS: Lower chest: Considerable left-sided pleural effusion  is noted. This may be complicated in nature given its appearance superiorly. Some increased density is noted within the collapsed left lower lobe which may be related to aspirated material. Hepatobiliary: No focal liver abnormality is seen. No gallstones, gallbladder wall thickening, or biliary dilatation. Pancreas: Unremarkable. No pancreatic ductal dilatation or surrounding inflammatory changes. Spleen: Normal in size without focal abnormality. Adrenals/Urinary Tract: Adrenal glands are within normal limits. The kidneys demonstrate no  obstructive change. A nonobstructing left renal stone measuring 2 mm is noted. The ureters are within normal limits. The bladder is partially distended. Stomach/Bowel: Fecal material is noted within the rectum which may be related to a mild degree of impaction. No obstructive changes are seen. No inflammatory changes are identified. The appendix has been surgically removed Vascular/Lymphatic: Aortic atherosclerosis. No enlarged abdominal or pelvic lymph nodes. Reproductive: Prostate is within normal limits. Other: No abdominal wall hernia or abnormality. No abdominopelvic ascites. Musculoskeletal: Degenerative changes of lumbar spine are seen. No acute bony abnormality is noted. IMPRESSION: Large left-sided pleural effusion with findings suggestive of aspiration and possible complicated nature of the effusion. Nonobstructing left renal stone. Fecal material within the rectum which may represent a mild impaction. Electronically Signed   By: Alcide Clever M.D.   On: 11/19/2017 14:41   Ct Head Wo Contrast  Result Date: 11/19/2017 CLINICAL DATA:  Altered mental status EXAM: CT HEAD WITHOUT CONTRAST TECHNIQUE: Contiguous axial images were obtained from the base of the skull through the vertex without intravenous contrast. COMPARISON:  CT head 04/21/2012 FINDINGS: Brain: Moderate atrophy with mild progression. Chronic microvascular ischemic changes in the white matter, with progression. Negative for acute infarct, hemorrhage, mass.  No midline shift. Vascular: Negative for hyperdense vessel Skull: Negative Sinuses/Orbits: Mild mucosal edema paranasal sinuses. Bilateral cataract surgery Other: None IMPRESSION: Atrophy and chronic microvascular ischemia have progressed since 2013. No acute abnormality. Electronically Signed   By: Marlan Palau M.D.   On: 11/19/2017 14:37   Dg Chest Portable 1 View  Result Date: 11/19/2017 CLINICAL DATA:  Acute mental status change. EXAM: PORTABLE CHEST 1 VIEW COMPARISON:  April 23, 2012 FINDINGS: There is significant opacity in the left lower lung with convex border superiorly. An oval density is also seen in the left upper lung. No pneumothorax. The cardiomediastinal silhouette is unchanged. No other acute abnormalities. IMPRESSION: There is significant opacity in the left mid and lower lung. While this could represent an unusual effusion with underlying opacity, an underlying pneumonia or mass are not excluded. The oval low-density finding in the left upper lobe could represent fluid in a fissure or a mass. The patient may benefit from a PA and lateral chest x-ray or chest CT for further evaluation. Electronically Signed   By: Gerome Sam III M.D   On: 11/19/2017 12:53     Medications:  Scheduled: . aspirin EC  81 mg Oral Daily  . bisacodyl  10 mg Rectal Once  . enoxaparin (LOVENOX) injection  30 mg Subcutaneous Q24H  . insulin aspart  0-9 Units Subcutaneous TID WC  . latanoprost  1 drop Both Eyes QHS  . metoprolol tartrate  2.5 mg Intravenous Q12H  . polyvinyl alcohol  1 drop Both Eyes BID   Continuous: . sodium chloride 100 mL/hr at 11/20/17 0843  . ceFEPime (MAXIPIME) IV 1 g (11/20/17 1003)  . [START ON 11/21/2017] vancomycin     WUJ:[id number], hydroxypropyl methylcellulose / hypromellose, ondansetron (ZOFRAN) IV, phenylephrine  Assessment/Plan:    Healthcare associated pneumonia No evidence for  acute respiratory failure.  Continue with current antibiotics for now.  Follow-up on cultures.  Patient does have a left-sided pleural effusion.  However considering his advanced age and possible plan for transition to comfort care, thoracentesis was not pursued.  Does not appear to be in any respiratory distress currently.  Swallow evaluation has been requested due to concern for aspiration.  Procalcitonin 0.65.  Acute kidney injury on chronic kidney disease stage IV Renal failure likely due to a combination of prerenal azotemia as well as obstruction.   Patient did have urinary retention while he was in the emergency department.  Urinary catheter had to be placed.  Creatinine slightly better today compared to yesterday.  Continue IV fluids.  Monitor urine output.  History of BPH with acute urinary retention Catheter was placed by the emergency department staff.  Continue for now.  Flomax.  Constipation Stool noted in the rectum.  We will give Dulcolax suppository.  Laxatives.  Essential hypertension Holding ACE inhibitor due to renal failure.  Monitor blood pressures closely.  Mildly abnormal LFTs Check labs again tomorrow.  Etiology unclear but could be due to infection.  DVT Prophylaxis: Lovenox    Code Status: DNR Family Communication: Discussed with the patient's granddaughter Disposition Plan: Management as outlined above.  PT and OT evaluation.  Swallow evaluation.    LOS: 1 day   Osvaldo ShipperGokul Roosevelt Eimers  [facility] Pager [phone] 11/20/2017, 11:55 AM  If 7PM-7AM, please contact night-coverage at [email or web address], password [email or web address]

## 2017-11-20 NOTE — Care Management Note (Signed)
Case Management Note  Patient Details  Name: Tobe Sosrthur W Velez MRN: [id number] Date of Birth: 19-Jul-1913  Subjective/Objective:  From [facility] presents with HCAP, AMS, Acute Kidney Failure, on iv abx, ivf's, palliative consulted, npo , st consulted. Await pt/ot eval.                  Action/Plan: NCM will follow for transition of care needs.   Expected Discharge Date:                  Expected Discharge Plan:  Assisted Living / Rest Home  In-House Referral:  Clinical Social Work  Discharge planning Services  CM Consult  Post Acute Care Choice:    Choice offered to:     DME Arranged:    DME Agency:     HH Arranged:    HH Agency:     Status of Service:  In process, will continue to follow  If discussed at Long Length of Stay Meetings, dates discussed:    Additional Comments:  Leone Havenaylor, Leila Schuff [place], RN 11/20/2017, 12:24 PM

## 2017-11-20 NOTE — Progress Notes (Signed)
PMT consult received and chart reviewed. Patient awake, alert, and with no s/s of distress. No family at bedside. Spoke with daughter, Marylu LundJanet via telephone. [facility]. Will meet with daughter tomorrow to discuss goals of care.   NO CHARGE  Vennie HomansMegan Julien Berryman, FNP-C Palliative Medicine Team  Phone: [phone] Fax: [phone]

## 2017-11-20 NOTE — Progress Notes (Signed)
CSW confirmed pt is from [facility] facility has SNF and can bring pt back at SNF level of care if needed  CSW will continue to follow and assist with disposition as needed  Burna SisJenna H. Franshesca Chipman, LCSW Clinical Social Worker [phone]

## 2017-11-21 ENCOUNTER — Encounter (HOSPITAL_COMMUNITY): Payer: Self-pay

## 2017-11-21 ENCOUNTER — Inpatient Hospital Stay (HOSPITAL_COMMUNITY): Payer: Medicare Other

## 2017-11-21 DIAGNOSIS — N179 Acute kidney failure, unspecified: Secondary | ICD-10-CM

## 2017-11-21 DIAGNOSIS — L8990 Pressure ulcer of unspecified site, unspecified stage: Secondary | ICD-10-CM

## 2017-11-21 DIAGNOSIS — I10 Essential (primary) hypertension: Secondary | ICD-10-CM

## 2017-11-21 DIAGNOSIS — N401 Enlarged prostate with lower urinary tract symptoms: Secondary | ICD-10-CM

## 2017-11-21 DIAGNOSIS — R3915 Urgency of urination: Secondary | ICD-10-CM

## 2017-11-21 DIAGNOSIS — J189 Pneumonia, unspecified organism: Secondary | ICD-10-CM

## 2017-11-21 LAB — COMPREHENSIVE METABOLIC PANEL
ALBUMIN: 1.9 g/dL — AB (ref 3.5–5.0)
ALK PHOS: 104 U/L (ref 38–126)
ALT: 50 U/L — AB (ref 0–44)
ANION GAP: 11 (ref 5–15)
AST: 54 U/L — ABNORMAL HIGH (ref 15–41)
BUN: 65 mg/dL — ABNORMAL HIGH (ref 8–23)
CHLORIDE: 121 mmol/L — AB (ref 98–111)
CO2: 17 mmol/L — AB (ref 22–32)
CREATININE: 1.9 mg/dL — AB (ref 0.61–1.24)
Calcium: 8.3 mg/dL — ABNORMAL LOW (ref 8.9–10.3)
GFR calc non Af Amer: 27 mL/min — ABNORMAL LOW (ref >60)
GFR, EST AFRICAN AMERICAN: 31 mL/min — AB (ref >60)
GLUCOSE: 138 mg/dL — AB (ref 70–99)
Potassium: 3.7 mmol/L (ref 3.5–5.1)
SODIUM: 149 mmol/L — AB (ref 135–145)
Total Bilirubin: 1.1 mg/dL (ref 0.3–1.2)
Total Protein: 5.9 g/dL — ABNORMAL LOW (ref 6.5–8.1)

## 2017-11-21 LAB — CBC
HCT: 31 % — ABNORMAL LOW (ref 39.0–52.0)
HEMOGLOBIN: 9.7 g/dL — AB (ref 13.0–17.0)
MCH: 31.9 pg (ref 26.0–34.0)
MCHC: 31.3 g/dL (ref 30.0–36.0)
MCV: 102 fL — ABNORMAL HIGH (ref 78.0–100.0)
PLATELETS: 287 10*3/uL (ref 150–400)
RBC: 3.04 MIL/uL — AB (ref 4.22–5.81)
RDW: 14.6 % (ref 11.5–15.5)
WBC: 14.6 10*3/uL — ABNORMAL HIGH (ref 4.0–10.5)

## 2017-11-21 LAB — GLUCOSE, CAPILLARY
GLUCOSE-CAPILLARY: 150 mg/dL — AB (ref 70–99)
GLUCOSE-CAPILLARY: 145 mg/dL — AB (ref 70–99)
GLUCOSE-CAPILLARY: 128 mg/dL — AB (ref 70–99)
Glucose-Capillary: 122 mg/dL — ABNORMAL HIGH (ref 70–99)

## 2017-11-21 MED ORDER — SODIUM CHLORIDE 0.9 % IV SOLN
3.0000 g | Freq: Two times a day (BID) | INTRAVENOUS | Status: DC
  Administered 2017-11-21 – 2017-11-23 (×4): 3 g via INTRAVENOUS
  Filled 2017-11-21 (×5): qty 3

## 2017-11-21 MED ORDER — GERHARDT'S BUTT CREAM
TOPICAL_CREAM | Freq: Four times a day (QID) | CUTANEOUS | Status: DC
  Administered 2017-11-21 – 2017-11-22 (×4): via TOPICAL
  Administered 2017-11-22 (×2): 1 via TOPICAL
  Administered 2017-11-23: 13:00:00 via TOPICAL
  Filled 2017-11-21: qty 1

## 2017-11-21 NOTE — Progress Notes (Signed)
OT Note - Addendum    11/21/17 1400  OT Visit Information  Last OT Received On 11/21/17  OT Time Calculation  OT Start Time (ACUTE ONLY) 1110  OT Stop Time (ACUTE ONLY) 1147  OT Time Calculation (min) 37 min  OT General Charges  $OT Visit 1 Visit  OT Evaluation  $OT Eval Moderate Complexity 1 Mod  Luisa DagoHilary Nicoya Friel, OT/L  OT Clinical Specialist [phone]

## 2017-11-21 NOTE — Evaluation (Signed)
Physical Therapy Evaluation Patient Details Name: Caleb Nash MRN: [id number] DOB: 15-Nov-1913 Today's Date: 11/21/2017   History of Present Illness  84 y.o. male with medical history significant for PAT/afib not on anticoagulation; pacemaker placement; BPH; HTN; HLD; and CAD presenting with weakness.   He was brought in from his assisted living facility.  Evaluation raised concern for left-sided pneumonia and pleural effusion.  Dx acute renal failure, HCAP.   Clinical Impression  Two weeks prior to admission pt able to ambulate with RW to/from dining hall in ALF and able to perform bathing and dressing with supervision. Pt has experiences and decline in function in the last two weeks and was in the process of transitioning to Health Care level care at [facility]. Pt currently limited in safe mobility by increased effort for breathing, positional oxygen desaturation (see General Comments) decreased cognition and decreased strength. Pt requires max A for rolling and maxAx2 for sitting EOB and coming to standing with RW. Pt fatigues quickly with all mobility. PT recommends SNF level rehab at d/c. PT will continue to follow acutely.       Follow Up Recommendations SNF    Equipment Recommendations  None recommended by PT    Recommendations for Other Services       Precautions / Restrictions Precautions Precautions: Fall Restrictions Weight Bearing Restrictions: No      Mobility  Bed Mobility Overal bed mobility: Needs Assistance Bed Mobility: Supine to Sit;Sit to Supine;Rolling Rolling: Max assist   Supine to sit: Max assist;+2 for physical assistance Sit to supine: Max assist;+2 for physical assistance   General bed mobility comments: requires maxA for rolling for pericare, and maxAx2 for management of trunk and LE with coming to EoB and back to supine  Transfers Overall transfer level: Needs assistance Equipment used: Rolling walker (2 wheeled) Transfers: Sit  to/from Stand Sit to Stand: Max assist;+2 physical assistance         General transfer comment: maxAx2 for coming to standing, unable to follow commands for hand placement for powerup and unable to attain fully upright with standing x2    Ambulation/Gait             General Gait Details: unable to attempt       Balance Overall balance assessment: Needs assistance Sitting-balance support: Feet supported;Bilateral upper extremity supported Sitting balance-Leahy Scale: Poor Sitting balance - Comments: requires assist to maintain balance    Standing balance support: Bilateral upper extremity supported Standing balance-Leahy Scale: Zero                               Pertinent Vitals/Pain Pain Assessment: No/denies pain    Home Living Family/patient expects to be discharged to:: Skilled nursing facility                 Additional Comments: pt in process of transferring from ALF to SNF     Prior Function Level of Independence: Needs assistance   Gait / Transfers Assistance Needed: able to ambulate with RW to dining room for breakfast, however has not been able to for last 2 weeks   ADL's / Homemaking Assistance Needed: able to bathe and dress with supervision, however decline in last 2 weeks           Extremity/Trunk Assessment   Upper Extremity Assessment Upper Extremity Assessment: Defer to OT evaluation    Lower Extremity Assessment Lower Extremity Assessment: Difficult to assess  due to impaired cognition;RLE deficits/detail;LLE deficits/detail RLE Deficits / Details: decreased PROM, difficult to assess if actual stiffness of joint or resistance to movement  RLE Coordination: decreased fine motor;decreased gross motor LLE Deficits / Details: L LE stiffness greater than R  LLE Coordination: decreased fine motor;decreased gross motor    Cervical / Trunk Assessment Cervical / Trunk Assessment: Kyphotic  Communication   Communication: HOH   Cognition Arousal/Alertness: Lethargic Behavior During Therapy: Flat affect Overall Cognitive Status: Impaired/Different from baseline Area of Impairment: Following commands;Problem solving                       Following Commands: Follows one step commands with increased time;Follows one step commands inconsistently;Follows multi-step commands inconsistently     Problem Solving: Requires verbal cues;Requires tactile cues;Difficulty sequencing;Decreased initiation;Slow processing General Comments: pt with difficulty following commands in a timely manner, for example difficulty responding to command for lifting LE, and minutes later pt lifts leg without prompt      General Comments General comments (skin integrity, edema, etc.): pt laid flat for pericare and wound assessment, pt exhibited increased accessory breathing and SaO2 on RA dropped to 83%O2, with sitting upright and vc for pursed lipped breathing SaO2 rebounded to 90%O2 on RA, pt with maximal effort for pursed lipped breathing, vc for returning to normal breathing pattern and pt unable to follow command. Despite increased effort for breathing SaO2 >89%O2 with bed mobility and transfers.         Assessment/Plan    PT Assessment Patient needs continued PT services  PT Problem List Decreased strength;Decreased range of motion;Decreased activity tolerance;Decreased balance;Decreased mobility;Decreased coordination;Decreased safety awareness;Cardiopulmonary status limiting activity       PT Treatment Interventions DME instruction;Gait training;Functional mobility training;Therapeutic activities;Therapeutic exercise;Balance training;Cognitive remediation;Patient/family education    PT Goals (Current goals can be found in the Care Plan section)  Acute Rehab PT Goals Patient Stated Goal: go to SNF  PT Goal Formulation: With patient/family Time For Goal Achievement: 12/05/17 Potential to Achieve Goals: Fair    Frequency  Min 2X/week        Co-evaluation PT/OT/SLP Co-Evaluation/Treatment: Yes Reason for Co-Treatment: Necessary to address cognition/behavior during functional activity;For patient/therapist safety PT goals addressed during session: Mobility/safety with mobility         AM-PAC PT "6 Clicks" Daily Activity  Outcome Measure Difficulty turning over in bed (including adjusting bedclothes, sheets and blankets)?: Unable Difficulty moving from lying on back to sitting on the side of the bed? : Unable Difficulty sitting down on and standing up from a chair with arms (e.g., wheelchair, bedside commode, etc,.)?: Unable Help needed moving to and from a bed to chair (including a wheelchair)?: Total Help needed walking in hospital room?: Total Help needed climbing 3-5 steps with a railing? : Total 6 Click Score: 6    End of Session Equipment Utilized During Treatment: Gait belt Activity Tolerance: Patient limited by fatigue Patient left: in bed;with call bell/phone within reach;with bed alarm set;with family/visitor present Nurse Communication: Mobility status;Other (comment)(increased effort in breathing) PT Visit Diagnosis: Unsteadiness on feet (R26.81);Other abnormalities of gait and mobility (R26.89);Muscle weakness (generalized) (M62.81);Difficulty in walking, not elsewhere classified (R26.2)    Time: 1110-1147 PT Time Calculation (min) (ACUTE ONLY): 37 min   Charges:   PT Evaluation $PT Eval Moderate Complexity: 1 Mod          Salil Raineri B. Beverely RisenVan Fleet PT, DPT Acute Rehabilitation  [phone] Pager [phone]    Lanora ManisElizabeth  Rhona Leavens Fleet 11/21/2017, 12:58 PM

## 2017-11-21 NOTE — Consult Note (Signed)
WOC Nurse wound consult note [facility] Reason for Consult: Sacral wound Wound type: Highly erythematous, friable tissue related to fecal incontinence and fungal rash, satellite lesions. Plan of care:  Gerhardt's butt cream QID, air mattress, Dermatherapy linens, no incontinent briefs.  All orders are on record. Monitor the wound area(s) for worsening of condition such as: Signs/symptoms of infection,  Increase in size,  Development of or worsening of odor, Development of pain, or increased pain at the affected locations.  Notify the medical team if any of these develop.  Thank you for the consult.  Discussed plan of care with the patient and bedside nurse.  WOC nurse will not follow at this time.  Please re-consult the WOC team if needed.  Helmut MusterSherry Weylyn Ricciuti, RN, MSN, CWOCN, CNS-BC, pager [phone]

## 2017-11-21 NOTE — Progress Notes (Signed)
Pharmacy Antibiotic Note  Caleb Nash is a [age] y.o. male admitted on 11/19/2017 with pneumonia.  Pharmacy has been consulted to narrow antibiotics to Unasyn.  Renal function is improving.  Afebrile, WBC up to 14.6.   Plan: Unasyn 3gm IV Q12H Monitor renal fxn, clinical progress   Height: 5\' 10"  (177.8 cm) Weight: 176 lb 12.9 oz (80.2 kg) IBW/kg (Calculated) : 73  Temp (24hrs), Avg:98.4 F (36.9 C), Min:98.2 F (36.8 C), Max:98.5 F (36.9 C)  Recent Labs  Lab 11/19/17 1150 11/19/17 1300 11/20/17 0351 11/21/17 0243  WBC 10.4  --  12.8* 14.6*  CREATININE 2.39*  --  2.10* 1.90*  LATICACIDVEN  --  1.70  --   --     Estimated Creatinine Clearance: 20.8 mL/min (A) (by C-G formula based on SCr of 1.9 mg/dL (H)).    Allergies  Allergen Reactions  . Aleve [Naproxen Sodium]   . Celebrex [Celecoxib]     Lips swelled  . Sulfur     Lips swelled  . Vioxx [Rofecoxib] Other (See Comments)    unknown     7/29 azith x 1 7/29 rocephinx 1 7/29 vanc >> 7/31 7/30 cefepime >> 7/31 7/31 Unasyn >>  7/29 urine strep>>neg 7/29 urine>> 7/29 blood x1>> NGTD   Tokiko Diefenderfer D. Laney Potashang, PharmD, BCPS, BCCCP 11/21/2017, 3:07 PM

## 2017-11-21 NOTE — Progress Notes (Addendum)
PROGRESS NOTE    [facility]  ION:[id number] DOB: 1913-05-17 DOA: 11/19/2017 PCP: Oneal GroutPandey, Mahima, MD    Brief Narrative:  84 year old male who presented with weakness.  He does have significant past medical history for paroxysmal atrial fibrillation, hypertension, dyslipidemia, coronary disease and status post pacemaker.  Patient was noted to have progressive weakness over the last 2 weeks prior to hospitalization, associated with decreased appetite and decreased oral intake.  Recently treated for pneumonia.  On the day of admission he was noted to be less responsive and tachycardic then transported to the emergency room.  On the initial physical examination blood pressure 127/60, heart rate 57, respiratory rate 22, oxygen saturation 81%.  He was obtunded, confused and disoriented, his lungs had no wheezing, rales or rhonchi, heart S1-S2 present and rhythmic, abdomen soft nontender, positive lower extremity edema.  Sodium 142, potassium 4.5, chloride 112, bicarb 21, glucose 179, BUN 79, creatinine 2.39, white count 10.4, hemoglobin 8.6 hematocrit 30.4, platelets 259.  Urinalysis specific gravity 1.015.  Head CT with atrophy, no acute changes.  Abdominal CT with large sided pleural effusion, nonobstructing left renal stone, fecal material within the rectum which may represent mild impaction.  Chest x-ray with left upper lobe infiltrate.  Sinus rhythm, precordial Q waves.  Positive artifact.  Patient was admitted to the hospital working diagnosis of healthcare associated pneumonia.   Assessment & Plan:   Principal Problem:   HCAP (healthcare-associated pneumonia) Active Problems:   Benign prostatic hyperplasia (BPH) with urinary urgency   Constipation   Essential hypertension   Pleural effusion on left   Acute renal failure superimposed on stage 4 chronic kidney disease (HCC)   Goals of care, counseling/discussion   Pressure injury of skin   1. Pneumonia (present on admission). Patient  failed swallow evaluation, is NPO for now, will wait for patient's family meeting. Will continue aspiration precautions, chest film personally reviewed noted right upper lobe infiltrate. Patient has remained afebrile, cultures no growth, will de-escalate antibiotic therapy to Unasyn, and will discontinue vancomycin and cefepime. Continue to follow cell count and temperature curve. Decrease IV fluids to 50 ml per hour.   2. Metabolic encephalopthy. Will continue neuro checks and aspiration precautions. Family meeting scheduled for today. Physical therapy evaluation.   3. T2DM. Will continue glucose cover and monitoring with insulin sliding scale, capillary glucose 127, 118, 117, 122, 150.   4. Pre and post renal AKI on CKD stage IV. Renal function with serum cr at 1,90, with K at 3,7 and serum bicarbonate at 17. Will continue hydration with saline at 50 ml per hour and will follow on renal panel in am. Foley catheter in place.   5. HTN. Continue metoprolol for blood pressure control.   DVT prophylaxis: enoxparin   Code Status: DNR Family Communication: no family at the bedside   Disposition Plan/ discharge barriers: pending clinical improvement,    Consultants:     Procedures:     Antimicrobials:   Unasyn.     Subjective: Patient is awake and alert, responds to simple questions, has been npo due to swallow dysfunction, most information from nursing at the bedside, patient not able to give detailed history due to confusion.   Objective: Vitals:   11/20/17 0844 11/20/17 1657 11/20/17 2306 11/21/17 0749  BP: 126/70 (!) 173/61 (!) 144/63 (!) 170/65  Pulse: 60 60 (!) 56 79  Resp:   14 19  Temp: (!) 97.4 F (36.3 C) 98.5 F (36.9 C) 98.2 F (36.8 C)  98.5 F (36.9 C)  TempSrc:   Oral Oral  SpO2: 96% 97% 96% 96%  Weight:      Height:        Intake/Output Summary (Last 24 hours) at 11/21/2017 0854 Last data filed at 11/21/2017 0737 Gross per 24 hour  Intake 2803.48 ml  Output  850 ml  Net 1953.48 ml   Filed Weights   11/19/17 1142 11/19/17 1804 11/19/17 2300  Weight: 77.1 kg (170 lb) 80 kg (176 lb 5.9 oz) 80.2 kg (176 lb 12.9 oz)    Examination:   General: deconditioned  Neurology: Awake and alert, non focal  E ENT: mild pallor, no icterus, oral mucosa moist Cardiovascular: No JVD. S1-S2 present, rhythmic, no gallops, rubs, or murmurs. No lower extremity edema. Pulmonary: decreased breath sounds bilaterally, adequate air movement, no wheezing, rhonchi or rales. Gastrointestinal. Abdomen with no organomegaly, non tender, no rebound or guarding Skin. No rashes Musculoskeletal: no joint deformities     Data Reviewed: I have personally reviewed following labs and imaging studies  CBC: Recent Labs  Lab 11/19/17 1150 11/20/17 0351 11/21/17 0243  WBC 10.4 12.8* 14.6*  NEUTROABS 8.9* 10.9*  --   HGB 9.6* 9.9* 9.7*  HCT 30.4* 31.3* 31.0*  MCV 102.7* 102.6* 102.0*  PLT 259 271 287   Basic Metabolic Panel: Recent Labs  Lab 11/19/17 1150 11/20/17 0351 11/21/17 0243  NA 142 146* 149*  K 4.5 4.2 3.7  CL 112* 115* 121*  CO2 21* 17* 17*  GLUCOSE 179* 128* 138*  BUN 79* 71* 65*  CREATININE 2.39* 2.10* 1.90*  CALCIUM 8.5* 8.3* 8.3*   GFR: Estimated Creatinine Clearance: 20.8 mL/min (A) (by C-G formula based on SCr of 1.9 mg/dL (H)). Liver Function Tests: Recent Labs  Lab 11/19/17 1150 11/21/17 0243  AST 56* 54*  ALT 64* 50*  ALKPHOS 135* 104  BILITOT 1.0 1.1  PROT 6.2* 5.9*  ALBUMIN 2.2* 1.9*   Recent Labs  Lab 11/19/17 1150  LIPASE 24   Recent Labs  Lab 11/19/17 1255  AMMONIA 14   Coagulation Profile: No results for input(s): INR, PROTIME in the last 168 hours. Cardiac Enzymes: No results for input(s): CKTOTAL, CKMB, CKMBINDEX, TROPONINI in the last 168 hours. BNP (last 3 results) No results for input(s): PROBNP in the last 8760 hours. HbA1C: No results for input(s): HGBA1C in the last 72 hours. CBG: Recent Labs  Lab  11/20/17 1238 11/20/17 1443 11/20/17 1656 11/20/17 2215 11/21/17 0722  GLUCAP 130* 127* 118* 117* 122*   Lipid Profile: No results for input(s): CHOL, HDL, LDLCALC, TRIG, CHOLHDL, LDLDIRECT in the last 72 hours. Thyroid Function Tests: No results for input(s): TSH, T4TOTAL, FREET4, T3FREE, THYROIDAB in the last 72 hours. Anemia Panel: No results for input(s): VITAMINB12, FOLATE, FERRITIN, TIBC, IRON, RETICCTPCT in the last 72 hours.    Radiology Studies: I have reviewed all of the imaging during this hospital visit personally     Scheduled Meds: . aspirin EC  81 mg Oral Daily  . enoxaparin (LOVENOX) injection  30 mg Subcutaneous Q24H  . insulin aspart  0-9 Units Subcutaneous TID WC  . latanoprost  1 drop Both Eyes QHS  . mouth rinse  15 mL Mouth Rinse BID  . metoprolol tartrate  2.5 mg Intravenous Q12H  . polyvinyl alcohol  1 drop Both Eyes BID  . tamsulosin  0.4 mg Oral QPC supper   Continuous Infusions: . sodium chloride 100 mL/hr at 11/21/17 0456  . ceFEPime (MAXIPIME) IV 1  g (11/21/17 0830)  . vancomycin       LOS: 2 days        Coralie Keens, MD [facility] Pager [phone]

## 2017-11-21 NOTE — Progress Notes (Signed)
Occupational Therapy Evaluation Patient Details Name: Caleb Nash MRN: [id number] DOB: November 30, 1913 Today's Date: 11/21/2017    History of Present Illness [age] y.o. male with medical history significant for PAT/afib not on anticoagulation; pacemaker placement; BPH; HTN; HLD; and CAD presenting with weakness.   He was brought in from his assisted living facility.  Evaluation raised concern for left-sided pneumonia and pleural effusion.  Dx acute renal failure, HCAP.    Clinical Impression   PTA, pt was living at ALF and required S for safety with ADLs and was mod independent with functional mobility at RW level. Two weeks prior to admission, pt's family reports pt's level of function has been declining and is in the process of transitioning to SNF. Pt currently requires maxA for rolling and maxA+2 for functional mobility. Pt had increased wob while supine SpO2 83% RA, SpO2 returned to 90% RA with pursed lip breathing while seated upright. Pt maintained SpO2 >89% with bed mobility and transfers. Due to decline in function, pt would benefit from acute OT to address establish goals to maximize pt's independence and safety with ADLs. At this time, recommend SNF follow-up.      Follow Up Recommendations  SNF;Supervision/Assistance - 24 hour    Equipment Recommendations  3 in 1 bedside commode    Recommendations for Other Services Speech consult;Other (comment)(Palliative)     Precautions / Restrictions Precautions Precautions: Fall Restrictions Weight Bearing Restrictions: No      Mobility Bed Mobility Overal bed mobility: Needs Assistance Bed Mobility: Supine to Sit;Sit to Supine;Rolling Rolling: Max assist   Supine to sit: Max assist;+2 for physical assistance Sit to supine: Max assist;+2 for physical assistance   General bed mobility comments: requires maxA for rolling for pericare, and maxAx2 for management of trunk and LE with coming to EoB and back to  supine  Transfers Overall transfer level: Needs assistance Equipment used: Rolling walker (2 wheeled) Transfers: Sit to/from Stand Sit to Stand: Max assist;+2 physical assistance         General transfer comment: maxAx2 for coming to standing, unable to follow commands for hand placement for powerup and unable to attain fully upright with standing x2      Balance Overall balance assessment: Needs assistance Sitting-balance support: Feet supported;Bilateral upper extremity supported Sitting balance-Leahy Scale: Poor Sitting balance - Comments: requires assist to maintain balance    Standing balance support: Bilateral upper extremity supported Standing balance-Leahy Scale: Zero                             ADL either performed or assessed with clinical judgement   ADL Overall ADL's : Needs assistance/impaired Eating/Feeding: Moderate assistance;Sitting(with supported backrest)   Grooming: Moderate assistance;Sitting(with support backrest)   Upper Body Bathing: Maximal assistance;Sitting(with supported back rest)   Lower Body Bathing: +2 for physical assistance;Sit to/from stand;Maximal assistance   Upper Body Dressing : Maximal assistance;Sitting   Lower Body Dressing: Maximal assistance;+2 for physical assistance;Sit to/from stand   Toilet Transfer: Maximal assistance;+2 for physical assistance;Squat-pivot   Toileting- Clothing Manipulation and Hygiene: Maximal assistance;+2 for physical assistance;Sitting/lateral lean       Functional mobility during ADLs: Maximal assistance;+2 for physical assistance General ADL Comments: pt requires maxA for rolling and maxA +2 for sit>stand      Vision         Perception     Praxis      Pertinent Vitals/Pain Pain Assessment: No/denies pain  Hand Dominance Right   Extremity/Trunk Assessment Upper Extremity Assessment Upper Extremity Assessment: RUE deficits/detail;Generalized weakness RUE Deficits /  Details: Shoulder Flexion AROM 95degrees,PROM 110degrees;pt able to maintain grasp on suction and able to bring it to mouth RUE Coordination: decreased gross motor;decreased fine motor     Cervical / Trunk Assessment Cervical / Trunk Assessment: Kyphotic   Communication Communication Communication: HOH   Cognition Arousal/Alertness: Lethargic Behavior During Therapy: Flat affect Overall Cognitive Status: Impaired/Different from baseline Area of Impairment: Following commands;Problem solving                       Following Commands: Follows one step commands with increased time;Follows one step commands inconsistently;Follows multi-step commands inconsistently     Problem Solving: Requires verbal cues;Requires tactile cues;Difficulty sequencing;Decreased initiation;Slow processing General Comments: pt with difficulty following commands in a timely manner, for example difficulty responding to command for lifting LE, and minutes later pt lifts leg without prompt   General Comments  Pt participated in pericare and wound assessment with maxA for rolling;lying supine pt had increased Wob and accessory breathing, SpO2 83% RA;HOB elevated to upright position and pt educated on pursed lip breathing SpO2 90% RA;pt returned to accessory breathing requiring maxVC to return to normal breathing;maintained SpO2 >89% during bed mobiltiy and sit<>stand    Exercises     Shoulder Instructions      Home Living Family/patient expects to be discharged to:: Skilled nursing facility                                 Additional Comments: pt in process of transferring from ALF to SNF       Prior Functioning/Environment Level of Independence: Needs assistance  Gait / Transfers Assistance Needed: able to ambulate with RW to dining room for breakfast, however has not been able to for last 2 weeks  ADL's / Homemaking Assistance Needed: able to bathe and dress with supervision, however  decline in last 2 weeks            OT Problem List: Decreased strength;Decreased range of motion;Decreased activity tolerance;Impaired balance (sitting and/or standing);Decreased coordination;Decreased cognition;Decreased safety awareness;Decreased knowledge of use of DME or AE;Cardiopulmonary status limiting activity      OT Treatment/Interventions: Self-care/ADL training;Therapeutic exercise;Neuromuscular education;Energy conservation;DME and/or AE instruction;Therapeutic activities;Cognitive remediation/compensation;Patient/family education;Balance training    OT Goals(Current goals can be found in the care plan section) Acute Rehab OT Goals Patient Stated Goal: go to SNF  OT Goal Formulation: With patient/family Time For Goal Achievement: 12/05/17 Potential to Achieve Goals: Good  OT Frequency: Min 2X/week   Barriers to D/C: Decreased caregiver support  pt requires maxA +2 for transfers        Co-evaluation   Reason for Co-Treatment: Necessary to address cognition/behavior during functional activity;For patient/therapist safety PT goals addressed during session: Mobility/safety with mobility        AM-PAC PT "6 Clicks" Daily Activity     Outcome Measure Help from another person eating meals?: A Lot Help from another person taking care of personal grooming?: A Lot Help from another person toileting, which includes using toliet, bedpan, or urinal?: A Lot Help from another person bathing (including washing, rinsing, drying)?: A Lot Help from another person to put on and taking off regular upper body clothing?: A Lot Help from another person to put on and taking off regular lower body clothing?: A Lot 6 Click Score:  12   End of Session Equipment Utilized During Treatment: Gait belt Nurse Communication: Mobility status  Activity Tolerance: Patient tolerated treatment well Patient left: in bed;with call bell/phone within reach;with family/visitor present  OT Visit  Diagnosis: Unsteadiness on feet (R26.81);Other abnormalities of gait and mobility (R26.89);Muscle weakness (generalized) (M62.81);Other symptoms and signs involving cognitive function                Time: 1110-1147 OT Time Calculation (min): 37 min Charges:     Diona Browner OTS    Diona Browner 11/21/2017, 2:11 PM

## 2017-11-21 NOTE — Progress Notes (Signed)
Modified Barium Swallow Progress Note  Patient Details  Name: Tobe Sosrthur W Paulette MRN: [id number] Date of Birth: [birth year]  Today's Date: 11/21/2017  Modified Barium Swallow completed.  Full report located under Chart Review in the Imaging Section.  Brief recommendations include the following:  Clinical Impression  Pt presents with a significant dysphagia marked by gross aspiration of thin liquids, moderate aspiration of honey-thick liquids.  There was generalized sluggishness of all the necessary components of the swallow. There was reduced oral control/bolus cohesion, leading to spillage of liquids into the larynx and airway before the swallow response was triggered.  As the study progressed, pt's ability to mobilize the larynx/epiglottis improved, but airway protection was ineffective. Pharyngeal contraction was limited, leading to moderate-severe residue in the pharynx.  Pt was eventually able to expectorate a portion of the residue; oral suctioning provided to remove remaining residue.  All consistencies, liquid or solid, pose a high risk for aspiration at this time.  Recommend continued NPO, excluding ice chips, pending family decision about how to proceed.  Attempted to call granddaughter, Noreene LarssonJill, as requested, and left voice mail message.     Swallow Evaluation Recommendations       SLP Diet Recommendations: NPO;Other (Comment)(ice chips)                       Oral Care Recommendations: Oral care QID        Blenda MountsCouture, Levii Hairfield Laurice 11/21/2017,2:36 PM

## 2017-11-22 DIAGNOSIS — J181 Lobar pneumonia, unspecified organism: Secondary | ICD-10-CM

## 2017-11-22 DIAGNOSIS — K5900 Constipation, unspecified: Secondary | ICD-10-CM

## 2017-11-22 LAB — BASIC METABOLIC PANEL
Anion gap: 9 (ref 5–15)
BUN: 57 mg/dL — ABNORMAL HIGH (ref 8–23)
CHLORIDE: 126 mmol/L — AB (ref 98–111)
CO2: 19 mmol/L — AB (ref 22–32)
Calcium: 8.3 mg/dL — ABNORMAL LOW (ref 8.9–10.3)
Creatinine, Ser: 1.65 mg/dL — ABNORMAL HIGH (ref 0.61–1.24)
GFR calc non Af Amer: 32 mL/min — ABNORMAL LOW (ref >60)
GFR, EST AFRICAN AMERICAN: 37 mL/min — AB (ref >60)
Glucose, Bld: 139 mg/dL — ABNORMAL HIGH (ref 70–99)
POTASSIUM: 3.4 mmol/L — AB (ref 3.5–5.1)
Sodium: 154 mmol/L — ABNORMAL HIGH (ref 135–145)

## 2017-11-22 LAB — CBC WITH DIFFERENTIAL/PLATELET
ABS IMMATURE GRANULOCYTES: 0.4 10*3/uL — AB (ref 0.0–0.1)
Basophils Absolute: 0.1 10*3/uL (ref 0.0–0.1)
Basophils Relative: 0 %
EOS ABS: 0 10*3/uL (ref 0.0–0.7)
Eosinophils Relative: 0 %
HEMATOCRIT: 32 % — AB (ref 39.0–52.0)
Hemoglobin: 9.9 g/dL — ABNORMAL LOW (ref 13.0–17.0)
IMMATURE GRANULOCYTES: 2 %
LYMPHS ABS: 1.1 10*3/uL (ref 0.7–4.0)
Lymphocytes Relative: 7 %
MCH: 31.9 pg (ref 26.0–34.0)
MCHC: 30.9 g/dL (ref 30.0–36.0)
MCV: 103.2 fL — AB (ref 78.0–100.0)
MONOS PCT: 6 %
Monocytes Absolute: 0.9 10*3/uL (ref 0.1–1.0)
NEUTROS PCT: 85 %
Neutro Abs: 13.5 10*3/uL — ABNORMAL HIGH (ref 1.7–7.7)
PLATELETS: 285 10*3/uL (ref 150–400)
RBC: 3.1 MIL/uL — AB (ref 4.22–5.81)
RDW: 14.7 % (ref 11.5–15.5)
WBC: 16 10*3/uL — AB (ref 4.0–10.5)

## 2017-11-22 LAB — GLUCOSE, CAPILLARY
GLUCOSE-CAPILLARY: 118 mg/dL — AB (ref 70–99)
GLUCOSE-CAPILLARY: 135 mg/dL — AB (ref 70–99)
GLUCOSE-CAPILLARY: 119 mg/dL — AB (ref 70–99)

## 2017-11-22 NOTE — Care Management Important Message (Signed)
Important Message  Patient Details  Name: Tobe Sosrthur W Pardon MRN: [id number] Date of Birth: 06-May-1913   Medicare Important Message Given:  No Due to illness patient did not sign unsigned copy left.   Xena Propst 11/22/2017, 1:18 PM

## 2017-11-22 NOTE — Progress Notes (Signed)
Daughter and granddaughter were unable to stay at hospital to meet with me yesterday. [facility] because [facility], [facility]. Will attempt to meet with Marylu LundJanet tomorrow afternoon, 8/2 when she arrives at the hospital. Thank you.   NO CHARGE  Vennie HomansMegan Corrina Steffensen, FNP-C Palliative Medicine Team  Phone: [phone] Fax: [phone]

## 2017-11-22 NOTE — Progress Notes (Signed)
Patient stating that he is done and ready to die. He asked to speak to daughter Marylu LundJanet. Marylu LundJanet stated that she wanted to make him comfort care and let him eat even though he may aspirate. She doesn't wish to escalate care. Her main goal is to keep him comfortable. She states he told her yesterday that he was ready to go. Currently stable will continue to monitor.

## 2017-11-22 NOTE — Progress Notes (Signed)
PROGRESS NOTE    Caleb Nash  [id number] DOB: 1914/04/21 DOA: 11/19/2017 PCP: Oneal GroutPandey, Mahima, MD    Brief Narrative:  84 year old male who presented with weakness.  He does have significant past medical history for paroxysmal atrial fibrillation, hypertension, dyslipidemia, coronary disease and status post pacemaker.  Patient was noted to have progressive weakness over the last 2 weeks prior to hospitalization, associated with decreased appetite and decreased oral intake.  Recently treated for pneumonia.  On the day of admission he was noted to be less responsive and tachycardic then transported to the emergency room.  On the initial physical examination blood pressure 127/60, heart rate 57, respiratory rate 22, oxygen saturation 81%.  He was obtunded, confused and disoriented, his lungs had no wheezing, rales or rhonchi, heart S1-S2 present and rhythmic, abdomen soft nontender, positive lower extremity edema.  Sodium 142, potassium 4.5, chloride 112, bicarb 21, glucose 179, BUN 79, creatinine 2.39, white count 10.4, hemoglobin 8.6 hematocrit 30.4, platelets 259.  Urinalysis specific gravity 1.015.  Head CT with atrophy, no acute changes.  Abdominal CT with large sided pleural effusion, nonobstructing left renal stone, fecal material within the rectum which may represent mild impaction.  Chest x-ray with left upper lobe infiltrate.  Sinus rhythm, precordial Q waves.  Positive artifact.  Patient was admitted to the hospital working diagnosis of healthcare associated pneumonia.     Assessment & Plan:   Principal Problem:   HCAP (healthcare-associated pneumonia) Active Problems:   Benign prostatic hyperplasia (BPH) with urinary urgency   Constipation   Essential hypertension   Pleural effusion on left   Acute renal failure superimposed on stage 4 chronic kidney disease (HCC)   Goals of care, counseling/discussion   Pressure injury of skin  1. Pneumonia (present on admission).  Continue to be NPO for now, aspiration precautions. Continue antibiotic therapy with IV Unasyn. Continue hydration with saline at 50 ml per hour. Patient oxygenating well    2. Metabolic encephalopthy. More somnolent today, will continue supportive medical therapy, IV fluids, IV antibiotics and aspiration precautions.   3. T2DM.  Glucose cover and monitoring with insulin sliding scale, capillary glucose 150, 145, 128, 118, 135. Continue to be NPO.   4. Pre and post renal AKI on CKD stage IV. Renal function with serum cr down to 1,65 with K at 3,4 and serum bicarbonate at 19, will continue hydration with isotonic saline, follow on renal panel in am.   5. HTN. On metoprolol for blood pressure control. Systolic blood pressure 137 to 147 mmHg.   DVT prophylaxis: enoxparin   Code Status: DNR Family Communication: no family at the bedside   Disposition Plan/ discharge barriers: pending clinical improvement,    Consultants:     Procedures:     Antimicrobials:   Unasyn.   Subjective: Patient today is poorly responsive, no apparent pain or dyspnea, has been kept NPO for aspiration precautions.   Objective: Vitals:   11/21/17 1718 11/21/17 2142 11/21/17 2309 11/22/17 0755  BP: (!) 159/56 (!) 157/42 (!) 137/50 (!) 147/56  Pulse: 63 62 60 60  Resp: (!) 21  (!) 22 18  Temp: 98.4 F (36.9 C)  98.3 F (36.8 C) 98.5 F (36.9 C)  TempSrc: Oral  Oral Oral  SpO2: 94% 95% 97% 97%  Weight:      Height:        Intake/Output Summary (Last 24 hours) at 11/22/2017 1506 Last data filed at 11/22/2017 1000 Gross per 24 hour  Intake 2208.33 ml  Output 1252 ml  Net 956.33 ml   Filed Weights   11/19/17 1142 11/19/17 1804 11/19/17 2300  Weight: 77.1 kg (170 lb) 80 kg (176 lb 5.9 oz) 80.2 kg (176 lb 12.9 oz)    Examination:   General: Not in pain or dyspnea, deconditioned  Neurology: Awake and alert, non focal  E ENT: mild pallor, no icterus, oral mucosa moist Cardiovascular: No  JVD. S1-S2 present, rhythmic, no gallops, rubs, or murmurs. No lower extremity edema. Pulmonary: decreased breath sounds bilaterally, poor inspiratory effort, no wheezing, rhonchi or rales. Gastrointestinal. Abdomen with no organomegaly, non tender, no rebound or guarding Skin. No rashes Musculoskeletal: no joint deformities     Data Reviewed: I have personally reviewed following labs and imaging studies  CBC: Recent Labs  Lab 11/19/17 1150 11/20/17 0351 11/21/17 0243 11/22/17 0243  WBC 10.4 12.8* 14.6* 16.0*  NEUTROABS 8.9* 10.9*  --  13.5*  HGB 9.6* 9.9* 9.7* 9.9*  HCT 30.4* 31.3* 31.0* 32.0*  MCV 102.7* 102.6* 102.0* 103.2*  PLT 259 271 287 285   Basic Metabolic Panel: Recent Labs  Lab 11/19/17 1150 11/20/17 0351 11/21/17 0243 11/22/17 0243  NA 142 146* 149* 154*  K 4.5 4.2 3.7 3.4*  CL 112* 115* 121* 126*  CO2 21* 17* 17* 19*  GLUCOSE 179* 128* 138* 139*  BUN 79* 71* 65* 57*  CREATININE 2.39* 2.10* 1.90* 1.65*  CALCIUM 8.5* 8.3* 8.3* 8.3*   GFR: Estimated Creatinine Clearance: 24 mL/min (A) (by C-G formula based on SCr of 1.65 mg/dL (H)). Liver Function Tests: Recent Labs  Lab 11/19/17 1150 11/21/17 0243  AST 56* 54*  ALT 64* 50*  ALKPHOS 135* 104  BILITOT 1.0 1.1  PROT 6.2* 5.9*  ALBUMIN 2.2* 1.9*   Recent Labs  Lab 11/19/17 1150  LIPASE 24   Recent Labs  Lab 11/19/17 1255  AMMONIA 14   Coagulation Profile: No results for input(s): INR, PROTIME in the last 168 hours. Cardiac Enzymes: No results for input(s): CKTOTAL, CKMB, CKMBINDEX, TROPONINI in the last 168 hours. BNP (last 3 results) No results for input(s): PROBNP in the last 8760 hours. HbA1C: No results for input(s): HGBA1C in the last 72 hours. CBG: Recent Labs  Lab 11/21/17 1146 11/21/17 1703 11/21/17 2307 11/22/17 0806 11/22/17 1229  GLUCAP 150* 145* 128* 118* 135*   Lipid Profile: No results for input(s): CHOL, HDL, LDLCALC, TRIG, CHOLHDL, LDLDIRECT in the last 72  hours. Thyroid Function Tests: No results for input(s): TSH, T4TOTAL, FREET4, T3FREE, THYROIDAB in the last 72 hours. Anemia Panel: No results for input(s): VITAMINB12, FOLATE, FERRITIN, TIBC, IRON, RETICCTPCT in the last 72 hours.    Radiology Studies: I have reviewed all of the imaging during this hospital visit personally     Scheduled Meds: . aspirin EC  81 mg Oral Daily  . enoxaparin (LOVENOX) injection  30 mg Subcutaneous Q24H  . Gerhardt's butt cream   Topical QID  . insulin aspart  0-9 Units Subcutaneous TID WC  . latanoprost  1 drop Both Eyes QHS  . mouth rinse  15 mL Mouth Rinse BID  . metoprolol tartrate  2.5 mg Intravenous Q12H  . polyvinyl alcohol  1 drop Both Eyes BID  . tamsulosin  0.4 mg Oral QPC supper   Continuous Infusions: . sodium chloride 50 mL/hr at 11/21/17 2000  . ampicillin-sulbactam (UNASYN) IV 3 g (11/22/17 0404)     LOS: 3 days        Kimala Horne Annett Gula, MD [facility] Pager [phone]

## 2017-11-22 NOTE — NC FL2 (Signed)
[place] MEDICAID FL2 LEVEL OF CARE SCREENING TOOL     IDENTIFICATION  Patient Name: Caleb Nash Birthdate: [birth year] Sex: male Admission Date (Current Location): 11/19/2017  [facility] [place]   Facility and Address:  [facility], [address], [place], [place] [address]      Provider Number: [id number]  Attending Physician Name and Address:  Coralie KeensArrien, Mauricio Daniel,*  Relative Name and Phone Number:       Current Level of Care: Hospital Recommended Level of Care: Skilled Nursing Facility Prior Approval Number:    Date Approved/Denied:   PASRR Number: [id number]  Discharge Plan: SNF    Current Diagnoses: Patient Active Problem List   Diagnosis Date Noted  . Pressure injury of skin 11/21/2017  . HCAP (healthcare-associated pneumonia) 11/19/2017  . Pleural effusion on left 11/19/2017  . Acute renal failure superimposed on stage 4 chronic kidney disease (HCC) 11/19/2017  . Goals of care, counseling/discussion 11/19/2017  . Plantar fasciitis, left 09/21/2017  . Hypertensive heart disease with heart failure (HCC) 03/19/2017  . Congestive heart failure (CHF) (HCC) 12/12/2016  . Hypertensive kidney disease with CKD (chronic kidney disease) stage V (HCC) 05/23/2016  . Heel pain 07/13/2015  . Spinal stenosis of lumbar region 12/15/2014  . Synovial cyst of wrist 11/10/2014  . Edema 07/07/2014  . Cough 07/07/2014  . Seborrheic keratosis 01/13/2014  . Flatulence 01/13/2014  . DNR (do not resuscitate) 11/28/2013  . Restless leg 10/21/2013  . Dysphagia, unspecified(787.20) 09/09/2013  . Osteoarthritis   . Lumbago   . Hip pain, right 09/02/2013  . Seborrheic eczema 12/03/2012  . Insomnia 11/15/2012  . Sick sinus syndrome (HCC) 10/17/2012  . Essential hypertension 10/17/2012  . Constipation 09/13/2012  . GERD (gastroesophageal reflux disease) 09/13/2012  . Hypertensive heart and chronic kidney disease with heart failure  and with stage 5 chronic kidney disease, or end stage renal disease (HCC) 04/23/2012  . NSTEMI (non-ST elevated myocardial infarction) (HCC) 04/23/2012    Class: Acute  . Pericarditis 04/21/2012    Class: Acute  . CAD (coronary artery disease) 04/21/2012    Class: Chronic  . Tachy-brady syndrome (HCC) 04/21/2012    Class: Chronic  . Fall 04/21/2012    Class: Acute  . Pacemaker 04/21/2012    Class: Chronic  . Major depression, chronic 04/21/2012    Class: Chronic  . Paroxysmal atrial tachycardia (HCC) 04/21/2012    Class: Chronic  . Macular degeneration 04/21/2012    Class: Chronic  . Degenerative joint disease 04/21/2012    Class: Chronic  . Benign prostatic hyperplasia (BPH) with urinary urgency     Orientation RESPIRATION BLADDER Height & Weight     Self    Incontinent, Indwelling catheter Weight: 176 lb 12.9 oz (80.2 kg) Height:  5\' 10"  (177.8 cm)  BEHAVIORAL SYMPTOMS/MOOD NEUROLOGICAL BOWEL NUTRITION STATUS      Incontinent Diet(see DC summary)  AMBULATORY STATUS COMMUNICATION OF NEEDS Skin   Extensive Assist Verbally PU Stage and Appropriate Care   PU Stage 2 Dressing: (on buttocks- foam dressing changes PRN)                   Personal Care Assistance Level of Assistance  Bathing, Dressing Bathing Assistance: Maximum assistance   Dressing Assistance: Maximum assistance     Functional Limitations Info             SPECIAL CARE FACTORS FREQUENCY  PT (By licensed PT), OT (By licensed OT)  PT Frequency: 5/wk OT Frequency: 5/wk            Contractures      Additional Factors Info  Code Status, Allergies Code Status Info: DNR Allergies Info: Aleve Naproxen Sodium, Celebrex Celecoxib, Sulfur, Vioxx Rofecoxib           Current Medications (11/22/2017):  This is the current hospital active medication list Current Facility-Administered Medications  Medication Dose Route Frequency Provider Last Rate Last Dose  . 0.9 %  sodium chloride infusion    Intravenous Continuous Arrien, York Ram, MD 50 mL/hr at 11/21/17 2000    . acetaminophen (TYLENOL) tablet 650 mg  650 mg Oral Q6H PRN Jonah Blue, MD      . Ampicillin-Sulbactam (UNASYN) 3 g in sodium chloride 0.9 % 100 mL IVPB  3 g Intravenous Q12H Dang, Thuy D, RPH 200 mL/hr at 11/22/17 0404 3 g at 11/22/17 0404  . aspirin EC tablet 81 mg  81 mg Oral Daily Jonah Blue, MD   81 mg at 11/20/17 0846  . enoxaparin (LOVENOX) injection 30 mg  30 mg Subcutaneous Q24H Jonah Blue, MD   30 mg at 11/21/17 2134  . Gerhardt's butt cream   Topical QID Arrien, York Ram, MD      . hydroxypropyl methylcellulose / hypromellose (ISOPTO TEARS / GONIOVISC) 2.5 % ophthalmic solution 1 drop  1 drop Both Eyes QID PRN Jonah Blue, MD      . insulin aspart (novoLOG) injection 0-9 Units  0-9 Units Subcutaneous TID WC Jonah Blue, MD   1 Units at 11/21/17 1716  . latanoprost (XALATAN) 0.005 % ophthalmic solution 1 drop  1 drop Both Eyes QHS Jonah Blue, MD   1 drop at 11/21/17 2146  . MEDLINE mouth rinse  15 mL Mouth Rinse BID Osvaldo Shipper, MD   15 mL at 11/22/17 0917  . metoprolol tartrate (LOPRESSOR) injection 2.5 mg  2.5 mg Intravenous Steva Colder, MD   2.5 mg at 11/21/17 2135  . ondansetron (ZOFRAN) injection 4 mg  4 mg Intravenous Q6H PRN Jonah Blue, MD      . phenylephrine ((USE for PREPARATION-H)) 0.25 % suppository 1 suppository  1 suppository Rectal PRN Jonah Blue, MD      . polyvinyl alcohol (LIQUIFILM TEARS) 1.4 % ophthalmic solution 1 drop  1 drop Both Eyes BID Jonah Blue, MD   1 drop at 11/22/17 0916  . tamsulosin (FLOMAX) capsule 0.4 mg  0.4 mg Oral QPC supper Osvaldo Shipper, MD         Discharge Medications: Please see discharge summary for a list of discharge medications.  Relevant Imaging Results:  Relevant Lab Results:   Additional Information SS#: [id number]  Burna Sis, LCSW

## 2017-11-23 DIAGNOSIS — J690 Pneumonitis due to inhalation of food and vomit: Principal | ICD-10-CM

## 2017-11-23 DIAGNOSIS — Z9189 Other specified personal risk factors, not elsewhere classified: Secondary | ICD-10-CM

## 2017-11-23 DIAGNOSIS — N184 Chronic kidney disease, stage 4 (severe): Secondary | ICD-10-CM

## 2017-11-23 DIAGNOSIS — Z7189 Other specified counseling: Secondary | ICD-10-CM

## 2017-11-23 DIAGNOSIS — Z515 Encounter for palliative care: Secondary | ICD-10-CM

## 2017-11-23 LAB — GLUCOSE, CAPILLARY
GLUCOSE-CAPILLARY: 131 mg/dL — AB (ref 70–99)
GLUCOSE-CAPILLARY: 121 mg/dL — AB (ref 70–99)

## 2017-11-23 LAB — BASIC METABOLIC PANEL
ANION GAP: 12 (ref 5–15)
BUN: 54 mg/dL — AB (ref 8–23)
CO2: 17 mmol/L — ABNORMAL LOW (ref 22–32)
Calcium: 8.5 mg/dL — ABNORMAL LOW (ref 8.9–10.3)
Chloride: 129 mmol/L — ABNORMAL HIGH (ref 98–111)
Creatinine, Ser: 1.69 mg/dL — ABNORMAL HIGH (ref 0.61–1.24)
GFR calc Af Amer: 36 mL/min — ABNORMAL LOW (ref >60)
GFR, EST NON AFRICAN AMERICAN: 31 mL/min — AB (ref >60)
Glucose, Bld: 148 mg/dL — ABNORMAL HIGH (ref 70–99)
POTASSIUM: 3.7 mmol/L (ref 3.5–5.1)
SODIUM: 158 mmol/L — AB (ref 135–145)

## 2017-11-23 MED ORDER — DEXTROSE 5 % IV SOLN: INTRAVENOUS | Status: DC

## 2017-11-23 MED ORDER — AMOXICILLIN-POT CLAVULANATE 250-62.5 MG/5ML PO SUSR: 500.0000 mg | Freq: Two times a day (BID) | ORAL | 0 refills | Status: AC

## 2017-11-23 MED ORDER — MORPHINE SULFATE (CONCENTRATE) 10 MG/0.5ML PO SOLN: 10.0000 mg | ORAL | Status: DC | PRN

## 2017-11-23 MED ORDER — GLYCOPYRROLATE 0.2 MG/ML IJ SOLN: 0.2000 mg | INTRAMUSCULAR | Status: DC | PRN

## 2017-11-23 NOTE — Progress Notes (Signed)
CSW confirmed with family that [facility]. Patient will require PTAR.  Osborne Cascoadia Khelani Kops LCSW [phone]

## 2017-11-23 NOTE — Progress Notes (Signed)
[facility] will DC to: [facility] Anticipated DC date: 11/23/17 Family notified: Daughter and Media plannerGranddaughter Transport by: Dayna BarkerPTAR 2pm    Per MD [facility]. RN, patient, patient's family, and facility notified of DC. Discharge Summary sent to facility. RN given number for report [phone]). DC packet on chart. Ambulance transport requested for patient.   CSW signing off.  Cristobal GoldmannNadia Kajah Santizo, LCSW Clinical Social Worker [phone]

## 2017-11-23 NOTE — NC FL2 (Addendum)
MEDICAID FL2 LEVEL OF CARE SCREENING TOOL     IDENTIFICATION  Patient Name: Caleb Nash Birthdate: Sep 11, 1913 Sex: male Admission Date (Current Location): 11/19/2017  [facility] and [place] Number:  Producer, television/film/video and Address:  [facility], [address], [place], [place] [address]      Provider Number: [id number]  Attending Physician Name and Address:  Coralie Keens  Relative Name and Phone Number:       Current Level of Care: Hospital Recommended Level of Care: Skilled Nursing Facility Prior Approval Number:    Date Approved/Denied:   PASRR Number: [id number]  Discharge Plan: SNF    Current Diagnoses: Patient Active Problem List   Diagnosis Date Noted  . Pressure injury of skin 11/21/2017  . HCAP (healthcare-associated pneumonia) 11/19/2017  . Pleural effusion on left 11/19/2017  . Acute renal failure superimposed on stage 4 chronic kidney disease (HCC) 11/19/2017  . Goals of care, counseling/discussion 11/19/2017  . Plantar fasciitis, left 09/21/2017  . Hypertensive heart disease with heart failure (HCC) 03/19/2017  . Congestive heart failure (CHF) (HCC) 12/12/2016  . Hypertensive kidney disease with CKD (chronic kidney disease) stage V (HCC) 05/23/2016  . Heel pain 07/13/2015  . Spinal stenosis of lumbar region 12/15/2014  . Synovial cyst of wrist 11/10/2014  . Edema 07/07/2014  . Cough 07/07/2014  . Seborrheic keratosis 01/13/2014  . Flatulence 01/13/2014  . DNR (do not resuscitate) 11/28/2013  . Restless leg 10/21/2013  . Dysphagia, unspecified(787.20) 09/09/2013  . Osteoarthritis   . Lumbago   . Hip pain, right 09/02/2013  . Seborrheic eczema 12/03/2012  . Insomnia 11/15/2012  . Sick sinus syndrome (HCC) 10/17/2012  . Essential hypertension 10/17/2012  . Constipation 09/13/2012  . GERD (gastroesophageal reflux disease) 09/13/2012  . Hypertensive heart and chronic kidney disease with heart failure  and with stage 5 chronic kidney disease, or end stage renal disease (HCC) 04/23/2012  . NSTEMI (non-ST elevated myocardial infarction) (HCC) 04/23/2012    Class: Acute  . Pericarditis 04/21/2012    Class: Acute  . CAD (coronary artery disease) 04/21/2012    Class: Chronic  . Tachy-brady syndrome (HCC) 04/21/2012    Class: Chronic  . Fall 04/21/2012    Class: Acute  . Pacemaker 04/21/2012    Class: Chronic  . Major depression, chronic 04/21/2012    Class: Chronic  . Paroxysmal atrial tachycardia (HCC) 04/21/2012    Class: Chronic  . Macular degeneration 04/21/2012    Class: Chronic  . Degenerative joint disease 04/21/2012    Class: Chronic  . Benign prostatic hyperplasia (BPH) with urinary urgency     Orientation RESPIRATION BLADDER Height & Weight     Self    Incontinent, Indwelling catheter Weight: 80.2 kg (176 lb 12.9 oz) Height:  5\' 10"  (177.8 cm)  BEHAVIORAL SYMPTOMS/MOOD NEUROLOGICAL BOWEL NUTRITION STATUS      Incontinent Diet(see DC summary)  AMBULATORY STATUS COMMUNICATION OF NEEDS Skin   Extensive Assist Verbally PU Stage and Appropriate Care   PU Stage 2 Dressing: (on buttocks- foam dressing changes PRN)                   Personal Care Assistance Level of Assistance  Bathing, Dressing Bathing Assistance: Maximum assistance   Dressing Assistance: Maximum assistance     Functional Limitations Info             SPECIAL CARE FACTORS FREQUENCY  Contractures      Additional Factors Info  Code Status, Allergies Code Status Info: DNR Allergies Info: Aleve Naproxen Sodium, Celebrex Celecoxib, Sulfur, Vioxx Rofecoxib           Current Medications (11/23/2017):  This is the current hospital active medication list Current Facility-Administered Medications  Medication Dose Route Frequency Provider Last Rate Last Dose  . acetaminophen (TYLENOL) tablet 650 mg  650 mg Oral Q6H PRN Jonah BlueYates, Jennifer, MD      . Gerhardt's butt cream    Topical QID Arrien, York RamMauricio Daniel, MD   Stopped at 11/22/17 2302  . glycopyrrolate (ROBINUL) injection 0.2 mg  0.2 mg Intravenous Q4H PRN Alita ChyleMason, Megan M, NP      . hydroxypropyl methylcellulose / hypromellose (ISOPTO TEARS / GONIOVISC) 2.5 % ophthalmic solution 1 drop  1 drop Both Eyes QID PRN Jonah BlueYates, Jennifer, MD      . latanoprost (XALATAN) 0.005 % ophthalmic solution 1 drop  1 drop Both Eyes QHS Jonah BlueYates, Jennifer, MD   1 drop at 11/22/17 2303  . MEDLINE mouth rinse  15 mL Mouth Rinse BID Osvaldo ShipperKrishnan, Gokul, MD   15 mL at 11/22/17 2301  . metoprolol tartrate (LOPRESSOR) injection 2.5 mg  2.5 mg Intravenous Q12H Jonah BlueYates, Jennifer, MD   Stopped at 11/22/17 2313  . morphine CONCENTRATE 10 MG/0.5ML oral solution 10 mg  10 mg Oral Q2H PRN Alita ChyleMason, Megan M, NP      . ondansetron [facility](ZOFRAN) injection 4 mg  4 mg Intravenous Q6H PRN Jonah BlueYates, Jennifer, MD      . phenylephrine ((USE for PREPARATION-H)) 0.25 % suppository 1 suppository  1 suppository Rectal PRN Jonah BlueYates, Jennifer, MD      . polyvinyl alcohol (LIQUIFILM TEARS) 1.4 % ophthalmic solution 1 drop  1 drop Both Eyes BID Jonah BlueYates, Jennifer, MD   1 drop at 11/22/17 2303  . tamsulosin (FLOMAX) capsule 0.4 mg  0.4 mg Oral QPC supper Osvaldo ShipperKrishnan, Gokul, MD         Discharge Medications: Please see discharge summary for a list of discharge medications.  Relevant Imaging Results:  Relevant Lab Results:   Additional Information SS#: [id number]   Add Hospice  Mearl LatinNadia S Jaret Coppedge, [place]

## 2017-11-23 NOTE — Progress Notes (Signed)
OT Cancellation Note  Patient Details Name: Tobe Sosrthur W Genova MRN: [id number] DOB: 09-23-1913   Cancelled Treatment:    Reason Eval/Treat Not Completed: Other (comment). Pt now comfort measures only, OT will sign off  Galen ManilaSpencer, Cale Bethard Jeanette 11/23/2017, 12:10 PM

## 2017-11-23 NOTE — Consult Note (Signed)
Consultation Note Date: 11/23/2017   Patient Name: Caleb Nash  DOB: 25-Jan-1914  MRN: [id number]  Age / Sex: 84 y.o., male  PCP: Blanchie Serve, MD Referring Physician: No att. providers found  Reason for Consultation: Establishing goals of care and Terminal Care  HPI/Patient Profile: 84 y.o. male  with past medical history of afib, pacemaker, BPH, HTN, HLD, CAD admitted on 11/19/2017 with weakness and decreased oral intake. Recently treated for pneumonia. CT head negative. Chest xray revealed left upper lobe infiltrate. Abdominal CT revealed large left sided pleural effusion and mild impaction. Patient diagnosed with aspiration pneumonia and antibiotics initiated. SLP evaluated with MBS performed which determined high risk for continued aspiration with recommendation for NPO status. Palliative medicine consultation for goals of care.   Clinical Assessment and Goals of Care:  I have reviewed medical records, discussed with care team and met with daughter and granddaughter Marcie Bal and Sharee Pimple) at bedside to discuss diagnosis, prognosis, GOC, EOL wishes, disposition and options.  Introduced Palliative Medicine as specialized medical care for people living with serious illness. It focuses on providing relief from the symptoms and stress of a serious illness. The goal is to improve quality of life for both the patient and the family.  We discussed a brief life review of the patient. He has been living at [facility] for about 17 years. They were recently transitioning him to skilled nursing side due to increased weakness requiring more assistance with ADL's. Appetite has been poor. Per daughter, he has had ongoing difficulty with swallowing/gagging.   I attempted to elicit values and goals of care important to the patient and family. Advanced directives, concepts specific to code status, artifical feeding and  hydration, and rehospitalization were considered and discussed. MOST form and living will reviewed in epic. Marcie Bal and Sharee Pimple are clear on their father's wishes against heroic measures at EOL. They confirm DNR/DNI and NO feeding tube. They understand the risk for aspiration and wish for him to be allowed comfort feeds since he has been asking for food and drink.   The difference between aggressive medical intervention and comfort care was considered in light of the patient's goals of care. Marcie Bal confirms her desire to shift to COMFORT focused care understanding poor prognosis. She speaks of a conversation with her father the other day, explaining that he shared he was ready to die and that she told him it was ok to go.   Educated on hospice philosophy and goal of comfort including peace and dignity at EOL. Marcie Bal and Sharee Pimple are hopeful to get him back to [facility] today with hospice services. Educated on EOL expectations and utilization of medications to relieve pain and suffering. Lestine Box agree with plan for comfort and discharge back to SNF with hospice.  Questions and concerns were addressed. Therapeutic listening and emotional support provided. PMT contact information given.    SUMMARY OF RECOMMENDATIONS    Daughter confirms patient wishes against heroic measures at EOL. Confirmed DNR/DNI and NO feeding tube. Reviewed MOST  form.   Daughter wishes to transition to comfort measures only, understanding poor prognosis and continued risk for aspiration.   Allow comfort feeds per patient/family request.   Symptom management--see below.   SW consulted to arrange discharge back to SNF with hospice services. Family hopeful to discharge today. Family understands interventions not aimed at comfort will be discontinued, including IVF and ABX.   Code Status/Advance Care Planning:  DNR  Symptom Management:   Roxanol 24m SL q2h prn pain/dyspnea/air hunger  Robinul 0.231mIV q4h prn  secretions  Palliative Prophylaxis:   Aspiration, Delirium Protocol, Frequent Pain Assessment, Oral Care and Turn Reposition  Additional Recommendations (Limitations, Scope, Preferences):  Full Comfort Care  Psycho-social/Spiritual:   Desire for further Chaplaincy support: yes  Additional Recommendations: Caregiving  Support/Resources and Education on Hospice  Prognosis:   < 2 weeks secondary to aspiration pneumonia and high risk for continued aspiration. Family opting against feeding tube and to allow comfort feeds, knowing high risk for aspiration leading to decline/EOL.   [facility]      Primary Diagnoses: Present on Admission: . HCAP (healthcare-associated pneumonia) . Benign prostatic hyperplasia (BPH) with urinary urgency . Constipation . Essential hypertension . Pleural effusion on left . Acute renal failure superimposed on stage 4 chronic kidney disease (HC[place]umb[place]  I have reviewed the medical record, interviewed the patient and family, and examined the patient. The following aspects are pertinent.  Past Medical History:  Diagnosis Date  . Abnormality of gait 06/11/2012  . Alcohol abuse 04/26/2012  . Allergy, unspecified not elsewhere classified 04/28/2012  . Atrial fibrillation (HCV[place] Clark[place]1/08/2012  . BPH (benign prostatic hyperplasia)   . Cardiac pacemaker in situ 04/28/2012  . CHF (congestive heart failure) (HCNew Trenton12/31/2013  . Chronic airway obstruction, not elsewhere classified 04/26/2012  . Coronary atherosclerosis of native coronary artery 04/28/2012  . Degenerative joint disease 04/21/2012  . Depression    mild per pt  . Essential hypertension 10/17/2012  . GERD (gastroesophageal reflux disease) 09/13/2012  . History of rhabdomyolysis    s/p fall with prolonged downtime  . Hyperlipidemia   . Ingrowing nail 06/11/2012  . Insomnia 11/15/2012  . Insomnia, unspecified 04/28/2012  . Lumbago   . Macular degeneration   . Muscle  weakness (generalized) 04/26/2012  . Osteoarthritis   . Other specified disease of white blood cells 04/26/2012  . Paroxysmal atrial tachycardia (HCGlen Osborne12/29/2013   Brief episodes. Not felt to be an anticoagulation candidate because of age and frailty   . Pericarditis 04/21/2012  . Personal history of fall 04/28/2012  . Reflux esophagitis 04/28/2012  . Seborrheic eczema 12/03/2012  . Sick sinus syndrome (HCSublette6/22/2005; 10/29/2012   MDT EnPulse implanted by Dr EdLeonia Reevesor SSS; generator change 10/29/2012 by Dr AlRayann HemanDT SeSherril Croonacemaker  . Sporotrichosis    remote  . Synovial cyst of wrist 11/10/2014   Left wrist    . Tachy-brady syndrome (HCLaurens12/29/2013   Pacemaker is near EOL   . Unspecified constipation 09/13/2012  . Unspecified glaucoma(365.9) 04/28/2012   Social History   Socioeconomic History  . Marital status: Single    Spouse name: Not on file  . Number of children: Not on file  . Years of education: Not on file  . Highest education level: Not on file  Occupational History  . Occupation: retired CPEngineer, maintenance (IT)Social Needs  . Financial resource strain: Not on file  . Food insecurity:    Worry: Not on file  Inability: Not on file  . Transportation needs:    Medical: Not on file    Non-medical: Not on file  Tobacco Use  . Smoking status: Never Smoker  . Smokeless tobacco: Never Used  Substance and Sexual Activity  . Alcohol use: No  . Drug use: No  . Sexual activity: Never  Lifestyle  . Physical activity:    Days per week: Not on file    Minutes per session: Not on file  . Stress: Not on file  Relationships  . Social connections:    Talks on phone: Not on file    Gets together: Not on file    Attends religious service: Not on file    Active member of club or organization: Not on file    Attends meetings of clubs or organizations: Not on file    Relationship status: Not on file  Other Topics Concern  . Not on file  Social History Narrative   Pt lives in assisted living at  [facility] following a fall 12/13.   Retired Engineer, maintenance (IT).   Widowed   No siblings    MOST  Form signed   DNR   Walks with walker   Exercise -self in room each morning    Family History  Problem Relation Age of Onset  . CVA Mother   . CVA Father   . Cancer Unknown    Scheduled Meds: . Gerhardt's butt cream   Topical QID  . latanoprost  1 drop Both Eyes QHS  . mouth rinse  15 mL Mouth Rinse BID  . metoprolol tartrate  2.5 mg Intravenous Q12H  . polyvinyl alcohol  1 drop Both Eyes BID  . tamsulosin  0.4 mg Oral QPC supper   Continuous Infusions: PRN Meds:.acetaminophen, glycopyrrolate, hydroxypropyl methylcellulose / hypromellose, morphine CONCENTRATE, ondansetron (ZOFRAN) IV, phenylephrine Medications Prior to Admission:  Prior to Admission medications   Medication Sig Start Date End Date Taking? Authorizing Provider  acetaminophen (TYLENOL) 325 MG tablet Take 650 mg by mouth every 4 (four) hours as needed.   Yes [provider]  amoxicillin (AMOXIL) 500 MG tablet Take 2,000 mg by mouth as needed. Take one tablet one hour prior to surgery    Yes [provider]  aspirin 81 MG tablet Take 81 mg by mouth daily.   Yes [provider]  bimatoprost (LUMIGAN) 0.01 % SOLN Place 1 drop into both eyes at bedtime.    Yes [provider]  carbamide peroxide (DEBROX) 6.5 % otic solution 5 drops into affected ear for 3 nights as needed for ear wax at bedtime   Yes [provider]  finasteride (PROSCAR) 5 MG tablet Take 5 mg by mouth daily. Take one tablet by mouth once daily    Yes [provider]  fluticasone (FLONASE) 50 MCG/ACT nasal spray Place 1 spray into both nostrils daily.  11/27/13  Yes [provider]  furosemide (LASIX) 20 MG tablet Take 20 mg by mouth 2 (two) times daily as needed for fluid or edema (for increased weight of 2-3 lbs).   Yes [provider]  guaiFENesin (MUCINEX) 600 MG 12 hr tablet Take 600 mg by  mouth 2 (two) times daily.    Yes [provider]  hydroxypropyl methylcellulose / hypromellose (ISOPTO TEARS / GONIOVISC) 2.5 % ophthalmic solution Place 1 drop into both eyes 4 (four) times daily as needed (macular degeneration).   Yes [provider]  KLOR-CON M10 10 MEQ tablet Take 1  tablet by mouth twice daily  as needed with Lasix 12/04/12  Yes [provider]  lisinopril (PRINIVIL,ZESTRIL) 20 MG tablet Take 20 mg by mouth daily. Take one tablet daily for blood pressure 12/13/13  Yes [provider]  loperamide (IMODIUM) 2 MG capsule Take 4 mg by mouth as needed for diarrhea or loose stools.   Yes [provider]  loratadine (CLARITIN) 10 MG tablet Take 10 mg by mouth daily. Reported on 07/13/2015   Yes [provider]  magnesium hydroxide (MILK OF MAGNESIA) 400 MG/5ML suspension Take 30 mLs by mouth as needed for mild constipation or moderate constipation (MOM 30 ml po, QD PRN 24 hours, Ckeck for hard stool in the rectum. Remove digitally of present. Fleet's enema for rectum c1 dose ater removing hard stool from rectum. DO NOT USE FOR CHRONIC KIDNEY DISEASE stage 4 or HEMODIALSIS PT.).    Yes [provider]  methadone (DOLOPHINE) 5 MG tablet Take one tablet by mouth every morning for pain; Take One and Half tablet (7.82m) by mouth every evening for pain. Patient taking differently: Take 5-7.5 mg by mouth See admin instructions. Take 5 mg  tablet by mouth every morning for pain; Take  (7.55m by mouth every evening for pain. 10/19/17  Yes PaBlanchie ServeMD  metoprolol tartrate (LOPRESSOR) 25 MG tablet Take 25 mg by mouth 2 (two) times daily.   Yes [provider]  Multiple Vitamins-Minerals (ICAPS PO) Take 1 tablet by mouth daily.    Yes [provider]  nitroGLYCERIN (NITROSTAT) 0.4 MG SL tablet Place 0.4 mg under the tongue every 5 (five) minutes as needed. Reported on 07/13/2015   Yes [provider]   omeprazole (PRILOSEC) 20 MG capsule Take 20 mg by mouth daily.   Yes [provider]  phenylephrine (,USE FOR PREPARATION-H,) 0.25 % suppository Place 1 suppository rectally as needed for hemorrhoids (Apply to hemorrohids area up to 6 x day PRn for Hemorrhoids).   Yes [provider]  Polyethyl Glycol-Propyl Glycol 0.4-0.3 % SOLN Apply 1 drop to eye 2 (two) times daily. Both eyes   Yes [provider]  polyethylene glycol (MIRALAX / GLYCOLAX) packet Take 17 g by mouth at bedtime.    Yes [provider]  potassium chloride (K-DUR) 10 MEQ tablet Take 10 mEq by mouth 2 (two) times daily as needed.   Yes [provider]  saccharomyces boulardii (FLORASTOR) 250 MG capsule Take 250 mg by mouth daily.   Yes [provider]  sertraline (ZOLOFT) 25 MG tablet Take 25 mg by mouth at bedtime.   Yes [provider]  Tamsulosin HCl (FLOMAX) 0.4 MG CAPS Take 0.4 mg by mouth daily.   Yes [provider]  zolpidem (AMBIEN) 5 MG tablet Take one tablet by mouth at bedtime for rest 07/09/17  Yes PaBlanchie ServeMD   Allergies  Allergen Reactions  . Aleve [Naproxen Sodium]   . Celebrex [Celecoxib]     Lips swelled  . Sulfur     Lips swelled  . Vioxx [Rofecoxib] Other (See Comments)    unknown   Review of Systems  Unable to perform ROS: Acuity of condition   Physical Exam  Constitutional: He appears lethargic. He appears ill.  HENT:  Head: Normocephalic and atraumatic.  Pulmonary/Chest: No accessory muscle usage. No tachypnea. No respiratory distress. He has decreased breath sounds.  Abdominal: There is no tenderness.  Neurological: He appears lethargic.  Drowsy, will answer few questions  Skin: Skin  is warm and dry. There is pallor.  Psychiatric: His speech is delayed. Cognition and memory are impaired. He is inattentive.  Nursing note and vitals reviewed.  Vital Signs: BP (!) 155/71 (BP Location: Left Arm)   Pulse 60   Temp (!)  97.4 F (36.3 C) (Oral)   Resp (!) 40   Ht 5' 10"  (1.778 m)   Wt 80.2 kg (176 lb 12.9 oz)   SpO2 97%   BMI 25.37 kg/m  Pain Scale: 0-10   Pain Score: Asleep   SpO2: SpO2: 97 % O2 Device:SpO2: 97 % O2 Flow Rate: .   IO: Intake/output summary:   Intake/Output Summary (Last 24 hours) at 11/23/2017 1622 Last data filed at 11/23/2017 1200 Gross per 24 hour  Intake 1182.13 ml  Output 775 ml  Net 407.13 ml    LBM: Last BM Date: 11/23/17 Baseline Weight: Weight: 77.1 kg (170 lb) Most recent weight: Weight: 80.2 kg (176 lb 12.9 oz)     Palliative Assessment/Data: PPS 20%   Flowsheet Rows     Most Recent Value  Intake Tab  Referral Department  Hospitalist  Unit at Time of Referral  Med/Surg Unit  Palliative Care Primary Diagnosis  Sepsis/Infectious Disease  Palliative Care Type  New Palliative care  Reason for referral  Clarify Goals of Care  Date first seen by Palliative Care  11/20/17  Clinical Assessment  Palliative Performance Scale Score  20%  Psychosocial & Spiritual Assessment  Palliative Care Outcomes  Patient/Family meeting held?  Yes  Who was at the meeting?  daughter, granddaughter  Palliative Care Outcomes  Clarified goals of care, Improved pain interventions, Improved non-pain symptom therapy, Counseled regarding hospice, Provided end of life care assistance, Provided psychosocial or spiritual support, ACP counseling assistance, Changed to focus on comfort, Transitioned to hospice      Time In: 1000 Time Out: 1050 Time Total: 40mn Greater than 50%  of this time was spent counseling and coordinating care related to the above assessment and plan.  Signed by:  MIhor Dow FNP-C Palliative Medicine Team  Phone: [phone]: [phone] Please contact Palliative Medicine Team phone at [phone] questions and concerns.  For individual provider: See AShea Evans

## 2017-11-23 NOTE — Clinical Social Work Placement (Addendum)
   CLINICAL SOCIAL WORK PLACEMENT  NOTE  Date:  11/23/2017  Patient Details  Name: Caleb Nash MRN: [id number] Date of Birth: October 06, 1913  Clinical Social Work is seeking post-discharge placement for this patient at the Skilled  Nursing Facility level of care (*CSW will initial, date and re-position this form in  chart as items are completed):  Yes   Patient/family provided with [place] Clinical Social Work Department's list of facilities offering this level of care within the geographic area requested by the patient (or if unable, by the patient's family).  Yes   Patient/family informed of their freedom to choose among providers that offer the needed level of care, that participate in Medicare, Medicaid or managed care program needed by the patient, have an available bed and are willing to accept the patient.  Yes   Patient/family informed of [place]'s ownership interest in [facility] and [facility], as well as of the fact that they are under no obligation to receive care at these facilities.  PASRR submitted to EDS on       PASRR number received on       Existing PASRR number confirmed on 11/23/17     FL2 transmitted to all facilities in geographic area requested by pt/family on 11/23/17     FL2 transmitted to all facilities within larger geographic area on       Patient informed that his/her managed care company has contracts with or will negotiate with certain facilities, including the following:        Yes   Patient/family informed of bed offers received.  [facility]     Physician recommends and patient chooses bed at      [facility]  Patient to be transferred to facility by [facility]     Patient family notified on 11/23/17 of transfer.  Name of family member notified:  Daughter and grandaughter     PHYSICIAN       Additional Comment:     _______________________________________________ Mearl LatinNadia S Broedy Osbourne, LCSWA 11/23/2017, 12:14 PM

## 2017-11-23 NOTE — Discharge Summary (Addendum)
Physician Discharge Summary  Caleb Nash WUJ:[id number] DOB: Aug 16, 1913 DOA: 11/19/2017  PCP: Oneal Grout, MD  Admit date: 11/19/2017 Discharge date: 11/23/2017  Admitted From: Home (assisted living) Disposition:  SNF with hospice services  Recommendations for Outpatient Follow-up and new medication changes:  1. Follow up with Dr. Glade Lloyd in 7 days 2. Patient has been placed under hospice services 3. Allowed to eat pureed diet with aspiration precautions 4. Keep head of the bed elevated (45 degrees) at all times as tolerated 5. Will stop lisinopril, potassium supplements and furosemide due to AKI and poor oral intake.  6. Complete antibiotic therapy with Augmentin for the next 5 days.  7. Patient did not required any pain control during his hospitalization hold on methadone and zolpidem for now.   Home Health: Hospice   Equipment/Devices: no    Discharge Condition: stable  CODE STATUS: DNR  Diet recommendation: Pureed diet with aspiration precautions.   Brief/Interim Summary: 84 year old male who presented with weakness. He does have significant past medical history for paroxysmal atrial fibrillation, hypertension, dyslipidemia, coronary artery disease and he is status post pacemaker. Patient was noted to have progressive weakness over the last 2 weeks prior to hospitalization, associated with decreased appetite and decreased oral intake. Recently treated for pneumonia. On the day of admission he was noted to be less responsive and tachycardic thentransported to the emergency room. On the initial physical examination blood pressure 127/60, heart rate 57, respiratory rate 22, oxygen saturation 81%. He was obtunded, confused and disoriented, his lungs had no wheezing, or rhonchi but bibasilar rales, heart S1-S2 present and rhythmic, abdomen soft nontender, positive lower extremity edema.Sodium 142, potassium 4.5, chloride 112, bicarb 21, glucose 179, BUN 79, creatinine 2.39, white  count 10.4, hemoglobin 8.6 hematocrit 30.4, platelets 259.Urinalysis specific gravity 1.015. Head CT with atrophy, no acute changes. Abdominal CT with large sided pleural effusion, nonobstructing left renal stone, fecal material within the rectum which may represent mild impaction.Chest x-ray with left upper lobe infiltrate. Sinus rhythm, with precordial Q waves. Positive artifact.  Patient was admitted to the hospital with the working diagnosis of aspiration pneumonia, complicated by acute hypoxic respiratory failure   1.  Acute hypoxic respiratory failure due to right upper lobe aspiration pneumonia, present on admission.  Patient was admitted to the medical unit, he was placed on remote telemetry monitor, IV broad-spectrum antibiotic therapy with Unasyn, patient remained afebrile white cell count elevated up to 16 on the day of discharge.  His blood culture remain no growth.  Further swallow evaluation determined high risk of aspiration, recommendations to keep patient nothing by mouth.  Patient with poor prognosis, considering his aspiration pneumonia, her family decided to pursue palliative care services.  Family declined option of feeding tube, appropriate for patient's condition and prognosis.  Patient will be discharged home with home hospice.  Will allow him to eat pured diet with aspiration precautions.  Oxygen saturation at discharge 97% on room air.Will continue antibiotic therapy with Augmentin for the next 5 days.   2.  Acute metabolic encephalopathy.  Patient remained confused, and disoriented.  No significant agitation, he was able to answer simple questions intermittently.  He was seen by physical therapy, recommendations for skilled nursing facility at discharge.  Due to poor prognosis patient's family has decided to continue care at assisted living facility under hospice services.  3.  Prerenal acute kidney injury on chronic kidney disease stage IV, with hypernatremia,  hyperchloremia and non-anion gap metabolic acidosis.  Patient was treated  initially with isotonic saline  and his kidney function improved with a serum creatinine down to 1.69 at discharge. He has developed worsening hypernatremia and hyperchloremia.  IV  fluids have been changed to D5 water.  His ability of water intake is significantly impaired due to his swallow dysfunction.  Encourage p.o. intake as an outpatient.  Patient did have urine retention in the emergency department and Foley catheter was placed. Continue finasteride and tamsulisin.   4.  Hypertension.  Continue metoprolol for blood pressure control.  5. Depression and chronic pain syndrome. Continue sertraline. Patient did not receive any methadone during his hospitalization, no pain or signs of withdrawal, will continue to hold this agent for now.   6. Chronic diastolic heart failure with no exacerbation.   Discharge Diagnoses:  Principal Problem:   HCAP (healthcare-associated pneumonia) Active Problems:   Benign prostatic hyperplasia (BPH) with urinary urgency   Constipation   Essential hypertension   Pleural effusion on left   Acute renal failure superimposed on stage 4 chronic kidney disease (HCC)   Goals of care, counseling/discussion   Pressure injury of skin    Discharge Instructions   Allergies as of 11/23/2017      Reactions   Aleve [naproxen Sodium]    Celebrex [celecoxib]    Lips swelled   Sulfur    Lips swelled   Vioxx [rofecoxib] Other (See Comments)   unknown      Medication List    STOP taking these medications   amoxicillin 500 MG tablet Commonly known as:  AMOXIL   furosemide 20 MG tablet Commonly known as:  LASIX   KLOR-CON M10 10 MEQ tablet Generic drug:  potassium chloride   lisinopril 20 MG tablet Commonly known as:  PRINIVIL,ZESTRIL   methadone 5 MG tablet Commonly known as:  DOLOPHINE   potassium chloride 10 MEQ tablet Commonly known as:  K-DUR   zolpidem 5 MG  tablet Commonly known as:  AMBIEN     TAKE these medications   acetaminophen 325 MG tablet Commonly known as:  TYLENOL Take 650 mg by mouth every 4 (four) hours as needed.   amoxicillin-clavulanate 250-62.5 MG/5ML suspension Commonly known as:  AUGMENTIN Take 10 mLs (500 mg total) by mouth 2 (two) times daily for 5 days.   aspirin 81 MG tablet Take 81 mg by mouth daily.   bimatoprost 0.01 % Soln Commonly known as:  LUMIGAN Place 1 drop into both eyes at bedtime.   carbamide peroxide 6.5 % OTIC solution Commonly known as:  DEBROX 5 drops into affected ear for 3 nights as needed for ear wax at bedtime   finasteride 5 MG tablet Commonly known as:  PROSCAR Take 5 mg by mouth daily. Take one tablet by mouth once daily   fluticasone 50 MCG/ACT nasal spray Commonly known as:  FLONASE Place 1 spray into both nostrils daily.   guaiFENesin 600 MG 12 hr tablet Commonly known as:  MUCINEX Take 600 mg by mouth 2 (two) times daily.   hydroxypropyl methylcellulose / hypromellose 2.5 % ophthalmic solution Commonly known as:  ISOPTO TEARS / GONIOVISC Place 1 drop into both eyes 4 (four) times daily as needed (macular degeneration).   ICAPS PO Take 1 tablet by mouth daily.   loperamide 2 MG capsule Commonly known as:  IMODIUM Take 4 mg by mouth as needed for diarrhea or loose stools.   loratadine 10 MG tablet Commonly known as:  CLARITIN Take 10 mg by mouth daily. Reported on 07/13/2015  magnesium hydroxide 400 MG/5ML suspension Commonly known as:  MILK OF MAGNESIA Take 30 mLs by mouth as needed for mild constipation or moderate constipation (MOM 30 ml po, QD PRN 24 hours, Ckeck for hard stool in the rectum. Remove digitally of present. Fleet's enema for rectum c1 dose ater removing hard stool from rectum. DO NOT USE FOR CHRONIC KIDNEY DISEASE stage 4 or HEMODIALSIS PT.).   metoprolol tartrate 25 MG tablet Commonly known as:  LOPRESSOR Take 25 mg by mouth 2 (two) times daily.    nitroGLYCERIN 0.4 MG SL tablet Commonly known as:  NITROSTAT Place 0.4 mg under the tongue every 5 (five) minutes as needed. Reported on 07/13/2015   omeprazole 20 MG capsule Commonly known as:  PRILOSEC Take 20 mg by mouth daily.   phenylephrine 0.25 % suppository Commonly known as:  (USE for PREPARATION-H) Place 1 suppository rectally as needed for hemorrhoids (Apply to hemorrohids area up to 6 x day PRn for Hemorrhoids).   Polyethyl Glycol-Propyl Glycol 0.4-0.3 % Soln Apply 1 drop to eye 2 (two) times daily. Both eyes   polyethylene glycol packet Commonly known as:  MIRALAX / GLYCOLAX Take 17 g by mouth at bedtime.   saccharomyces boulardii 250 MG capsule Commonly known as:  FLORASTOR Take 250 mg by mouth daily.   sertraline 25 MG tablet Commonly known as:  ZOLOFT Take 25 mg by mouth at bedtime.   tamsulosin 0.4 MG Caps capsule Commonly known as:  FLOMAX Take 0.4 mg by mouth daily.      Contact information for after-discharge care    Destination    [facility] .   Service:  Skilled Nursing Contact information: [address] [place] [address] [phone]             Allergies  Allergen Reactions  . Aleve [Naproxen Sodium]   . Celebrex [Celecoxib]     Lips swelled  . Sulfur     Lips swelled  . Vioxx [Rofecoxib] Other (See Comments)    unknown    Consultations:  Palliative care   Procedures/Studies: Ct Abdomen Pelvis Wo Contrast  Result Date: 11/19/2017 CLINICAL DATA:  Abdominal pain EXAM: CT ABDOMEN AND PELVIS WITHOUT CONTRAST TECHNIQUE: Multidetector CT imaging of the abdomen and pelvis was performed following the standard protocol without IV contrast. COMPARISON:  None. FINDINGS: Lower chest: Considerable left-sided pleural effusion is noted. This may be complicated in nature given its appearance superiorly. Some increased density is noted within the collapsed left lower lobe which may be related to  aspirated material. Hepatobiliary: No focal liver abnormality is seen. No gallstones, gallbladder wall thickening, or biliary dilatation. Pancreas: Unremarkable. No pancreatic ductal dilatation or surrounding inflammatory changes. Spleen: Normal in size without focal abnormality. Adrenals/Urinary Tract: Adrenal glands are within normal limits. The kidneys demonstrate no obstructive change. A nonobstructing left renal stone measuring 2 mm is noted. The ureters are within normal limits. The bladder is partially distended. Stomach/Bowel: Fecal material is noted within the rectum which may be related to a mild degree of impaction. No obstructive changes are seen. No inflammatory changes are identified. The appendix has been surgically removed Vascular/Lymphatic: Aortic atherosclerosis. No enlarged abdominal or pelvic lymph nodes. Reproductive: Prostate is within normal limits. Other: No abdominal wall hernia or abnormality. No abdominopelvic ascites. Musculoskeletal: Degenerative changes of lumbar spine are seen. No acute bony abnormality is noted. IMPRESSION: Large left-sided pleural effusion with findings suggestive of aspiration and possible complicated nature of the effusion. Nonobstructing left  renal stone. Fecal material within the rectum which may represent a mild impaction. Electronically Signed   By: Alcide Clever M.D.   On: 11/19/2017 14:41   Ct Head Wo Contrast  Result Date: 11/19/2017 CLINICAL DATA:  Altered mental status EXAM: CT HEAD WITHOUT CONTRAST TECHNIQUE: Contiguous axial images were obtained from the base of the skull through the vertex without intravenous contrast. COMPARISON:  CT head 04/21/2012 FINDINGS: Brain: Moderate atrophy with mild progression. Chronic microvascular ischemic changes in the white matter, with progression. Negative for acute infarct, hemorrhage, mass.  No midline shift. Vascular: Negative for hyperdense vessel Skull: Negative Sinuses/Orbits: Mild mucosal edema paranasal  sinuses. Bilateral cataract surgery Other: None IMPRESSION: Atrophy and chronic microvascular ischemia have progressed since 2013. No acute abnormality. Electronically Signed   By: Marlan Palau M.D.   On: 11/19/2017 14:37   Dg Chest Portable 1 View  Result Date: 11/19/2017 CLINICAL DATA:  Acute mental status change. EXAM: PORTABLE CHEST 1 VIEW COMPARISON:  April 23, 2012 FINDINGS: There is significant opacity in the left lower lung with convex border superiorly. An oval density is also seen in the left upper lung. No pneumothorax. The cardiomediastinal silhouette is unchanged. No other acute abnormalities. IMPRESSION: There is significant opacity in the left mid and lower lung. While this could represent an unusual effusion with underlying opacity, an underlying pneumonia or mass are not excluded. The oval low-density finding in the left upper lobe could represent fluid in a fissure or a mass. The patient may benefit from a PA and lateral chest x-ray or chest CT for further evaluation. Electronically Signed   By: Gerome Sam III M.D   On: 11/19/2017 12:53   Dg Swallowing Func-speech Pathology  Result Date: 11/21/2017 Objective Swallowing Evaluation: Type of Study: MBS-Modified Barium Swallow Study  Patient Details Name: Caleb Nash MRN: [id number] Date of Birth: 1913/10/03 Today's Date: 11/21/2017 Time: SLP Start Time (ACUTE ONLY): 1310 -SLP Stop Time (ACUTE ONLY): 1345 SLP Time Calculation (min) (ACUTE ONLY): 35 min Past Medical History: Past Medical History: Diagnosis Date . Abnormality of gait 06/11/2012 . Alcohol abuse 04/26/2012 . Allergy, unspecified not elsewhere classified 04/28/2012 . Atrial fibrillation (HCC) 04/28/2012 . BPH (benign prostatic hyperplasia)  . Cardiac pacemaker in situ 04/28/2012 . CHF (congestive heart failure) (HCC) 04/23/2012 . Chronic airway obstruction, not elsewhere classified 04/26/2012 . Coronary atherosclerosis of native coronary artery 04/28/2012 . Degenerative joint disease  04/21/2012 . Depression   mild per pt . Essential hypertension 10/17/2012 . GERD (gastroesophageal reflux disease) 09/13/2012 . History of rhabdomyolysis   s/p fall with prolonged downtime . Hyperlipidemia  . Ingrowing nail 06/11/2012 . Insomnia 11/15/2012 . Insomnia, unspecified 04/28/2012 . Lumbago  . Macular degeneration  . Muscle weakness (generalized) 04/26/2012 . Osteoarthritis  . Other specified disease of white blood cells 04/26/2012 . Paroxysmal atrial tachycardia (HCC) 04/21/2012  Brief episodes. Not felt to be an anticoagulation candidate because of age and frailty  . Pericarditis 04/21/2012 . Personal history of fall 04/28/2012 . Reflux esophagitis 04/28/2012 . Seborrheic eczema 12/03/2012 . Sick sinus syndrome (HCC) 10/14/2003; 10/29/2012  MDT EnPulse implanted by Dr Amil Amen for SSS; generator change 10/29/2012 by Dr Johney Frame MDT Jana Half pacemaker . Sporotrichosis   remote . Synovial cyst of wrist 11/10/2014  Left wrist   . Tachy-brady syndrome (HCC) 04/21/2012  Pacemaker is near EOL  . Unspecified constipation 09/13/2012 . Unspecified glaucoma(365.9) 04/28/2012 Past Surgical History: Past Surgical History: Procedure Laterality Date . ANGIOPLASTY  9/11/190  Dr. Katrinka Blazing . APPENDECTOMY   .  BACK SURGERY  03/11/2001 . bilateral knee arthroplasty  09/28/83; 05/17/1992  right then left . CARPAL TUNNEL RELEASE Left  . CARPAL TUNNEL RELEASE Right 2007 . CATARACT EXTRACTION Right 10/30/1996 . CORONARY ARTERY BYPASS GRAFT  2005 . CYSTOSCOPY  08/14/85 . ELBOW SURGERY    repair . EPIDIDYMIS SURGERY  09/17/1985 . HERNIA REPAIR Right 05/19/1976 . PACEMAKER GENERATOR CHANGE  10/29/12  Dr. Johney Frame . PACEMAKER GENERATOR CHANGE N/A 10/29/2012  Procedure: PACEMAKER GENERATOR CHANGE;  Surgeon: Hillis Range, MD;  Location: [facility] CATH LAB;  Service: Cardiovascular;  Laterality: N/A; . PACEMAKER INSERTION  10/14/2003;10/29/2012  MDT Implanted by Dr Amil Amen for SSS; generator change 10/29/2012 by Dr Johney Frame [employer] [place] . REPLACEMENT TOTAL KNEE BILATERAL  1992 and 1996 .  ROTATOR CUFF REPAIR Right 2008 . SHOULDER OPEN ROTATOR CUFF REPAIR Left 02/07/1996 . SIGMOIDOSCOPY  08/26/2001 . TOE SURGERY Right 2005  correction . TRANSURETHRAL RESECTION OF PROSTATE  07/27/1983 . YAG LASER APPLICATION Right 12/26/1973  eye . YAG LASER APPLICATION Right 04/30/2001 . YAG LASER APPLICATION Left 05/07/2001 HPI: 84 y.o. male with medical history significant for PAT/afib not on anticoagulation; pacemaker placement; BPH; HTN; HLD; and CAD presenting with weakness.   He was brought in from his assisted living facility.  Evaluation raised concern for left-sided pneumonia and pleural effusion.  Dx acute renal failure, HCAP.  Remote hx of dysphagia 2013 with MBS finding only mild deficits.  Subjective: drowsy Assessment / Plan / Recommendation CHL IP CLINICAL IMPRESSIONS 11/21/2017 Clinical Impression Pt presents with a significant dysphagia marked by gross aspiration of thin liquids, moderate aspiration of honey-thick liquids.  There was generalized sluggishness of all the necessary components of the swallow. There was reduced oral control/bolus cohesion, leading to spillage of liquids into the larynx and airway before the swallow response was triggered.  As the study progressed, pt's ability to mobilize the larynx/epiglottis improved, but airway protection was ineffective. Pharyngeal contraction was limited, leading to moderate-severe residue in the pharynx.  Pt was eventually able to expectorate a portion of the residue; oral suctioning provided to remove remaining residue.  All consistencies, liquid or solid, pose a high risk for aspiration at this time.  Recommend continued NPO, excluding ice chips, pending family decision about how to proceed.  Attempted to call granddaughter, Noreene Larsson, as requested, and left voice mail message.   SLP Visit Diagnosis Dysphagia, oropharyngeal phase (R13.12) Attention and concentration deficit following -- Frontal lobe and executive function deficit following -- Impact on safety  and function Severe aspiration risk   CHL IP TREATMENT RECOMMENDATION 11/21/2017 Treatment Recommendations Therapy as outlined in treatment plan below   Prognosis 11/21/2017 Prognosis for Safe Diet Advancement Guarded Barriers to Reach Goals -- Barriers/Prognosis Comment -- CHL IP DIET RECOMMENDATION 11/21/2017 SLP Diet Recommendations NPO;Other (Comment) Liquid Administration via -- Medication Administration -- Compensations -- Postural Changes --   CHL IP OTHER RECOMMENDATIONS 11/21/2017 Recommended Consults -- Oral Care Recommendations Oral care QID Other Recommendations --   CHL IP FOLLOW UP RECOMMENDATIONS 11/21/2017 Follow up Recommendations (No Data)   CHL IP FREQUENCY AND DURATION 11/21/2017 Speech Therapy Frequency (ACUTE ONLY) min 2x/week Treatment Duration 1 week      CHL IP ORAL PHASE 11/21/2017 Oral Phase Impaired Oral - Pudding Teaspoon -- Oral - Pudding Cup -- Oral - Honey Teaspoon Weak lingual manipulation;Lingual pumping;Piecemeal swallowing;Decreased bolus cohesion;Premature spillage Oral - Honey Cup -- Oral - Nectar Teaspoon -- Oral - Nectar Cup -- Oral - Nectar Straw -- Oral - Thin Teaspoon -- Oral - Thin  Cup -- Oral - Thin Straw Weak lingual manipulation;Lingual pumping;Piecemeal swallowing;Decreased bolus cohesion;Premature spillage Oral - Puree Weak lingual manipulation;Lingual pumping;Piecemeal swallowing;Decreased bolus cohesion;Premature spillage Oral - Mech Soft -- Oral - Regular -- Oral - Multi-Consistency -- Oral - Pill -- Oral Phase - Comment --  CHL IP PHARYNGEAL PHASE 11/21/2017 Pharyngeal Phase Impaired Pharyngeal- Pudding Teaspoon -- Pharyngeal -- Pharyngeal- Pudding Cup -- Pharyngeal -- Pharyngeal- Honey Teaspoon Delayed swallow initiation-pyriform sinuses;Reduced pharyngeal peristalsis;Reduced epiglottic inversion;Reduced anterior laryngeal mobility;Reduced laryngeal elevation;Reduced airway/laryngeal closure;Reduced tongue base retraction;Penetration/Aspiration before swallow;Moderate  aspiration;Pharyngeal residue - valleculae;Pharyngeal residue - pyriform Pharyngeal Material enters airway, passes BELOW cords and not ejected out despite cough attempt by patient Pharyngeal- Honey Cup NT Pharyngeal -- Pharyngeal- Nectar Teaspoon -- Pharyngeal -- Pharyngeal- Nectar Cup -- Pharyngeal -- Pharyngeal- Nectar Straw -- Pharyngeal -- Pharyngeal- Thin Teaspoon -- Pharyngeal -- Pharyngeal- Thin Cup -- Pharyngeal -- Pharyngeal- Thin Straw Delayed swallow initiation-pyriform sinuses;Reduced pharyngeal peristalsis;Reduced epiglottic inversion;Reduced anterior laryngeal mobility;Reduced laryngeal elevation;Reduced airway/laryngeal closure;Reduced tongue base retraction;Penetration/Aspiration before swallow;Pharyngeal residue - valleculae;Pharyngeal residue - pyriform;Moderate aspiration Pharyngeal Material enters airway, passes BELOW cords and not ejected out despite cough attempt by patient Pharyngeal- Puree Delayed swallow initiation-vallecula;Reduced pharyngeal peristalsis;Reduced epiglottic inversion;Reduced anterior laryngeal mobility;Reduced laryngeal elevation;Reduced tongue base retraction;Pharyngeal residue - valleculae;Pharyngeal residue - pyriform Pharyngeal -- Pharyngeal- Mechanical Soft -- Pharyngeal -- Pharyngeal- Regular -- Pharyngeal -- Pharyngeal- Multi-consistency -- Pharyngeal -- Pharyngeal- Pill -- Pharyngeal -- Pharyngeal Comment --  CHL IP CERVICAL ESOPHAGEAL PHASE 11/21/2017 Cervical Esophageal Phase Impaired Pudding Teaspoon -- Pudding Cup -- Honey Teaspoon -- Honey Cup -- Nectar Teaspoon -- Nectar Cup -- Nectar Straw -- Thin Teaspoon -- Thin Cup -- Thin Straw -- Puree -- Mechanical Soft -- Regular -- Multi-consistency -- Pill -- Cervical Esophageal Comment -- Blenda Mounts Laurice 11/21/2017, 2:37 PM                  Subjective: Patient confused, not able to answer simple questions, no apparent pain or dyspnea.   Discharge Exam: Vitals:   11/23/17 0400 11/23/17 0812  BP: (!)  135/51 (!) 155/71  Pulse: (!) 57 60  Resp:  (!) 40  Temp: (!) 97.5 F (36.4 C) (!) 97.4 F (36.3 C)  SpO2: 93% 97%   Vitals:   11/22/17 1932 11/22/17 2333 11/23/17 0400 11/23/17 0812  BP: (!) 134/103 (!) 173/57 (!) 135/51 (!) 155/71  Pulse: 60 60 (!) 57 60  Resp: (!) 24 (!) 26  (!) 40  Temp: 98.5 F (36.9 C) 98.7 F (37.1 C) (!) 97.5 F (36.4 C) (!) 97.4 F (36.3 C)  TempSrc: Oral Oral Axillary Oral  SpO2: 97% 98% 93% 97%  Weight:      Height:        General: deconditioned  Neurology: somnolent and hyporeactive.  E ENT: no pallor, no icterus, oral mucosa moist Cardiovascular: No JVD. S1-S2 present, rhythmic, no gallops, rubs, or murmurs. No lower extremity edema. Pulmonary: positive breath sounds bilaterally, adequate air movement, no wheezing, rhonchi or rales. Gastrointestinal. Abdomen with no organomegaly, non tender, no rebound or guarding Skin. No rashes Musculoskeletal: no joint deformities   The results of significant diagnostics from this hospitalization (including imaging, microbiology, ancillary and laboratory) are listed below for reference.     Microbiology: Recent Results (from the past 240 hour(s))  Culture, blood (single)     Status: None (Preliminary result)   Collection Time: 11/19/17  2:54 PM  Result Value Ref Range Status   Specimen Description BLOOD RIGHT ANTECUBITAL  Final  Special Requests   Final    BOTTLES DRAWN AEROBIC AND ANAEROBIC Blood Culture results may not be optimal due to an excessive volume of blood received in culture bottles   Culture   Final    NO GROWTH 4 DAYS Performed at [facility], [address] [place], [place] [address]    Report Status PENDING  Incomplete  Urine culture     Status: None   Collection Time: 11/19/17  5:26 PM  Result Value Ref Range Status   Specimen Description URINE, RANDOM  Final   Special Requests NONE  Final   Culture   Final    NO GROWTH Performed at [facility], [address] [place], [place] [address]    Report Status 11/20/2017 FINAL  Final     Labs: BNP (last 3 results) No results for input(s): BNP in the last 8760 hours. Basic Metabolic Panel: Recent Labs  Lab 11/19/17 1150 11/20/17 0351 11/21/17 0243 11/22/17 0243 11/23/17 0422  NA 142 146* 149* 154* 158*  K 4.5 4.2 3.7 3.4* 3.7  CL 112* 115* 121* 126* 129*  CO2 21* 17* 17* 19* 17*  GLUCOSE 179* 128* 138* 139* 148*  BUN 79* 71* 65* 57* 54*  CREATININE 2.39* 2.10* 1.90* 1.65* 1.69*  CALCIUM 8.5* 8.3* 8.3* 8.3* 8.5*   Liver Function Tests: Recent Labs  Lab 11/19/17 1150 11/21/17 0243  AST 56* 54*  ALT 64* 50*  ALKPHOS 135* 104  BILITOT 1.0 1.1  PROT 6.2* 5.9*  ALBUMIN 2.2* 1.9*   Recent Labs  Lab 11/19/17 1150  LIPASE 24   Recent Labs  Lab 11/19/17 1255  AMMONIA 14   CBC: Recent Labs  Lab 11/19/17 1150 11/20/17 0351 11/21/17 0243 11/22/17 0243  WBC 10.4 12.8* 14.6* 16.0*  NEUTROABS 8.9* 10.9*  --  13.5*  HGB 9.6* 9.9* 9.7* 9.9*  HCT 30.4* 31.3* 31.0* 32.0*  MCV 102.7* 102.6* 102.0* 103.2*  PLT 259 271 287 285   Cardiac Enzymes: No results for input(s): CKTOTAL, CKMB, CKMBINDEX, TROPONINI in the last 168 hours. BNP: Invalid input(s): POCBNP CBG: Recent Labs  Lab 11/21/17 2307 11/22/17 0806 11/22/17 1229 11/22/17 1703 11/23/17 0815  GLUCAP 128* 118* 135* 119* 131*   D-Dimer No results for input(s): DDIMER in the last 72 hours. Hgb A1c No results for input(s): HGBA1C in the last 72 hours. Lipid Profile No results for input(s): CHOL, HDL, LDLCALC, TRIG, CHOLHDL, LDLDIRECT in the last 72 hours. Thyroid function studies No results for input(s): TSH, T4TOTAL, T3FREE, THYROIDAB in the last 72 hours.  Invalid input(s): FREET3 Anemia work up No results for input(s): VITAMINB12, FOLATE, FERRITIN, TIBC, IRON, RETICCTPCT in the last 72 hours. Urinalysis    Component Value Date/Time   COLORURINE YELLOW 11/19/2017 1735   APPEARANCEUR CLEAR 11/19/2017 1735    LABSPEC 1.015 11/19/2017 1735   PHURINE 5.0 11/19/2017 1735   GLUCOSEU NEGATIVE 11/19/2017 1735   HGBUR NEGATIVE 11/19/2017 1735   BILIRUBINUR NEGATIVE 11/19/2017 1735   KETONESUR NEGATIVE 11/19/2017 1735   PROTEINUR NEGATIVE 11/19/2017 1735   UROBILINOGEN 0.2 04/24/2012 1122   NITRITE NEGATIVE 11/19/2017 1735   LEUKOCYTESUR NEGATIVE 11/19/2017 1735   Sepsis Labs Invalid input(s): PROCALCITONIN,  WBC,  LACTICIDVEN Microbiology Recent Results (from the past 240 hour(s))  Culture, blood (single)     Status: None (Preliminary result)   Collection Time: 11/19/17  2:54 PM  Result Value Ref Range Status   Specimen Description BLOOD RIGHT ANTECUBITAL  Final  Special Requests   Final    BOTTLES DRAWN AEROBIC AND ANAEROBIC Blood Culture results may not be optimal due to an excessive volume of blood received in culture bottles   Culture   Final    NO GROWTH 4 DAYS Performed at [facility], [address], [place], [place] [address]    Report Status PENDING  Incomplete  Urine culture     Status: None   Collection Time: 11/19/17  5:26 PM  Result Value Ref Range Status   Specimen Description URINE, RANDOM  Final   Special Requests NONE  Final   Culture   Final    NO GROWTH Performed at [facility], [address], [place], [place] [address]    Report Status 11/20/2017 FINAL  Final     Time coordinating discharge: 45 minutes  SIGNED:   Coralie Keens, MD  [facility] 11/23/2017, 10:30 AM Pager [phone]  If 7PM-7AM, please contact night-coverage [email or web address] Password [email or web address]

## 2017-11-23 NOTE — Progress Notes (Signed)
PT Cancellation/Discharge Note  Patient Details Name: Caleb Nash MRN: [id number] DOB: 01-Apr-1914   Cancelled Treatment:    Reason Eval/Treat Not Completed: Other (comment), spoke with RN who confirms pt for comfort measures.  Will sign off.    Elray McgregorCynthia Brieonna Crutcher 11/23/2017, 12:08 PM  Sheran Lawlessyndi Kiandria Clum, PT [phone] 11/23/2017

## 2017-11-23 NOTE — Progress Notes (Signed)
SLP Cancellation Note  Patient Details Name: Tobe Sosrthur W Dillon MRN: [id number] DOB: 01/02/1914   Cancelled treatment:       Reason Eval/Treat Not Completed:  Pt and family report that they wish for pt to be comfort care at this time.  Medical team has initiated diet of dys 1 textures and thin liquids to allow some POs for pleasure.  ST to sign off at this time as no further needs are identified.     Melizza Kanode, Melanee SpryNicole L 11/23/2017, 11:03 AM

## 2017-11-24 LAB — CULTURE, BLOOD (SINGLE): Culture: NO GROWTH

## 2017-11-26 ENCOUNTER — Telehealth: Payer: Self-pay

## 2017-11-26 NOTE — Telephone Encounter (Signed)
Possible re-admission to facility. [facility] is needed if patient was re-admitted to facility upon discharge[facility]  [facility].

## 2017-12-23 DEATH — deceased
